# Patient Record
Sex: Female | Born: 1975 | Race: White | Hispanic: No | Smoking: Former smoker
Health system: Southern US, Community
[De-identification: ages and names within clinical notes are randomized; demographics above are authoritative.]

## PROBLEM LIST (undated history)

## (undated) DIAGNOSIS — M549 Dorsalgia, unspecified: Secondary | ICD-10-CM

## (undated) DIAGNOSIS — C801 Malignant (primary) neoplasm, unspecified: Secondary | ICD-10-CM

## (undated) DIAGNOSIS — F419 Anxiety disorder, unspecified: Secondary | ICD-10-CM

## (undated) HISTORY — PX: CHOLECYSTECTOMY: SHX55

## (undated) HISTORY — PX: ORTHOPEDIC SURGERY: SHX850

## (undated) MED FILL — Dexamethasone Sodium Phosphate Inj 100 MG/10ML: INTRAMUSCULAR | Qty: 1 | Status: AC

---

## 1998-10-20 ENCOUNTER — Emergency Department (HOSPITAL_COMMUNITY): Admission: EM | Admit: 1998-10-20 | Discharge: 1998-10-20 | Payer: Self-pay | Admitting: Emergency Medicine

## 1998-10-20 ENCOUNTER — Encounter: Payer: Self-pay | Admitting: Emergency Medicine

## 2000-11-21 ENCOUNTER — Inpatient Hospital Stay (HOSPITAL_COMMUNITY): Admission: EM | Admit: 2000-11-21 | Discharge: 2000-11-24 | Payer: Self-pay | Admitting: Family Medicine

## 2001-07-22 ENCOUNTER — Emergency Department (HOSPITAL_COMMUNITY): Admission: EM | Admit: 2001-07-22 | Discharge: 2001-07-22 | Payer: Self-pay | Admitting: Emergency Medicine

## 2001-09-01 ENCOUNTER — Encounter: Payer: Self-pay | Admitting: Internal Medicine

## 2001-09-01 ENCOUNTER — Inpatient Hospital Stay (HOSPITAL_COMMUNITY): Admission: EM | Admit: 2001-09-01 | Discharge: 2001-09-04 | Payer: Self-pay | Admitting: *Deleted

## 2001-09-02 ENCOUNTER — Encounter: Payer: Self-pay | Admitting: Family Medicine

## 2002-03-20 ENCOUNTER — Emergency Department (HOSPITAL_COMMUNITY): Admission: EM | Admit: 2002-03-20 | Discharge: 2002-03-20 | Payer: Self-pay

## 2002-07-07 ENCOUNTER — Emergency Department (HOSPITAL_COMMUNITY): Admission: EM | Admit: 2002-07-07 | Discharge: 2002-07-08 | Payer: Self-pay | Admitting: Emergency Medicine

## 2003-01-19 ENCOUNTER — Observation Stay (HOSPITAL_COMMUNITY): Admission: RE | Admit: 2003-01-19 | Discharge: 2003-01-20 | Payer: Self-pay | Admitting: Obstetrics & Gynecology

## 2003-01-20 ENCOUNTER — Encounter: Payer: Self-pay | Admitting: Obstetrics & Gynecology

## 2003-01-21 ENCOUNTER — Emergency Department (HOSPITAL_COMMUNITY): Admission: EM | Admit: 2003-01-21 | Discharge: 2003-01-21 | Payer: Self-pay | Admitting: Emergency Medicine

## 2003-01-23 ENCOUNTER — Ambulatory Visit (HOSPITAL_COMMUNITY): Admission: AD | Admit: 2003-01-23 | Discharge: 2003-01-23 | Payer: Self-pay | Admitting: Obstetrics and Gynecology

## 2003-03-01 ENCOUNTER — Ambulatory Visit (HOSPITAL_COMMUNITY): Admission: AD | Admit: 2003-03-01 | Discharge: 2003-03-01 | Payer: Self-pay | Admitting: Obstetrics and Gynecology

## 2003-03-02 ENCOUNTER — Inpatient Hospital Stay (HOSPITAL_COMMUNITY): Admission: RE | Admit: 2003-03-02 | Discharge: 2003-03-04 | Payer: Self-pay | Admitting: Obstetrics and Gynecology

## 2004-05-25 ENCOUNTER — Emergency Department (HOSPITAL_COMMUNITY): Admission: EM | Admit: 2004-05-25 | Discharge: 2004-05-25 | Payer: Self-pay | Admitting: Emergency Medicine

## 2004-05-28 ENCOUNTER — Emergency Department (HOSPITAL_COMMUNITY): Admission: EM | Admit: 2004-05-28 | Discharge: 2004-05-28 | Payer: Self-pay | Admitting: *Deleted

## 2005-01-29 ENCOUNTER — Ambulatory Visit: Payer: Self-pay | Admitting: Family Medicine

## 2005-10-19 ENCOUNTER — Emergency Department (HOSPITAL_COMMUNITY): Admission: EM | Admit: 2005-10-19 | Discharge: 2005-10-19 | Payer: Self-pay | Admitting: Emergency Medicine

## 2006-04-06 ENCOUNTER — Inpatient Hospital Stay (HOSPITAL_COMMUNITY): Admission: RE | Admit: 2006-04-06 | Discharge: 2006-04-08 | Payer: Self-pay | Admitting: Obstetrics and Gynecology

## 2006-06-02 ENCOUNTER — Emergency Department (HOSPITAL_COMMUNITY): Admission: EM | Admit: 2006-06-02 | Discharge: 2006-06-02 | Payer: Self-pay | Admitting: Emergency Medicine

## 2006-12-11 ENCOUNTER — Emergency Department (HOSPITAL_COMMUNITY): Admission: EM | Admit: 2006-12-11 | Discharge: 2006-12-11 | Payer: Self-pay | Admitting: Emergency Medicine

## 2006-12-29 ENCOUNTER — Emergency Department (HOSPITAL_COMMUNITY): Admission: EM | Admit: 2006-12-29 | Discharge: 2006-12-29 | Payer: Self-pay | Admitting: Emergency Medicine

## 2007-05-04 ENCOUNTER — Emergency Department (HOSPITAL_COMMUNITY): Admission: EM | Admit: 2007-05-04 | Discharge: 2007-05-04 | Payer: Self-pay | Admitting: Emergency Medicine

## 2007-05-05 ENCOUNTER — Emergency Department (HOSPITAL_COMMUNITY): Admission: EM | Admit: 2007-05-05 | Discharge: 2007-05-05 | Payer: Self-pay | Admitting: Emergency Medicine

## 2007-08-19 ENCOUNTER — Encounter: Payer: Self-pay | Admitting: Family Medicine

## 2008-01-16 ENCOUNTER — Emergency Department (HOSPITAL_COMMUNITY): Admission: EM | Admit: 2008-01-16 | Discharge: 2008-01-16 | Payer: Self-pay | Admitting: Emergency Medicine

## 2008-02-10 ENCOUNTER — Ambulatory Visit (HOSPITAL_COMMUNITY): Admission: RE | Admit: 2008-02-10 | Discharge: 2008-02-10 | Payer: Self-pay | Admitting: Neurology

## 2008-02-20 ENCOUNTER — Emergency Department (HOSPITAL_COMMUNITY): Admission: EM | Admit: 2008-02-20 | Discharge: 2008-02-20 | Payer: Self-pay | Admitting: Emergency Medicine

## 2008-08-13 ENCOUNTER — Emergency Department (HOSPITAL_COMMUNITY): Admission: EM | Admit: 2008-08-13 | Discharge: 2008-08-13 | Payer: Self-pay | Admitting: Emergency Medicine

## 2009-05-08 ENCOUNTER — Emergency Department (HOSPITAL_COMMUNITY): Admission: EM | Admit: 2009-05-08 | Discharge: 2009-05-08 | Payer: Self-pay | Admitting: Emergency Medicine

## 2009-07-02 ENCOUNTER — Emergency Department (HOSPITAL_COMMUNITY): Admission: EM | Admit: 2009-07-02 | Discharge: 2009-07-02 | Payer: Self-pay | Admitting: Emergency Medicine

## 2010-09-07 ENCOUNTER — Encounter: Payer: Self-pay | Admitting: Orthopedic Surgery

## 2010-09-08 ENCOUNTER — Encounter: Payer: Self-pay | Admitting: Obstetrics and Gynecology

## 2010-09-08 ENCOUNTER — Encounter: Payer: Self-pay | Admitting: Neurology

## 2010-09-17 NOTE — Letter (Signed)
Summary: rpc chart  rpc chart   Imported By: Curtis Sites 05/27/2010 11:23:27  _____________________________________________________________________  External Attachment:    Type:   Image     Comment:   External Document

## 2010-11-14 ENCOUNTER — Emergency Department (HOSPITAL_COMMUNITY)
Admission: EM | Admit: 2010-11-14 | Discharge: 2010-11-15 | Disposition: A | Discharge status: Home / Self Care | Attending: Emergency Medicine | Admitting: Emergency Medicine

## 2010-11-14 DIAGNOSIS — IMO0002 Reserved for concepts with insufficient information to code with codable children: Secondary | ICD-10-CM | POA: Insufficient documentation

## 2010-11-14 DIAGNOSIS — K08409 Partial loss of teeth, unspecified cause, unspecified class: Secondary | ICD-10-CM | POA: Insufficient documentation

## 2010-11-14 DIAGNOSIS — K08109 Complete loss of teeth, unspecified cause, unspecified class: Secondary | ICD-10-CM | POA: Insufficient documentation

## 2010-11-14 DIAGNOSIS — Y849 Medical procedure, unspecified as the cause of abnormal reaction of the patient, or of later complication, without mention of misadventure at the time of the procedure: Secondary | ICD-10-CM | POA: Insufficient documentation
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
## 2011-01-03 NOTE — Discharge Summary (Signed)
NAME:  Sara [name], Sara [name] NO.:  [id number]   MEDICAL RECORD NO.:  [id number]          PATIENT TYPE:  INP   LOCATION:  A413                          FACILITY:  [facility]   PHYSICIAN:  Tilda Burrow, M.D. DATE OF BIRTH:  1975/09/15   DATE OF ADMISSION:  04/06/2006  DATE OF DISCHARGE:  08/22/2007LH                                 DISCHARGE SUMMARY   ADMITTING DIAGNOSIS:  1. Pregnancy, term.  2. Active labor.  3. Addictive personality.  4. Positive cocaine urine drug screen.  5. Long-term use of Klonopin and Xanax (benzodiazepine).  6. Long-term use of Tylox at 90 per month.   DISCHARGE DIAGNOSES:  04/09/2006  1. Pregnancy, term, delivery.  2. Positive urine drug screen for cocaine.  3. Addictive personality.  4. Long-term use of benzodiazepines.   DISCHARGE MEDICATIONS:  1. Klonopin 1 mg twice daily times 30 days.  2. Lortab 7.5 times 30 tablets one every 6 hours p.r.n. pain.  3. Motrin 800 mg 30 tablets one every 8 hours for cramps.   FOLLOWUP:  One week Depo-Provera shot and then four weeks postpartum.  Follow up one week for tubal ligation papers.   HOSPITAL SUMMARY:  This 35 year old female, gravida 2, para 1, AB 0, is  admitted for labor management, early on August 20 at 3:49 a.m.  See Dr.  Forestine Chute notes for admitting status.   HOSPITAL COURSE:  The patient was admitted and progressed within a few hours  to delivery at 6 a.m. by Dr. Turner Daniels.   Postpartum course was notable for the fact that the urine drug screen once  again returned positive for cocaine.  She was confronted on this and social  services was involved.  Additionally, the patient has had an addictive  personality for years.  We have had multiple discussions over this during  the pregnancy.  At this point in time, we are eliminating use of oxycodone  (Tylox), which has been managed on 3 tablets 5 mg three times a day over the  past months of pregnancy.  We will prescribe NO more Tylox.  We  will manage  the discomforts of postpartum state with Motrin 800 mg three times a day 30  tablets to deal with uterine cramps and a total of 30 Lortab 7.5, which will  get her through any of the uterine cramping discomfort, postpartum.  After  that, she will only receive Motrin and no Lortab will be administered.   Additionally, we will not prescribe any more Xanax for her.  (The patient  has been on Xanax since her teens with a life-long addiction/dependency.)  We will use Klonopin 1 mg twice daily for 30 days.  Since [name] has used  Dr. Omelia Blackwater  in the past and we will send info to him and to [facility] to ahelp coordinate future medical care, with goal of reduced  drug use communicated.  Social services, of course, is involved regarding the child's care.   Additionally, the patient plans to have her tubes tied and will sign tubal  ligation papers within a week, decide  whether to have her tubes tied at one  month or six months and, if she does decide to delay, will hopefully use  Depo-Provera as interval contraception.    Addendum: Discharge delayed til 04/09/2006 due to pediatric concerns jvf      Tilda Burrow, M.D.  Electronically Signed     JVF/MEDQ  D:  04/08/2006  T:  04/08/2006  Job:  [id number]   cc:   Damita Dunnings, Dr.  Evlyn Kanner [address]  [place]

## 2011-01-03 NOTE — Op Note (Signed)
NAME:  Sara [name], Sara [name] NO.:  [id number]   MEDICAL RECORD NO.:  [id number]          PATIENT TYPE:  INP   LOCATION:  A413                          FACILITY:  [facility]   PHYSICIAN:  Lazaro Arms, M.D.   DATE OF BIRTH:  [birth year]   DATE OF PROCEDURE:  04/06/2006  DATE OF DISCHARGE:  04/08/2006                                 OPERATIVE REPORT   [name] progressed quickly to the active phase of labor.  She was positive  for cocaine.  Over an intact perineum, she delivered a viable female infant  with Apgars 9 and 9 weighing 7 pounds 2 ounces, three vessel cord, loose  nuchal cord x1.  The infant underwent routine neonatal resuscitation.  Cord  blood and cord gas sent.  Placenta was delivered normal and intact.  The  uterus was firm below the umbilicus.  There was 350 mL of bleeding with the  delivery.  The epidural catheter was removed intact without difficulty.  The  patient will undergo routine postpartum care.      Lazaro Arms, M.D.  Electronically Signed     LHE/MEDQ  D:  04/30/2006  T:  04/30/2006  Job:  [id number]

## 2011-01-03 NOTE — Op Note (Signed)
   NAME:  Sara [name], Sara [name] NO.:  [id number]   MEDICAL RECORD NO.:  [id number]                  PATIENT TYPE:   LOCATION:                                       FACILITY:   PHYSICIAN:  Jacklyn Shell, C.N.M.    DATE OF BIRTH:   DATE OF PROCEDURE:  DATE OF DISCHARGE:                                 OPERATIVE REPORT   DELIVERY NOTE:  After a small amount of Pitocin augmentation [name] was  noted to be fully dilated with an urge to push.  After an approximate hour  second stage she delivered a viable female infant at 1350.  The mouth and  noses were suctioned on the perineum with a bulb syringe and the body  delivered easily over the next push.  Weight is 6 pounds 9 ounces; Apgars  are 8 and 9.   The placenta separated spontaneously and was delivered by a controlled cord  traction at 1352.  Twenty units of Pitocin diluted in 1000 cc of lactated  Ringers was infused rapidly IV.  The fundus was immediately firm and minimal  blood loss was noted.  The vagina was then inspected and no lacerations were  found.  The epidural catheter was then removed with the blue tip being  visualized as intact. Estimated blood loss 200 cc.                                               Jacklyn Shell, C.N.M.    FC/MEDQ  D:  03/02/2003  T:  03/02/2003  Job:  [id number]   cc:   [facility]

## 2011-01-03 NOTE — Op Note (Signed)
NAME:  Sara [name], Sara [name] NO.:  [id number]   MEDICAL RECORD NO.:  [id number]          PATIENT TYPE:  INP   LOCATION:  A413                          FACILITY:  [facility]   PHYSICIAN:  Lazaro Arms, M.D.   DATE OF BIRTH:  1975-10-31   DATE OF PROCEDURE:  04/06/2006  DATE OF DISCHARGE:  04/08/2006                                 OPERATIVE REPORT   PROCEDURE:  Epidural.   [name] was admitted in the active phase of labor requesting epidural be  placed.  She was placed in the sitting position, Betadine prep was used.  1%  lidocaine was injected in the L3-L4 interspace.  The area was draped.  A 17  gauge Tuohy needle was used.  Loss of resistance technique employed.  The  epidural space was found on one pass without difficulty.  10 mL of 0.125%  Bupivacaine plain was given to the epidural space without difficulty.  The  epidural catheter was fit 5 cm in the epidural space, an additional 10 mL  was given, this was taped down and continuous infusion begun at 12 mL per  hour.  The patient tolerated the procedure well.  She is getting good pain  relief.  No adverse reaction.      Lazaro Arms, M.D.  Electronically Signed     LHE/MEDQ  D:  04/30/2006  T:  04/30/2006  Job:  [id number]
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
## 2011-01-03 NOTE — Op Note (Signed)
   NAME:  Sara [name], Sara [name]                         ACCOUNT NO.:  [id number]   MEDICAL RECORD NO.:  [id number]                   PATIENT TYPE:  INP   LOCATION:  A418                                 FACILITY:  [facility]   PHYSICIAN:  Tilda Burrow, M.D.              DATE OF BIRTH:  31-May-1976   DATE OF PROCEDURE:  03/02/2003  DATE OF DISCHARGE:                                 OPERATIVE REPORT   PROCEDURE:  Epidural catheter placement.   INDICATIONS FOR PROCEDURE:  The patient requesting epidural for labor  management.   SURGEON:  Lazaro Arms, M.D.   TIME:  03/02/2003, 7 a.m.   DESCRIPTION OF PROCEDURE:  The patient was placed in a sitting position  flexed forward and epidural catheter placed using loss of resistance  technique at the L3-4 interspace.  She received an initial 10 cc test dose  of bupivacaine 0.25% followed by subsequent dose similar.  The initial bolus  was 7 cc followed by continuous infusion at 12 cc per hour.  The patient did  well, with stable blood pressures.  The catheter was taped to her back and  was allowed to proceed without difficulty.                                                 Tilda Burrow, M.D.    JVF/MEDQ  D:  03/14/2003  T:  03/15/2003  Job:  [id number]
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
## 2011-01-10 ENCOUNTER — Emergency Department (HOSPITAL_COMMUNITY)
Admission: EM | Admit: 2011-01-10 | Discharge: 2011-01-11 | Disposition: A | Discharge status: Home / Self Care | Attending: Emergency Medicine | Admitting: Emergency Medicine

## 2011-01-10 DIAGNOSIS — F411 Generalized anxiety disorder: Secondary | ICD-10-CM | POA: Insufficient documentation

## 2011-01-10 DIAGNOSIS — F329 Major depressive disorder, single episode, unspecified: Secondary | ICD-10-CM | POA: Insufficient documentation

## 2011-01-10 DIAGNOSIS — K029 Dental caries, unspecified: Secondary | ICD-10-CM | POA: Insufficient documentation

## 2011-01-10 DIAGNOSIS — S025XXA Fracture of tooth (traumatic), initial encounter for closed fracture: Secondary | ICD-10-CM | POA: Insufficient documentation

## 2011-01-10 DIAGNOSIS — F3289 Other specified depressive episodes: Secondary | ICD-10-CM | POA: Insufficient documentation

## 2011-01-10 DIAGNOSIS — X58XXXA Exposure to other specified factors, initial encounter: Secondary | ICD-10-CM | POA: Insufficient documentation

## 2011-05-02 ENCOUNTER — Emergency Department (HOSPITAL_COMMUNITY)
Admission: EM | Admit: 2011-05-02 | Discharge: 2011-05-02 | Disposition: A | Discharge status: Home / Self Care | Attending: Emergency Medicine | Admitting: Emergency Medicine

## 2011-05-02 ENCOUNTER — Encounter: Payer: Self-pay | Admitting: *Deleted

## 2011-05-02 DIAGNOSIS — M62838 Other muscle spasm: Secondary | ICD-10-CM | POA: Insufficient documentation

## 2011-05-02 DIAGNOSIS — M549 Dorsalgia, unspecified: Secondary | ICD-10-CM | POA: Insufficient documentation

## 2011-05-02 DIAGNOSIS — IMO0002 Reserved for concepts with insufficient information to code with codable children: Secondary | ICD-10-CM | POA: Insufficient documentation

## 2011-05-02 DIAGNOSIS — Y92009 Unspecified place in unspecified non-institutional (private) residence as the place of occurrence of the external cause: Secondary | ICD-10-CM | POA: Insufficient documentation

## 2011-05-02 HISTORY — DX: Dorsalgia, unspecified: M54.9

## 2011-05-02 MED ORDER — OXYCODONE-ACETAMINOPHEN 5-325 MG PO TABS: 1.0000 | ORAL_TABLET | Freq: Once | ORAL | Status: DC

## 2011-05-02 MED ORDER — CYCLOBENZAPRINE HCL 10 MG PO TABS
10.0000 mg | ORAL_TABLET | Freq: Once | ORAL | Status: AC
  Administered 2011-05-02: 10 mg via ORAL
  Filled 2011-05-02: qty 1

## 2011-05-02 MED ORDER — OXYCODONE-ACETAMINOPHEN 5-325 MG PO TABS
1.0000 | ORAL_TABLET | Freq: Once | ORAL | Status: AC
  Administered 2011-05-02: 1 via ORAL
  Filled 2011-05-02: qty 1

## 2011-05-02 MED ORDER — OXYCODONE-ACETAMINOPHEN 7.5-325 MG PO TABS: ORAL_TABLET | ORAL | Status: DC

## 2011-05-02 MED ORDER — CYCLOBENZAPRINE HCL 10 MG PO TABS: ORAL_TABLET | ORAL | Status: DC
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
## 2011-05-02 NOTE — ED Notes (Signed)
States she fell last night in the bathtub, has a history of back pain
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
## 2011-05-03 NOTE — ED Provider Notes (Signed)
Medical screening examination/treatment/procedure(s) were performed by non-physician practitioner and as supervising physician I was immediately available for consultation/collaboration.  Chawn Spraggins, MD 05/03/11 0008 

## 2011-05-15 ENCOUNTER — Encounter (HOSPITAL_COMMUNITY): Payer: Self-pay | Admitting: *Deleted

## 2011-05-15 ENCOUNTER — Emergency Department (HOSPITAL_COMMUNITY)
Admission: EM | Admit: 2011-05-15 | Discharge: 2011-05-15 | Disposition: A | Discharge status: Home / Self Care | Attending: Emergency Medicine | Admitting: Emergency Medicine

## 2011-05-15 DIAGNOSIS — R070 Pain in throat: Secondary | ICD-10-CM | POA: Insufficient documentation

## 2011-05-15 DIAGNOSIS — J3489 Other specified disorders of nose and nasal sinuses: Secondary | ICD-10-CM | POA: Insufficient documentation

## 2011-05-15 DIAGNOSIS — M545 Low back pain: Secondary | ICD-10-CM

## 2011-05-15 DIAGNOSIS — R509 Fever, unspecified: Secondary | ICD-10-CM | POA: Insufficient documentation

## 2011-05-15 DIAGNOSIS — K029 Dental caries, unspecified: Secondary | ICD-10-CM | POA: Insufficient documentation

## 2011-05-15 DIAGNOSIS — M549 Dorsalgia, unspecified: Secondary | ICD-10-CM | POA: Insufficient documentation

## 2011-05-15 DIAGNOSIS — K0889 Other specified disorders of teeth and supporting structures: Secondary | ICD-10-CM

## 2011-05-15 DIAGNOSIS — K089 Disorder of teeth and supporting structures, unspecified: Secondary | ICD-10-CM | POA: Insufficient documentation

## 2011-05-15 DIAGNOSIS — J029 Acute pharyngitis, unspecified: Secondary | ICD-10-CM

## 2011-05-15 DIAGNOSIS — F17200 Nicotine dependence, unspecified, uncomplicated: Secondary | ICD-10-CM | POA: Insufficient documentation

## 2011-05-15 MED ORDER — PROMETHAZINE-DM 6.25-15 MG/5ML PO SYRP: 5.0000 mL | ORAL_SOLUTION | Freq: Four times a day (QID) | ORAL | Status: AC | PRN

## 2011-05-15 MED ORDER — AMOXICILLIN 500 MG PO CAPS: 500.0000 mg | ORAL_CAPSULE | Freq: Three times a day (TID) | ORAL | Status: AC

## 2011-05-15 MED ORDER — OXYCODONE-ACETAMINOPHEN 5-325 MG PO TABS
1.0000 | ORAL_TABLET | Freq: Once | ORAL | Status: AC
  Administered 2011-05-15: 1 via ORAL
  Filled 2011-05-15: qty 1

## 2011-05-15 MED ORDER — OXYCODONE-ACETAMINOPHEN 5-325 MG PO TABS: 1.0000 | ORAL_TABLET | ORAL | Status: AC | PRN

## 2011-05-15 NOTE — ED Provider Notes (Signed)
History     CSN: [id number] Arrival date & time: 05/15/2011  4:44 PM  Chief Complaint  Patient presents with  . Sore Throat  . Dental Pain    wisdom teeth bottom bilateral    (Consider location/radiation/quality/duration/timing/severity/associated sxs/prior treatment) Patient is a 35 y.o. female presenting with pharyngitis and tooth pain. The history is provided by the patient.  Sore Throat This is a new problem. The current episode started in the past 7 days. The problem occurs constantly. The problem has been unchanged. Associated symptoms include congestion, coughing, a fever and a sore throat. Pertinent negatives include no abdominal pain, arthralgias, chest pain, headaches, joint swelling, nausea, neck pain, numbness, rash or weakness. Associated symptoms comments: Nasal congestion with purulent nasal discharge.  She also reports chronic low back pain and is getting established with a pain clinic,  But will not be seen until 3 weeks from now.  Cough has been nonproductive.  She feels achy allover,  Denies sob.  No complaint of lower extremity weakness or numbness.  She also complains of dental pain,  Has decay in her bilateral lower wisdom teeth with increased pain and swelling on the right side of her mouth.. The symptoms are aggravated by nothing.  Dental PainThe primary symptoms include fever, sore throat and cough. Primary symptoms do not include headaches or shortness of breath.    Past Medical History  Diagnosis Date  . Back pain     Past Surgical History  Procedure Date  . Orthopedic surgery   . Cholecystectomy     History reviewed. No pertinent family history.  History  Substance Use Topics  . Smoking status: Current Everyday Smoker  . Smokeless tobacco: Not on file  . Alcohol Use: No    OB History    Grav Para Term Preterm Abortions TAB SAB Ect Mult Living                  Review of Systems  Constitutional: Positive for fever.  HENT: Positive for  congestion, sore throat, rhinorrhea and dental problem. Negative for neck pain and sinus pressure.   Eyes: Negative.   Respiratory: Positive for cough. Negative for chest tightness and shortness of breath.   Cardiovascular: Negative for chest pain.  Gastrointestinal: Negative for nausea and abdominal pain.  Genitourinary: Negative.   Musculoskeletal: Positive for back pain. Negative for joint swelling and arthralgias.  Skin: Negative.  Negative for rash and wound.  Neurological: Negative for dizziness, weakness, light-headedness, numbness and headaches.  Hematological: Negative.   Psychiatric/Behavioral: Negative.     Allergies  Hydrocodone; Sulfa antibiotics; and Morphine and related  Home Medications   Current Outpatient Rx  Name Route Sig Dispense Refill  . CYCLOBENZAPRINE HCL 10 MG PO TABS  1/2 to 1 tab po TID for muscle spasms. 30 tablet 0  . IBUPROFEN 800 MG PO TABS Oral Take 800 mg by mouth daily as needed. For pain     . ONE-DAILY MULTI VITAMINS PO TABS Oral Take 1 tablet by mouth daily.      . OXYCODONE-ACETAMINOPHEN 7.5-325 MG PO TABS  1 po q 6 hrs prn pain 30 tablet 0    BP 104/67  Pulse 95  Temp(Src) 98.4 F (36.9 C) (Oral)  Resp 20  Ht 5\' 4"  (1.626 m)  Wt 121 lb (54.885 kg)  BMI 20.77 kg/m2  SpO2 100%  LMP 04/27/2011  Physical Exam  Constitutional: She is oriented to person, place, and time. She appears well-developed and well-nourished. No distress.  HENT:  Head: Normocephalic and atraumatic.  Right Ear: Tympanic membrane and external ear normal.  Left Ear: Tympanic membrane and external ear normal.  Mouth/Throat: Mucous membranes are normal. No oral lesions. Dental caries present. Posterior oropharyngeal erythema present. No oropharyngeal exudate or tonsillar abscesses.         Bilateral lower 3rd molar decay with surrounding gingival erythema of the right molar.  No fluctuance.  Eyes: Conjunctivae are normal.  Neck: Normal range of motion. Neck supple.    Cardiovascular: Normal rate, regular rhythm, normal heart sounds and intact distal pulses.        Pedal pulses normal.  Pulmonary/Chest: Effort normal. She has no wheezes.  Abdominal: Soft. Bowel sounds are normal. She exhibits no distension and no mass.  Musculoskeletal: Normal range of motion. She exhibits no edema.       Lumbar back: She exhibits tenderness. She exhibits no swelling, no edema and no spasm.  Lymphadenopathy:    She has no cervical adenopathy.  Neurological: She is alert and oriented to person, place, and time. She has normal strength. She displays no atrophy and no tremor. No cranial nerve deficit or sensory deficit. Gait normal.  Reflex Scores:      Patellar reflexes are 2+ on the right side and 2+ on the left side.      Achilles reflexes are 2+ on the right side and 2+ on the left side.      No strength deficit noted in hip and knee flexor and extensor muscle groups.  Ankle flexion and extension intact.  Skin: Skin is warm and dry. No erythema.  Psychiatric: She has a normal mood and affect.    ED Course  Procedures (including critical care time)   Labs Reviewed  RAPID STREP SCREEN   No results found.   No diagnosis found.    MDM  Dental pain with decay,  probably early abscess right lower 3rd molar,  Covered with abx.          Candis Musa, PA 05/15/11 1802

## 2011-05-15 NOTE — ED Provider Notes (Signed)
Evaluation and management procedures were performed by the mid-level provider (PA/NP/CNM) under my supervision/collaboration. I was present and available during the ED course. Keyontae Huckeby Y.   Gavin Pound. Oletta Lamas, MD 05/15/11 [phone]

## 2011-05-15 NOTE — ED Notes (Signed)
Pt c/o sore throat and bilateral wisdom tooth pain

## 2011-05-15 NOTE — ED Notes (Signed)
Pt out at desk asking for percocet's, 7.5mg  and 30 of them. States does not want to withdrawal until she gets to the pain clinic.

## 2011-05-29 LAB — CULTURE, ROUTINE-ABSCESS

## 2011-06-22 ENCOUNTER — Encounter (HOSPITAL_COMMUNITY): Payer: Self-pay

## 2011-06-22 ENCOUNTER — Emergency Department (HOSPITAL_COMMUNITY)
Admission: EM | Admit: 2011-06-22 | Discharge: 2011-06-22 | Disposition: A | Discharge status: Home / Self Care | Attending: Emergency Medicine | Admitting: Emergency Medicine

## 2011-06-22 DIAGNOSIS — K0889 Other specified disorders of teeth and supporting structures: Secondary | ICD-10-CM

## 2011-06-22 DIAGNOSIS — K029 Dental caries, unspecified: Secondary | ICD-10-CM | POA: Insufficient documentation

## 2011-06-22 DIAGNOSIS — F17200 Nicotine dependence, unspecified, uncomplicated: Secondary | ICD-10-CM | POA: Insufficient documentation

## 2011-06-22 DIAGNOSIS — K089 Disorder of teeth and supporting structures, unspecified: Secondary | ICD-10-CM | POA: Insufficient documentation

## 2011-06-22 MED ORDER — OXYCODONE-ACETAMINOPHEN 5-325 MG PO TABS: 1.0000 | ORAL_TABLET | ORAL | Status: AC | PRN

## 2011-06-22 MED ORDER — PENICILLIN V POTASSIUM 250 MG PO TABS
500.0000 mg | ORAL_TABLET | Freq: Once | ORAL | Status: AC
  Administered 2011-06-22: 500 mg via ORAL
  Filled 2011-06-22: qty 2

## 2011-06-22 MED ORDER — PENICILLIN V POTASSIUM 500 MG PO TABS: 500.0000 mg | ORAL_TABLET | Freq: Four times a day (QID) | ORAL | Status: AC

## 2011-06-22 MED ORDER — OXYCODONE-ACETAMINOPHEN 5-325 MG PO TABS
1.0000 | ORAL_TABLET | Freq: Once | ORAL | Status: AC
  Administered 2011-06-22: 1 via ORAL
  Filled 2011-06-22: qty 1

## 2011-06-22 NOTE — ED Provider Notes (Signed)
Medical screening examination/treatment/procedure(s) were performed by non-physician practitioner and as supervising physician I was immediately available for consultation/collaboration.   Glynn Octave, MD 06/22/11 2329

## 2011-06-22 NOTE — ED Notes (Signed)
Pt presents with lower wisdom teeth pain. Pt states pain is intermittent for 1 month.

## 2011-06-22 NOTE — ED Notes (Signed)
Pt states was eating lunch when she "stuck a plastic fork in tooth ( has 2 lower back molar caries) to get food particle from cavity" " I dumped 2 bc powders in teeth, 1 in both sides".

## 2011-06-22 NOTE — ED Provider Notes (Signed)
History     CSN: [id number] Arrival date & time: 06/22/2011  3:40 PM   First MD Initiated Contact with Patient 06/22/11 1610      Chief Complaint  Patient presents with  . Dental Pain    (Consider location/radiation/quality/duration/timing/severity/associated sxs/prior treatment) Patient is a 34 y.o. female presenting with tooth pain. The history is provided by the patient.  Dental PainThe primary symptoms include mouth pain. Primary symptoms do not include oral bleeding, oral lesions, headaches, fever, shortness of breath, sore throat, angioedema or cough. The symptoms began more than 1 week ago. The symptoms are waxing and waning. The symptoms are chronic. The symptoms occur frequently.  Mouth pain began more than 1 week ago. Mouth pain occurs frequently. Mouth pain is worsening. Affected locations include: teeth. The mouth pain is currently at 9/10.  Additional symptoms include: dental sensitivity to temperature, jaw pain and facial swelling. Additional symptoms do not include: gum swelling, gum tenderness, purulent gums, trismus, trouble swallowing, pain with swallowing, ear pain, swollen glands and fatigue. Medical issues include: periodontal disease.    Past Medical History  Diagnosis Date  . Back pain     Past Surgical History  Procedure Date  . Orthopedic surgery   . Cholecystectomy     History reviewed. No pertinent family history.  History  Substance Use Topics  . Smoking status: Current Everyday Smoker -- 0.2 packs/day  . Smokeless tobacco: Not on file  . Alcohol Use: No    OB History    Grav Para Term Preterm Abortions TAB SAB Ect Mult Living                  Review of Systems  Constitutional: Negative for fever, chills and fatigue.  HENT: Positive for facial swelling. Negative for ear pain, sore throat, trouble swallowing, neck pain and neck stiffness.   Eyes: Negative for photophobia and pain.  Respiratory: Negative for cough, shortness of breath and  wheezing.   Cardiovascular: Negative for chest pain and palpitations.  Gastrointestinal: Negative for nausea, vomiting and abdominal pain.  Musculoskeletal: Negative for myalgias and back pain.  Skin: Negative for rash.  Neurological: Negative for dizziness, weakness, numbness and headaches.  Hematological: Does not bruise/bleed easily.  All other systems reviewed and are negative.    Allergies  Hydrocodone; Sulfa antibiotics; and Morphine and related  Home Medications   Current Outpatient Rx  Name Route Sig Dispense Refill  . CYCLOBENZAPRINE HCL 10 MG PO TABS  1/2 to 1 tab po TID for muscle spasms. 30 tablet 0  . IBUPROFEN 800 MG PO TABS Oral Take 800 mg by mouth daily as needed. For pain     . ONE-DAILY MULTI VITAMINS PO TABS Oral Take 1 tablet by mouth daily.      . OXYCODONE-ACETAMINOPHEN 7.5-325 MG PO TABS  1 po q 6 hrs prn pain 30 tablet 0    BP 137/77  Pulse 91  Temp(Src) 98.5 F (36.9 C) (Oral)  Resp 16  Ht 5\' 4"  (1.626 m)  Wt 126 lb (57.153 kg)  BMI 21.63 kg/m2  SpO2 100%  LMP 05/24/2011  Physical Exam  Nursing note and vitals reviewed. Constitutional: She is oriented to person, place, and time. She appears well-developed and well-nourished. No distress.  HENT:  Head: Normocephalic and atraumatic. No trismus in the jaw.  Mouth/Throat: Uvula is midline, oropharynx is clear and moist and mucous membranes are normal. Dental caries present. No dental abscesses or uvula swelling.    Eyes: EOM are  normal. Pupils are equal, round, and reactive to light.  Neck: Normal range of motion. Neck supple. No JVD present.  Cardiovascular: Normal rate, regular rhythm and normal heart sounds.   No murmur heard. Pulmonary/Chest: Effort normal and breath sounds normal. No respiratory distress. She exhibits no tenderness.  Abdominal: Soft. She exhibits no distension. There is no tenderness.  Musculoskeletal: Normal range of motion. She exhibits no tenderness.  Lymphadenopathy:     She has no cervical adenopathy.  Neurological: She is alert and oriented to person, place, and time. No cranial nerve deficit. She exhibits normal muscle tone. Coordination normal.  Skin: Skin is warm and dry.  Psychiatric: She has a normal mood and affect.    ED Course  Procedures (including critical care time)       MDM    4:30 PM 06/22/2011   Multiple dental caries of the bilateral second and third molars.  No erythema or swelling of the gums.  No trismus.    06/22/2011 Patient / Family / Caregiver understand and agree with initial ED impression and plan with expectations set for ED visit.   Pt feels improved after observation and/or treatment in ED.     Jiovani Mccammon L. Davonn Flanery, [place] 06/22/11 1650

## 2011-06-22 NOTE — ED Notes (Signed)
Pt asked abruptly upon discharge teaching instructions; "what did she give me? Well what is the dose? And how many?" When all the questions were answered she sharply stated " well the first 5 percocet didn't work maybe I should have chewed it or better yet snorted it" Pt became very short and then switched very quickly to "well the phone doesn't work and I need to get back to church,after all the $ I spend here you'd think the phone would work!" Rn attempted to console pt without success. Pt just grabbed papers and left after e signature was signed.

## 2011-08-25 ENCOUNTER — Emergency Department (HOSPITAL_COMMUNITY)
Admission: EM | Admit: 2011-08-25 | Discharge: 2011-08-25 | Disposition: A | Discharge status: Home / Self Care | Attending: Emergency Medicine | Admitting: Emergency Medicine

## 2011-08-25 ENCOUNTER — Encounter (HOSPITAL_COMMUNITY): Payer: Self-pay | Admitting: *Deleted

## 2011-08-25 DIAGNOSIS — K029 Dental caries, unspecified: Secondary | ICD-10-CM | POA: Insufficient documentation

## 2011-08-25 DIAGNOSIS — M549 Dorsalgia, unspecified: Secondary | ICD-10-CM

## 2011-08-25 DIAGNOSIS — K089 Disorder of teeth and supporting structures, unspecified: Secondary | ICD-10-CM | POA: Insufficient documentation

## 2011-08-25 DIAGNOSIS — K0889 Other specified disorders of teeth and supporting structures: Secondary | ICD-10-CM

## 2011-08-25 DIAGNOSIS — M545 Low back pain, unspecified: Secondary | ICD-10-CM | POA: Insufficient documentation

## 2011-08-25 DIAGNOSIS — Z79899 Other long term (current) drug therapy: Secondary | ICD-10-CM | POA: Insufficient documentation

## 2011-08-25 DIAGNOSIS — R220 Localized swelling, mass and lump, head: Secondary | ICD-10-CM | POA: Insufficient documentation

## 2011-08-25 DIAGNOSIS — F17200 Nicotine dependence, unspecified, uncomplicated: Secondary | ICD-10-CM | POA: Insufficient documentation

## 2011-08-25 MED ORDER — PENICILLIN V POTASSIUM 500 MG PO TABS: 500.0000 mg | ORAL_TABLET | Freq: Four times a day (QID) | ORAL | Status: AC

## 2011-08-25 MED ORDER — OXYCODONE-ACETAMINOPHEN 5-325 MG PO TABS: 1.0000 | ORAL_TABLET | ORAL | Status: AC | PRN

## 2011-08-25 NOTE — ED Notes (Signed)
Toothache onset last night, also has back pain

## 2011-08-25 NOTE — ED Notes (Signed)
Tammy, PA in prior to RN, see PA assessment for further 

## 2011-08-25 NOTE — ED Provider Notes (Signed)
History     CSN: [id number]  Arrival date & time 08/25/11  1534   First MD Initiated Contact with Patient 08/25/11 1600      Chief Complaint  Patient presents with  . Dental Injury    (Consider location/radiation/quality/duration/timing/severity/associated sxs/prior treatment) HPI Comments: Patient c/o bilateral toothache for approximately 24 hrs.  Has hx of frequent ED visits for dental pain.  She also c/o lower back pain which is chronic.  She denies incontinence of urine or bowel, abd pain, numbness, weakness or leg pain.   Patient is a 36 y.o. female presenting with tooth pain. The history is provided by the patient.  Dental PainThe primary symptoms include mouth pain. Primary symptoms do not include dental injury, oral bleeding, fever, shortness of breath, sore throat, angioedema or cough. The symptoms began 12 to 24 hours ago. The symptoms are unchanged. The symptoms are recurrent. The symptoms occur constantly.  Affected locations include: teeth and gum(s). The mouth pain is currently at 10/10.  Additional symptoms include: dental sensitivity to temperature, gum swelling and gum tenderness. Additional symptoms do not include: purulent gums, trismus, jaw pain, facial swelling, trouble swallowing and swollen glands. Medical issues include: smoking and periodontal disease.    Past Medical History  Diagnosis Date  . Back pain     Past Surgical History  Procedure Date  . Orthopedic surgery   . Cholecystectomy     History reviewed. No pertinent family history.  History  Substance Use Topics  . Smoking status: Current Everyday Smoker -- 0.2 packs/day  . Smokeless tobacco: Not on file  . Alcohol Use: No    OB History    Grav Para Term Preterm Abortions TAB SAB Ect Mult Living                  Review of Systems  Constitutional: Negative for fever.  HENT: Positive for dental problem. Negative for sore throat, facial swelling, trouble swallowing, neck pain and neck  stiffness.   Respiratory: Negative for cough and shortness of breath.   Genitourinary: Negative for dysuria and difficulty urinating.  Musculoskeletal: Positive for back pain. Negative for joint swelling.  Skin: Negative.   Neurological: Negative for weakness and numbness.  All other systems reviewed and are negative.    Allergies  Hydrocodone; Sulfa antibiotics; and Morphine and related  Home Medications   Current Outpatient Rx  Name Route Sig Dispense Refill  . CYCLOBENZAPRINE HCL 10 MG PO TABS  1/2 to 1 tab po TID for muscle spasms. 30 tablet 0  . IBUPROFEN 800 MG PO TABS Oral Take 800 mg by mouth daily as needed. For pain     . ONE-DAILY MULTI VITAMINS PO TABS Oral Take 1 tablet by mouth daily.      . OXYCODONE-ACETAMINOPHEN 7.5-325 MG PO TABS  1 po q 6 hrs prn pain 30 tablet 0    BP 132/91  Pulse 102  Temp(Src) 99 F (37.2 C) (Oral)  Resp 22  Ht 5\' 4"  (1.626 m)  Wt 130 lb (58.968 kg)  BMI 22.31 kg/m2  SpO2 100%  LMP 08/01/2011  Physical Exam  Nursing note and vitals reviewed. Constitutional: She is oriented to person, place, and time. She appears well-developed and well-nourished. No distress.  HENT:  Head: Normocephalic and atraumatic. No trismus in the jaw.  Mouth/Throat: Uvula is midline and oropharynx is clear and moist. Dental caries present. No uvula swelling.         Dental caries w/o abscess  Neck:  Normal range of motion.  Cardiovascular: Normal rate, regular rhythm and normal heart sounds.   Pulmonary/Chest: Effort normal and breath sounds normal.  Musculoskeletal: Normal range of motion. She exhibits tenderness. She exhibits no edema.       Lumbar back: She exhibits tenderness. She exhibits normal range of motion, no swelling, no edema, no deformity, no laceration, no spasm and normal pulse.       Back:       ttp of the lumbar paraspinal muscles.  No bony tenderness  Lymphadenopathy:    She has no cervical adenopathy.  Neurological: She is alert and  oriented to person, place, and time. No cranial nerve deficit or sensory deficit. She exhibits normal muscle tone. Coordination normal.  Reflex Scores:      Patellar reflexes are 2+ on the right side and 2+ on the left side.      Achilles reflexes are 2+ on the right side and 2+ on the left side. Skin: Skin is warm and dry.    ED Course  Procedures (including critical care time)       MDM   Bilateral dental caries of the lower second and third molars.  Mild erythema of the surrounding gums.  No obvious dental abscess        Daphine Loch L. Taeler Winning, [place] 08/27/11 1327

## 2011-08-28 NOTE — ED Provider Notes (Signed)
Medical screening examination/treatment/procedure(s) were performed by non-physician practitioner and as supervising physician I was immediately available for consultation/collaboration.  Tiah Heckel K Linker, MD 08/28/11 1316 

## 2011-10-16 ENCOUNTER — Encounter (HOSPITAL_COMMUNITY): Payer: Self-pay | Admitting: Emergency Medicine

## 2011-10-16 ENCOUNTER — Emergency Department (HOSPITAL_COMMUNITY)

## 2011-10-16 ENCOUNTER — Emergency Department (HOSPITAL_COMMUNITY)
Admission: EM | Admit: 2011-10-16 | Discharge: 2011-10-17 | Disposition: A | Discharge status: Home / Self Care | Attending: Emergency Medicine | Admitting: Emergency Medicine

## 2011-10-16 DIAGNOSIS — M545 Low back pain, unspecified: Secondary | ICD-10-CM | POA: Insufficient documentation

## 2011-10-16 DIAGNOSIS — S32009A Unspecified fracture of unspecified lumbar vertebra, initial encounter for closed fracture: Secondary | ICD-10-CM | POA: Insufficient documentation

## 2011-10-16 DIAGNOSIS — M533 Sacrococcygeal disorders, not elsewhere classified: Secondary | ICD-10-CM | POA: Insufficient documentation

## 2011-10-16 DIAGNOSIS — IMO0002 Reserved for concepts with insufficient information to code with codable children: Secondary | ICD-10-CM

## 2011-10-16 DIAGNOSIS — M79609 Pain in unspecified limb: Secondary | ICD-10-CM | POA: Insufficient documentation

## 2011-10-16 DIAGNOSIS — R209 Unspecified disturbances of skin sensation: Secondary | ICD-10-CM | POA: Insufficient documentation

## 2011-10-16 MED ORDER — ONDANSETRON HCL 4 MG PO TABS
4.0000 mg | ORAL_TABLET | Freq: Once | ORAL | Status: AC
  Administered 2011-10-16: 4 mg via ORAL
  Filled 2011-10-16: qty 1

## 2011-10-16 MED ORDER — MORPHINE SULFATE 4 MG/ML IJ SOLN
8.0000 mg | Freq: Once | INTRAMUSCULAR | Status: DC
  Filled 2011-10-16: qty 2

## 2011-10-16 MED ORDER — FENTANYL CITRATE 0.05 MG/ML IJ SOLN
50.0000 ug | Freq: Once | INTRAMUSCULAR | Status: AC
  Administered 2011-10-16: 50 ug via INTRAMUSCULAR
  Filled 2011-10-16: qty 2

## 2011-10-16 MED ORDER — DIPHENHYDRAMINE HCL 12.5 MG/5ML PO ELIX
12.5000 mg | ORAL_SOLUTION | Freq: Once | ORAL | Status: AC
  Administered 2011-10-16: 12.5 mg via ORAL
  Filled 2011-10-16: qty 5

## 2011-10-16 MED ORDER — METHOCARBAMOL 500 MG PO TABS
1000.0000 mg | ORAL_TABLET | Freq: Once | ORAL | Status: AC
  Administered 2011-10-16: 1000 mg via ORAL
  Filled 2011-10-16: qty 1
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
## 2011-10-16 NOTE — ED Notes (Signed)
Patient transported to X-ray 

## 2011-10-17 MED ORDER — OXYCODONE-ACETAMINOPHEN 5-325 MG PO TABS
2.0000 | ORAL_TABLET | Freq: Once | ORAL | Status: AC
  Administered 2011-10-17: 2 via ORAL
  Filled 2011-10-17: qty 2

## 2011-10-17 MED ORDER — MELOXICAM 7.5 MG PO TABS: ORAL_TABLET | ORAL | Status: DC

## 2011-10-17 MED ORDER — OXYCODONE HCL 15 MG PO TABS: ORAL_TABLET | ORAL | Status: DC

## 2011-10-17 NOTE — ED Notes (Signed)
Paged Neuro Surgeon through care link.

## 2011-10-17 NOTE — ED Provider Notes (Signed)
Medical screening examination/treatment/procedure(s) were conducted as a shared visit with non-physician practitioner(s) and myself.  I personally evaluated the patient during the encounter  Nicoletta Dress. Colon Branch, MD 10/17/11 4098

## 2011-10-17 NOTE — Discharge Instructions (Signed)
Your x-ray reveals the coccyx bone to be within normal limits. Your lumbar spine x-ray reveals spinous process fractures at the L2, L3, and L4 areas. The neurosurgeon listed above requested to use the lumbar corset over the next month. And if you're continuing to have problems after that time to call to make an appointment. Please see your [facility] physician for assistance with pain management if needed. The lumbar corset has been called in to [facility] here in [place]. Please go by at your earliest convenience to be fitted with this appliance.
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
## 2014-06-13 ENCOUNTER — Emergency Department (HOSPITAL_COMMUNITY)
Admission: EM | Admit: 2014-06-13 | Discharge: 2014-06-13 | Disposition: A | Discharge status: Home / Self Care | Attending: Emergency Medicine | Admitting: Emergency Medicine

## 2014-06-13 ENCOUNTER — Encounter (HOSPITAL_COMMUNITY): Payer: Self-pay | Admitting: Emergency Medicine

## 2014-06-13 ENCOUNTER — Emergency Department (HOSPITAL_COMMUNITY)

## 2014-06-13 DIAGNOSIS — Z79899 Other long term (current) drug therapy: Secondary | ICD-10-CM | POA: Insufficient documentation

## 2014-06-13 DIAGNOSIS — Y9389 Activity, other specified: Secondary | ICD-10-CM | POA: Insufficient documentation

## 2014-06-13 DIAGNOSIS — M546 Pain in thoracic spine: Secondary | ICD-10-CM

## 2014-06-13 DIAGNOSIS — S199XXA Unspecified injury of neck, initial encounter: Secondary | ICD-10-CM | POA: Insufficient documentation

## 2014-06-13 DIAGNOSIS — R52 Pain, unspecified: Secondary | ICD-10-CM

## 2014-06-13 DIAGNOSIS — Z87891 Personal history of nicotine dependence: Secondary | ICD-10-CM | POA: Insufficient documentation

## 2014-06-13 DIAGNOSIS — Y92410 Unspecified street and highway as the place of occurrence of the external cause: Secondary | ICD-10-CM | POA: Insufficient documentation

## 2014-06-13 DIAGNOSIS — S3982XA Other specified injuries of lower back, initial encounter: Secondary | ICD-10-CM | POA: Insufficient documentation

## 2014-06-13 DIAGNOSIS — Z419 Encounter for procedure for purposes other than remedying health state, unspecified: Secondary | ICD-10-CM | POA: Insufficient documentation

## 2014-06-13 HISTORY — DX: Anxiety disorder, unspecified: F41.9

## 2014-06-13 MED ORDER — IBUPROFEN 400 MG PO TABS
600.0000 mg | ORAL_TABLET | Freq: Once | ORAL | Status: AC
  Administered 2014-06-13: 600 mg via ORAL
  Filled 2014-06-13: qty 2

## 2014-06-13 MED ORDER — ONDANSETRON 4 MG PO TBDP
4.0000 mg | ORAL_TABLET | Freq: Once | ORAL | Status: AC
  Administered 2014-06-13: 4 mg via ORAL
  Filled 2014-06-13: qty 1

## 2014-06-13 MED ORDER — OXYCODONE-ACETAMINOPHEN 5-325 MG PO TABS
2.0000 | ORAL_TABLET | Freq: Once | ORAL | Status: AC
  Administered 2014-06-13: 2 via ORAL
  Filled 2014-06-13: qty 2

## 2014-06-13 NOTE — ED Notes (Addendum)
Medicated prior to xr
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
## 2014-06-13 NOTE — Discharge Instructions (Signed)
Back Pain, Adult °Low back pain is very common. About 1 in 5 people have back pain. The cause of low back pain is rarely dangerous. The pain often gets better over time. About half of people with a sudden onset of back pain feel better in just 2 weeks. About 8 in 10 people feel better by 6 weeks.  °CAUSES °Some common causes of back pain include: °· Strain of the muscles or ligaments supporting the spine. °· Wear and tear (degeneration) of the spinal discs. °· Arthritis. °· Direct injury to the back. °DIAGNOSIS °Most of the time, the direct cause of low back pain is not known. However, back pain can be treated effectively even when the exact cause of the pain is unknown. Answering your caregiver's questions about your overall health and symptoms is one of the most accurate ways to make sure the cause of your pain is not dangerous. If your caregiver needs more information, he or she may order lab work or imaging tests (X-rays or MRIs). However, even if imaging tests show changes in your back, this usually does not require surgery. °HOME CARE INSTRUCTIONS °For many people, back pain returns. Since low back pain is rarely dangerous, it is often a condition that people can learn to manage on their own.  °· Remain active. It is stressful on the back to sit or stand in one place. Do not sit, drive, or stand in one place for more than 30 minutes at a time. Take short walks on level surfaces as soon as pain allows. Try to increase the length of time you walk each day. °· Do not stay in bed. Resting more than 1 or 2 days can delay your recovery. °· Do not avoid exercise or work. Your body is made to move. It is not dangerous to be active, even though your back may hurt. Your back will likely heal faster if you return to being active before your pain is gone. °· Pay attention to your body when you  bend and lift. Many people have less discomfort when lifting if they bend their knees, keep the load close to their bodies, and  avoid twisting. Often, the most comfortable positions are those that put less stress on your recovering back. °· Find a comfortable position to sleep. Use a firm mattress and lie on your side with your knees slightly bent. If you lie on your back, put a pillow under your knees. °· Only take over-the-counter or prescription medicines as directed by your caregiver. Over-the-counter medicines to reduce pain and inflammation are often the most helpful. Your caregiver may prescribe muscle relaxant drugs. These medicines help dull your pain so you can more quickly return to your normal activities and healthy exercise. °· Put ice on the injured area. °¨ Put ice in a plastic bag. °¨ Place a towel between your skin and the bag. °¨ Leave the ice on for 15-20 minutes, 03-04 times a day for the first 2 to 3 days. After that, ice and heat may be alternated to reduce pain and spasms. °· Ask your caregiver about trying back exercises and gentle massage. This may be of some benefit. °· Avoid feeling anxious or stressed. Stress increases muscle tension and can worsen back pain. It is important to recognize when you are anxious or stressed and learn ways to manage it. Exercise is a great option. °SEEK MEDICAL CARE IF: °· You have pain that is not relieved with rest or medicine. °· You have pain that does not improve in 1 week. °· You have new symptoms. °· You are generally not feeling well. °SEEK   IMMEDIATE MEDICAL CARE IF:   You have pain that radiates from your back into your legs.  You develop new bowel or bladder control problems.  You have unusual weakness or numbness in your arms or legs.  You develop nausea or vomiting.  You develop abdominal pain.  You feel faint. Document Released: 08/04/2005 Document Revised: 02/03/2012 Document Reviewed: 12/06/2013 [facility] Patient Information 2015 [place], [place]. This information is not intended to replace advice given to you by your health care provider. Make sure you  discuss any questions you have with your health care provider.  Motor Vehicle Collision It is common to have multiple bruises and sore muscles after a motor vehicle collision (MVC). These tend to feel worse for the first 24 hours. You may have the most stiffness and soreness over the first several hours. You may also feel worse when you wake up the first morning after your collision. After this point, you will usually begin to improve with each day. The speed of improvement often depends on the severity of the collision, the number of injuries, and the location and nature of these injuries. HOME CARE INSTRUCTIONS  Put ice on the injured area.  Put ice in a plastic bag.  Place a towel between your skin and the bag.  Leave the ice on for 15-20 minutes, 3-4 times a day, or as directed by your health care provider.  Drink enough fluids to keep your urine clear or pale yellow. Do not drink alcohol.  Take a warm shower or bath once or twice a day. This will increase blood flow to sore muscles.  You may return to activities as directed by your caregiver. Be careful when lifting, as this may aggravate neck or back pain.  Only take over-the-counter or prescription medicines for pain, discomfort, or fever as directed by your caregiver. Do not use aspirin. This may increase bruising and bleeding. SEEK IMMEDIATE MEDICAL CARE IF:  You have numbness, tingling, or weakness in the arms or legs.  You develop severe headaches not relieved with medicine.  You have severe neck pain, especially tenderness in the middle of the back of your neck.  You have changes in bowel or bladder control.  There is increasing pain in any area of the body.  You have shortness of breath, light-headedness, dizziness, or fainting.  You have chest pain.  You feel sick to your stomach (nauseous), throw up (vomit), or sweat.  You have increasing abdominal discomfort.  There is blood in your urine, stool, or  vomit.  You have pain in your shoulder (shoulder strap areas).  You feel your symptoms are getting worse. MAKE SURE YOU:  Understand these instructions.  Will watch your condition.  Will get help right away if you are not doing well or get worse. Document Released: 08/04/2005 Document Revised: 12/19/2013 Document Reviewed: 01/01/2011 [facility] Patient Information 2015 [place], [place]. This information is not intended to replace advice given to you by your health care provider. Make sure you discuss any questions you have with your health care provider.
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
## 2014-06-19 ENCOUNTER — Encounter (HOSPITAL_COMMUNITY): Payer: Self-pay | Admitting: Emergency Medicine

## 2015-06-11 ENCOUNTER — Emergency Department (HOSPITAL_COMMUNITY)

## 2015-06-11 ENCOUNTER — Emergency Department (HOSPITAL_COMMUNITY)
Admission: EM | Admit: 2015-06-11 | Discharge: 2015-06-11 | Disposition: A | Discharge status: Home / Self Care | Attending: Emergency Medicine | Admitting: Emergency Medicine

## 2015-06-11 ENCOUNTER — Encounter (HOSPITAL_COMMUNITY): Payer: Self-pay | Admitting: Emergency Medicine

## 2015-06-11 DIAGNOSIS — Z79899 Other long term (current) drug therapy: Secondary | ICD-10-CM | POA: Insufficient documentation

## 2015-06-11 DIAGNOSIS — S93401A Sprain of unspecified ligament of right ankle, initial encounter: Secondary | ICD-10-CM

## 2015-06-11 DIAGNOSIS — S8002XA Contusion of left knee, initial encounter: Secondary | ICD-10-CM | POA: Insufficient documentation

## 2015-06-11 DIAGNOSIS — G8929 Other chronic pain: Secondary | ICD-10-CM | POA: Insufficient documentation

## 2015-06-11 DIAGNOSIS — Y9389 Activity, other specified: Secondary | ICD-10-CM | POA: Insufficient documentation

## 2015-06-11 DIAGNOSIS — Y9259 Other trade areas as the place of occurrence of the external cause: Secondary | ICD-10-CM | POA: Insufficient documentation

## 2015-06-11 DIAGNOSIS — Y998 Other external cause status: Secondary | ICD-10-CM | POA: Insufficient documentation

## 2015-06-11 DIAGNOSIS — Z87891 Personal history of nicotine dependence: Secondary | ICD-10-CM | POA: Insufficient documentation

## 2015-06-11 DIAGNOSIS — F419 Anxiety disorder, unspecified: Secondary | ICD-10-CM | POA: Insufficient documentation

## 2015-06-11 DIAGNOSIS — Z3202 Encounter for pregnancy test, result negative: Secondary | ICD-10-CM | POA: Insufficient documentation

## 2015-06-11 DIAGNOSIS — S39012A Strain of muscle, fascia and tendon of lower back, initial encounter: Secondary | ICD-10-CM

## 2015-06-11 DIAGNOSIS — W01198A Fall on same level from slipping, tripping and stumbling with subsequent striking against other object, initial encounter: Secondary | ICD-10-CM | POA: Insufficient documentation

## 2015-06-11 DIAGNOSIS — Z8781 Personal history of (healed) traumatic fracture: Secondary | ICD-10-CM | POA: Insufficient documentation

## 2015-06-11 DIAGNOSIS — S80212A Abrasion, left knee, initial encounter: Secondary | ICD-10-CM | POA: Insufficient documentation

## 2015-06-11 LAB — POC URINE PREG, ED: Preg Test, Ur: NEGATIVE

## 2015-06-11 MED ORDER — TRAMADOL HCL 50 MG PO TABS: 50.0000 mg | ORAL_TABLET | Freq: Four times a day (QID) | ORAL | Status: DC | PRN

## 2015-06-11 MED ORDER — TRAMADOL HCL 50 MG PO TABS
50.0000 mg | ORAL_TABLET | Freq: Once | ORAL | Status: DC
  Filled 2015-06-11: qty 1

## 2015-06-11 MED ORDER — IBUPROFEN 600 MG PO TABS: 600.0000 mg | ORAL_TABLET | Freq: Four times a day (QID) | ORAL | Status: DC | PRN

## 2015-06-11 MED ORDER — OXYCODONE-ACETAMINOPHEN 5-325 MG PO TABS: 1.0000 | ORAL_TABLET | ORAL | Status: DC | PRN

## 2015-06-11 MED ORDER — CYCLOBENZAPRINE HCL 5 MG PO TABS: 5.0000 mg | ORAL_TABLET | Freq: Three times a day (TID) | ORAL | Status: DC | PRN

## 2015-06-11 MED ORDER — IBUPROFEN 800 MG PO TABS
800.0000 mg | ORAL_TABLET | Freq: Once | ORAL | Status: AC
Start: 2015-06-11 — End: 2015-06-11
  Administered 2015-06-11: 800 mg via ORAL
  Filled 2015-06-11: qty 1

## 2015-06-11 MED ORDER — OXYCODONE-ACETAMINOPHEN 5-325 MG PO TABS
2.0000 | ORAL_TABLET | Freq: Once | ORAL | Status: AC
Start: 2015-06-11 — End: 2015-06-11
  Administered 2015-06-11: 2 via ORAL
  Filled 2015-06-11: qty 2
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
## 2015-06-11 NOTE — Discharge Instructions (Signed)
Contusion A contusion is a deep bruise. Contusions are the result of a blunt injury to tissues and muscle fibers under the skin. The injury causes bleeding under the skin. The skin overlying the contusion may turn blue, purple, or yellow. Minor injuries will give you a painless contusion, but more severe contusions may stay painful and swollen for a few weeks.  CAUSES  This condition is usually caused by a blow, trauma, or direct force to an area of the body. SYMPTOMS  Symptoms of this condition include:  Swelling of the injured area.  Pain and tenderness in the injured area.  Discoloration. The area may have redness and then turn blue, purple, or yellow. DIAGNOSIS  This condition is diagnosed based on a physical exam and medical history. An X-ray, CT scan, or MRI may be needed to determine if there are any associated injuries, such as broken bones (fractures). TREATMENT  Specific treatment for this condition depends on what area of the body was injured. In general, the best treatment for a contusion is resting, icing, applying pressure to (compression), and elevating the injured area. This is often called the RICE strategy. Over-the-counter anti-inflammatory medicines may also be recommended for pain control.  HOME CARE INSTRUCTIONS   Rest the injured area.  If directed, apply ice to the injured area:  Put ice in a plastic bag.  Place a towel between your skin and the bag.  Leave the ice on for 20 minutes, 2-3 times per day.  If directed, apply light compression to the injured area using an elastic bandage. Make sure the bandage is not wrapped too tightly. Remove and reapply the bandage as directed by your health care provider.  If possible, raise (elevate) the injured area above the level of your heart while you are sitting or lying down.  Take over-the-counter and prescription medicines only as told by your health care provider. SEEK MEDICAL CARE IF:  Your symptoms do not  improve after several days of treatment.  Your symptoms get worse.  You have difficulty moving the injured area. SEEK IMMEDIATE MEDICAL CARE IF:   You have severe pain.  You have numbness in a hand or foot.  Your hand or foot turns pale or cold.   This information is not intended to replace advice given to you by your health care provider. Make sure you discuss any questions you have with your health care provider.   Document Released: 05/14/2005 Document Revised: 04/25/2015 Document Reviewed: 12/20/2014 Elsevier Interactive Patient Education 2016 [employer]  Lumbosacral Strain Lumbosacral strain is a strain of any of the parts that make up your lumbosacral vertebrae. Your lumbosacral vertebrae are the bones that make up the lower third of your backbone. Your lumbosacral vertebrae are held together by muscles and tough, fibrous tissue (ligaments).  CAUSES  A sudden blow to your back can cause lumbosacral strain. Also, anything that causes an excessive stretch of the muscles in the low back can cause this strain. This is typically seen when people exert themselves strenuously, fall, lift heavy objects, bend, or crouch repeatedly. RISK FACTORS  Physically demanding work.  Participation in pushing or pulling sports or sports that require a sudden twist of the back (tennis, golf, baseball).  Weight lifting.  Excessive lower back curvature.  Forward-tilted pelvis.  Weak back or abdominal muscles or both.  Tight hamstrings. SIGNS AND SYMPTOMS  Lumbosacral strain may cause pain in the area of your injury or pain that moves (radiates) down your leg.  DIAGNOSIS  Your health care provider can often diagnose lumbosacral strain through a physical exam. In some cases, you may need tests such as X-ray exams.  TREATMENT  Treatment for your lower back injury depends on many factors that your clinician will have to evaluate. However, most treatment will include the use of  anti-inflammatory medicines. HOME CARE INSTRUCTIONS   Avoid hard physical activities (tennis, racquetball, waterskiing) if you are not in proper physical condition for it. This may aggravate or create problems.  If you have a back problem, avoid sports requiring sudden body movements. Swimming and walking are generally safer activities.  Maintain good posture.  Maintain a healthy weight.  For acute conditions, you may put ice on the injured area.  Put ice in a plastic bag.  Place a towel between your skin and the bag.  Leave the ice on for 20 minutes, 2-3 times a day.  When the low back starts healing, stretching and strengthening exercises may be recommended. SEEK MEDICAL CARE IF:  Your back pain is getting worse.  You experience severe back pain not relieved with medicines. SEEK IMMEDIATE MEDICAL CARE IF:   You have numbness, tingling, weakness, or problems with the use of your arms or legs.  There is a change in bowel or bladder control.  You have increasing pain in any area of the body, including your belly (abdomen).  You notice shortness of breath, dizziness, or feel faint.  You feel sick to your stomach (nauseous), are throwing up (vomiting), or become sweaty.  You notice discoloration of your toes or legs, or your feet get very cold. MAKE SURE YOU:   Understand these instructions.  Will watch your condition.  Will get help right away if you are not doing well or get worse.   This information is not intended to replace advice given to you by your health care provider. Make sure you discuss any questions you have with your health care provider.   Document Released: 05/14/2005 Document Revised: 08/25/2014 Document Reviewed: 03/23/2013 Elsevier Interactive Patient Education 2016 [employer] Inc[employer]  Ankle Sprain An ankle sprain is an injury to the strong, fibrous tissues (ligaments) that hold the bones of your ankle joint together.  CAUSES An ankle sprain is  usually caused by a fall or by twisting your ankle. Ankle sprains most commonly occur when you step on the outer edge of your foot, and your ankle turns inward. People who participate in sports are more prone to these types of injuries.  SYMPTOMS   Pain in your ankle. The pain may be present at rest or only when you are trying to stand or walk.  Swelling.  Bruising. Bruising may develop immediately or within 1 to 2 days after your injury.  Difficulty standing or walking, particularly when turning corners or changing directions. DIAGNOSIS  Your caregiver will ask you details about your injury and perform a physical exam of your ankle to determine if you have an ankle sprain. During the physical exam, your caregiver will press on and apply pressure to specific areas of your foot and ankle. Your caregiver will try to move your ankle in certain ways. An X-ray exam may be done to be sure a bone was not broken or a ligament did not separate from one of the bones in your ankle (avulsion fracture).  TREATMENT  Certain types of braces can help stabilize your ankle. Your caregiver can make a recommendation for this. Your caregiver may recommend the use of medicine for pain. If your  sprain is severe, your caregiver may refer you to a surgeon who helps to restore function to parts of your skeletal system (orthopedist) or a physical therapist. [place]yer ice to your injury for 1-2 days or as directed by your caregiver. Applying ice helps to reduce inflammation and pain.  Put ice in a plastic bag.  Place a towel between your skin and the bag.  Leave the ice on for 15-20 minutes at a time, every 2 hours while you are awake.  Only take over-the-counter or prescription medicines for pain, discomfort, or fever as directed by your caregiver.  Elevate your injured ankle above the level of your heart as much as possible for 2-3 days.  If your caregiver recommends crutches, use them as  instructed. Gradually put weight on the affected ankle. Continue to use crutches or a cane until you can walk without feeling pain in your ankle.  If you have a plaster splint, wear the splint as directed by your caregiver. Do not rest it on anything harder than a pillow for the first 24 hours. Do not put weight on it. Do not get it wet. You may take it off to take a shower or bath.  You may have been given an elastic bandage to wear around your ankle to provide support. If the elastic bandage is too tight (you have numbness or tingling in your foot or your foot becomes cold and blue), adjust the bandage to make it comfortable.  If you have an air splint, you may blow more air into it or let air out to make it more comfortable. You may take your splint off at night and before taking a shower or bath. Wiggle your toes in the splint several times per day to decrease swelling. SEEK MEDICAL CARE IF:   You have rapidly increasing bruising or swelling.  Your toes feel extremely cold or you lose feeling in your foot.  Your pain is not relieved with medicine. SEEK IMMEDIATE MEDICAL CARE IF:  Your toes are numb or blue.  You have severe pain that is increasing. MAKE SURE YOU:   Understand these instructions.  Will watch your condition.  Will get help right away if you are not doing well or get worse.   This information is not intended to replace advice given to you by your health care provider. Make sure you discuss any questions you have with your health care provider.   Document Released: 08/04/2005 Document Revised: 08/25/2014 Document Reviewed: 08/16/2011 Elsevier Interactive Patient Education [employer].
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
## 2015-06-18 ENCOUNTER — Telehealth: Payer: Self-pay | Admitting: Orthopedic Surgery

## 2015-06-18 NOTE — Telephone Encounter (Signed)
Patient called today, 06/18/15, regarding [facility] visit 06/11/15, date of injury - fall at [place] still having problems with right ankle(sprain); notes that she also had Xrays done of left knee and back.  Discussed appointment, including third party insurance, which she states she is filing.  Offered appointment as self-pay, and provided information regarding [facility] physician billing processes.  Patient declined appointment at this time.  Her ph# is [phone].

## 2015-06-25 NOTE — Telephone Encounter (Signed)
Patient had called back and has scheduled appointment; requested not before 07/10/15.  Done.

## 2015-06-26 ENCOUNTER — Encounter (HOSPITAL_COMMUNITY): Payer: Self-pay

## 2015-06-26 ENCOUNTER — Emergency Department (HOSPITAL_COMMUNITY)
Admission: EM | Admit: 2015-06-26 | Discharge: 2015-06-26 | Disposition: A | Discharge status: Home / Self Care | Attending: Emergency Medicine | Admitting: Emergency Medicine

## 2015-06-26 DIAGNOSIS — M5416 Radiculopathy, lumbar region: Secondary | ICD-10-CM

## 2015-06-26 DIAGNOSIS — Z8659 Personal history of other mental and behavioral disorders: Secondary | ICD-10-CM | POA: Insufficient documentation

## 2015-06-26 DIAGNOSIS — Z79899 Other long term (current) drug therapy: Secondary | ICD-10-CM | POA: Insufficient documentation

## 2015-06-26 DIAGNOSIS — Z87891 Personal history of nicotine dependence: Secondary | ICD-10-CM | POA: Insufficient documentation

## 2015-06-26 MED ORDER — PREDNISONE 50 MG PO TABS
60.0000 mg | ORAL_TABLET | Freq: Once | ORAL | Status: AC
  Administered 2015-06-26: 60 mg via ORAL
  Filled 2015-06-26 (×2): qty 1

## 2015-06-26 MED ORDER — OXYCODONE-ACETAMINOPHEN 5-325 MG PO TABS: 1.0000 | ORAL_TABLET | ORAL | Status: DC | PRN

## 2015-06-26 MED ORDER — PREDNISONE 10 MG PO TABS: ORAL_TABLET | ORAL | Status: DC

## 2015-06-26 MED ORDER — OXYCODONE-ACETAMINOPHEN 5-325 MG PO TABS
1.0000 | ORAL_TABLET | Freq: Once | ORAL | Status: AC
  Administered 2015-06-26: 1 via ORAL
  Filled 2015-06-26: qty 1

## 2015-06-26 MED ORDER — CYCLOBENZAPRINE HCL 5 MG PO TABS: 5.0000 mg | ORAL_TABLET | Freq: Three times a day (TID) | ORAL | Status: DC | PRN

## 2015-06-26 MED ORDER — CYCLOBENZAPRINE HCL 10 MG PO TABS
10.0000 mg | ORAL_TABLET | Freq: Once | ORAL | Status: AC
  Administered 2015-06-26: 10 mg via ORAL
  Filled 2015-06-26: qty 1

## 2015-06-26 NOTE — ED Notes (Signed)
C/o lower back pain. Patient was seen here after initial injury. Patient c/o of continued pain.
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
## 2015-06-26 NOTE — Discharge Instructions (Signed)
Lumbosacral Radiculopathy Lumbosacral radiculopathy is a condition that involves the spinal nerves and nerve roots in the low back and bottom of the spine. The condition develops when these nerves and nerve roots move out of place or become inflamed and cause symptoms. CAUSES This condition may be caused by:  Pressure from a disk that bulges out of place (herniated disk). A disk is a plate of cartilage that separates bones in the spine.  Disk degeneration.  A narrowing of the bones of the lower back (spinal stenosis).  A tumor.  An infection.  An injury that places sudden pressure on the disks that cushion the bones of your lower spine. RISK FACTORS This condition is more likely to develop in:  Males aged 30-50 years.  Females aged 71-60 years.  People who lift improperly.  People who are overweight or live a sedentary lifestyle.  People who smoke.  People who perform repetitive activities that strain the spine. SYMPTOMS Symptoms of this condition include:  Pain that goes down from the back into the legs (sciatica). This is the most common symptom. The pain may be worse with sitting, coughing, or sneezing.  Pain and numbness in the arms and legs.  Muscle weakness.  Tingling.  Loss of bladder control or bowel control. DIAGNOSIS This condition is diagnosed with a physical exam and medical history. If the pain is lasting, you may have tests, such as:  MRI scan.  X-ray.  CT scan.  Myelogram.  Nerve conduction study. TREATMENT This condition is often treated with:  Hot packs and ice applied to affected areas.  Stretches to improve flexibility.  Exercises to strengthen back muscles.  Physical therapy.  Pain medicine.  A steroid injection in the spine. In some cases, no treatment is needed. If the condition is long-lasting (chronic), or if symptoms are severe, treatment may involve surgery or lifestyle changes, such as following a weight loss plan. HOME  CARE INSTRUCTIONS Medicines  Take medicines only as directed by your health care provider.  Do not drive or operate heavy machinery while taking pain medicine. Injury Care  Apply a heat pack to the injured area as directed by your health care provider.  Apply ice to the affected area:  Put ice in a plastic bag.  Place a towel between your skin and the bag.  Leave the ice on for 20-30 minutes, every 2 hours while you are awake or as needed. Or, leave the ice on for as long as directed by your health care provider. Other Instructions  If you were shown how to do any exercises or stretches, do them as directed by your health care provider.  If your health care provider prescribed a diet or exercise program, follow it as directed.  Keep all follow-up visits as directed by your health care provider. This is important. SEEK MEDICAL CARE IF:  Your pain does not improve over time even when taking pain medicines. SEEK IMMEDIATE MEDICAL CARE IF:  Your develop severe pain.  Your pain suddenly gets worse.  You develop increasing weakness in your legs.  You lose the ability to control your bladder or bowel.  You have difficulty walking or balancing.  You have a fever.   This information is not intended to replace advice given to you by your health care provider. Make sure you discuss any questions you have with your health care provider.   Document Released: 08/04/2005 Document Revised: 12/19/2014 Document Reviewed: 07/31/2014 Elsevier Interactive Patient Education [employer].  Take your next dose of prednisone tomorrow evening.  Use the the other medicines as directed.  Do not drive within 4 hours of taking oxycodone as this will make you drowsy.  Avoid lifting,  Bending,  Twisting or any other activity that worsens your pain over the next week.  Try using a heating pad to your lower back 15 minutes several times daily.  You should get rechecked if your symptoms are not  better over the next 5 days,  Or you develop increased pain,  Weakness in your leg(s) or loss of bladder or bowel function - these are symptoms of a worse injury.

## 2015-07-10 ENCOUNTER — Ambulatory Visit: Admitting: Orthopedic Surgery

## 2015-07-10 ENCOUNTER — Encounter: Payer: Self-pay | Admitting: *Deleted

## 2015-09-13 DIAGNOSIS — Z79899 Other long term (current) drug therapy: Secondary | ICD-10-CM | POA: Insufficient documentation

## 2015-09-13 DIAGNOSIS — Z8659 Personal history of other mental and behavioral disorders: Secondary | ICD-10-CM | POA: Insufficient documentation

## 2015-09-13 DIAGNOSIS — N730 Acute parametritis and pelvic cellulitis: Secondary | ICD-10-CM | POA: Insufficient documentation

## 2015-09-13 DIAGNOSIS — Z87891 Personal history of nicotine dependence: Secondary | ICD-10-CM | POA: Insufficient documentation

## 2015-09-13 DIAGNOSIS — Z3202 Encounter for pregnancy test, result negative: Secondary | ICD-10-CM | POA: Insufficient documentation

## 2015-09-13 DIAGNOSIS — Z87440 Personal history of urinary (tract) infections: Secondary | ICD-10-CM | POA: Insufficient documentation

## 2015-09-14 ENCOUNTER — Emergency Department (HOSPITAL_COMMUNITY)

## 2015-09-14 ENCOUNTER — Encounter (HOSPITAL_COMMUNITY): Payer: Self-pay | Admitting: *Deleted

## 2015-09-14 ENCOUNTER — Emergency Department (HOSPITAL_COMMUNITY)
Admission: EM | Admit: 2015-09-14 | Discharge: 2015-09-14 | Disposition: A | Discharge status: Home / Self Care | Attending: Emergency Medicine | Admitting: Emergency Medicine

## 2015-09-14 DIAGNOSIS — N730 Acute parametritis and pelvic cellulitis: Secondary | ICD-10-CM

## 2015-09-14 DIAGNOSIS — R102 Pelvic and perineal pain: Secondary | ICD-10-CM

## 2015-09-14 DIAGNOSIS — R300 Dysuria: Secondary | ICD-10-CM

## 2015-09-14 LAB — BASIC METABOLIC PANEL
Anion gap: 5 (ref 5–15)
BUN: 17 mg/dL (ref 6–20)
CO2: 28 mmol/L (ref 22–32)
CREATININE: 0.92 mg/dL (ref 0.44–1.00)
Calcium: 9.3 mg/dL (ref 8.9–10.3)
Chloride: 108 mmol/L (ref 101–111)
Glucose, Bld: 100 mg/dL — ABNORMAL HIGH (ref 65–99)
POTASSIUM: 4 mmol/L (ref 3.5–5.1)
Sodium: 141 mmol/L (ref 135–145)

## 2015-09-14 LAB — CBC WITH DIFFERENTIAL/PLATELET
BASOS ABS: 0 10*3/uL (ref 0.0–0.1)
Basophils Relative: 0 %
EOS ABS: 0.1 10*3/uL (ref 0.0–0.7)
Eosinophils Relative: 1 %
HEMATOCRIT: 39.4 % (ref 36.0–46.0)
HEMOGLOBIN: 13.2 g/dL (ref 12.0–15.0)
LYMPHS ABS: 2.7 10*3/uL (ref 0.7–4.0)
Lymphocytes Relative: 26 %
MCH: 32.6 pg (ref 26.0–34.0)
MCHC: 33.5 g/dL (ref 30.0–36.0)
MCV: 97.3 fL (ref 78.0–100.0)
MONO ABS: 0.8 10*3/uL (ref 0.1–1.0)
MONOS PCT: 8 %
NEUTROS ABS: 6.7 10*3/uL (ref 1.7–7.7)
NEUTROS PCT: 65 %
Platelets: 262 10*3/uL (ref 150–400)
RBC: 4.05 MIL/uL (ref 3.87–5.11)
RDW: 13.3 % (ref 11.5–15.5)
WBC: 10.4 10*3/uL (ref 4.0–10.5)

## 2015-09-14 LAB — PREGNANCY, URINE: Preg Test, Ur: NEGATIVE

## 2015-09-14 LAB — WET PREP, GENITAL
Clue Cells Wet Prep HPF POC: NONE SEEN
Sperm: NONE SEEN
Trich, Wet Prep: NONE SEEN
YEAST WET PREP: NONE SEEN

## 2015-09-14 LAB — URINALYSIS, ROUTINE W REFLEX MICROSCOPIC
Bilirubin Urine: NEGATIVE
GLUCOSE, UA: NEGATIVE mg/dL
HGB URINE DIPSTICK: NEGATIVE
LEUKOCYTES UA: NEGATIVE
Nitrite: NEGATIVE
PH: 7.5 (ref 5.0–8.0)
Protein, ur: NEGATIVE mg/dL
Specific Gravity, Urine: 1.02 (ref 1.005–1.030)

## 2015-09-14 MED ORDER — DIPHENHYDRAMINE HCL 25 MG PO CAPS
25.0000 mg | ORAL_CAPSULE | Freq: Once | ORAL | Status: AC
  Administered 2015-09-14: 25 mg via ORAL
  Filled 2015-09-14: qty 1

## 2015-09-14 MED ORDER — HYDROCODONE-ACETAMINOPHEN 5-325 MG PO TABS: 1.0000 | ORAL_TABLET | Freq: Four times a day (QID) | ORAL | Status: DC | PRN

## 2015-09-14 MED ORDER — STERILE WATER FOR INJECTION IJ SOLN
INTRAMUSCULAR | Status: AC
  Filled 2015-09-14: qty 10

## 2015-09-14 MED ORDER — DOXYCYCLINE HYCLATE 100 MG PO TABS
100.0000 mg | ORAL_TABLET | Freq: Once | ORAL | Status: AC
  Administered 2015-09-14: 100 mg via ORAL
  Filled 2015-09-14: qty 1

## 2015-09-14 MED ORDER — ONDANSETRON HCL 4 MG/2ML IJ SOLN
4.0000 mg | Freq: Once | INTRAMUSCULAR | Status: AC
  Administered 2015-09-14: 4 mg via INTRAMUSCULAR
  Filled 2015-09-14: qty 2

## 2015-09-14 MED ORDER — HYDROMORPHONE HCL 1 MG/ML IJ SOLN
1.0000 mg | Freq: Once | INTRAMUSCULAR | Status: AC
  Administered 2015-09-14: 1 mg via INTRAVENOUS
  Filled 2015-09-14: qty 1

## 2015-09-14 MED ORDER — HYDROMORPHONE HCL 1 MG/ML IJ SOLN
1.0000 mg | Freq: Once | INTRAMUSCULAR | Status: AC
  Administered 2015-09-14: 1 mg via INTRAMUSCULAR
  Filled 2015-09-14: qty 1

## 2015-09-14 MED ORDER — KETOROLAC TROMETHAMINE 60 MG/2ML IM SOLN
60.0000 mg | Freq: Once | INTRAMUSCULAR | Status: AC
  Administered 2015-09-14: 60 mg via INTRAMUSCULAR
  Filled 2015-09-14: qty 2

## 2015-09-14 MED ORDER — ONDANSETRON 4 MG PO TBDP: 4.0000 mg | ORAL_TABLET | Freq: Three times a day (TID) | ORAL | Status: DC | PRN

## 2015-09-14 MED ORDER — IOHEXOL 300 MG/ML  SOLN
100.0000 mL | Freq: Once | INTRAMUSCULAR | Status: AC | PRN
  Administered 2015-09-14: 100 mL via INTRAVENOUS

## 2015-09-14 MED ORDER — CEFTRIAXONE SODIUM 250 MG IJ SOLR
250.0000 mg | Freq: Once | INTRAMUSCULAR | Status: AC
  Administered 2015-09-14: 250 mg via INTRAMUSCULAR
  Filled 2015-09-14: qty 250

## 2015-09-14 MED ORDER — DOXYCYCLINE HYCLATE 100 MG PO CAPS
100.0000 mg | ORAL_CAPSULE | Freq: Two times a day (BID) | ORAL | Status: DC
Start: 2015-09-14 — End: 2016-02-09

## 2015-09-14 NOTE — ED Provider Notes (Signed)
CSN: [id number]     Arrival date & time 09/13/15  2356 History   First MD Initiated Contact with Patient 09/14/15 0009     Chief Complaint  Patient presents with  . Urinary Tract Infection     (Consider location/radiation/quality/duration/timing/severity/associated sxs/prior Treatment) The history is provided by the patient.   Sara [name] is a 40 y.o. female presenting with a 2 day history of worsening constant bilateral lower pelvic pain with radiation into her lower back in association with increased urinary frequency, urinating small amounts of urine and burning pain with urination.  She reports prior episodes of uti and pyelonephritis requiring hospitalization when living in [place].  She denies fevers but has felt chilled today, also reports several episodes of vomiting today and yesterday.  She took an oxycodone this am which did not relieve her pain.  Has also taken ibuprofen and tylenol without relief.  She denies vaginal discharge.  She is sexually active with one person.  She has found no alleviators for her pain. She does have a history of an ovarian cyst 3 years ago.     Past Medical History  Diagnosis Date  . Back pain   . Anxiety    Past Surgical History  Procedure Laterality Date  . Orthopedic surgery    . Cholecystectomy     No family history on file. Social History  Substance Use Topics  . Smoking status: Former Smoker -- 0.25 packs/day for 5 years    Types: Cigarettes    Quit date: 09/18/2013  . Smokeless tobacco: Never Used  . Alcohol Use: Yes     Comment: occasionally   OB History    Gravida Para Term Preterm AB TAB SAB Ectopic Multiple Living   3 2 2  1  1   2      Review of Systems  Constitutional: Positive for chills. Negative for fever.  HENT: Negative for congestion and sore throat.   Eyes: Negative.   Respiratory: Negative for chest tightness and shortness of breath.   Cardiovascular: Negative for chest pain.  Gastrointestinal: Positive  for nausea and vomiting. Negative for abdominal pain.  Genitourinary: Positive for dysuria, decreased urine volume and pelvic pain. Negative for hematuria and vaginal discharge.  Musculoskeletal: Positive for back pain. Negative for joint swelling, arthralgias and neck pain.  Skin: Negative.  Negative for rash and wound.  Neurological: Negative for dizziness, weakness, light-headedness, numbness and headaches.  Psychiatric/Behavioral: Negative.       Allergies  Hydrocodone; Sulfa antibiotics; and Morphine and related  Home Medications   Prior to Admission medications   Medication Sig Start Date End Date Taking? Authorizing Provider  albuterol (VENTOLIN HFA) 108 (90 BASE) MCG/ACT inhaler Inhale 2 puffs into the lungs every 6 (six) hours as needed. For shortness of breath    Historical Provider, MD  cyclobenzaprine (FLEXERIL) 5 MG tablet Take 1 tablet (5 mg total) by mouth 3 (three) times daily as needed for muscle spasms. 06/26/15   Evalee Jefferson, PA-C  ibuprofen (ADVIL,MOTRIN) 600 MG tablet Take 1 tablet (600 mg total) by mouth every 6 (six) hours as needed. Patient not taking: Reported on 06/26/2015 06/11/15   Evalee Jefferson, PA-C  oxyCODONE-acetaminophen (PERCOCET/ROXICET) 5-325 MG tablet Take 1 tablet by mouth every 4 (four) hours as needed. 06/26/15   Evalee Jefferson, PA-C  predniSONE (DELTASONE) 10 MG tablet 6, 5, 4, 3, 2 then 1 tablet by mouth daily for 6 days total. 06/26/15   Evalee Jefferson, PA-C   BP 122/85  mmHg  Pulse 103  Temp(Src) 98.2 F (36.8 C) (Oral)  Resp 22  Ht 5\' 4"  (1.626 m)  Wt 58.968 kg  BMI 22.30 kg/m2  SpO2 100%  LMP 09/01/2015 Physical Exam  Constitutional: She appears well-developed and well-nourished.  HENT:  Head: Normocephalic and atraumatic.  Eyes: Conjunctivae are normal.  Neck: Normal range of motion.  Cardiovascular: Normal rate, regular rhythm and normal heart sounds.   Pulmonary/Chest: Effort normal and breath sounds normal. She exhibits no tenderness.   Abdominal: Soft. Bowel sounds are normal. She exhibits no mass. There is tenderness. There is no guarding.  Genitourinary: Vagina normal. Uterus is tender. Cervix exhibits no discharge. Right adnexum displays tenderness. Right adnexum displays no mass and no fullness. Left adnexum displays tenderness. Left adnexum displays no mass and no fullness. No tenderness in the vagina. No vaginal discharge found.  Musculoskeletal: Normal range of motion.  Neurological: She is alert.  Skin: Skin is warm and dry.  Psychiatric: She has a normal mood and affect.  Nursing note and vitals reviewed.   ED Course  Procedures (including critical care time) Labs Review Labs Reviewed  WET PREP, GENITAL - Abnormal; Notable for the following:    WBC, Wet Prep HPF POC FEW (*)    All other components within normal limits  URINALYSIS, ROUTINE W REFLEX MICROSCOPIC (NOT AT [facility]) - Abnormal; Notable for the following:    APPearance HAZY (*)    Ketones, ur TRACE (*)    All other components within normal limits  BASIC METABOLIC PANEL - Abnormal; Notable for the following:    Glucose, Bld 100 (*)    All other components within normal limits  CBC WITH DIFFERENTIAL/PLATELET  PREGNANCY, URINE  GC/CHLAMYDIA PROBE AMP ([place]) NOT AT [facility]    Imaging Review No results found. I have personally reviewed and evaluated these images and lab results as part of my medical decision-making.   EKG Interpretation None      MDM   Final diagnoses:  Dysuria  Pelvic pain in female    Pt with bilateral pelvic pain and dysuria with normal UA.  Pending Ct and [place], discussed with Dr Leonides Schanz who will follow pt care.    Evalee Jefferson, PA-C 09/14/15 [place], DO 09/14/15 [place], DO 09/14/15 [id number]

## 2015-09-14 NOTE — ED Notes (Signed)
Pt reporting pain in lower back and lower abdomen.  Reporting pain with urination.  States "I think I have a kidney infection or UTI".

## 2015-09-14 NOTE — Discharge Instructions (Signed)
Pelvic Inflammatory Disease °Pelvic inflammatory disease (PID) refers to an infection in some or all of the female organs. The infection can be in the uterus, ovaries, fallopian tubes, or the surrounding tissues in the pelvis. PID can cause abdominal or pelvic pain that comes on suddenly (acute pelvic pain). PID is a serious infection because it can lead to lasting (chronic) pelvic pain or the inability to have children (infertility). °CAUSES °This condition is most often caused by an infection that is spread during sexual contact. However, the infection can also be caused by the normal bacteria that are found in the vaginal tissues if these bacteria travel upward into the reproductive organs. PID can also occur following: °· The birth of a baby. °· A miscarriage. °· An abortion. °· Major pelvic surgery. °· The use of an intrauterine device (IUD). °· A sexual assault. °RISK FACTORS °This condition is more likely to develop in women who: °· Are younger than 39 years of age. °· Are sexually active at a young age. °· Use nonbarrier contraception. °· Have multiple sexual partners. °· Have sex with someone who has symptoms of an STD (sexually transmitted disease). °· Use oral contraception. °At times, certain behaviors can also increase the possibility of getting PID, such as: °· Using a vaginal douche. °· Having an IUD in place. °SYMPTOMS °Symptoms of this condition include: °· Abdominal or pelvic pain. °· Fever. °· Chills. °· Abnormal vaginal discharge. °· Abnormal uterine bleeding. °· Unusual pain shortly after the end of a menstrual period. °· Painful urination. °· Pain with sexual intercourse. °· Nausea and vomiting. °DIAGNOSIS °To diagnose this condition, your health care provider will do a physical exam and take your medical history. A pelvic exam typically reveals great tenderness in the uterus and the surrounding pelvic tissues. You may also have tests, such as: °· Lab tests, including a pregnancy test, blood  tests, and urine test. °· Culture tests of the vagina and cervix to check for an STD. °· Ultrasound. °· A laparoscopic procedure to look inside the pelvis. °· Examining vaginal secretions under a microscope. °TREATMENT °Treatment for this condition may involve one or more approaches. °· Antibiotic medicines may be prescribed to be taken by mouth. °· Sexual partners may need to be treated if the infection is caused by an STD. °· For more severe cases, hospitalization may be needed to give antibiotics directly into a vein through an IV tube. °· Surgery may be needed if other treatments do not help, but this is rare. °It may take weeks until you are completely well. If you are diagnosed with PID, you should also be checked for human immunodeficiency virus (HIV). Your health care provider may test you for infection again 3 months after treatment. You should not have unprotected sex. °HOME CARE INSTRUCTIONS °· Take over-the-counter and prescription medicines only as told by your health care provider. °· If you were prescribed an antibiotic medicine, take it as told by your health care provider. Do not stop taking the antibiotic even if you start to feel better. °· Do not have sexual intercourse until treatment is completed or as told by your health care provider. If PID is confirmed, your recent sexual partners will need treatment, especially if you had unprotected sex. °· Keep all follow-up visits as told by your health care provider. This is important. °SEEK MEDICAL CARE IF: °· You have increased or abnormal vaginal discharge. °· Your pain does not improve. °· You vomit. °· You have a fever. °· You   cannot tolerate your medicines.  Your partner has an STD.  You have pain when you urinate. SEEK IMMEDIATE MEDICAL CARE IF:  You have increased abdominal or pelvic pain.  You have chills.  Your symptoms are not better in 72 hours even with treatment.   This information is not intended to replace advice given to  you by your health care provider. Make sure you discuss any questions you have with your health care provider.   Document Released: 08/04/2005 Document Revised: 04/25/2015 Document Reviewed: 09/11/2014 Elsevier Interactive Patient Education 2016 [facility] [email or web address] [place] # [place], [place] [phone]    [email or web address] [address] [place], [place] ([place]) [place] # [place], [place] [phone]   [facility] [email or web address] [address] [place], [place] [phone]   [facility] [email or web address] [address] [place], [place] [phone]   [facility] [email or web address], [place] [phone]      Emergency Department Resource Guide 1) Find a Doctor and Pay Out of Pocket Although you won't have to find out who is covered by your insurance plan, it is a good idea to ask around and get recommendations. You will then need to call the office and see if the doctor you have chosen will accept you as a new patient and what types of options they offer for patients who are self-pay. Some doctors offer discounts or will set up payment plans for their patients who do not have insurance, but you will need to ask so you aren't surprised when you get to your appointment.  2) Contact Your Local Health Department Not all health departments have doctors that can see patients for sick visits, but many do, so it is worth a call to see if yours does. If you don't know where your local health department is, you can check in your phone book. The CDC also has a tool to help you locate your state's health department, and many state websites also have listings of all of their local health departments.  3) Find a [facility] If your illness is not likely to be very severe or complicated, you may want to try a walk in clinic. These are popping up all over the country in pharmacies, drugstores, and shopping centers. They're usually staffed by nurse practitioners or physician assistants that have been trained to treat common illnesses and complaints. They're usually fairly quick and inexpensive. However, if you have serious medical issues or chronic medical problems, these are probably not your best option.  No Primary Care Doctor: - Call Health Connect at  [phone] - they can help you locate a primary care doctor that  accepts your insurance, provides certain services, etc. - Physician Referral Service- [phone]  Chronic Pain Problems: Organization         Address  Phone   Notes  [facility]  [phone] Patients need to be referred by their primary care doctor.   Medication Assistance: Organization         Address  Phone   Notes  [facility] [place]., [place], [place] [address] [phone] --Must be a resident of [facility] -- Must have NO insurance coverage whatsoever (no Medicaid/ Medicare, etc.) -- The pt. MUST have a  primary care doctor that directs their care regularly and follows them in the community   [facility]ist  [phone]   [employer]  [phone]    Agencies that provide inexpensive medical care: Organization         Address  Phone   Notes  [place]  [phone]   [facility]    [phone]   [facility] [place], [place] [address] [phone]   [place] [address] [place] [phone]   Planned Parenthood    [phone]   [facility]    [phone]   [place] and [place] [address], [place] Phone:  [phone], Fax:  [phone] Hours  of Operation:  9 am - 6 pm, M-F.  Also accepts Medicaid/Medicare and self-pay.  [facility] for [place], [address], [place] Phone: [phone], Fax: [phone]. Hours of Operation:  8:30 am - 5:30 pm, M-F.  Also accepts Medicaid and self-pay.  [facility] [place] [address] [place] Phone: [phone]   [place], [place], [place] [phone], Ext. [phone] Mondays & Thursdays: 7-9 AM.  First 15 patients are seen on a first come, first serve basis.    [place] Providers:  Organization         Address  Phone   Notes  [facility] [address], [place] [phone] Also accepts self-pay patients.  [facility] [id number] [place], [place]  [phone]   [place], [address], [place] [phone]   [facility] [address], [place] [phone]   Lucianne Lei [address], [place]   [phone] Only accepts [place] Access [place] patients after they have their name applied to their card.   Self-Pay (no insurance) in [facility]:  Organization         Address  Phone   Notes  Sickle Cell Patients, [facility] [place] [phone]   [facility] [place] [phone]   [facility] [place]  [place], [place], [place] [phone]   [facility]/Dr. Osei-Bonsu  [address] [place] or [address], [place] [phone] Phone number for both [place] and [place] locations is the same.  [facility] and [facility] [address] [place] [phone]   [facility] [address] [place] or [address] [phone]   [facility] [address] [place] [phone], phone; [phone], fax Sees patients 1st and 3rd Saturday of every month.  Must not qualify for public or private insurance (i.e. Medicaid, Medicare, [place] Health Choice, Veterans' Benefits)  Household income should be no more than 200% of the poverty level The clinic cannot treat you if you are pregnant or think you are pregnant  Sexually transmitted diseases are not treated at the clinic.    Dental Care: Organization         Address  Phone  Notes  [facility] [address] [place], [place] [phone])  [phone] Accepts children up to age 80 who are enrolled in Medicaid or [place] Health Choice; pregnant women with a Medicaid card; and children who have applied for Medicaid or [place] Health Choice, but were declined, whose parents can pay a reduced fee at time of service.  [facility]  [address], [place] [phone] Accepts children up to age 5 who are enrolled in [place] or [place]; pregnant women with a Medicaid card; and children who have applied for Medicaid or [place] Health Choice, but were declined, whose parents can pay a reduced fee at time of service.  [place] Adult Dental Access PROGRAM  [place] [phone] Patients are seen by appointment only. Walk-ins are not accepted. [place] will see patients 2 years of age and older. Monday - Tuesday (8am-5pm) Most Wednesdays (8:30-5pm) $30 per visit, cash only  [facility] Adult Dental Access PROGRAM  [address], [facility] [phone] Patients are seen by appointment only. Walk-ins are not accepted. [place] will see patients 10 years of age and older. One Wednesday Evening (Monthly: Volunteer Based).  $30 per visit, cash only  [place]  [phone] for adults; Children under age 72, call Graduate  Pediatric Dentistry at [phone]. Children aged 60-14, please call [phone] to request a pediatric application.  Dental services are provided in all areas of dental care including fillings, crowns and bridges, complete and partial dentures, implants, gum treatment, root canals, and extractions. Preventive care is also provided. Treatment is provided to both adults and children. Patients are selected via a lottery and there is often a waiting list.   [facility] [address] [place]  [phone] [email or web address]   [facility] [address] [place], [place] [phone], Ext. [phone] Second and Fourth Thursday of each month, opens at 6:30 AM; Clinic ends at 9 AM.  Patients are seen on a first-come first-served basis, and a limited number are seen during each clinic.   [facility]  [address] [place]ard Danker [place], [place] [phone]   Eligibility Requirements You must have lived in [place], [place], or [place] counties for at least the last three months.   You cannot be eligible for state or federal sponsored [employer], including [employer], [place], or [employer].   You generally cannot be eligible for healthcare insurance through your employer.    How to apply: Eligibility screenings are held every Tuesday and Wednesday afternoon from 1:00 pm until 4:00 pm. You do not need an appointment for the interview!  [facility] [address] [place], [place]   [place]  [place] Department  [place]  [phone]    Behavioral Health Resources in the Community: Intensive Outpatient Programs Organization         Address  Phone  Notes  [place]. [address], [place], [place] [phone]   [facility] [address] [place], [place]   ADS: [facility] Drug [facility] [address], [place], [place]   [place] [address],  [place], [place] or [phone]   Substance Abuse Resources Organization         Address  Phone  Notes  Alcohol and Drug Services  [phone]  [place]  [phone]   [facility] [place]   Chinita Pester  [phone]   Residential & Outpatient Substance Abuse Program  [phone]   Psychological Services Organization         Address  Phone  Notes  [facility] [place]  [place]  [phone]   [place] [address] [place] or [phone]    Mobile Crisis Teams Organization         Address  Phone  Notes  [facility], Mobile Crisis Care Unit  [phone]   [facility]  [address] [place], [place]   [place] [address] [place] [phone]    Self-Help/Support Groups Organization         Address  Phone             Notes  [place]. of [place] - variety of support groups  [place] Call for more information  Narcotics Anonymous (NA), [facility] [address], [employer] [place]  2 meetings at this location   Special educational needs teacher         Address  Phone  Notes  [facility]idential [facility] [place],    [place]  [phone]   [facility]  [address], [place] [phone], [place], [place]   [place], [place] [phone] Admissions: 8am-3pm M-F  Incentives Substance [place] [address],    [place], [place] [phone]   [facility] [address] [place], [place], [place]   [facility] [address],  [place], [place]   [facility] - Intensive Outpatient [address] Kristeen [address], [place], [place]   [facility] ([place].,  [place], [place] [phone] or [phone]   [facility] (RTS) [address] [place], [place] Accepts Medicaid  [facility] [place] [address],  [place] [phone] Substance Abuse/Addiction Treatment   [facility] Organization         Address  Phone  Notes  [facility]  [phone]   Domenic Schwab, PhD [address] Arlis [place], [place]   [phone] or [phone]   [place], [place] [phone]   [facility] [address] [place], [place] [phone] Insurance/Medicaid/sponsorship through [facility] and Families [address], [address] [place]                                    [place], [place] [phone] [place] [address], [place] [phone]    Dr. Adele Schilder  [phone]   [facility] Dept. 1) [address], [place] 2) [place] 3)  [address], [place] [phone]  [phone]   [place] [phone] or [phone] (After Hours)

## 2015-09-14 NOTE — ED Notes (Addendum)
Pt has walked out of department and back in without notifying staff she was leaving.  No difficulty in ambulation noted.

## 2015-09-17 LAB — GC/CHLAMYDIA PROBE AMP (~~LOC~~) NOT AT ARMC
Chlamydia: NEGATIVE
NEISSERIA GONORRHEA: NEGATIVE

## 2015-09-17 MED FILL — Hydrocodone-Acetaminophen Tab 5-325 MG: ORAL | Qty: 6 | Status: AC

## 2015-09-19 LAB — CULTURE, BLOOD (ROUTINE X 2)
Culture: NO GROWTH
Culture: NO GROWTH

## 2015-12-29 ENCOUNTER — Encounter (HOSPITAL_COMMUNITY): Payer: Self-pay

## 2015-12-29 ENCOUNTER — Emergency Department (HOSPITAL_COMMUNITY)
Admission: EM | Admit: 2015-12-29 | Discharge: 2015-12-29 | Disposition: A | Discharge status: Home / Self Care | Payer: No Typology Code available for payment source | Attending: Emergency Medicine | Admitting: Emergency Medicine

## 2015-12-29 DIAGNOSIS — F17210 Nicotine dependence, cigarettes, uncomplicated: Secondary | ICD-10-CM | POA: Insufficient documentation

## 2015-12-29 DIAGNOSIS — K029 Dental caries, unspecified: Secondary | ICD-10-CM | POA: Insufficient documentation

## 2015-12-29 MED ORDER — TRAMADOL HCL 50 MG PO TABS: 100.0000 mg | ORAL_TABLET | Freq: Four times a day (QID) | ORAL | Status: DC | PRN

## 2015-12-29 MED ORDER — ACETAMINOPHEN 500 MG PO TABS
1000.0000 mg | ORAL_TABLET | Freq: Once | ORAL | Status: AC
  Administered 2015-12-29: 1000 mg via ORAL
  Filled 2015-12-29: qty 2

## 2015-12-29 MED ORDER — CLINDAMYCIN HCL 150 MG PO CAPS
300.0000 mg | ORAL_CAPSULE | Freq: Once | ORAL | Status: AC
Start: 2015-12-29 — End: 2015-12-29
  Administered 2015-12-29: 300 mg via ORAL
  Filled 2015-12-29: qty 2

## 2015-12-29 MED ORDER — TRAMADOL HCL 50 MG PO TABS
100.0000 mg | ORAL_TABLET | Freq: Once | ORAL | Status: AC
  Administered 2015-12-29: 100 mg via ORAL
  Filled 2015-12-29: qty 2

## 2015-12-29 MED ORDER — CLINDAMYCIN HCL 300 MG PO CAPS: 300.0000 mg | ORAL_CAPSULE | Freq: Four times a day (QID) | ORAL | Status: DC

## 2015-12-29 NOTE — ED Notes (Signed)
In to give medications, informed patient of the medication she was getting and their use. Patient looked at each pill for imprint/ asked numerous times what they were. I explained to patient again what they were, explained again. Patient asking to talk to Dr. Tomi Bamberger about getting her prescriptions on separate papers because she couldn't afford the antibiotic, she could only afford the pain medication. Relayed this information to Dr. Tomi Bamberger, who denied request. Patient upset at discharge that her request was denied and that she was only given Ultram prescription when she's used to taking 10 mg Oxycodone.

## 2015-12-29 NOTE — ED Notes (Signed)
Upper left dental pain x several days.

## 2015-12-29 NOTE — ED Provider Notes (Signed)
CSN: [id number]     Arrival date & time 12/29/15  0501 History   First MD Initiated Contact with Patient 12/29/15 0520  AM   Chief Complaint  Patient presents with  . Dental Pain     (Consider location/radiation/quality/duration/timing/severity/associated sxs/prior Treatment) HPI patient reports she had chipped her left upper tooth in the past and about 2 days ago it checked again. She has been having pain in that tooth since. She states tonight when she flushed she felt like she smoke pus. She feels like something is draining down her left throat. She states 2 nights ago she had a fever of 100. She's had some nausea without vomiting. She does not have any difficulty swallowing or breathing. She states her sister is a Copywriter, advertising and she can get into see her dentist sometime this coming week. She also told her to get an antibiotic and the patient states the name that is a combination of clindamycin and doxycycline.    PCP none Dentist none  Past Medical History  Diagnosis Date  . Back pain   . Anxiety    Past Surgical History  Procedure Laterality Date  . Orthopedic surgery    . Cholecystectomy     No family history on file. Social History  Substance Use Topics  . Smoking status: Current Some Day Smoker -- 0.25 packs/day for 5 years    Types: Cigarettes    Last Attempt to Quit: 09/18/2013  . Smokeless tobacco: Never Used  . 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     Allergies  Hydrocodone; Sulfa antibiotics; and Morphine and related  Home Medications   Prior to Admission medications   Medication Sig Start Date End Date Taking? Authorizing Provider  albuterol (VENTOLIN HFA) 108 (90 BASE) MCG/ACT inhaler Inhale 2 puffs into the lungs every  6 (six) hours as needed. For shortness of breath    Historical Provider, MD  clindamycin (CLEOCIN) 300 MG capsule Take 1 capsule (300 mg total) by mouth 4 (four) times daily. 12/29/15   Rolland Porter, MD  doxycycline (VIBRAMYCIN) 100 MG capsule Take 1 capsule (100 mg total) by mouth 2 (two) times daily. 09/14/15   Kristen N Ward, DO  HYDROcodone-acetaminophen (NORCO/VICODIN) 5-325 MG tablet Take 1-2 tablets by mouth every 6 (six) hours as needed. 09/14/15   Kristen N Ward, DO  HYDROcodone-acetaminophen (NORCO/VICODIN) 5-325 MG tablet Take 1-2 tablets by mouth every 6 (six) hours as needed. 09/14/15   Kristen N Ward, DO  ondansetron (ZOFRAN ODT) 4 MG disintegrating tablet Take 1 tablet (4 mg total) by mouth every 8 (eight) hours as needed for nausea or vomiting. 09/14/15   Kristen N Ward, DO  traMADol (ULTRAM) 50 MG tablet Take 2 tablets (100 mg total) by mouth every 6 (six) hours as needed. 12/29/15   Rolland Porter, MD   BP 126/89 mmHg  Pulse 89  Temp(Src) 98.9 F (37.2 C) (Oral)  Resp 16  Ht 5\' 4"  (1.626 m)  Wt 125 lb (56.7 kg)  BMI 21.45 kg/m2  SpO2 98%  LMP 12/21/2015  Vital signs normal   Physical Exam  Constitutional: She is oriented to person, place, and time.  She appears well-developed and well-nourished.  Non-toxic appearance. She does not appear ill. No distress.  HENT:  Head: Normocephalic and atraumatic.  Right Ear: External ear normal.  Left Ear: External ear normal.  Nose: Nose normal. No mucosal edema or rhinorrhea.  Mouth/Throat: Oropharynx is clear and moist and mucous membranes are normal. No dental abscesses or uvula swelling.    Eyes: Conjunctivae and EOM are normal. Pupils are equal, round, and reactive to light.  Neck: Normal range of motion and full passive range of motion without pain. Neck supple.  Pulmonary/Chest: Effort normal. No respiratory distress. She has no rhonchi. She exhibits no crepitus.  Abdominal: Normal appearance.  Musculoskeletal: Normal range of motion.  She exhibits no edema or tenderness.  Moves all extremities well.   Neurological: She is alert and oriented to person, place, and time. She has normal strength. No cranial nerve deficit.  Skin: Skin is warm, dry and intact. No rash noted. No erythema. No pallor.  Psychiatric: She has a normal mood and affect. Her speech is normal and behavior is normal. Her mood appears not anxious.  Nursing note and vitals reviewed.   ED Course  Procedures (including critical care time)  Medications  clindamycin (CLEOCIN) capsule 300 mg (not administered)  traMADol (ULTRAM) tablet 100 mg (not administered)  acetaminophen (TYLENOL) tablet 1,000 mg (not administered)   Patient states her sister told her to get on antibiotic and gives the name that seems to be a combination of clindamycin and doxycycline. She has no insurance and she was advised to be very expensive. She states she had gotten doxycycline recently, looking at her chart it was in January, and it costs $60 which she states "was worth it".   MDM   Final diagnoses:  Dental cavity   New Prescriptions   CLINDAMYCIN (CLEOCIN) 300 MG CAPSULE    Take 1 capsule (300 mg total) by mouth 4 (four) times daily.   TRAMADOL (ULTRAM) 50 MG TABLET    Take 2 tablets (100 mg total) by mouth every 6 (six) hours as needed.    Plan discharge  Rolland Porter, MD, Barbette Or, MD 12/29/15 [phone]

## 2015-12-29 NOTE — Discharge Instructions (Signed)
Take the antibiotic until gone. Take the tramadol with acetaminophen 650 mg 4 times a day for pain. You can also take ibuprofen 600 mg 4 times a day OR Aleve 2 tabs twice a day for pain. Follow up with Dr Andree Elk this week. Recheck if you get a fever, have difficulty breathing or swallowing.    Dental Caries Dental caries (also called tooth decay) is the most common oral disease. It can occur at any age but is more common in children and young adults.  HOW DENTAL CARIES DEVELOPS  The process of decay begins when bacteria and foods (particularly sugars and starches) combine in your mouth to produce plaque. Plaque is a substance that sticks to the hard, outer surface of a tooth (enamel). The bacteria in plaque produce acids that attack enamel. These acids may also attack the root surface of a tooth (cementum) if it is exposed. Repeated attacks dissolve these surfaces and create holes in the tooth (cavities). If left untreated, the acids destroy the other layers of the tooth.  RISK FACTORS  Frequent sipping of sugary beverages.   Frequent snacking on sugary and starchy foods, especially those that easily get stuck in the teeth.   Poor oral hygiene.   Dry mouth.   Substance abuse such as methamphetamine abuse.   Broken or poor-fitting dental restorations.   Eating disorders.   Gastroesophageal reflux disease (GERD).   Certain radiation treatments to the head and neck. SYMPTOMS In the early stages of dental caries, symptoms are seldom present. Sometimes white, chalky areas may be seen on the enamel or other tooth layers. In later stages, symptoms may include:  Pits and holes on the enamel.  Toothache after sweet, hot, or cold foods or drinks are consumed.  Pain around the tooth.  Swelling around the tooth. DIAGNOSIS  Most of the time, dental caries is detected during a regular dental checkup. A diagnosis is made after a thorough medical and dental history is taken and the  surfaces of your teeth are checked for signs of dental caries. Sometimes special instruments, such as lasers, are used to check for dental caries. Dental X-ray exams may be taken so that areas not visible to the eye (such as between the contact areas of the teeth) can be checked for cavities.  TREATMENT  If dental caries is in its early stages, it may be reversed with a fluoride treatment or an application of a remineralizing agent at the dental office. Thorough brushing and flossing at home is needed to aid these treatments. If it is in its later stages, treatment depends on the location and extent of tooth destruction:   If a small area of the tooth has been destroyed, the destroyed area will be removed and cavities will be filled with a material such as gold, silver amalgam, or composite resin.   If a large area of the tooth has been destroyed, the destroyed area will be removed and a cap (crown) will be fitted over the remaining tooth structure.   If the center part of the tooth (pulp) is affected, a procedure called a root canal will be needed before a filling or crown can be placed.   If most of the tooth has been destroyed, the tooth may need to be pulled (extracted). HOME CARE INSTRUCTIONS You can prevent, stop, or reverse dental caries at home by practicing good oral hygiene. Good oral hygiene includes:  Thoroughly cleaning your teeth at least twice a day with a toothbrush and dental  floss.   Using a fluoride toothpaste. A fluoride mouth rinse may also be used if recommended by your dentist or health care provider.   Restricting the amount of sugary and starchy foods and sugary liquids you consume.   Avoiding frequent snacking on these foods and sipping of these liquids.   Keeping regular visits with a dentist for checkups and cleanings. PREVENTION   Practice good oral hygiene.  Consider a dental sealant. A dental sealant is a coating material that is applied by your  dentist to the pits and grooves of teeth. The sealant prevents food from being trapped in them. It may protect the teeth for several years.  Ask about fluoride supplements if you live in a community without fluorinated water or with water that has a low fluoride content. Use fluoride supplements as directed by your dentist or health care provider.  Allow fluoride varnish applications to teeth if directed by your dentist or health care provider.   This information is not intended to replace advice given to you by your health care provider. Make sure you discuss any questions you have with your health care provider.   Document Released: 04/26/2002 Document Revised: 08/25/2014 Document Reviewed: 08/06/2012 Elsevier Interactive Patient Education [employer].

## 2016-02-09 ENCOUNTER — Encounter (HOSPITAL_COMMUNITY): Payer: Self-pay | Admitting: *Deleted

## 2016-02-09 ENCOUNTER — Emergency Department (HOSPITAL_COMMUNITY)
Admission: EM | Admit: 2016-02-09 | Discharge: 2016-02-10 | Disposition: A | Discharge status: Home / Self Care | Payer: No Typology Code available for payment source | Attending: Emergency Medicine | Admitting: Emergency Medicine

## 2016-02-09 DIAGNOSIS — Y999 Unspecified external cause status: Secondary | ICD-10-CM | POA: Insufficient documentation

## 2016-02-09 DIAGNOSIS — L089 Local infection of the skin and subcutaneous tissue, unspecified: Secondary | ICD-10-CM

## 2016-02-09 DIAGNOSIS — Y929 Unspecified place or not applicable: Secondary | ICD-10-CM | POA: Insufficient documentation

## 2016-02-09 DIAGNOSIS — Y939 Activity, unspecified: Secondary | ICD-10-CM | POA: Insufficient documentation

## 2016-02-09 DIAGNOSIS — F17210 Nicotine dependence, cigarettes, uncomplicated: Secondary | ICD-10-CM | POA: Insufficient documentation

## 2016-02-09 DIAGNOSIS — X58XXXA Exposure to other specified factors, initial encounter: Secondary | ICD-10-CM | POA: Insufficient documentation

## 2016-02-09 DIAGNOSIS — W57XXXA Bitten or stung by nonvenomous insect and other nonvenomous arthropods, initial encounter: Secondary | ICD-10-CM

## 2016-02-09 DIAGNOSIS — L989 Disorder of the skin and subcutaneous tissue, unspecified: Secondary | ICD-10-CM | POA: Insufficient documentation

## 2016-02-09 MED ORDER — DOXYCYCLINE HYCLATE 100 MG PO TABS
100.0000 mg | ORAL_TABLET | Freq: Once | ORAL | Status: AC
  Administered 2016-02-09: 100 mg via ORAL
  Filled 2016-02-09: qty 1

## 2016-02-09 MED ORDER — PROCHLORPERAZINE MALEATE 5 MG PO TABS
10.0000 mg | ORAL_TABLET | Freq: Once | ORAL | Status: AC
  Administered 2016-02-09: 10 mg via ORAL
  Filled 2016-02-09: qty 2

## 2016-02-09 MED ORDER — KETOROLAC TROMETHAMINE 60 MG/2ML IM SOLN
60.0000 mg | Freq: Once | INTRAMUSCULAR | Status: AC
  Administered 2016-02-09: 60 mg via INTRAMUSCULAR
  Filled 2016-02-09: qty 2

## 2016-02-09 MED ORDER — TRAMADOL HCL 50 MG PO TABS: ORAL_TABLET | ORAL | Status: DC

## 2016-02-09 MED ORDER — LIDOCAINE HCL (PF) 1 % IJ SOLN
INTRAMUSCULAR | Status: AC
  Filled 2016-02-09: qty 5

## 2016-02-09 MED ORDER — DEXAMETHASONE 4 MG PO TABS: 4.0000 mg | ORAL_TABLET | Freq: Two times a day (BID) | ORAL | Status: DC

## 2016-02-09 MED ORDER — CEFTRIAXONE SODIUM 1 G IJ SOLR
1.0000 g | Freq: Once | INTRAMUSCULAR | Status: AC
  Administered 2016-02-09: 1 g via INTRAMUSCULAR
  Filled 2016-02-09: qty 10

## 2016-02-09 MED ORDER — DICLOFENAC SODIUM 75 MG PO TBEC: 75.0000 mg | DELAYED_RELEASE_TABLET | Freq: Two times a day (BID) | ORAL | Status: DC

## 2016-02-09 MED ORDER — DOXYCYCLINE HYCLATE 100 MG PO CAPS: 100.0000 mg | ORAL_CAPSULE | Freq: Two times a day (BID) | ORAL | Status: DC

## 2016-02-09 NOTE — ED Provider Notes (Signed)
CSN: [id number]     Arrival date & time 02/09/16  2205 History   First MD Initiated Contact with Patient 02/09/16 2224     Chief Complaint  Patient presents with  . Knee Injury     (Consider location/radiation/quality/duration/timing/severity/associated sxs/prior Treatment) HPI Comments: Patient is a 40 year old female who presents to the emergency department with a complaint of pain and redness of the left knee.  The patient states that January 21 she pulled a tick off of her left knee. She states that she noted some increased redness and some swelling, she lit the end of the needle and stuck it in the area to try to help it drain. Following this she noticed increased redness of the side of her knee. She states that she has pain when she walks. She's not had any fever or chills related to this. She's not had any nausea or vomiting. There's been no previous operations or procedures involving the left knee.  The history is provided by the patient.    Past Medical History  Diagnosis Date  . Back pain   . Anxiety    Past Surgical History  Procedure Laterality Date  . Orthopedic surgery    . Cholecystectomy     History reviewed. No pertinent family history. Social History  Substance Use Topics  . Smoking status: Current Some Day Smoker -- 0.25 packs/day for 5 years    Types: Cigarettes    Last Attempt to Quit: 09/18/2013  . Smokeless tobacco: Never Used  . Alcohol Use: No     Comment: occasionally   OB History    Gravida Para Term Preterm AB TAB SAB Ectopic Multiple Living   3 2 2  1  1   2      Review of Systems  Musculoskeletal: Positive for back pain and arthralgias.  Psychiatric/Behavioral: The patient is nervous/anxious.   All other systems reviewed and are negative.     Allergies  Hydrocodone; Sulfa antibiotics; and Morphine and related  Home Medications   Prior to Admission medications   Medication Sig Start Date End Date Taking? Authorizing Provider   albuterol (VENTOLIN HFA) 108 (90 BASE) MCG/ACT inhaler Inhale 2 puffs into the lungs every 6 (six) hours as needed. For shortness of breath    Historical Provider, MD  clindamycin (CLEOCIN) 300 MG capsule Take 1 capsule (300 mg total) by mouth 4 (four) times daily. 12/29/15   Rolland Porter, MD  doxycycline (VIBRAMYCIN) 100 MG capsule Take 1 capsule (100 mg total) by mouth 2 (two) times daily. 09/14/15   Kristen N Ward, DO  HYDROcodone-acetaminophen (NORCO/VICODIN) 5-325 MG tablet Take 1-2 tablets by mouth every 6 (six) hours as needed. 09/14/15   Kristen N Ward, DO  HYDROcodone-acetaminophen (NORCO/VICODIN) 5-325 MG tablet Take 1-2 tablets by mouth every 6 (six) hours as needed. 09/14/15   Kristen N Ward, DO  ondansetron (ZOFRAN ODT) 4 MG disintegrating tablet Take 1 tablet (4 mg total) by mouth every 8 (eight) hours as needed for nausea or vomiting. 09/14/15   Kristen N Ward, DO  traMADol (ULTRAM) 50 MG tablet Take 2 tablets (100 mg total) by mouth every 6 (six) hours as needed. 12/29/15   Rolland Porter, MD   BP 134/86 mmHg  Pulse 95  Temp(Src) 98.6 F (37 C) (Oral)  Resp 18  Ht 5\' 4"  (1.626 m)  Wt 56.7 kg  BMI 21.45 kg/m2  SpO2 100%  LMP 02/08/2016 Physical Exam  Constitutional: She is oriented to person, place, and time. She  appears well-developed and well-nourished.  Non-toxic appearance.  HENT:  Head: Normocephalic.  Right Ear: Tympanic membrane and external ear normal.  Left Ear: Tympanic membrane and external ear normal.  Eyes: EOM and lids are normal. Pupils are equal, round, and reactive to light.  Neck: Normal range of motion. Neck supple. Carotid bruit is not present.  Cardiovascular: Normal rate, regular rhythm, normal heart sounds, intact distal pulses and normal pulses.   Pulmonary/Chest: Breath sounds normal. No respiratory distress.  Abdominal: Soft. Bowel sounds are normal. There is no tenderness. There is no guarding.  Musculoskeletal: Normal range of motion.  There are no  palpable nodes in the left inguinal area. There is an area of increased redness at the lateral surface of the left knee. There is a scabbed area present within the middle of the reddened area. There is no effusion appreciated of the joint. There is no posterior mass, or swelling appreciated. The patellas in the midline, and there is no increased warmth or redness involving the patella tendon. There is no red streaking appreciated related to the reddened area on the lateral surface. The dorsalis pedis is 2+. The capillary refill is less than 2 seconds on the left. There is full range of motion of the right lower extremity.  Lymphadenopathy:       Head (right side): No submandibular adenopathy present.       Head (left side): No submandibular adenopathy present.    She has no cervical adenopathy.  Neurological: She is alert and oriented to person, place, and time. She has normal strength. No cranial nerve deficit or sensory deficit.  Skin: Skin is warm and dry.  Psychiatric: She has a normal mood and affect. Her speech is normal.  Nursing note and vitals reviewed.   ED Course  Procedures (including critical care time) Labs Review Labs Reviewed - No data to display  Imaging Review No results found. I have personally reviewed and evaluated these images and lab results as part of my medical decision-making.   EKG Interpretation None      MDM  Vital signs are well within normal limits. The patient sustained a tick bite, as well as inserted a needle in the bite area. She has some increased redness present. There is no fluctuance appreciated of the reddened area on. There is no red streaking appreciated, and there is no effusion of the joint on. The patient will be treated with doxycycline, diclofenac, and Ultram.    Final diagnoses:  None    *I have reviewed nursing notes, vital signs, and all appropriate lab and imaging results for this patient.[address] PA-C 02/09/16  2346  Noemi Chapel, MD 02/10/16 1539

## 2016-02-09 NOTE — Discharge Instructions (Signed)
Please use warm Epsom salt tub soaks daily until the wound heals. Please apply a Band-Aid to prevent clothing from irritating the area. Please use the Ace bandage for additional support. Use doxycycline and diclofenac daily with food until all taken. May use Ultram for pain not improved by Tylenol or ibuprofen. Please see the physicians at the [facility] free clinic for additional follow-up and management. Return to the emergency department if any high fever, red streaking going up the leg, or signs of advancing infection. Tick Bite Information Ticks are insects that attach themselves to the skin. There are many types of ticks. Common types include wood ticks and deer ticks. Sometimes, ticks carry diseases that can make a person very ill. The most common places for ticks to attach themselves are the scalp, neck, armpits, waist, and groin.  HOW CAN YOU PREVENT TICK BITES? Take these steps to help prevent tick bites when you are outdoors:  Wear long sleeves and long pants.  Wear white clothes so you can see ticks more easily.  Tuck your pant legs into your socks.  If walking on a trail, stay in the middle of the trail to avoid brushing against bushes.  Avoid walking through areas with long grass.  Put bug spray on all skin that is showing and along boot tops, pant legs, and sleeve cuffs.  Check clothes, hair, and skin often and before going inside.  Brush off any ticks that are not attached.  Take a shower or bath as soon as possible after being outdoors. HOW SHOULD YOU REMOVE A TICK? Ticks should be removed as soon as possible to help prevent diseases. 1. If latex gloves are available, put them on before trying to remove a tick. 2. Use tweezers to grasp the tick as close to the skin as possible. You may also use curved forceps or a tick removal tool. Grasp the tick as close to its head as possible. Avoid grasping the tick on its body. 3. Pull gently upward until the tick lets go. Do not  twist the tick or jerk it suddenly. This may break off the tick's head or mouth parts. 4. Do not squeeze or crush the tick's body. This could force disease-carrying fluids from the tick into your body. 5. After the tick is removed, wash the bite area and your hands with soap and water or alcohol. 6. Apply a small amount of antiseptic cream or ointment to the bite site. 7. Wash any tools that were used. Do not try to remove a tick by applying a hot match, petroleum jelly, or fingernail polish to the tick. These methods do not work. They may also increase the chances of disease being spread from the tick bite. WHEN SHOULD YOU SEEK HELP? Contact your health care provider if you are unable to remove a tick or if a part of the tick breaks off in the skin. After a tick bite, you need to watch for signs and symptoms of diseases that can be spread by ticks. Contact your health care provider if you develop any of the following:  Fever.  Rash.  Redness and puffiness (swelling) in the area of the tick bite.  Tender, puffy lymph glands.  Watery poop (diarrhea).  Weight loss.  Cough.  Feeling more tired than normal (fatigue).  Muscle, joint, or bone pain.  Belly (abdominal) pain.  Headache.  Change in your level of consciousness.  Trouble walking or moving your legs.  Loss of feeling (numbness) in the legs.  Loss of movement (paralysis).  Shortness of breath.  Confusion.  Throwing up (vomiting) many times.   This information is not intended to replace advice given to you by your health care provider. Make sure you discuss any questions you have with your health care provider.   Document Released: 10/29/2009 Document Revised: 04/06/2013 Document Reviewed: 01/12/2013 Elsevier Interactive Patient Education [employer].

## 2016-02-09 NOTE — ED Notes (Signed)
Pt states she pulled a tick of of her left knee on Wednesday. Pt has a small amount of redness to left knee. Pt states it hurts to walk.

## 2016-02-10 ENCOUNTER — Encounter (HOSPITAL_COMMUNITY): Payer: Self-pay | Admitting: *Deleted

## 2016-02-10 ENCOUNTER — Emergency Department (HOSPITAL_COMMUNITY)
Admission: EM | Admit: 2016-02-10 | Discharge: 2016-02-11 | Disposition: A | Discharge status: Home / Self Care | Payer: No Typology Code available for payment source | Attending: Emergency Medicine | Admitting: Emergency Medicine

## 2016-02-10 DIAGNOSIS — Z7951 Long term (current) use of inhaled steroids: Secondary | ICD-10-CM | POA: Insufficient documentation

## 2016-02-10 DIAGNOSIS — L0889 Other specified local infections of the skin and subcutaneous tissue: Secondary | ICD-10-CM | POA: Insufficient documentation

## 2016-02-10 DIAGNOSIS — R51 Headache: Secondary | ICD-10-CM | POA: Insufficient documentation

## 2016-02-10 DIAGNOSIS — R112 Nausea with vomiting, unspecified: Secondary | ICD-10-CM | POA: Insufficient documentation

## 2016-02-10 DIAGNOSIS — L089 Local infection of the skin and subcutaneous tissue, unspecified: Secondary | ICD-10-CM

## 2016-02-10 DIAGNOSIS — H539 Unspecified visual disturbance: Secondary | ICD-10-CM | POA: Insufficient documentation

## 2016-02-10 DIAGNOSIS — F17210 Nicotine dependence, cigarettes, uncomplicated: Secondary | ICD-10-CM | POA: Insufficient documentation

## 2016-02-10 DIAGNOSIS — R197 Diarrhea, unspecified: Secondary | ICD-10-CM | POA: Insufficient documentation

## 2016-02-10 DIAGNOSIS — Z792 Long term (current) use of antibiotics: Secondary | ICD-10-CM | POA: Insufficient documentation

## 2016-02-10 MED ORDER — VANCOMYCIN HCL IN DEXTROSE 1-5 GM/200ML-% IV SOLN
1000.0000 mg | Freq: Once | INTRAVENOUS | Status: AC
  Administered 2016-02-10: 1000 mg via INTRAVENOUS
  Filled 2016-02-10: qty 200

## 2016-02-10 MED ORDER — ONDANSETRON HCL 4 MG/2ML IJ SOLN
4.0000 mg | Freq: Once | INTRAMUSCULAR | Status: AC
  Administered 2016-02-10: 4 mg via INTRAVENOUS
  Filled 2016-02-10: qty 2

## 2016-02-10 MED ORDER — SODIUM CHLORIDE 0.9 % IV SOLN: INTRAVENOUS | Status: DC

## 2016-02-10 MED ORDER — HYDROMORPHONE HCL 1 MG/ML IJ SOLN
1.0000 mg | Freq: Once | INTRAMUSCULAR | Status: AC
  Administered 2016-02-10: 1 mg via INTRAVENOUS
  Filled 2016-02-10: qty 1

## 2016-02-10 MED ORDER — SODIUM CHLORIDE 0.9 % IV BOLUS (SEPSIS)
250.0000 mL | Freq: Once | INTRAVENOUS | Status: AC
  Administered 2016-02-10: 250 mL via INTRAVENOUS

## 2016-02-10 NOTE — ED Provider Notes (Signed)
CSN: [id number]     Arrival date & time 02/10/16  1946 History  By signing my name below, I, Jasmyn B. Alexander, attest that this documentation has been prepared under the direction and in the presence of Fredia Sorrow, MD.  Electronically Signed: Tedra Coupe. Sheppard Coil, ED Scribe. 02/10/2016. 11:23 PM.   Chief Complaint  Patient presents with  . Knee Pain   Patient is a 40 y.o. female presenting with knee pain. The history is provided by the patient. No language interpreter was used.  Knee Pain Location:  Knee Time since incident:  4 days Pain details:    Quality:  Sharp   Severity:  Severe   Onset quality:  Gradual   Timing:  Constant   Progression:  Worsening Chronicity:  New Dislocation: no   Worsened by:  Bearing weight and flexion Associated symptoms: back pain and fever    HPI Comments: Sara [name] is a 40 y.o. female who presents to the Emergency Department complaining of gradual onset, constant, worsening, severe left knee pain s/p tick bite x 4 days. Pt states that she was bit by a tick on 02/06/16 and it was not attached for longer than 36 hours. She states when she removed tick from knee, she drained purulent drainage from the knee with a burned needle, and applied both A&D ointment and a hot compress with mild relief. Pt was seen in [facility] on 02/09/16 for same chief complaint. She states she was unable to get Doxycycline prescription filled but she was able to take 4 Doxycycline tablets PTA that she had from previous prescription. She has associated redness, swelling, and increased warmth of the left knee surrounding bite area. She has taken Ultram with no relief. She states pain is exacerbated while walking and flexion of the knee.  Pt states she had a fever PTA and took Ibuprofen, Aleve, and Tylenol with relief.   Past Medical History  Diagnosis Date  . Back pain   . Anxiety    Past Surgical History  Procedure Laterality Date  . Orthopedic surgery    . Cholecystectomy      No family history on file. Social History  Substance Use Topics  . Smoking status: Current Some Day Smoker -- 0.25 packs/day for 5 years    Types: Cigarettes    Last Attempt to Quit: 09/18/2013  . Smokeless tobacco: Never Used  . Alcohol Use: No     Comment: occasionally   OB History    Gravida Para Term Preterm AB TAB SAB Ectopic Multiple Living   3 2 2  1  1   2      Review of Systems  Constitutional: Positive for fever and chills.  HENT: Negative for congestion, rhinorrhea and sore throat.   Eyes: Positive for visual disturbance.  Respiratory: Negative for cough and shortness of breath.   Cardiovascular: Positive for leg swelling. Negative for chest pain.  Gastrointestinal: Positive for nausea, vomiting and diarrhea. Negative for abdominal pain.  Genitourinary: Negative for dysuria and hematuria.  Musculoskeletal: Positive for back pain.  Skin: Positive for rash.  Neurological: Positive for headaches.  Hematological: Does not bruise/bleed easily.  Psychiatric/Behavioral: Negative for decreased concentration.    Allergies  Hydrocodone; Sulfa antibiotics; and Morphine and related  Home Medications   Prior to Admission medications   Medication Sig Start Date End Date Taking? Authorizing Provider  albuterol (VENTOLIN HFA) 108 (90 BASE) MCG/ACT inhaler Inhale 2 puffs into the lungs every 6 (six) hours as needed. For shortness  of breath   Yes Historical Provider, MD  doxycycline (VIBRAMYCIN) 100 MG capsule Take 1 capsule (100 mg total) by mouth 2 (two) times daily. 02/09/16  Yes Lily Kocher, PA-C  traMADol (ULTRAM) 50 MG tablet 1 or 2 po q6h prn pain, take with food Patient taking differently: Take 50-100 mg by mouth every 6 (six) hours as needed for moderate pain or severe pain.  02/09/16  Yes Lily Kocher, PA-C  clindamycin (CLEOCIN) 300 MG capsule Take 1 capsule (300 mg total) by mouth 4 (four) times daily. Patient not taking: Reported on 02/10/2016 12/29/15   Rolland Porter,  MD  diclofenac (VOLTAREN) 75 MG EC tablet Take 1 tablet (75 mg total) by mouth 2 (two) times daily. 02/09/16   Lily Kocher, PA-C   BP 124/89 mmHg  Pulse 77  Temp(Src) 98.5 F (36.9 C) (Oral)  Resp 16  Ht 5\' 4"  (1.626 m)  Wt 56.7 kg  BMI 21.45 kg/m2  SpO2 100%  LMP 02/08/2016 Physical Exam  Constitutional: She is oriented to person, place, and time. She appears well-developed and well-nourished. No distress.  HENT:  Head: Normocephalic and atraumatic.  Mouth/Throat: Oropharynx is clear and moist.  Eyes: Conjunctivae and EOM are normal. Pupils are equal, round, and reactive to light. No scleral icterus.  Cardiovascular: Normal rate, normal heart sounds and intact distal pulses.   Tachycardic. Capillary refill to both big toes is 1 second. Posterior tibialis 2+. Dorsalis pedis pulse 2+.   Pulmonary/Chest: Effort normal and breath sounds normal.  Abdominal: Bowel sounds are normal. She exhibits no distension. There is no tenderness.  Musculoskeletal: She exhibits no edema.  No pitting edema. No joint effusion of left leg.   Neurological: She is alert and oriented to person, place, and time. No cranial nerve deficit. She exhibits normal muscle tone. Coordination normal.  Skin: Skin is warm and dry. There is erythema.  Increased warmth on left leg. Area of redness measuring about 5cm. Black area of 20mm. Skin healing area about 47mm. Area of fluctuance about 61mm. Induration about 2cm.   Psychiatric: She has a normal mood and affect.  Nursing note and vitals reviewed.   ED Course  Procedures (including critical care time) DIAGNOSTIC STUDIES: Oxygen Saturation is 100% on RA, normal by my interpretation.    COORDINATION OF CARE: 11:09 PM-Discussed treatment plan which includes BMP and CBC with pt at bedside and pt agreed to plan. Will order Zofran, Dilaudid, and Vancomycin.  Labs Review Labs Reviewed  BASIC METABOLIC PANEL - Abnormal; Notable for the following:    BUN 22 (*)     Calcium 8.5 (*)    Anion gap 4 (*)    All other components within normal limits  CBC - Abnormal; Notable for the following:    WBC 12.6 (*)    All other components within normal limits   Results for orders placed or performed during the hospital encounter of [id number]  Basic metabolic panel  Result Value Ref Range   Sodium 137 135 - 145 mmol/L   Potassium 3.6 3.5 - 5.1 mmol/L   Chloride 107 101 - 111 mmol/L   CO2 26 22 - 32 mmol/L   Glucose, Bld 85 65 - 99 mg/dL   BUN 22 (H) 6 - 20 mg/dL   Creatinine, Ser 0.77 0.44 - 1.00 mg/dL   Calcium 8.5 (L) 8.9 - 10.3 mg/dL   GFR calc non Af Amer >60 >60 mL/min   GFR calc Af Amer >60 >60 mL/min   Anion  gap 4 (L) 5 - 15  CBC  Result Value Ref Range   WBC 12.6 (H) 4.0 - 10.5 K/uL   RBC 3.97 3.87 - 5.11 MIL/uL   Hemoglobin 12.5 12.0 - 15.0 g/dL   HCT 38.2 36.0 - 46.0 %   MCV 96.2 78.0 - 100.0 fL   MCH 31.5 26.0 - 34.0 pg   MCHC 32.7 30.0 - 36.0 g/dL   RDW 13.0 11.5 - 15.5 %   Platelets 289 150 - 400 K/uL      MDM   Final diagnoses:  Infection of skin of knee    Patient status post tick bite to left knee with secondary infection. Tick was not engorged was not on the knee for more than 24 hours. Therefore unlikely to have any transmission of a tickborne illness. But does appear to be secondarily infected. No distinct deep abscess. No evidence of any joint infection or joint effusion. Seems to be localized to the skin on the lateral aspect of the left knee. Patient took some old doxycycline that she had at home she did not get her prescription filled of which was given to her when she was seen yesterday in the emergency department. Patient states that tramadol is not helping the pain.  Here today she arrived tachycardic but thinks this was due to anxiety. Heart rate is now down to 77. There is no fevers. No systemic symptoms. Patient's pain and some improvement. Patient receiving 1 dose of IV vancomycin.  Patient will continue her  doxycycline at home. Will continue warm compresses to the knee. Will return for follow-up in 24 hours.  I personally performed the services described in this documentation, which was scribed in my presence. The recorded information has been reviewed and is accurate.      Fredia Sorrow, MD 02/11/16 [phone]

## 2016-02-10 NOTE — ED Notes (Signed)
Pt states that she was seen in er last night, was not happy with the care that she received, reports that she feels that the area on her knee should have been lanced and a culture sent off. Pt anxious in triage,

## 2016-02-11 LAB — CBC
HEMATOCRIT: 38.2 % (ref 36.0–46.0)
HEMOGLOBIN: 12.5 g/dL (ref 12.0–15.0)
MCH: 31.5 pg (ref 26.0–34.0)
MCHC: 32.7 g/dL (ref 30.0–36.0)
MCV: 96.2 fL (ref 78.0–100.0)
Platelets: 289 10*3/uL (ref 150–400)
RBC: 3.97 MIL/uL (ref 3.87–5.11)
RDW: 13 % (ref 11.5–15.5)
WBC: 12.6 10*3/uL — AB (ref 4.0–10.5)

## 2016-02-11 LAB — BASIC METABOLIC PANEL
ANION GAP: 4 — AB (ref 5–15)
BUN: 22 mg/dL — ABNORMAL HIGH (ref 6–20)
CHLORIDE: 107 mmol/L (ref 101–111)
CO2: 26 mmol/L (ref 22–32)
Calcium: 8.5 mg/dL — ABNORMAL LOW (ref 8.9–10.3)
Creatinine, Ser: 0.77 mg/dL (ref 0.44–1.00)
GFR calc Af Amer: >60 mL/min (ref >60)
GLUCOSE: 85 mg/dL (ref 65–99)
POTASSIUM: 3.6 mmol/L (ref 3.5–5.1)
Sodium: 137 mmol/L (ref 135–145)

## 2016-02-11 MED ORDER — OXYCODONE-ACETAMINOPHEN 5-325 MG PO TABS: 1.0000 | ORAL_TABLET | Freq: Four times a day (QID) | ORAL | Status: DC | PRN

## 2016-02-11 MED ORDER — HYDROMORPHONE HCL 1 MG/ML IJ SOLN
INTRAMUSCULAR | Status: AC
  Filled 2016-02-11: qty 1

## 2016-02-11 MED ORDER — HYDROMORPHONE HCL 1 MG/ML IJ SOLN
1.0000 mg | Freq: Once | INTRAMUSCULAR | Status: AC
  Administered 2016-02-11: 1 mg via INTRAVENOUS

## 2016-02-11 NOTE — ED Notes (Signed)
Patient inquiring about more Dilaudid, Zackowski informed.

## 2016-02-11 NOTE — Discharge Instructions (Signed)
Continue warm compresses to the left knee. Start taking the doxycycline in the morning twice a day for 7 days. Take the Percocet as needed for pain. Recommend you return for recheck of the left knee infection on Tuesday.

## 2016-02-13 ENCOUNTER — Encounter (HOSPITAL_COMMUNITY): Payer: Self-pay | Admitting: Emergency Medicine

## 2016-02-13 ENCOUNTER — Emergency Department (HOSPITAL_COMMUNITY)
Admission: EM | Admit: 2016-02-13 | Discharge: 2016-02-14 | Disposition: A | Discharge status: Home / Self Care | Payer: No Typology Code available for payment source | Attending: Emergency Medicine | Admitting: Emergency Medicine

## 2016-02-13 DIAGNOSIS — L089 Local infection of the skin and subcutaneous tissue, unspecified: Secondary | ICD-10-CM

## 2016-02-13 DIAGNOSIS — Z79899 Other long term (current) drug therapy: Secondary | ICD-10-CM | POA: Insufficient documentation

## 2016-02-13 DIAGNOSIS — L02416 Cutaneous abscess of left lower limb: Secondary | ICD-10-CM | POA: Insufficient documentation

## 2016-02-13 DIAGNOSIS — Z79891 Long term (current) use of opiate analgesic: Secondary | ICD-10-CM | POA: Insufficient documentation

## 2016-02-13 DIAGNOSIS — F17210 Nicotine dependence, cigarettes, uncomplicated: Secondary | ICD-10-CM | POA: Insufficient documentation

## 2016-02-13 LAB — CBC WITH DIFFERENTIAL/PLATELET
Basophils Absolute: 0.1 10*3/uL (ref 0.0–0.1)
Basophils Relative: 1 %
EOS PCT: 7 %
Eosinophils Absolute: 0.7 10*3/uL (ref 0.0–0.7)
HEMATOCRIT: 38.5 % (ref 36.0–46.0)
Hemoglobin: 12.5 g/dL (ref 12.0–15.0)
LYMPHS ABS: 3 10*3/uL (ref 0.7–4.0)
LYMPHS PCT: 28 %
MCH: 31.5 pg (ref 26.0–34.0)
MCHC: 32.5 g/dL (ref 30.0–36.0)
MCV: 97 fL (ref 78.0–100.0)
MONO ABS: 0.9 10*3/uL (ref 0.1–1.0)
MONOS PCT: 9 %
NEUTROS ABS: 6 10*3/uL (ref 1.7–7.7)
Neutrophils Relative %: 55 %
PLATELETS: 337 10*3/uL (ref 150–400)
RBC: 3.97 MIL/uL (ref 3.87–5.11)
RDW: 13.1 % (ref 11.5–15.5)
WBC: 10.7 10*3/uL — ABNORMAL HIGH (ref 4.0–10.5)

## 2016-02-13 LAB — LACTIC ACID, PLASMA: Lactic Acid, Venous: 1 mmol/L (ref 0.5–1.9)

## 2016-02-13 LAB — BASIC METABOLIC PANEL
ANION GAP: 6 (ref 5–15)
BUN: 22 mg/dL — ABNORMAL HIGH (ref 6–20)
CALCIUM: 9.1 mg/dL (ref 8.9–10.3)
CO2: 30 mmol/L (ref 22–32)
CREATININE: 0.85 mg/dL (ref 0.44–1.00)
Chloride: 102 mmol/L (ref 101–111)
GFR calc Af Amer: >60 mL/min (ref >60)
GLUCOSE: 104 mg/dL — AB (ref 65–99)
Potassium: 3.9 mmol/L (ref 3.5–5.1)
Sodium: 138 mmol/L (ref 135–145)

## 2016-02-13 MED ORDER — OXYCODONE-ACETAMINOPHEN 5-325 MG PO TABS: 1.0000 | ORAL_TABLET | Freq: Four times a day (QID) | ORAL | Status: DC | PRN

## 2016-02-13 MED ORDER — LIDOCAINE HCL (PF) 2 % IJ SOLN
10.0000 mL | Freq: Once | INTRAMUSCULAR | Status: AC
  Administered 2016-02-13: 10 mL via INTRADERMAL
  Filled 2016-02-13: qty 10

## 2016-02-13 MED ORDER — OXYCODONE-ACETAMINOPHEN 5-325 MG PO TABS
2.0000 | ORAL_TABLET | Freq: Once | ORAL | Status: AC
  Administered 2016-02-13: 2 via ORAL
  Filled 2016-02-13: qty 2

## 2016-02-13 NOTE — ED Notes (Signed)
Pt c/o abscess to LT knee. States it began as a tick bite. Pt noted to have redness around wound with green drainage. Pt also reports fevers, diarrhea, and vomiting.

## 2016-02-13 NOTE — ED Provider Notes (Signed)
CSN: [id number]     Arrival date & time 02/13/16  1641 History   First MD Initiated Contact with Patient 02/13/16 2238     Chief Complaint  Patient presents with  . Wound Infection     (Consider location/radiation/quality/duration/timing/severity/associated sxs/prior Treatment) HPI   Sara [name] is a 40 y.o. female who presents to the Emergency Department complaining of continued pain and redness to her left knee.  She has been seen here on two other occasions for same. Symptoms began after a tick bite to same area several days ago.   She has been taking doxycycline and reports somewhat improvement of the redness, but has continued pain with walking.  She states the area began draining several hours before arrival.  She denies fever, chills and weakness of the extremity.  Past Medical History  Diagnosis Date  . Back pain   . Anxiety    Past Surgical History  Procedure Laterality Date  . Orthopedic surgery    . Cholecystectomy     No family history on file. Social History  Substance Use Topics  . Smoking status: Current Some Day Smoker -- 0.25 packs/day for 5 years    Types: Cigarettes    Last Attempt to Quit: 09/18/2013  . Smokeless tobacco: Never Used  . Alcohol Use: No     Comment: occasionally   OB History    Gravida Para Term Preterm AB TAB SAB Ectopic Multiple Living   3 2 2  1  1   2      Review of Systems  Constitutional: Negative for fever and chills.  Gastrointestinal: Negative for nausea and vomiting.  Musculoskeletal: Negative for joint swelling and arthralgias.  Skin: Positive for color change.       Redness, drainage skin of left knee  Hematological: Negative for adenopathy.  All other systems reviewed and are negative.     Allergies  Hydrocodone; Sulfa antibiotics; and Morphine and related  Home Medications   Prior to Admission medications   Medication Sig Start Date End Date Taking? Authorizing Provider  albuterol (VENTOLIN HFA) 108 (90  BASE) MCG/ACT inhaler Inhale 2 puffs into the lungs every 6 (six) hours as needed. For shortness of breath   Yes Historical Provider, MD  diclofenac (VOLTAREN) 75 MG EC tablet Take 1 tablet (75 mg total) by mouth 2 (two) times daily. 02/09/16  Yes Lily Kocher, PA-C  doxycycline (VIBRAMYCIN) 100 MG capsule Take 1 capsule (100 mg total) by mouth 2 (two) times daily. 02/09/16  Yes Lily Kocher, PA-C  oxyCODONE-acetaminophen (PERCOCET/ROXICET) 5-325 MG tablet Take 1-2 tablets by mouth every 6 (six) hours as needed for severe pain. 02/11/16  Yes Fredia Sorrow, MD  clindamycin (CLEOCIN) 300 MG capsule Take 1 capsule (300 mg total) by mouth 4 (four) times daily. Patient not taking: Reported on 02/10/2016 12/29/15   Rolland Porter, MD  traMADol (ULTRAM) 50 MG tablet 1 or 2 po q6h prn pain, take with food Patient not taking: Reported on 02/13/2016 02/09/16   Lily Kocher, PA-C   BP 117/80 mmHg  Pulse 83  Temp(Src) 99 F (37.2 C) (Oral)  Resp 16  Ht 5\' 4"  (1.626 m)  Wt 56.7 kg  BMI 21.45 kg/m2  SpO2 100%  LMP 02/08/2016 Physical Exam  Constitutional: She is oriented to person, place, and time. She appears well-developed and well-nourished. No distress.  HENT:  Head: Normocephalic and atraumatic.  Cardiovascular: Normal rate and regular rhythm.   No murmur heard. Pulmonary/Chest: Effort normal and breath sounds  normal. No respiratory distress.  Musculoskeletal: She exhibits no edema.  Neurological: She is alert and oriented to person, place, and time. She exhibits normal muscle tone. Coordination normal.  Skin: Skin is warm and dry. There is erythema.  Localized 4 cm area of erythema of the skin overlying left knee.  Appears superficial, small amt of thick, purulent drainage present.  No fluctuance.    Nursing note and vitals reviewed.   ED Course  Procedures (including critical care time) Labs Review Labs Reviewed  CBC WITH DIFFERENTIAL/PLATELET - Abnormal; Notable for the following:    WBC  10.7 (*)    All other components within normal limits  BASIC METABOLIC PANEL - Abnormal; Notable for the following:    Glucose, Bld 104 (*)    BUN 22 (*)    All other components within normal limits  LACTIC ACID, PLASMA    Imaging Review No results found. I have personally reviewed and evaluated these images and lab results as part of my medical decision-making.   EKG Interpretation None     INCISION AND DRAINAGE Performed by: Hale Bogus. Consent: Verbal consent obtained. Risks and benefits: risks, benefits and alternatives were discussed Type: abscess  Body area: skin of left knee  Anesthesia: local infiltration   Local anesthetic: lidocaine 2 % w/o epinephrine  Anesthetic total: 3 ml  Complexity: complex Blunt dissection to break up loculations  Drainage: thick, purulent  Area was irrigated with saline and purulent material was debrided.  Drainage amount: small  Packing material: none  Patient tolerance: Patient tolerated the procedure well with no immediate complications. '''''''''''''''''''''    MDM   Final diagnoses:  Infection of skin of knee    Small abscess to left knee, appears superficial.  Doubt joint involvement.  No lymphangitis.  Currently taking doxy, I will have her continue.  Leading edge of erythema marked and pt advised to return for any worsening sx's, warm soaks    Kem Parkinson, PA-C 02/16/16 1452  Fredia Sorrow, MD 02/18/16 2219

## 2017-02-23 ENCOUNTER — Encounter: Payer: Self-pay | Admitting: Adult Health

## 2017-02-23 ENCOUNTER — Other Ambulatory Visit (INDEPENDENT_AMBULATORY_CARE_PROVIDER_SITE_OTHER): Payer: Self-pay

## 2017-02-23 ENCOUNTER — Ambulatory Visit (INDEPENDENT_AMBULATORY_CARE_PROVIDER_SITE_OTHER): Payer: Self-pay | Admitting: Adult Health

## 2017-02-23 ENCOUNTER — Other Ambulatory Visit: Payer: Self-pay | Admitting: Adult Health

## 2017-02-23 VITALS — BP 104/60 | HR 84 | Ht 64.0 in | Wt 116.0 lb

## 2017-02-23 DIAGNOSIS — O200 Threatened abortion: Secondary | ICD-10-CM

## 2017-02-23 DIAGNOSIS — N83201 Unspecified ovarian cyst, right side: Secondary | ICD-10-CM | POA: Insufficient documentation

## 2017-02-23 DIAGNOSIS — O3680X0 Pregnancy with inconclusive fetal viability, not applicable or unspecified: Secondary | ICD-10-CM

## 2017-02-23 DIAGNOSIS — N83202 Unspecified ovarian cyst, left side: Secondary | ICD-10-CM

## 2017-02-23 DIAGNOSIS — R58 Hemorrhage, not elsewhere classified: Secondary | ICD-10-CM | POA: Insufficient documentation

## 2017-02-23 DIAGNOSIS — Z3201 Encounter for pregnancy test, result positive: Secondary | ICD-10-CM

## 2017-02-23 DIAGNOSIS — Z3490 Encounter for supervision of normal pregnancy, unspecified, unspecified trimester: Secondary | ICD-10-CM

## 2017-02-23 LAB — POCT URINE PREGNANCY: Preg Test, Ur: POSITIVE — AB

## 2017-02-23 NOTE — Progress Notes (Addendum)
K[place] 9+2 wks irregular GS 39.4 mm,NO fetal pole or YS visualized,bilat hemorrhagic ovarian cysts,left 2.7 x 2.4 x 2.2 cm,right 2.9 x 2.6 x 2.9 cm,small amount of simple bilat adnexal fluid,left adnexal pain during ultrasound.

## 2017-02-23 NOTE — Progress Notes (Signed)
Subjective:     Patient ID: Sara [name], female   DOB: 19-May-1976, 41 y.o.   MRN: [id number]  HPI Sara [name] is a 41 year old white female in for UPT, has missed several periods and had +HPT.Has nausea and vomiting and bleeding for 2 weeks, dark now.  Review of Systems Missed period, +HPT Nausea and vomiting  Bleeding x 2 weeks, dark now Reviewed past medical,surgical, social and family history. Reviewed medications and allergies.     Objective:   Physical Exam BP 104/60 (BP Location: Right Arm, Patient Position: Sitting, Cuff Size: Normal)   Pulse 84   Ht 5\' 4"  (1.626 m)   Wt 116 lb (52.6 kg)   LMP 11/07/2016 (Approximate)   BMI 19.91 kg/m UPT +,should be about 15+3 by LMP with EDD 08/14/17, but on POC Korea no IUP seen and sac irregular, will get Sara [name] to do [place] as add on at end of day.Skin warm and dry. Neck: mid line trachea, normal thyroid, good ROM, no lymphadenopathy noted. Lungs: clear to ausculation bilaterally. Cardiovascular: regular rate and rhythm.Abdomen is soft and mildly tender.  US showed: 3.9 cm GS, no fetal pole or YS has hemorrhagic cyst both ovaries and some right adnexal fluid. Left 2.7 x 2.4 x 2.2 cm and right is 2.9 x 2.6 x 2.9 cm.Will get F/U US in 1 week and will talk in am when labs back, and can't be reassuring at this time.    Face time 20 minutes with 50 % counseling and coordinating care.   Assessment:     1. Positive pregnancy test   2. Pregnancy, unspecified gestational age   40. Encounter to determine fetal viability of pregnancy, single or unspecified fetus   4. Bleeding   5. Threatened abortion   6. Bilateral ovarian cysts       Plan:     Check QHCG, progesterone and ABORH now Will get Korea today as add on  F/U US in 1 week

## 2017-02-24 ENCOUNTER — Telehealth: Payer: Self-pay | Admitting: Adult Health

## 2017-02-24 DIAGNOSIS — O200 Threatened abortion: Secondary | ICD-10-CM

## 2017-02-24 LAB — ABO/RH: Rh Factor: POSITIVE

## 2017-02-24 LAB — PROGESTERONE: PROGESTERONE: 4.4 ng/mL

## 2017-02-24 LAB — BETA HCG QUANT (REF LAB): hCG Quant: 1062 m[IU]/mL

## 2017-02-24 NOTE — Telephone Encounter (Signed)
Left message to call me about labs  

## 2017-02-24 NOTE — Telephone Encounter (Signed)
Pt aware of labs, will recheck [facility] tomorrow, and keep [place] appt next week

## 2017-02-25 ENCOUNTER — Other Ambulatory Visit: Payer: Medicaid Other

## 2017-03-02 ENCOUNTER — Other Ambulatory Visit: Payer: Self-pay | Admitting: Obstetrics & Gynecology

## 2017-03-02 DIAGNOSIS — O3680X0 Pregnancy with inconclusive fetal viability, not applicable or unspecified: Secondary | ICD-10-CM

## 2017-03-03 ENCOUNTER — Other Ambulatory Visit: Payer: Medicaid Other

## 2017-05-15 ENCOUNTER — Telehealth: Payer: Self-pay | Admitting: Adult Health

## 2017-05-15 NOTE — Telephone Encounter (Signed)
Pt complains of cramping and bleeding, has not follow up when had +QHCG in July and US showed no IUP, is in [place], told to go to ER, she declines, try 4 advil, can't call in pain meds like hydrocodone.

## 2017-05-25 ENCOUNTER — Ambulatory Visit: Payer: Self-pay | Admitting: Adult Health

## 2017-07-13 ENCOUNTER — Ambulatory Visit: Payer: Medicaid Other | Admitting: Adult Health

## 2017-08-20 ENCOUNTER — Ambulatory Visit: Payer: Medicaid Other | Admitting: Adult Health

## 2017-08-21 ENCOUNTER — Telehealth: Payer: Self-pay | Admitting: Adult Health

## 2017-08-21 ENCOUNTER — Ambulatory Visit: Payer: Medicaid Other | Admitting: Adult Health

## 2017-08-21 NOTE — Telephone Encounter (Signed)
Informed patient Sara [name] will see patient on 1/11 at 1pm as work in.

## 2017-09-01 ENCOUNTER — Ambulatory Visit: Payer: Medicaid Other | Admitting: Adult Health

## 2017-09-03 ENCOUNTER — Ambulatory Visit: Payer: Medicaid Other | Admitting: Adult Health

## 2017-09-09 ENCOUNTER — Ambulatory Visit: Payer: Medicaid Other | Admitting: Adult Health

## 2017-09-16 ENCOUNTER — Ambulatory Visit: Payer: Medicaid Other | Admitting: Advanced Practice Midwife

## 2017-09-16 ENCOUNTER — Ambulatory Visit: Payer: Medicaid Other | Admitting: Adult Health

## 2017-09-16 ENCOUNTER — Encounter (INDEPENDENT_AMBULATORY_CARE_PROVIDER_SITE_OTHER): Payer: Self-pay

## 2017-09-16 ENCOUNTER — Encounter: Payer: Self-pay | Admitting: Adult Health

## 2017-09-16 VITALS — BP 102/60 | HR 91 | Ht 64.0 in | Wt 128.0 lb

## 2017-09-16 DIAGNOSIS — Z8759 Personal history of other complications of pregnancy, childbirth and the puerperium: Secondary | ICD-10-CM

## 2017-09-16 NOTE — Progress Notes (Signed)
Subjective:     Patient ID: Sara [name], female   DOB: [birth year], 42 y.o.   MRN: [id number]  HPI Sara [name] is a 42 year old white female in to discuss miscarriage.She was seen 02/23/17 and had QHCG 1,062 and Korea that showed no IUP but 9+2 week GS, and progesterone 4.4 and she had no follow up.She said she passed tissue in September, and has had regular periods.She needs to get medicaid fot that time and needs EDD.  Review of Systems  Having periods  Reviewed past medical,surgical, social and family history. Reviewed medications and allergies.     Objective:   Physical Exam BP 102/60 (BP Location: Right Arm, Patient Position: Sitting, Cuff Size: Normal)   Pulse 91   Ht 5\' 4"  (1.626 m)   Wt 128 lb (58.1 kg)   LMP 09/12/2017   Breastfeeding? No   BMI 21.97 kg/m  PHQ 2 score 0. Talk only, estimated EDD to be 09/27/17 and note given for DSS. She  Wants one more child.     Assessment:     1. History of miscarriage       Plan:     Return in 1 week for pap and physical and discuss getting pregnant.

## 2017-09-23 ENCOUNTER — Other Ambulatory Visit: Payer: Medicaid Other | Admitting: Adult Health

## 2017-09-24 ENCOUNTER — Telehealth: Payer: Self-pay | Admitting: Adult Health

## 2017-09-25 NOTE — Telephone Encounter (Signed)
left message that first appt 2/21 at 1:30, to call if wants that before it is filled

## 2017-10-08 ENCOUNTER — Other Ambulatory Visit: Payer: Medicaid Other | Admitting: Adult Health

## 2017-10-08 ENCOUNTER — Encounter: Payer: Self-pay | Admitting: Adult Health

## 2019-06-21 ENCOUNTER — Emergency Department (HOSPITAL_COMMUNITY)
Admission: EM | Admit: 2019-06-21 | Discharge: 2019-06-21 | Disposition: A | Discharge status: Home / Self Care | Attending: Emergency Medicine | Admitting: Emergency Medicine

## 2019-06-21 ENCOUNTER — Encounter (HOSPITAL_COMMUNITY): Payer: Self-pay | Admitting: Emergency Medicine

## 2019-06-21 ENCOUNTER — Other Ambulatory Visit: Payer: Self-pay

## 2019-06-21 DIAGNOSIS — B349 Viral infection, unspecified: Secondary | ICD-10-CM | POA: Insufficient documentation

## 2019-06-21 DIAGNOSIS — Z20828 Contact with and (suspected) exposure to other viral communicable diseases: Secondary | ICD-10-CM | POA: Insufficient documentation

## 2019-06-21 DIAGNOSIS — F17210 Nicotine dependence, cigarettes, uncomplicated: Secondary | ICD-10-CM | POA: Insufficient documentation

## 2019-06-21 MED ORDER — IBUPROFEN 800 MG PO TABS: 800.0000 mg | ORAL_TABLET | Freq: Three times a day (TID) | ORAL | 0 refills | Status: DC

## 2019-06-21 NOTE — ED Triage Notes (Addendum)
Patient complaining of fever, body aches, fatigue, and chills since Thursday and needs work note. States she had vomiting Thursday and Saturday but has resolved.

## 2019-06-21 NOTE — Discharge Instructions (Signed)
You will need to self isolate at home at least until your test results are back if negative.  If your results are positive you will need to self isolate for 14 days or until you are at least 7 days without symptoms including fever.  You may review your results on MyChart.  Return to the ER for any worsening symptoms such as increasing shortness of breath

## 2019-06-22 LAB — NOVEL CORONAVIRUS, NAA (HOSP ORDER, SEND-OUT TO REF LAB; TAT 18-24 HRS): SARS-CoV-2, NAA: NOT DETECTED

## 2019-06-22 NOTE — ED Provider Notes (Signed)
[facility] EMERGENCY DEPARTMENT Provider Note   CSN: [id number] Arrival date & time: 06/21/19  1629     History   Chief Complaint Chief Complaint  Patient presents with  . Fever    HPI Sara [name] is a 43 y.o. female.     HPI   Sara [name] is a 43 y.o. female who presents to the Emergency Department requesting a work note.  She reports having fever, chills, generalized body aches and fatigue.  Symptoms have been present for 5 days.  She also reports intermittent vomiting at onset of her symptoms, but has not had continued vomiting for 3 days.  She is tolerating liquids and solid foods.  Denies known COVID exposures, shortness of breath or significant cough.  Fever has been low grade.  She denies abdominal pain, dysuria, chest pain, diarrhea and sore throat.    Past Medical History:  Diagnosis Date  . Anxiety   . Back pain     Patient Active Problem List   Diagnosis Date Noted  . Bilateral ovarian cysts 02/23/2017  . Threatened abortion 02/23/2017  . Bleeding 02/23/2017  . Pregnancy 02/23/2017  . Positive pregnancy test 02/23/2017    Past Surgical History:  Procedure Laterality Date  . CHOLECYSTECTOMY    . ORTHOPEDIC SURGERY       OB History    Gravida  4   Para  2   Term  2   Preterm      AB  2   Living  2     SAB  1   TAB      Ectopic      Multiple      Live Births               Home Medications    Prior to Admission medications   Medication Sig Start Date End Date Taking? Authorizing Provider  albuterol (VENTOLIN HFA) 108 (90 BASE) MCG/ACT inhaler Inhale 2 puffs into the lungs every 6 (six) hours as needed. For shortness of breath    [provider]  cloNIDine (CATAPRES) 0.1 MG tablet Take 0.1 mg by mouth 3 (three) times daily.    [provider]  ibuprofen (ADVIL) 800 MG tablet Take 1 tablet (800 mg total) by mouth 3 (three) times daily. 06/21/19   Kem Parkinson, PA-C    Family History Family History   Problem Relation Age of Onset  . Cancer Paternal Grandfather   . Cancer Paternal Grandmother        lung  . Heart disease Brother   . Asthma Daughter   . Asthma Daughter     Social History Social History   Tobacco Use  . Smoking status: Current Some Day Smoker    Packs/day: 1.00    Years: 5.00    Pack years: 5.00    Types: Cigarettes    Last attempt to quit: 09/18/2013    Years since quitting: 5.7  . Smokeless tobacco: Never Used  Substance Use Topics  . Alcohol use: No    Comment: occasionally  . Drug use: No     Allergies   Hydrocodone, Sulfa antibiotics, and Morphine and related   Review of Systems Review of Systems  Constitutional: Positive for appetite change, chills, fatigue and fever. Negative for activity change.  HENT: Positive for congestion and rhinorrhea. Negative for facial swelling, sore throat, tinnitus and trouble swallowing.   Eyes: Negative for visual disturbance.  Respiratory: Negative for cough, shortness of breath, wheezing and  stridor.   Cardiovascular: Negative for chest pain.  Gastrointestinal: Positive for nausea and vomiting. Negative for abdominal pain.  Genitourinary: Negative for decreased urine volume, dysuria and flank pain.  Musculoskeletal: Positive for myalgias. Negative for neck pain and neck stiffness.  Skin: Negative for rash.  Neurological: Negative for dizziness, weakness, numbness and headaches.  Hematological: Negative for adenopathy.  Psychiatric/Behavioral: Negative for confusion.     Physical Exam Updated Vital Signs BP 139/89 (BP Location: Right Arm)   Pulse 74   Temp 98.1 F (36.7 C) (Oral)   Resp 20   Ht 5\' 4"  (1.626 m)   Wt 63.5 kg   LMP 06/11/2019   SpO2 96%   BMI 24.03 kg/m   Physical Exam Vitals signs and nursing note reviewed.  Constitutional:      General: She is not in acute distress.    Appearance: Normal appearance. She is well-developed. She is not toxic-appearing.  HENT:     Head:  Normocephalic.     Jaw: No trismus.     Right Ear: Tympanic membrane and ear canal normal.     Left Ear: Tympanic membrane and ear canal normal.     Nose: Mucosal edema present. No rhinorrhea.     Mouth/Throat:     Mouth: Mucous membranes are moist.     Pharynx: Uvula midline. No oropharyngeal exudate, posterior oropharyngeal erythema or uvula swelling.     Tonsils: No tonsillar abscesses.  Neck:     Musculoskeletal: Normal range of motion and neck supple. No muscular tenderness.     Trachea: Phonation normal.     Meningeal: Brudzinski's sign and Kernig's sign absent.  Cardiovascular:     Rate and Rhythm: Normal rate and regular rhythm.  Pulmonary:     Effort: Pulmonary effort is normal. No respiratory distress.     Breath sounds: Normal breath sounds. No wheezing or rales.  Abdominal:     General: There is no distension.     Palpations: Abdomen is soft.     Tenderness: There is no abdominal tenderness. There is no right CVA tenderness, left CVA tenderness, guarding or rebound.  Musculoskeletal: Normal range of motion.  Lymphadenopathy:     Cervical: No cervical adenopathy.  Skin:    General: Skin is warm.     Findings: No rash.  Neurological:     General: No focal deficit present.     Mental Status: She is alert.     Motor: No weakness or abnormal muscle tone.      ED Treatments / Results  Labs (all labs ordered are listed, but only abnormal results are displayed) Labs Reviewed  NOVEL CORONAVIRUS, NAA (HOSP ORDER, SEND-OUT TO REF LAB; TAT 18-24 HRS)    EKG None  Radiology No results found.  Procedures Procedures (including critical care time)  Medications Ordered in ED Medications - No data to display   Initial Impression / Assessment and Plan / ED Course  I have reviewed the triage vital signs and the nursing notes.  Pertinent labs & imaging results that were available during my care of the patient were reviewed by me and considered in my medical decision  making (see chart for details).        Pt with improving sx's for 3 days. She is here requesting a work excuse.  No concerning sx's for acute abdominal process.  No respiratory distress.  Pt agreed to COVID testing, results pending.  She agrees to isolation at home at least until results are back.  Return precautions discussed  Final Clinical Impressions(s) / ED Diagnoses   Final diagnoses:  Viral illness    ED Discharge Orders         Ordered    ibuprofen (ADVIL) 800 MG tablet  3 times daily     06/21/19 1732           Kem Parkinson, PA-C 06/22/19 1316    Noemi Chapel, MD 06/22/19 1458

## 2019-10-25 ENCOUNTER — Emergency Department (HOSPITAL_COMMUNITY)
Admission: EM | Admit: 2019-10-25 | Discharge: 2019-10-25 | Disposition: A | Discharge status: Home / Self Care | Attending: Emergency Medicine | Admitting: Emergency Medicine

## 2019-10-25 ENCOUNTER — Other Ambulatory Visit: Payer: Self-pay

## 2019-10-25 ENCOUNTER — Encounter (HOSPITAL_COMMUNITY): Payer: Self-pay | Admitting: Emergency Medicine

## 2019-10-25 DIAGNOSIS — R112 Nausea with vomiting, unspecified: Secondary | ICD-10-CM | POA: Insufficient documentation

## 2019-10-25 DIAGNOSIS — Z20822 Contact with and (suspected) exposure to covid-19: Secondary | ICD-10-CM | POA: Insufficient documentation

## 2019-10-25 DIAGNOSIS — F17210 Nicotine dependence, cigarettes, uncomplicated: Secondary | ICD-10-CM | POA: Insufficient documentation

## 2019-10-25 LAB — CBC WITH DIFFERENTIAL/PLATELET
Abs Immature Granulocytes: 0.04 10*3/uL (ref 0.00–0.07)
Basophils Absolute: 0.1 10*3/uL (ref 0.0–0.1)
Basophils Relative: 1 %
Eosinophils Absolute: 0.1 10*3/uL (ref 0.0–0.5)
Eosinophils Relative: 1 %
HCT: 43.5 % (ref 36.0–46.0)
Hemoglobin: 14.5 g/dL (ref 12.0–15.0)
Immature Granulocytes: 0 %
Lymphocytes Relative: 18 %
Lymphs Abs: 1.8 10*3/uL (ref 0.7–4.0)
MCH: 32.8 pg (ref 26.0–34.0)
MCHC: 33.3 g/dL (ref 30.0–36.0)
MCV: 98.4 fL (ref 80.0–100.0)
Monocytes Absolute: 0.8 10*3/uL (ref 0.1–1.0)
Monocytes Relative: 8 %
Neutro Abs: 7.4 10*3/uL (ref 1.7–7.7)
Neutrophils Relative %: 72 %
Platelets: 278 10*3/uL (ref 150–400)
RBC: 4.42 MIL/uL (ref 3.87–5.11)
RDW: 12.9 % (ref 11.5–15.5)
WBC: 10.2 10*3/uL (ref 4.0–10.5)
nRBC: 0 % (ref 0.0–0.2)

## 2019-10-25 LAB — RESPIRATORY PANEL BY RT PCR (FLU A&B, COVID)
Influenza A by PCR: NEGATIVE
Influenza B by PCR: NEGATIVE
SARS Coronavirus 2 by RT PCR: NEGATIVE

## 2019-10-25 LAB — COMPREHENSIVE METABOLIC PANEL
ALT: 47 U/L — ABNORMAL HIGH (ref 0–44)
AST: 20 U/L (ref 15–41)
Albumin: 4 g/dL (ref 3.5–5.0)
Alkaline Phosphatase: 73 U/L (ref 38–126)
Anion gap: 8 (ref 5–15)
BUN: 15 mg/dL (ref 6–20)
CO2: 25 mmol/L (ref 22–32)
Calcium: 9.3 mg/dL (ref 8.9–10.3)
Chloride: 103 mmol/L (ref 98–111)
Creatinine, Ser: 0.84 mg/dL (ref 0.44–1.00)
GFR calc Af Amer: >60 mL/min (ref >60)
GFR calc non Af Amer: >60 mL/min (ref >60)
Glucose, Bld: 94 mg/dL (ref 70–99)
Potassium: 3.9 mmol/L (ref 3.5–5.1)
Sodium: 136 mmol/L (ref 135–145)
Total Bilirubin: 0.6 mg/dL (ref 0.3–1.2)
Total Protein: 7.5 g/dL (ref 6.5–8.1)

## 2019-10-25 LAB — URINALYSIS, ROUTINE W REFLEX MICROSCOPIC
Bilirubin Urine: NEGATIVE
Glucose, UA: NEGATIVE mg/dL
Hgb urine dipstick: NEGATIVE
Ketones, ur: 5 mg/dL — AB
Leukocytes,Ua: NEGATIVE
Nitrite: NEGATIVE
Protein, ur: NEGATIVE mg/dL
Specific Gravity, Urine: 1.023 (ref 1.005–1.030)
pH: 5 (ref 5.0–8.0)

## 2019-10-25 LAB — PREGNANCY, URINE: Preg Test, Ur: NEGATIVE

## 2019-10-25 LAB — LIPASE, BLOOD: Lipase: 20 U/L (ref 11–51)

## 2019-10-25 MED ORDER — KETOROLAC TROMETHAMINE 30 MG/ML IJ SOLN
30.0000 mg | Freq: Once | INTRAMUSCULAR | Status: AC
  Administered 2019-10-25: 12:00:00 30 mg via INTRAVENOUS
  Filled 2019-10-25: qty 1

## 2019-10-25 MED ORDER — ONDANSETRON HCL 4 MG PO TABS: 4.0000 mg | ORAL_TABLET | Freq: Four times a day (QID) | ORAL | 0 refills | Status: DC

## 2019-10-25 MED ORDER — NAPROXEN 500 MG PO TABS: 500.0000 mg | ORAL_TABLET | Freq: Two times a day (BID) | ORAL | 0 refills | Status: DC

## 2019-10-25 MED ORDER — ONDANSETRON HCL 4 MG/2ML IJ SOLN
4.0000 mg | Freq: Once | INTRAMUSCULAR | Status: AC
  Administered 2019-10-25: 12:00:00 4 mg via INTRAVENOUS
  Filled 2019-10-25: qty 2

## 2019-10-25 NOTE — ED Provider Notes (Signed)
[facility] EMERGENCY DEPARTMENT Provider Note   CSN: [id number] Arrival date & time: 10/25/19  1106     History Chief Complaint  Patient presents with  . Emesis    Sara [name] is a 44 y.o. female.  Patient is a 44 year old female past medical history of anxiety and chronic back pain from previous injury presenting to the emergency department for back pain, nausea, vomiting and generally feeling unwell.  Reports symptoms started on Saturday.  Reports that this started after being around someone else who had a viral type illness.  She reports vomiting and decreased appetite.  Reports occasional diarrhea.  Reports feeling feverish but no recorded temperature.  Reports severe lower back pain and dark-colored urine.  Has not tried anything for relief.        Past Medical History:  Diagnosis Date  . Anxiety   . Back pain     Patient Active Problem List   Diagnosis Date Noted  . Bilateral ovarian cysts 02/23/2017  . Threatened abortion 02/23/2017  . Bleeding 02/23/2017  . Pregnancy 02/23/2017  . Positive pregnancy test 02/23/2017    Past Surgical History:  Procedure Laterality Date  . CHOLECYSTECTOMY    . ORTHOPEDIC SURGERY       OB History    Gravida  4   Para  2   Term  2   Preterm      AB  2   Living  2     SAB  1   TAB      Ectopic      Multiple      Live Births              Family History  Problem Relation Age of Onset  . Cancer Paternal Grandfather   . Cancer Paternal Grandmother        lung  . Heart disease Brother   . Asthma Daughter   . Asthma Daughter     Social History   Tobacco Use  . Smoking status: Current Some Day Smoker    Packs/day: 1.00    Years: 5.00    Pack years: 5.00    Types: Cigarettes    Last attempt to quit: 09/18/2013    Years since quitting: 6.1  . Smokeless tobacco: Never Used  Substance Use Topics  . Alcohol use: No    Comment: occasionally  . Drug use: No    Home Medications Prior to  Admission medications   Medication Sig Start Date End Date Taking? Authorizing Provider  albuterol (VENTOLIN HFA) 108 (90 BASE) MCG/ACT inhaler Inhale 2 puffs into the lungs every 6 (six) hours as needed. For shortness of breath    [provider]  cloNIDine (CATAPRES) 0.1 MG tablet Take 0.1 mg by mouth 3 (three) times daily.    [provider]  ibuprofen (ADVIL) 800 MG tablet Take 1 tablet (800 mg total) by mouth 3 (three) times daily. 06/21/19   Triplett, Tammy, PA-C  naproxen (NAPROSYN) 500 MG tablet Take 1 tablet (500 mg total) by mouth 2 (two) times daily. 10/25/19   Madilyn Hook A, PA-C  ondansetron (ZOFRAN) 4 MG tablet Take 1 tablet (4 mg total) by mouth every 6 (six) hours. 10/25/19   Alveria Apley, PA-C  traZODone (DESYREL) 100 MG tablet Take 100 mg by mouth daily. 09/27/19   [provider]    Allergies    Hydrocodone, Sulfa antibiotics, and Morphine and related  Review of Systems   Review of Systems  Constitutional: Positive for appetite change, fatigue and fever. Negative for chills.  HENT: Negative for congestion and sore throat.   Eyes: Negative for visual disturbance.  Respiratory: Negative for cough and shortness of breath.   Cardiovascular: Negative for chest pain.  Gastrointestinal: Positive for abdominal pain, diarrhea, nausea and vomiting. Negative for constipation.  Genitourinary: Positive for decreased urine volume. Negative for difficulty urinating, dysuria, hematuria, pelvic pain, urgency, vaginal bleeding and vaginal discharge.  Musculoskeletal: Positive for back pain.  Neurological: Negative for dizziness and light-headedness.  All other systems reviewed and are negative.   Physical Exam Updated Vital Signs BP (!) 137/99   Pulse 86   Temp 98.2 F (36.8 C) (Oral)   Resp 17   Ht 5\' 4"  (1.626 m)   Wt 59.9 kg   LMP 09/21/2019   SpO2 100%   BMI 22.66 kg/m   Physical Exam Vitals and nursing note reviewed.  Constitutional:       General: She is not in acute distress.    Appearance: Normal appearance. She is not ill-appearing, toxic-appearing or diaphoretic.  HENT:     Head: Normocephalic.     Mouth/Throat:     Mouth: Mucous membranes are moist.     Pharynx: Oropharynx is clear.  Eyes:     Conjunctiva/sclera: Conjunctivae normal.  Cardiovascular:     Rate and Rhythm: Normal rate and regular rhythm.     Pulses: Normal pulses.     Heart sounds: Normal heart sounds.  Pulmonary:     Effort: Pulmonary effort is normal.     Breath sounds: Normal breath sounds.  Abdominal:     Tenderness: There is no abdominal tenderness. There is no right CVA tenderness, left CVA tenderness or guarding.  Skin:    General: Skin is warm and dry.  Neurological:     Mental Status: She is alert.  Psychiatric:        Mood and Affect: Mood normal.     ED Results / Procedures / Treatments   Labs (all labs ordered are listed, but only abnormal results are displayed) Labs Reviewed  URINALYSIS, ROUTINE W REFLEX MICROSCOPIC - Abnormal; Notable for the following components:      Result Value   APPearance HAZY (*)    Ketones, ur 5 (*)    All other components within normal limits  COMPREHENSIVE METABOLIC PANEL - Abnormal; Notable for the following components:   ALT 47 (*)    All other components within normal limits  RESPIRATORY PANEL BY RT PCR (FLU A&B, COVID)  PREGNANCY, URINE  CBC WITH DIFFERENTIAL/PLATELET  LIPASE, BLOOD    EKG None  Radiology No results found.  Procedures Procedures (including critical care time)  Medications Ordered in ED Medications  ketorolac (TORADOL) 30 MG/ML injection 30 mg (30 mg Intravenous Given 10/25/19 1227)  ondansetron (ZOFRAN) injection 4 mg (4 mg Intravenous Given 10/25/19 1227)    ED Course  I have reviewed the triage vital signs and the nursing notes.  Pertinent labs & imaging results that were available during my care of the patient were reviewed by me and considered in my medical  decision making (see chart for details).  Clinical Course as of Oct 24 1398  Tue Oct 25, 2019  1355 Patient with nausea, body aches, back pain and subjective fevers for 3 to 4 days.  Appears well on exam, afebrile.  Tolerating p.o. and lab work is reassuring.  Suspect viral etiology.  Patient was swabbed for flu, Covid and is pending.  Patient okay with discharge home and follow-up with primary care doctor.  Advised on return precautions.   [KM]    Clinical Course User Index [KM] Kristine Royal   MDM Rules/Calculators/A&P                      Based on review of vitals, medical screening exam, lab work and/or imaging, there does not appear to be an acute, emergent etiology for the patient's symptoms. Counseled pt on good return precautions and encouraged both PCP and ED follow-up as needed.  Prior to discharge, I also discussed incidental imaging findings with patient in detail and advised appropriate, recommended follow-up in detail.  Clinical Impression: 1. Non-intractable vomiting with nausea, unspecified vomiting type     Disposition: Discharge  Prior to providing a prescription for a controlled substance, I independently reviewed the patient's recent prescription history on the [facility]. The patient had no recent or regular prescriptions and was deemed appropriate for a brief, less than 3 day prescription of narcotic for acute analgesia.  This note was prepared with assistance of Systems analyst. Occasional wrong-word or sound-a-like substitutions may have occurred due to the inherent limitations of voice recognition software.  Final Clinical Impression(s) / ED Diagnoses Final diagnoses:  Non-intractable vomiting with nausea, unspecified vomiting type    Rx / DC Orders ED Discharge Orders         Ordered    naproxen (NAPROSYN) 500 MG tablet  2 times daily     10/25/19 1359    ondansetron (ZOFRAN) 4 MG tablet   Every 6 hours     10/25/19 1359           Kristine Royal 10/25/19 1400    Milton Ferguson, MD 10/28/19 1512

## 2019-10-25 NOTE — ED Triage Notes (Signed)
Pt c/o of lower back pain, fever, nausea, dark urine x 4 days.

## 2019-10-27 ENCOUNTER — Telehealth (HOSPITAL_COMMUNITY): Payer: Self-pay | Admitting: Emergency Medicine

## 2019-10-27 NOTE — Telephone Encounter (Deleted)
Patient called stating her Rx did not get transmitted from recent ED visit. Will print them for pickup

## 2019-10-27 NOTE — Telephone Encounter (Signed)
Patient called because Rx from recent visit not available at pharmacy, but while I was printing them the pharmacy was able to find them. No additional needs.

## 2019-12-14 ENCOUNTER — Other Ambulatory Visit: Payer: Self-pay

## 2019-12-14 ENCOUNTER — Encounter (HOSPITAL_COMMUNITY): Payer: Self-pay | Admitting: Emergency Medicine

## 2019-12-14 ENCOUNTER — Emergency Department (HOSPITAL_COMMUNITY): Payer: Self-pay

## 2019-12-14 ENCOUNTER — Emergency Department (HOSPITAL_COMMUNITY)
Admission: EM | Admit: 2019-12-14 | Discharge: 2019-12-14 | Disposition: A | Discharge status: Home / Self Care | Payer: Self-pay | Attending: Emergency Medicine | Admitting: Emergency Medicine

## 2019-12-14 DIAGNOSIS — S39012A Strain of muscle, fascia and tendon of lower back, initial encounter: Secondary | ICD-10-CM | POA: Insufficient documentation

## 2019-12-14 DIAGNOSIS — W1839XA Other fall on same level, initial encounter: Secondary | ICD-10-CM | POA: Insufficient documentation

## 2019-12-14 DIAGNOSIS — Y999 Unspecified external cause status: Secondary | ICD-10-CM | POA: Insufficient documentation

## 2019-12-14 DIAGNOSIS — S63501A Unspecified sprain of right wrist, initial encounter: Secondary | ICD-10-CM | POA: Insufficient documentation

## 2019-12-14 DIAGNOSIS — Z79899 Other long term (current) drug therapy: Secondary | ICD-10-CM | POA: Insufficient documentation

## 2019-12-14 DIAGNOSIS — Y939 Activity, unspecified: Secondary | ICD-10-CM | POA: Insufficient documentation

## 2019-12-14 DIAGNOSIS — F17210 Nicotine dependence, cigarettes, uncomplicated: Secondary | ICD-10-CM | POA: Insufficient documentation

## 2019-12-14 DIAGNOSIS — X501XXA Overexertion from prolonged static or awkward postures, initial encounter: Secondary | ICD-10-CM | POA: Insufficient documentation

## 2019-12-14 DIAGNOSIS — Y929 Unspecified place or not applicable: Secondary | ICD-10-CM | POA: Insufficient documentation

## 2019-12-14 DIAGNOSIS — S93401A Sprain of unspecified ligament of right ankle, initial encounter: Secondary | ICD-10-CM | POA: Insufficient documentation

## 2019-12-14 LAB — POC URINE PREG, ED: Preg Test, Ur: NEGATIVE

## 2019-12-14 MED ORDER — IBUPROFEN 400 MG PO TABS
600.0000 mg | ORAL_TABLET | Freq: Once | ORAL | Status: AC
  Administered 2019-12-14: 600 mg via ORAL
  Filled 2019-12-14: qty 2

## 2019-12-14 MED ORDER — OXYCODONE-ACETAMINOPHEN 5-325 MG PO TABS
1.0000 | ORAL_TABLET | Freq: Once | ORAL | Status: AC
  Administered 2019-12-14: 18:00:00 1 via ORAL
  Filled 2019-12-14: qty 1

## 2019-12-14 MED ORDER — MELOXICAM 7.5 MG PO TABS: 7.5000 mg | ORAL_TABLET | Freq: Every day | ORAL | 0 refills | Status: DC

## 2019-12-14 NOTE — ED Notes (Signed)
Pt ambulated to desk with normal gait, asking to go outside and smoke. Pt informed that if she did that she would have to sign out AMA. Pt went back to room at this time.

## 2019-12-14 NOTE — ED Triage Notes (Addendum)
Pain after twisting her RT ankle earlier, heard something pop.  Also reports lower back pain after fall.  Pt did have a fever but denies any s/s, states some people in her household have had a stomach virus recently,

## 2019-12-14 NOTE — Discharge Instructions (Signed)
Your x-rays this evening are negative for fracture or dislocation.  You likely have sprains.  You may apply ice packs on and off and elevate your ankle when possible.  Wear the brace as needed for weightbearing.  You may contact Dr. Ruthe Mannan office to arrange follow-up appointment next week if not improving.

## 2019-12-15 ENCOUNTER — Emergency Department (HOSPITAL_COMMUNITY)
Admission: EM | Admit: 2019-12-15 | Discharge: 2019-12-15 | Disposition: A | Discharge status: Home / Self Care | Payer: Medicaid Other

## 2019-12-16 NOTE — ED Provider Notes (Signed)
[facility] Provider Note   CSN: [id number] Arrival date & time: 12/14/19  1505     History Chief Complaint  Patient presents with  . Ankle Pain    Sara [name] is a 44 y.o. female.  HPI      Sara [name] is a 44 y.o. female who presents to the Emergency Department complaining of right ankle pain, right wrist pain and low back pain.  She describes a mechanical fall that occurred shortly before ER arrival.  She states that she slipped on a wet surface and fell twisting her ankle.  She describes hearing a "pop" in her ankle and a sharp stabbing sensation across her lower back.  She states pain is worse with movement.  She has been unable to bear weight to left ankle since the fall.  She denies abdominal pain, nausea vomiting, numbness or weakness of the lower extremities, urine or bowel changes, head injury or LOC.  She states that she has had a low-grade fever but denies symptoms.  Other family members have been sick recently with nausea and vomiting.  She denies any Covid exposures.   Past Medical History:  Diagnosis Date  . Anxiety   . Back pain     Patient Active Problem List   Diagnosis Date Noted  . Bilateral ovarian cysts 02/23/2017  . Threatened abortion 02/23/2017  . Bleeding 02/23/2017  . Pregnancy 02/23/2017  . Positive pregnancy test 02/23/2017    Past Surgical History:  Procedure Laterality Date  . CHOLECYSTECTOMY    . ORTHOPEDIC SURGERY       OB History    Gravida  4   Para  2   Term  2   Preterm      AB  2   Living  2     SAB  1   TAB      Ectopic      Multiple      Live Births              Family History  Problem Relation Age of Onset  . Cancer Paternal Grandfather   . Cancer Paternal Grandmother        lung  . Heart disease Brother   . Asthma Daughter   . Asthma Daughter     Social History   Tobacco Use  . Smoking status: Current Some Day Smoker    Packs/day: 1.00    Years: 5.00    Pack  years: 5.00    Types: Cigarettes    Last attempt to quit: 09/18/2013    Years since quitting: 6.2  . Smokeless tobacco: Never Used  Substance Use Topics  . Alcohol use: No    Comment: occasionally  . Drug use: No    Home Medications Prior to Admission medications   Medication Sig Start Date End Date Taking? Authorizing Provider  cloNIDine (CATAPRES) 0.1 MG tablet Take 0.1 mg by mouth daily as needed (for high blood pressure).    Yes [provider]  hydrOXYzine (ATARAX/VISTARIL) 25 MG tablet Take 25 mg by mouth 3 (three) times daily as needed for anxiety.  12/09/19  Yes [provider]  mirtazapine (REMERON) 15 MG tablet Take 15 mg by mouth at bedtime. 12/09/19  Yes [provider]  traZODone (DESYREL) 100 MG tablet Take 100 mg by mouth at bedtime as needed for sleep.  09/27/19  Yes [provider]  albuterol (VENTOLIN HFA) 108 (90 BASE) MCG/ACT inhaler Inhale 2 puffs into  the lungs every 6 (six) hours as needed. For shortness of breath    [provider]  meloxicam (MOBIC) 7.5 MG tablet Take 1 tablet (7.5 mg total) by mouth daily. 12/14/19   Romir Klimowicz, PA-C  naproxen (NAPROSYN) 500 MG tablet Take 1 tablet (500 mg total) by mouth 2 (two) times daily. Patient not taking: Reported on 12/14/2019 10/25/19   Madilyn Hook A, PA-C  ondansetron (ZOFRAN) 4 MG tablet Take 1 tablet (4 mg total) by mouth every 6 (six) hours. Patient not taking: Reported on 12/14/2019 10/25/19   Madilyn Hook A, PA-C    Allergies    Hydrocodone, Sulfa antibiotics, and Morphine and related  Review of Systems   Review of Systems  Constitutional: Negative for chills and fever.  Respiratory: Negative for shortness of breath.   Cardiovascular: Negative for chest pain.  Gastrointestinal: Negative for abdominal pain, diarrhea, nausea and vomiting.  Genitourinary: Negative for difficulty urinating and dysuria.  Musculoskeletal: Positive for arthralgias (right ankle, wrist pain)  and back pain. Negative for joint swelling.  Skin: Negative for color change and wound.  Neurological: Negative for dizziness, weakness, numbness and headaches.    Physical Exam Updated Vital Signs BP 119/73   Pulse 96   Temp (!) 100.7 F (38.2 C) (Oral)   Resp 16   Ht 5\' 4"  (1.626 m)   Wt 59 kg   LMP 11/18/2019   SpO2 97%   BMI 22.31 kg/m   Physical Exam Vitals and nursing note reviewed.  Constitutional:      General: She is not in acute distress.    Appearance: Normal appearance. She is well-developed.  HENT:     Head: Atraumatic.  Neck:     Comments: ttp of the right cervical paraspinal muscles.  No bony tenderness.  Cardiovascular:     Rate and Rhythm: Normal rate and regular rhythm.     Pulses: Normal pulses.  Pulmonary:     Effort: Pulmonary effort is normal.     Breath sounds: Normal breath sounds.  Chest:     Chest wall: No tenderness.  Abdominal:     Palpations: Abdomen is soft.     Tenderness: There is no abdominal tenderness.  Musculoskeletal:        General: Tenderness and signs of injury present. No swelling.     Cervical back: Tenderness present.     Comments: Diffuse ttp of the right ankle, no edema or bony deformity.  Mild tenderness of the distal right wrist.  No edema.  Has full ROM of the fingers of the right hand.  ttp of the lower lumbar spine and right lumbar paraspinal muscles.    Skin:    General: Skin is warm.     Capillary Refill: Capillary refill takes less than 2 seconds.     Findings: No erythema.  Neurological:     General: No focal deficit present.     Mental Status: She is alert.     Sensory: No sensory deficit.     Motor: No weakness or abnormal muscle tone.     Coordination: Coordination normal.     ED Results / Procedures / Treatments   Labs (all labs ordered are listed, but only abnormal results are displayed) Labs Reviewed  POC URINE PREG, ED    EKG None  Radiology DG Cervical Spine Complete  Result Date:  12/14/2019 CLINICAL DATA:  Fall EXAM: CERVICAL SPINE - COMPLETE 4+ VIEW COMPARISON:  01/17/2008 FINDINGS: Alignment is within normal limits. Mild degenerative  changes C5-C6. Normal prevertebral soft tissue thickness. Dens and lateral masses are within normal limits IMPRESSION: No acute osseous abnormality.  Mild degenerative change at C5-C6 Electronically Signed   By: Donavan Foil M.D.   On: 12/14/2019 19:56   DG Lumbar Spine Complete  Result Date: 12/14/2019 CLINICAL DATA:  Fall EXAM: LUMBAR SPINE - COMPLETE 4+ VIEW COMPARISON:  06/11/2015 FINDINGS: Lumbar alignment within normal limits. Vertebral body heights are maintained. Minimal degenerative change L4-L5. IMPRESSION: No acute osseous abnormality Electronically Signed   By: Donavan Foil M.D.   On: 12/14/2019 19:57   DG Wrist Complete Right  Result Date: 12/14/2019 CLINICAL DATA:  Fall EXAM: RIGHT WRIST - COMPLETE 3+ VIEW COMPARISON:  None. FINDINGS: There is no evidence of fracture or dislocation. There is no evidence of arthropathy or other focal bone abnormality. Soft tissues are unremarkable. IMPRESSION: Negative. Electronically Signed   By: Donavan Foil M.D.   On: 12/14/2019 19:58   DG Ankle Complete Right  Result Date: 12/14/2019 CLINICAL DATA:  Ankle pain fall EXAM: RIGHT ANKLE - COMPLETE 3+ VIEW COMPARISON:  06/11/2015 FINDINGS: No fracture or malalignment. Mild degenerative changes medially. Ankle mortise is symmetric. IMPRESSION: No acute osseous abnormality. Electronically Signed   By: Donavan Foil M.D.   On: 12/14/2019 16:48   DG Hand Complete Right  Result Date: 12/14/2019 CLINICAL DATA:  Fall EXAM: RIGHT HAND - COMPLETE 3+ VIEW COMPARISON:  None. FINDINGS: There is no evidence of fracture or dislocation. There is no evidence of arthropathy or other focal bone abnormality. Soft tissues are unremarkable. IMPRESSION: Negative. Electronically Signed   By: Donavan Foil M.D.   On: 12/14/2019 19:57    Procedures Procedures  (including critical care time)  Medications Ordered in ED Medications  oxyCODONE-acetaminophen (PERCOCET/ROXICET) 5-325 MG per tablet 1 tablet (1 tablet Oral Given 12/14/19 1736)  ibuprofen (ADVIL) tablet 600 mg (600 mg Oral Given 12/14/19 1736)    ED Course  I have reviewed the triage vital signs and the nursing notes.  Pertinent labs & imaging results that were available during my care of the patient were reviewed by me and considered in my medical decision making (see chart for details).    MDM Rules/Calculators/A&P                      Pt with mechanical fall that resulted in multiple minor injuries.  XR's are negative.  NV intact.  Pt is ambulatory.  injuries are likely musculoskeletal.  Pt agrees to symptomatic treatment.    ASO brace applied to right ankle.  F/u info given for orthopedics.     Final Clinical Impression(s) / ED Diagnoses Final diagnoses:  Sprain of right ankle, unspecified ligament, initial encounter  Strain of lumbar region, initial encounter  Sprain of right wrist, initial encounter    Rx / DC Orders ED Discharge Orders         Ordered    meloxicam (MOBIC) 7.5 MG tablet  Daily     12/14/19 2009           Kem Parkinson, [place] 12/16/19 1124    Carmin Muskrat, MD 12/19/19 1433

## 2021-04-01 ENCOUNTER — Encounter: Payer: Medicaid Other | Admitting: Adult Health

## 2021-04-26 ENCOUNTER — Encounter: Payer: Medicaid Other | Admitting: Adult Health

## 2021-05-03 ENCOUNTER — Telehealth: Payer: Self-pay

## 2021-06-17 ENCOUNTER — Other Ambulatory Visit: Payer: Self-pay

## 2021-06-17 ENCOUNTER — Ambulatory Visit (INDEPENDENT_AMBULATORY_CARE_PROVIDER_SITE_OTHER): Payer: Medicaid Other | Admitting: Obstetrics & Gynecology

## 2021-06-17 ENCOUNTER — Encounter: Payer: Self-pay | Admitting: Obstetrics & Gynecology

## 2021-06-17 VITALS — BP 126/82 | HR 67 | Ht 64.0 in | Wt 136.0 lb

## 2021-06-17 DIAGNOSIS — N6311 Unspecified lump in the right breast, upper outer quadrant: Secondary | ICD-10-CM | POA: Diagnosis not present

## 2021-06-17 NOTE — Progress Notes (Signed)
Chief Complaint  Patient presents with   Lump in right breast      45 y.o. [id number] Patient's last menstrual period was 06/07/2021. The current method of family planning is .  Outpatient Encounter Medications as of 06/17/2021  Medication Sig Note   cloNIDine (CATAPRES) 0.1 MG tablet Take 0.1 mg by mouth daily as needed (for high blood pressure).     gabapentin (NEURONTIN) 100 MG capsule Take 100 mg by mouth 3 (three) times daily.    meloxicam (MOBIC) 7.5 MG tablet Take 1 tablet (7.5 mg total) by mouth daily.    mirtazapine (REMERON) 15 MG tablet Take 15 mg by mouth at bedtime.    naproxen (NAPROSYN) 500 MG tablet Take 1 tablet (500 mg total) by mouth 2 (two) times daily.    SUBOXONE 8-2 MG FILM Place under the tongue 4 (four) times daily.    albuterol (VENTOLIN HFA) 108 (90 Base) MCG/ACT inhaler Inhale 2 puffs into the lungs every 6 (six) hours as needed. For shortness of breath (Patient not taking: Reported on 06/17/2021) 12/14/2019: Patient has been without for 2 months and states that she needs a prescription for this   hydrOXYzine (ATARAX/VISTARIL) 25 MG tablet Take 25 mg by mouth 3 (three) times daily as needed for anxiety.  (Patient not taking: Reported on 06/17/2021) 12/14/2019: Patient states ineffective   mirtazapine (REMERON) 15 MG tablet Take by mouth. (Patient not taking: Reported on 06/17/2021) 06/17/2021: patient will pick up   ondansetron (ZOFRAN) 4 MG tablet Take 1 tablet (4 mg total) by mouth every 6 (six) hours. (Patient not taking: No sig reported)    [DISCONTINUED] baclofen (LIORESAL) 10 MG tablet Take 10 mg by mouth 3 (three) times daily as needed. (Patient not taking: Reported on 06/17/2021)    [DISCONTINUED] celecoxib (CELEBREX) 100 MG capsule Take 100 mg by mouth 2 (two) times daily as needed. (Patient not taking: Reported on 06/17/2021)    [DISCONTINUED] traZODone (DESYREL) 100 MG tablet Take 100 mg by mouth at bedtime as needed for sleep.  (Patient not taking:  Reported on 06/17/2021)    No facility-administered encounter medications on file as of 06/17/2021.    Subjective Pt with 3 months of tender breast mass of the right breast Not really red No symptoms on the left  Past Medical History:  Diagnosis Date   Anxiety    Back pain     Past Surgical History:  Procedure Laterality Date   CHOLECYSTECTOMY     ORTHOPEDIC SURGERY      OB History     Gravida  4   Para  2   Term  2   Preterm      AB  2   Living  2      SAB  1   IAB      Ectopic      Multiple      Live Births              Allergies  Allergen Reactions   Hydrocodone Nausea And Vomiting   Sulfa Antibiotics Nausea And Vomiting   Morphine And Related Rash    Social History   Socioeconomic History   Marital status: Legally Separated    Spouse name: Not on file   Number of children: Not on file   Years of education: Not on file   Highest education level: Not on file  Occupational History   Not on file  Tobacco Use   Smoking status: Some  Days    Packs/day: 1.00    Years: 5.00    Pack years: 5.00    Types: Cigarettes    Last attempt to quit: 09/18/2013    Years since quitting: 7.7   Smokeless tobacco: Never  Vaping Use   Vaping Use: Never used  Substance and Sexual Activity   Alcohol use: No    Comment: occasionally   Drug use: No   Sexual activity: Yes    Birth control/protection: None  Other Topics Concern   Not on file  Social History Narrative   Not on file   Social Determinants of Health   Financial Resource Strain: Low Risk    Difficulty of Paying Living Expenses: Not very hard  Food Insecurity: No Food Insecurity   Worried About Charity fundraiser in the Last Year: Never true   Ran Out of Food in the Last Year: Never true  Transportation Needs: No Transportation Needs   Lack of Transportation (Medical): No   Lack of Transportation (Non-Medical): No  Physical Activity: Sufficiently Active   Days of Exercise per Week:  5 days   Minutes of Exercise per Session: 30 min  Stress: Stress Concern Present   Feeling of Stress : Rather much  Social Connections: Socially Isolated   Frequency of Communication with Friends and Family: Twice a week   Frequency of Social Gatherings with Friends and Family: Never   Attends Religious Services: 1 to 4 times per year   Active Member of [employer] or Organizations: No   Attends Music therapist: Never   Marital Status: Divorced    Family History  Problem Relation Age of Onset   Cancer Paternal Grandfather    Cancer Paternal Grandmother        lung   Heart disease Brother    Asthma Daughter    Asthma Daughter     Medications:       Current Outpatient Medications:    cloNIDine (CATAPRES) 0.1 MG tablet, Take 0.1 mg by mouth daily as needed (for high blood pressure). , Disp: , Rfl:    gabapentin (NEURONTIN) 100 MG capsule, Take 100 mg by mouth 3 (three) times daily., Disp: , Rfl:    meloxicam (MOBIC) 7.5 MG tablet, Take 1 tablet (7.5 mg total) by mouth daily., Disp: 14 tablet, Rfl: 0   mirtazapine (REMERON) 15 MG tablet, Take 15 mg by mouth at bedtime., Disp: , Rfl:    naproxen (NAPROSYN) 500 MG tablet, Take 1 tablet (500 mg total) by mouth 2 (two) times daily., Disp: 30 tablet, Rfl: 0   SUBOXONE 8-2 MG FILM, Place under the tongue 4 (four) times daily., Disp: , Rfl:    albuterol (VENTOLIN HFA) 108 (90 Base) MCG/ACT inhaler, Inhale 2 puffs into the lungs every 6 (six) hours as needed. For shortness of breath (Patient not taking: Reported on 06/17/2021), Disp: , Rfl:    hydrOXYzine (ATARAX/VISTARIL) 25 MG tablet, Take 25 mg by mouth 3 (three) times daily as needed for anxiety.  (Patient not taking: Reported on 06/17/2021), Disp: , Rfl:    mirtazapine (REMERON) 15 MG tablet, Take by mouth. (Patient not taking: Reported on 06/17/2021), Disp: , Rfl:    ondansetron (ZOFRAN) 4 MG tablet, Take 1 tablet (4 mg total) by mouth every 6 (six) hours. (Patient not taking: No  sig reported), Disp: 12 tablet, Rfl: 0  Objective Blood pressure 126/82, pulse 67, height 5\' 4"  (1.626 m), weight 136 lb (61.7 kg), last menstrual period 06/07/2021.  3  cm tender mass pn the right no axillary nodes palpable Left breast is negative, no masses non tender axialla normal  Pertinent ROS No burning with urination, frequency or urgency No nausea, vomiting or diarrhea Nor fever chills or other constitutional symptoms   Labs or studies     Impression Diagnoses this Encounter::   ICD-10-CM   1. Mass of upper outer quadrant of right breast, tender, present for 3 months  N63.11 US BREAST LTD UNI LEFT INC AXILLA    MM DIAG BREAST TOMO BILATERAL    US BREAST LTD UNI RIGHT INC AXILLA    US BREAST LTD UNI RIGHT INC AXILLA    MM DIAG BREAST TOMO BILATERAL    US BREAST LTD UNI LEFT INC AXILLA    CANCELED: MM Digital Diagnostic Bilat    CANCELED: US BREAST COMPLETE UNI RIGHT INC AXILLA      Established relevant diagnosis(es):   Plan/Recommendations: No orders of the defined types were placed in this encounter.   Labs or Scans Ordered: Orders Placed This Encounter  Procedures   US BREAST LTD UNI LEFT INC AXILLA   MM DIAG BREAST TOMO BILATERAL   US BREAST LTD UNI RIGHT INC AXILLA    Management:: Dx mammogram + sonogram is ordered, 07/23/21 scheduled  Follow up Return if symptoms worsen or fail to improve.       All questions were answered.

## 2021-07-23 ENCOUNTER — Ambulatory Visit (HOSPITAL_COMMUNITY): Payer: Medicaid Other

## 2021-07-23 ENCOUNTER — Inpatient Hospital Stay (HOSPITAL_COMMUNITY): Admission: RE | Admit: 2021-07-23 | Payer: Medicaid Other | Source: Ambulatory Visit

## 2021-07-31 ENCOUNTER — Other Ambulatory Visit (HOSPITAL_COMMUNITY): Payer: Self-pay | Admitting: Obstetrics & Gynecology

## 2021-07-31 ENCOUNTER — Encounter (HOSPITAL_COMMUNITY): Payer: Medicaid Other

## 2021-07-31 ENCOUNTER — Ambulatory Visit (HOSPITAL_COMMUNITY): Payer: Medicaid Other

## 2021-07-31 DIAGNOSIS — N6311 Unspecified lump in the right breast, upper outer quadrant: Secondary | ICD-10-CM

## 2021-09-03 ENCOUNTER — Ambulatory Visit (HOSPITAL_COMMUNITY): Payer: Medicaid Other

## 2021-09-03 ENCOUNTER — Inpatient Hospital Stay (HOSPITAL_COMMUNITY): Admission: RE | Admit: 2021-09-03 | Payer: Medicaid Other | Source: Ambulatory Visit

## 2021-10-01 ENCOUNTER — Ambulatory Visit (HOSPITAL_COMMUNITY)
Admission: RE | Admit: 2021-10-01 | Discharge: 2021-10-01 | Disposition: A | Discharge status: Home / Self Care | Payer: Medicaid Other | Source: Ambulatory Visit | Attending: Obstetrics & Gynecology | Admitting: Obstetrics & Gynecology

## 2021-10-01 ENCOUNTER — Other Ambulatory Visit: Payer: Self-pay

## 2021-10-01 ENCOUNTER — Other Ambulatory Visit (HOSPITAL_COMMUNITY): Payer: Self-pay | Admitting: Obstetrics & Gynecology

## 2021-10-01 DIAGNOSIS — N6311 Unspecified lump in the right breast, upper outer quadrant: Secondary | ICD-10-CM | POA: Diagnosis present

## 2021-10-01 DIAGNOSIS — R928 Other abnormal and inconclusive findings on diagnostic imaging of breast: Secondary | ICD-10-CM

## 2021-10-08 ENCOUNTER — Other Ambulatory Visit (HOSPITAL_COMMUNITY): Payer: Self-pay | Admitting: Obstetrics & Gynecology

## 2021-10-08 ENCOUNTER — Ambulatory Visit (HOSPITAL_COMMUNITY)
Admission: RE | Admit: 2021-10-08 | Discharge: 2021-10-08 | Disposition: A | Discharge status: Home / Self Care | Payer: Medicaid Other | Source: Ambulatory Visit | Attending: Obstetrics & Gynecology | Admitting: Obstetrics & Gynecology

## 2021-10-08 ENCOUNTER — Other Ambulatory Visit: Payer: Self-pay

## 2021-10-08 ENCOUNTER — Encounter (HOSPITAL_COMMUNITY): Payer: Self-pay

## 2021-10-08 DIAGNOSIS — R928 Other abnormal and inconclusive findings on diagnostic imaging of breast: Secondary | ICD-10-CM

## 2021-10-08 MED ORDER — LIDOCAINE HCL (PF) 2 % IJ SOLN
INTRAMUSCULAR | Status: AC
  Filled 2021-10-08: qty 20

## 2021-10-08 MED ORDER — LIDOCAINE-EPINEPHRINE (PF) 1 %-1:200000 IJ SOLN
INTRAMUSCULAR | Status: AC
  Filled 2021-10-08: qty 30

## 2021-10-14 LAB — SURGICAL PATHOLOGY

## 2021-10-28 DIAGNOSIS — C50911 Malignant neoplasm of unspecified site of right female breast: Secondary | ICD-10-CM | POA: Insufficient documentation

## 2021-10-29 ENCOUNTER — Encounter (HOSPITAL_COMMUNITY): Payer: Self-pay | Admitting: Hematology

## 2021-10-29 ENCOUNTER — Other Ambulatory Visit: Payer: Self-pay

## 2021-10-29 ENCOUNTER — Encounter (HOSPITAL_COMMUNITY): Payer: Self-pay

## 2021-10-29 ENCOUNTER — Ambulatory Visit (HOSPITAL_BASED_OUTPATIENT_CLINIC_OR_DEPARTMENT_OTHER)
Admission: RE | Admit: 2021-10-29 | Discharge: 2021-10-29 | Disposition: A | Discharge status: Home / Self Care | Payer: Medicaid Other | Source: Ambulatory Visit | Attending: Hematology | Admitting: Hematology

## 2021-10-29 ENCOUNTER — Other Ambulatory Visit (HOSPITAL_COMMUNITY): Payer: Medicaid Other

## 2021-10-29 ENCOUNTER — Inpatient Hospital Stay (HOSPITAL_COMMUNITY): Payer: Medicaid Other | Attending: Hematology | Admitting: Hematology

## 2021-10-29 ENCOUNTER — Other Ambulatory Visit (HOSPITAL_COMMUNITY): Payer: Self-pay

## 2021-10-29 DIAGNOSIS — Z801 Family history of malignant neoplasm of trachea, bronchus and lung: Secondary | ICD-10-CM | POA: Diagnosis not present

## 2021-10-29 DIAGNOSIS — Z79899 Other long term (current) drug therapy: Secondary | ICD-10-CM | POA: Insufficient documentation

## 2021-10-29 DIAGNOSIS — N644 Mastodynia: Secondary | ICD-10-CM | POA: Insufficient documentation

## 2021-10-29 DIAGNOSIS — C50111 Malignant neoplasm of central portion of right female breast: Secondary | ICD-10-CM | POA: Insufficient documentation

## 2021-10-29 DIAGNOSIS — C773 Secondary and unspecified malignant neoplasm of axilla and upper limb lymph nodes: Secondary | ICD-10-CM | POA: Diagnosis not present

## 2021-10-29 DIAGNOSIS — Z87891 Personal history of nicotine dependence: Secondary | ICD-10-CM | POA: Diagnosis not present

## 2021-10-29 DIAGNOSIS — F419 Anxiety disorder, unspecified: Secondary | ICD-10-CM | POA: Diagnosis not present

## 2021-10-29 DIAGNOSIS — M899 Disorder of bone, unspecified: Secondary | ICD-10-CM | POA: Insufficient documentation

## 2021-10-29 DIAGNOSIS — Z170 Estrogen receptor positive status [ER+]: Secondary | ICD-10-CM | POA: Insufficient documentation

## 2021-10-29 DIAGNOSIS — M549 Dorsalgia, unspecified: Secondary | ICD-10-CM | POA: Diagnosis not present

## 2021-10-29 DIAGNOSIS — Z0189 Encounter for other specified special examinations: Secondary | ICD-10-CM

## 2021-10-29 LAB — ECHOCARDIOGRAM COMPLETE
Area-P 1/2: 3.03 cm2
Calc EF: 63 %
Height: 64 in
S' Lateral: 2.4 cm
Single Plane A2C EF: 60.1 %
Single Plane A4C EF: 64.3 %
Weight: 2123.2 oz

## 2021-10-29 MED ORDER — ALPRAZOLAM 0.5 MG PO TABS: 0.5000 mg | ORAL_TABLET | Freq: Two times a day (BID) | ORAL | 0 refills | Status: DC | PRN

## 2021-10-29 MED ORDER — OXYCODONE-ACETAMINOPHEN 5-325 MG PO TABS: 1.0000 | ORAL_TABLET | Freq: Three times a day (TID) | ORAL | 0 refills | Status: DC | PRN

## 2021-10-29 NOTE — Progress Notes (Signed)
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 

## 2021-10-29 NOTE — Progress Notes (Signed)
I met with the patient and her significant other, Josh during and following initial visit with Dr. Delton Coombes. I introduced myself and explained my role in the patient's care. I provided my contact information and encouraged her to call with questions or concerns.  ?

## 2021-10-29 NOTE — Progress Notes (Signed)
*  PRELIMINARY RESULTS* ?Echocardiogram ?2D Echocardiogram has been performed. ? ?Sara [name] ?10/29/2021, 1:03 PM ?

## 2021-10-29 NOTE — Progress Notes (Signed)
? ?[facility] ?[address] ?[place], [place] [address] ? ? ?Patient Care Team: ?Neale Burly, MD as PCP - General (Internal Medicine) ?Derek Jack, MD as Medical Oncologist (Medical Oncology) ?Brien Mates, RN as Oncology Nurse Navigator (Oncology) ? ?CHIEF COMPLAINTS/PURPOSE OF CONSULTATION:  ?Newly diagnosed Her2+ locally advanced right breast cancer ? ?HISTORY OF PRESENTING ILLNESS:  ?Sara [name] 46 y.o. female is here because of recent diagnosis of Her2+ locally advanced right breast cancer. ? ?Today she reports feeling good, and she is accompanied by her boyfriend. She reports that she is nervous. She has chronic back pain due to a fracture in her back in 2013. She was previously taking Suboxone, but she was switched to Subutex following her breast cancer diagnosis. She noticed a mass in her right breast starting in July 2022 which she reports began to grow starting in January 2023 "from the size of a quarter to the size of an orange". She reports redness at the mass site starting in January and nipple inversion prior to her biopsy. She reports a current cellulitis for which she is taking clindamycin 3 times day.  ? ?She is not currently working, but she previously worked at a country club. She quit smoking 3 months ago, and she has smoked 1/2-1 ppd since 2011. She still currently vapes. Her paternal aunt passed from lung cancer at 86, her paternal grandmother had breast cancer in her 50s, her maternal grandfather had colon cancer, and her paternal grandfather had colon cancer. She has 2 daughters.  ? ?In terms of breast cancer risk profile:  ?She menarched at early age of 22 and she continues to menstruate. Her last menses was 03/05. ?She had 2 pregnancies, her first child was born at age 50  ?She has received birth control pills for approximately 5 years.  ?She has family history of Breast/GYN/GI cancer ? ?I reviewed her records extensively and collaborated the history with  the patient. ? ?SUMMARY OF ONCOLOGIC HISTORY: ?Oncology History  ? No history exists.  ? ?MEDICAL HISTORY:  ?Past Medical History:  ?Diagnosis Date  ? Anxiety   ? Back pain   ? ? ?SURGICAL HISTORY: ?Past Surgical History:  ?Procedure Laterality Date  ? CHOLECYSTECTOMY    ? ORTHOPEDIC SURGERY    ? ? ?SOCIAL HISTORY: ?Social History  ? ?Socioeconomic History  ? Marital status: Legally Separated  ?  Spouse name: Not on file  ? Number of children: Not on file  ? Years of education: Not on file  ? Highest education level: Not on file  ?Occupational History  ? Not on file  ?Tobacco Use  ? Smoking status: Some Days  ?  Packs/day: 1.00  ?  Years: 5.00  ?  Pack years: 5.00  ?  Types: Cigarettes  ?  Last attempt to quit: 09/18/2013  ?  Years since quitting: 8.1  ? Smokeless tobacco: Never  ?Vaping Use  ? Vaping Use: Never used  ?Substance and Sexual Activity  ? Alcohol use: No  ?  Comment: occasionally  ? Drug use: No  ? Sexual activity: Yes  ?  Birth control/protection: None  ?Other Topics Concern  ? Not on file  ?Social History Narrative  ? Not on file  ? ?Social Determinants of Health  ? ?Financial Resource Strain: Low Risk   ? Difficulty of Paying Living Expenses: Not very hard  ?Food Insecurity: No Food Insecurity  ? Worried About Charity fundraiser in the Last Year: Never  true  ? Ran Out of Food in the Last Year: Never true  ?Transportation Needs: No Transportation Needs  ? Lack of Transportation (Medical): No  ? Lack of Transportation (Non-Medical): No  ?Physical Activity: Sufficiently Active  ? Days of Exercise per Week: 5 days  ? Minutes of Exercise per Session: 30 min  ?Stress: Stress Concern Present  ? Feeling of Stress : Rather much  ?Social Connections: Socially Isolated  ? Frequency of Communication with Friends and Family: Twice a week  ? Frequency of Social Gatherings with Friends and Family: Never  ? Attends Religious Services: 1 to 4 times per year  ? Active Member of Clubs or Organizations: No  ? Attends  Archivist Meetings: Never  ? Marital Status: Divorced  ?Intimate Partner Violence: Not At Risk  ? Fear of Current or Ex-Partner: No  ? Emotionally Abused: No  ? Physically Abused: No  ? Sexually Abused: No  ? ? ?FAMILY HISTORY: ?Family History  ?Problem Relation Age of Onset  ? Cancer Paternal Grandfather   ? Cancer Paternal Grandmother   ?     lung  ? Heart disease Brother   ? Asthma Daughter   ? Asthma Daughter   ? ? ?ALLERGIES:  is allergic to hydrocodone, sulfa antibiotics, and morphine and related. ? ?MEDICATIONS:  ?Current Outpatient Medications  ?Medication Sig Dispense Refill  ? acetaminophen (TYLENOL) 325 MG tablet Take 650 mg by mouth every 6 (six) hours as needed.    ? buprenorphine (SUBUTEX) 8 MG SUBL SL tablet Place 8 mg under the tongue daily.    ? celecoxib (CELEBREX) 100 MG capsule Take 100 mg by mouth 2 (two) times daily as needed.    ? clindamycin (CLEOCIN) 300 MG capsule Take 300 mg by mouth every 6 (six) hours.    ? cloNIDine (CATAPRES) 0.1 MG tablet Take 0.1 mg by mouth daily as needed (for high blood pressure).     ? gabapentin (NEURONTIN) 100 MG capsule Take 100 mg by mouth 3 (three) times daily.    ? ibuprofen (ADVIL) 200 MG tablet Take 200 mg by mouth every 6 (six) hours as needed.    ? mirtazapine (REMERON) 15 MG tablet Take 15 mg by mouth at bedtime.    ? naproxen sodium (ALEVE) 220 MG tablet Take 220 mg by mouth daily as needed.    ? hydrOXYzine (ATARAX/VISTARIL) 25 MG tablet Take 25 mg by mouth 3 (three) times daily as needed for anxiety.  (Patient not taking: Reported on 06/17/2021)    ? traZODone (DESYREL) 100 MG tablet Take 100 mg by mouth at bedtime as needed. (Patient not taking: Reported on 10/29/2021)    ? ?No current facility-administered medications for this visit.  ? ? ?REVIEW OF SYSTEMS:   ?Review of Systems  ?Constitutional:  Negative for appetite change and fatigue.  ?Gastrointestinal:  Positive for constipation and nausea.  ?All other systems reviewed and are  negative. ? ?PHYSICAL EXAMINATION: ?ECOG PERFORMANCE STATUS: 0 - Asymptomatic ? ?Vitals:  ? 10/29/21 0831  ?BP: (!) 109/54  ?Pulse: 82  ?Resp: 18  ?Temp: (!) 97.3 ?F (36.3 ?C)  ?SpO2: 99%  ? ?Filed Weights  ? 10/29/21 0831  ?Weight: 132 lb 11.2 oz (60.2 kg)  ? ?Physical Exam ?Vitals reviewed.  ?Constitutional:   ?   Appearance: Normal appearance.  ?Cardiovascular:  ?   Rate and Rhythm: Normal rate and regular rhythm.  ?   Pulses: Normal pulses.  ?   Heart sounds: Normal heart  sounds.  ?Pulmonary:  ?   Effort: Pulmonary effort is normal.  ?   Breath sounds: Normal breath sounds.  ?Chest:  ?Breasts: ?   Right: Swelling, mass (whole of right breast) and skin change (with erythema, warmth, and Peau d'orange appearance) present. No bleeding, inverted nipple, nipple discharge or tenderness.  ?Lymphadenopathy:  ?   Upper Body:  ?   Right upper body: Axillary adenopathy (2 cm) present. No supraclavicular adenopathy.  ?   Left upper body: No supraclavicular or axillary adenopathy.  ?Skin: ?   General: Skin is warm.  ?   Findings: Erythema (R breast) present.  ?Neurological:  ?   General: No focal deficit present.  ?   Mental Status: She is alert and oriented to person, place, and time.  ?Psychiatric:     ?   Mood and Affect: Mood normal.     ?   Behavior: Behavior normal.  ? ? ?Breast Exam Chaperone: Thana Ates   ? ?LABORATORY DATA:  ?I have reviewed the data as listed ?Recent Results (from the past 2160 hour(s))  ?Surgical pathology     Status: None  ? Collection Time: 10/08/21  9:17 AM  ?Result Value Ref Range  ? SURGICAL PATHOLOGY    ?  SURGICAL PATHOLOGY ?** THIS IS AN ADDENDUM REPORT ** ?CASE: [id number] ?PATIENT: Sara [name] ?Surgical Pathology Report ?**Addendum ** ? ?Reason for Addendum #1:  Additional special stains ?Reason for Addendum #2:  Breast Biomarker Results ? ?Clinical History: right breast mass and adenopathy ? ? ? ? ?FINAL MICROSCOPIC DIAGNOSIS: ? ?A. LYMPH NODE AXILLARY, RIGHT, BIOPSY: ?    -  Mammary carcinoma, poorly differentiated (see comment). ? ?B. BREAST, MASS, RIGHT, BIOPSY: ?    - Mammary carcinoma, poorly differentiated (see comment). ? ?Comment: There is invasive mammary carcinoma. ?Tubular

## 2021-10-29 NOTE — Patient Instructions (Addendum)
[place] at [facility] ?Discharge Instructions ? ?You were seen and examined today by Dr. Delton Coombes. Dr. Delton Coombes is a medical oncologist, meaning that he specializes in the treatment of cancer diagnoses. Dr. Delton Coombes discussed your past medical history, family history of cancers, and the events that led to you being here today. ? ?You were referred to Dr. Delton Coombes due to your recent diagnosis of Invasive Mammary Carcinoma. This is a common type of breast cancer. It is Estrogen Receptor and HER2 Receptor positive. This means that it is being fed by estrogen. HER2 receptor positivity is associated with a more aggressive nature of the cancer. There was also lymph node involvement noted on your biopsy. ? ?Dr. Delton Coombes has recommended a PET scan. A PET scan is a specialized CT scan that will illuminate where there is cancer present in your body. This will accurately identify the stage of the cancer. Dr. Delton Coombes also recommends genetic testing. This will tell us if you have a genetic factor contributing to your cancer. ? ?At the very least, you will need to start chemotherapy as soon as possible. Prior to starting this, you will need a port-a-cath and an echocardiogram to establish baseline cardiac function. ? ?Follow-up with Dr. Delton Coombes following the PET scan. ? ? ?Thank you for choosing [place] at [facility] to provide your oncology and hematology care.  To afford each patient quality time with our provider, please arrive at least 15 minutes before your scheduled appointment time.  ? ?If you have a lab appointment with the [facility] please come in thru the Main Entrance and check in at the main information desk. ? ?You need to re-schedule your appointment should you arrive 10 or more minutes late.  We strive to give you quality time with our providers, and arriving late affects you and other patients whose appointments are after yours.  Also, if  you no show three or more times for appointments you may be dismissed from the clinic at the providers discretion.     ?Again, thank you for choosing [facility].  Our hope is that these requests will decrease the amount of time that you wait before being seen by our physicians.       ?_____________________________________________________________ ? ?Should you have questions after your visit to [facility], please contact our office at [phone] and follow the prompts.  Our office hours are 8:00 a.m. and 4:30 p.m. Monday - Friday.  Please note that voicemails left after 4:00 p.m. may not be returned until the following business day.  We are closed weekends and major holidays.  You do have access to a nurse 24-7, just call the main number to the clinic [phone] and do not press any options, hold on the line and a nurse will answer the phone.   ? ?For prescription refill requests, have your pharmacy contact our office and allow 72 hours.   ? ?Due to Covid, you will need to wear a mask upon entering the hospital. If you do not have a mask, a mask will be given to you at the Main Entrance upon arrival. For doctor visits, patients may have 1 support person age 76 or older with them. For treatment visits, patients can not have anyone with them due to social distancing guidelines and our immunocompromised population.  ? ? ? ?

## 2021-10-31 ENCOUNTER — Other Ambulatory Visit (HOSPITAL_COMMUNITY): Payer: Medicaid Other

## 2021-10-31 ENCOUNTER — Ambulatory Visit: Payer: Medicaid Other | Admitting: General Surgery

## 2021-11-01 ENCOUNTER — Ambulatory Visit: Payer: Medicaid Other | Admitting: General Surgery

## 2021-11-04 ENCOUNTER — Telehealth: Payer: Self-pay | Admitting: Genetic Counselor

## 2021-11-04 NOTE — Progress Notes (Deleted)
REFERRING PROVIDER: ?Neale Burly, MD ?[place] ?[place],  [place] [address] ? ?PRIMARY PROVIDER:  ?Neale Burly, MD ? ?PRIMARY REASON FOR VISIT:  ?No diagnosis found. ? ? ?HISTORY OF PRESENT ILLNESS:   ?Ms. Sara [name], a 46 y.o. female, was seen for a [facility] cancer genetics consultation at the request of Dr. Sherrie Sport due to a {Personal/family:20331} history of {cancer/polyps}.  Ms. Runkles presents to clinic today to discuss the possibility of a hereditary predisposition to cancer, to discuss genetic testing, and to further clarify her future cancer risks, as well as potential cancer risks for family members.  ? ?In ***, at the age of ***, Ms. Sara [name] was diagnosed with {CA PATHOLOGY:63853} of the {right left (wildcard):15202} {CA UEAVW:09811}. The treatment plan ***.  ? ? *** Ms. Sara [name] is a 46 y.o. female with no personal history of cancer.   ? ?CANCER HISTORY:  ?Oncology History  ?Breast cancer, right breast ([facility])  ?10/28/2021 Initial Diagnosis  ? Breast cancer, right breast [facility]) ?  ?11/12/2021 -  Chemotherapy  ? Patient is on Treatment Plan : BREAST DOCEtaxel + Trastuzumab + Pertuzumab (THP) q21d x 8 cycles / Trastuzumab + Pertuzumab q21d x 4 cycles  ?   ? ? ? ?RISK FACTORS:  ?Menarche was at age ***.  ?First live birth at age ***.  ?OCP use for approximately {Numbers 1-12 multi-select:20307} years.  ?Ovaries intact: {Yes/No-Ex:120004}.  ?Uterus intact: {Yes/No-Ex:120004}.  ?Menopausal status: {Menopause:31378}.  ?HRT use: {Numbers 1-12 multi-select:20307} years. ?Colonoscopy: {Yes/No-Ex:120004}; {normal/abnormal/not examined:14677}. ?Mammogram within the last year: {Yes/No-Ex:120004}. ?Number of breast biopsies: {Numbers 1-12 multi-select:20307}. ?Up to date with pelvic exams: {Yes/No-Ex:120004}. ?Any excessive radiation exposure in the past: {Yes/No-Ex:120004} ? ?Past Medical History:  ?Diagnosis Date  ? Anxiety   ? Back pain   ? ? ?Past Surgical History:  ?Procedure Laterality Date  ? CHOLECYSTECTOMY    ?  ORTHOPEDIC SURGERY    ? ? ?Social History  ? ?Socioeconomic History  ? Marital status: Legally Separated  ?  Spouse name: Not on file  ? Number of children: Not on file  ? Years of education: Not on file  ? Highest education level: Not on file  ?Occupational History  ? Not on file  ?Tobacco Use  ? Smoking status: Some Days  ?  Packs/day: 1.00  ?  Years: 5.00  ?  Pack years: 5.00  ?  Types: Cigarettes  ?  Last attempt to quit: 09/18/2013  ?  Years since quitting: 8.1  ? Smokeless tobacco: Never  ?Vaping Use  ? Vaping Use: Never used  ?Substance and Sexual Activity  ? Alcohol use: No  ?  Comment: occasionally  ? Drug use: No  ? Sexual activity: Yes  ?  Birth control/protection: None  ?Other Topics Concern  ? Not on file  ?Social History Narrative  ? Not on file  ? ?Social Determinants of Health  ? ?Financial Resource Strain: Low Risk   ? Difficulty of Paying Living Expenses: Not very hard  ?Food Insecurity: No Food Insecurity  ? Worried About Charity fundraiser in the Last Year: Never true  ? Ran Out of Food in the Last Year: Never true  ?Transportation Needs: No Transportation Needs  ? Lack of Transportation (Medical): No  ? Lack of Transportation (Non-Medical): No  ?Physical Activity: Sufficiently Active  ? Days of Exercise per Week: 5 days  ? Minutes of Exercise per Session: 30 min  ?Stress: Stress Concern Present  ? Feeling of Stress :  Rather much  ?Social Connections: Socially Isolated  ? Frequency of Communication with Friends and Family: Twice a week  ? Frequency of Social Gatherings with Friends and Family: Never  ? Attends Religious Services: 1 to 4 times per year  ? Active Member of Clubs or Organizations: No  ? Attends Archivist Meetings: Never  ? Marital Status: Divorced  ?  ? ?FAMILY HISTORY:  ?We obtained a detailed, 4-generation family history.  Significant diagnoses are listed below: ?Family History  ?Problem Relation Age of Onset  ? Cancer Paternal Grandfather   ? Cancer Paternal Grandmother    ?     lung  ? Heart disease Brother   ? Asthma Daughter   ? Asthma Daughter   ? ? ?Ms. Sara [name] is {aware/unaware} of previous family history of genetic testing for hereditary cancer risks. Patient's maternal ancestors are of *** descent, and paternal ancestors are of *** descent. There {IS NO:12509} reported Ashkenazi Jewish ancestry. There {IS NO:12509} known consanguinity. ? ?GENETIC COUNSELING ASSESSMENT: Ms. Sara [name] is a 46 y.o. female with a {Personal/family:20331} history of {cancer/polyps} which is somewhat suggestive of a {DISEASE} and predisposition to cancer given ***. We, therefore, discussed and recommended the following at today's visit.  ? ?DISCUSSION: We discussed that 5 - 10% of cancer is hereditary, with most cases of *** associated with ***.  There are other genes that can be associated with hereditary *** cancer syndromes.  We discussed that testing is beneficial for several reasons including knowing how to follow individuals after completing their treatment, identifying whether potential treatment options *** would be beneficial, and understanding if other family members could be at risk for cancer and allowing them to undergo genetic testing.  ? ?We reviewed the characteristics, features and inheritance patterns of hereditary cancer syndromes. We also discussed genetic testing, including the appropriate family members to test, the process of testing, insurance coverage and turn-around-time for results. We discussed the implications of a negative, positive, carrier and/or variant of uncertain significant result. We recommended Ms. Sara [name] pursue genetic testing for a panel that includes genes associated with *** cancer.  ? ?Ms. Sara [name]  was offered a common hereditary cancer panel (47 genes) and an expanded pan-cancer panel (84 genes). Ms. Sara [name] was informed of the benefits and limitations of each panel, including that expanded pan-cancer panels contain genes that do not have clear management  guidelines at this point in time.  We also discussed that as the number of genes included on a panel increases, the chances of variants of uncertain significance increases.  After considering the benefits and limitations of each gene panel, Ms. Manville  elected to have *** through ***. ? ?Based on Ms. Veiga's {Personal/family:20331} history of cancer, she meets medical criteria for genetic testing. Despite that she meets criteria, she may still have an out of pocket cost. We discussed that if her out of pocket cost for testing is over $100, the laboratory will call and confirm whether she wants to proceed with testing.  If the out of pocket cost of testing is less than $100 she will be billed by the genetic testing laboratory.  ? ?***We reviewed the characteristics, features and inheritance patterns of hereditary cancer syndromes. We also discussed genetic testing, including the appropriate family members to test, the process of testing, insurance coverage and turn-around-time for results. We discussed the implications of a negative, positive and/or variant of uncertain significant result. In order to get genetic test results in a timely manner so that  Ms. Bumpus can use these genetic test results for surgical decisions, we recommended Ms. Fazzini pursue genetic testing for the ***. Once complete, we recommend Ms. Falzon pursue reflex genetic testing to the *** gene panel.  ? ?Based on Ms. Lenz's {Personal/family:20331} history of cancer, she meets medical criteria for genetic testing. Despite that she meets criteria, she may still have an out of pocket cost.  ? ?***We discussed with Ms. Wease that the {Personal/family:20331} history does not meet insurance or NCCN criteria for genetic testing and, therefore, is not highly consistent with a familial hereditary cancer syndrome.  We feel she is at low risk to harbor a gene mutation associated with such a condition. Thus, we did not recommend any genetic testing, at this  time, and recommended Ms. Bonet continue to follow the cancer screening guidelines given by her primary healthcare provider. ? ?PLAN: After considering the risks, benefits, and limitations, Ms. Talbert Nan provided i

## 2021-11-05 ENCOUNTER — Inpatient Hospital Stay: Payer: Medicaid Other | Admitting: Genetic Counselor

## 2021-11-07 ENCOUNTER — Ambulatory Visit (HOSPITAL_COMMUNITY): Admission: RE | Admit: 2021-11-07 | Payer: Medicaid Other | Source: Ambulatory Visit

## 2021-11-07 ENCOUNTER — Encounter (HOSPITAL_COMMUNITY): Payer: Self-pay

## 2021-11-07 ENCOUNTER — Other Ambulatory Visit (HOSPITAL_COMMUNITY): Payer: Self-pay | Admitting: Physician Assistant

## 2021-11-07 DIAGNOSIS — F419 Anxiety disorder, unspecified: Secondary | ICD-10-CM

## 2021-11-07 DIAGNOSIS — G893 Neoplasm related pain (acute) (chronic): Secondary | ICD-10-CM

## 2021-11-07 MED ORDER — OXYCODONE-ACETAMINOPHEN 7.5-325 MG PO TABS: 1.0000 | ORAL_TABLET | Freq: Three times a day (TID) | ORAL | 0 refills | Status: DC | PRN

## 2021-11-07 MED ORDER — ALPRAZOLAM 0.5 MG PO TABS: 0.5000 mg | ORAL_TABLET | Freq: Three times a day (TID) | ORAL | 0 refills | Status: DC | PRN

## 2021-11-07 NOTE — Progress Notes (Signed)
Patient called reporting increased pain medication and anxiety medication need. Patient reports low grade fever, never getting over 100.4. Patient reports taking two tabs of oxycodone-acetaminophen 5-'325mg'$  every 6hrs and xanax '1mg'$  every 8hrs. Patient's PET scan delayed due to low grade fever. Tarri Abernethy, PA and Dr. Delton Coombes both made aware. Orders for oxycodone-acetaminophen 7.5-'325mg'$  every 8hrs PRN and xanax 0.'5mg'$  every 8hrs PRN to be sent by Tarri Abernethy, PA. Patient made aware of instructions. Patient made aware that low-grade fever could be cancer related, to please keep appts as scheduled. Patient verbalized understanding.  ?

## 2021-11-08 ENCOUNTER — Other Ambulatory Visit (HOSPITAL_COMMUNITY): Payer: Self-pay | Admitting: Physician Assistant

## 2021-11-08 DIAGNOSIS — F419 Anxiety disorder, unspecified: Secondary | ICD-10-CM

## 2021-11-08 DIAGNOSIS — G893 Neoplasm related pain (acute) (chronic): Secondary | ICD-10-CM

## 2021-11-08 MED ORDER — ALPRAZOLAM 0.5 MG PO TABS: 0.5000 mg | ORAL_TABLET | Freq: Three times a day (TID) | ORAL | 0 refills | Status: DC | PRN

## 2021-11-08 MED ORDER — OXYCODONE-ACETAMINOPHEN 7.5-325 MG PO TABS: 1.0000 | ORAL_TABLET | Freq: Three times a day (TID) | ORAL | 0 refills | Status: DC | PRN

## 2021-11-11 ENCOUNTER — Ambulatory Visit (HOSPITAL_COMMUNITY): Payer: Medicaid Other | Admitting: Hematology

## 2021-11-12 ENCOUNTER — Encounter: Payer: Self-pay | Admitting: General Surgery

## 2021-11-12 ENCOUNTER — Ambulatory Visit (INDEPENDENT_AMBULATORY_CARE_PROVIDER_SITE_OTHER): Payer: Medicaid Other | Admitting: General Surgery

## 2021-11-12 ENCOUNTER — Other Ambulatory Visit: Payer: Self-pay

## 2021-11-12 ENCOUNTER — Encounter: Payer: Self-pay | Admitting: *Deleted

## 2021-11-12 VITALS — BP 125/79 | HR 77 | Temp 98.2°F | Resp 16 | Ht 64.0 in | Wt 129.0 lb

## 2021-11-12 DIAGNOSIS — C50911 Malignant neoplasm of unspecified site of right female breast: Secondary | ICD-10-CM

## 2021-11-12 NOTE — Progress Notes (Signed)
[facility] History and Physical ? ?Reason for Referral: Port placement, right breast cancer  ?Referring Physician: Dr. Delton Coombes  ? ?Chief Complaint   ?New Patient (Initial Visit) ?  ? ? ?Sara [name] is a 46 y.o. female.  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 Elonda Husky attention in October.  She noticed a mass in July 2022 and had this grew from the size of a quarter to an orange. She developed redness and inverted nipple prior to her biopsy in January. She is getting her neoadjuvant therapy and radiation and needs her port placed ASAP.  She says that her right breast is very painful. She has a history of Chronic Pain managed by Dr. Valetta Close in [place]. ? ?She had menarche at age 81, and her first pregnancy at age 89. She is G2P2.  She has a history of any family breast cancer in her paternal grandmother.  She has not had any chest radiation. ? ? ?Past Medical History:  ?Diagnosis Date  ? Anxiety   ? Back pain   ? ? ?Past Surgical History:  ?Procedure Laterality Date  ? CHOLECYSTECTOMY    ? ORTHOPEDIC SURGERY    ? ? ?Family History  ?Problem Relation Age of Onset  ? Cancer Paternal Grandfather   ? Cancer Paternal Grandmother   ?     lung  ? Heart disease Brother   ? Asthma Daughter   ? Asthma Daughter   ? ? ?Social History  ? ?Tobacco Use  ? Smoking status: Former  ?  Packs/day: 1.00  ?  Years: 5.00  ?  Pack years: 5.00  ?  Types: Cigarettes  ?  Quit date: 11/08/2021  ?  Years since quitting: 0.0  ? Smokeless tobacco: Never  ?Vaping Use  ? Vaping Use: Never used  ?Substance Use Topics  ? Alcohol use: No  ?  Comment: occasionally  ? Drug use: No  ? ? ?Medications: I have reviewed the patient's current medications. ?Allergies as of 11/12/2021   ? ?   Reactions  ? Hydrocodone Nausea And Vomiting  ? Sulfa Antibiotics Nausea And Vomiting  ? Morphine And Related Rash  ? ?  ? ?  ?Medication List  ?  ? ?  ? Accurate as of November 12, 2021 11:56  AM. If you have any questions, ask your nurse or doctor.  ?  ?  ? ?  ? ?STOP taking these medications   ? ?hydrOXYzine 25 MG tablet ?Commonly known as: ATARAX ?Stopped by: Virl Cagey, MD ?  ?traZODone 100 MG tablet ?Commonly known as: DESYREL ?Stopped by: Virl Cagey, MD ?  ? ?  ? ?TAKE these medications   ? ?acetaminophen 325 MG tablet ?Commonly known as: TYLENOL ?Take 650 mg by mouth every 6 (six) hours as needed. ?  ?ALPRAZolam 0.5 MG tablet ?Commonly known as: Duanne Moron ?Take 1 tablet (0.5 mg total) by mouth every 8 (eight) hours as needed for anxiety. Do NOT take more than prescribed amount. ?  ?celecoxib 100 MG capsule ?Commonly known as: CELEBREX ?Take 100 mg by mouth 2 (two) times daily as needed. ?  ?clindamycin 300 MG capsule ?Commonly known as: CLEOCIN ?Take 300 mg by mouth every 6 (six) hours. ?  ?cloNIDine 0.1 MG tablet ?Commonly known as: CATAPRES ?Take 0.1 mg by mouth daily as needed (for high blood pressure). ?  ?gabapentin 100 MG capsule ?Commonly known as: NEURONTIN ?Take  100 mg by mouth 3 (three) times daily. ?  ?ibuprofen 200 MG tablet ?Commonly known as: ADVIL ?Take 200 mg by mouth every 6 (six) hours as needed. ?  ?mirtazapine 15 MG tablet ?Commonly known as: REMERON ?Take 15 mg by mouth at bedtime. ?  ?naproxen sodium 220 MG tablet ?Commonly known as: ALEVE ?Take 220 mg by mouth daily as needed. ?  ?oxyCODONE-acetaminophen 7.5-325 MG tablet ?Commonly known as: Percocet ?Take 1 tablet by mouth every 8 (eight) hours as needed for severe pain. Do NOT take more than prescribed. ?  ?Suboxone 12-3 MG Film ?Generic drug: Buprenorphine HCl-Naloxone HCl ?Place 0.5 strips under the tongue 4 (four) times daily. ?  ? ?  ? ? ? ?ROS:  ?A comprehensive review of systems was negative except for: Respiratory: positive for SOB ?Integument/breast: positive for breast tenderness and inflammatory breast cancer ?Musculoskeletal: positive for back pain ? ?Blood pressure 125/79, pulse 77, temperature 98.2 ?F  (36.8 ?C), temperature source Other (Comment), resp. rate 16, height '5\' 4"'$  (1.626 m), weight 129 lb (58.5 kg), SpO2 98 %. ?Physical Exam ? ?Results: ?CLINICAL DATA:  46 year old female with a palpable right breast mass ?which she has felt for at least the past 6 months. She states it is ?growing in size with recent right nipple flattening/inversion. ?Recent chest CT dated 09/11/2021 demonstrated a large mass involving ?the upper central right breast with associated nipple ?flattening/inversion and asymmetric skin thickening. ?  ?EXAM: ?DIGITAL DIAGNOSTIC BILATERAL MAMMOGRAM WITH TOMOSYNTHESIS AND CAD; ?ULTRASOUND RIGHT BREAST LIMITED ?  ?TECHNIQUE: ?Bilateral digital diagnostic mammography and breast tomosynthesis ?was performed. The images were evaluated with computer-aided ?detection.; Targeted ultrasound examination of the right breast was ?performed ?  ?COMPARISON:  Chest CT dated 09/11/2021. ?  ?ACR Breast Density Category c: The breast tissue is heterogeneously ?dense, which may obscure small masses. ?  ?FINDINGS: ?There is a large ill-defined mass measuring at least 5 cm ?predominantly involving the central upper right breast with ?associated nipple flattening/inversion and asymmetric skin ?thickening. No suspicious masses calcifications seen in the breast. ?  ?Physical examination reveals asymmetric masslike enlargement and ?erythema involving the right breast. The entire right breast is hard ?and extremely tender to palpation. ?  ?Targeted ultrasound of the right breast was performed demonstrating ?a large irregular mass involving the entire central right breast. ?This measures 4.9 x 2.4 x 4 cm at 12 o'clock 3 cm from nipple, ?however due to the large size the mass cannot be included in the ?field of view entirely and is likely much larger than what is ?measured. There is overlying skin thickening and subcutaneous edema. ?  ?At least 3 lymph nodes with borderline cortical thickening are ?present in the  right axilla/axillary tail. ?  ?IMPRESSION: ?1. Large palpable right breast mass involving the entire central ?right breast measuring at least 5 cm with associated nipple ?flattening and overlying skin thickening and generalized erythema ?and marked tenderness to palpation. Findings are concerning for ?inflammatory breast cancer. ?  ?2. At least 3 lymph nodes with borderline cortical thickening in the ?right axilla. ?  ?RECOMMENDATION: ?1. Recommend ultrasound-guided biopsy of the palpable right breast ?mass. ?  ?2. Recommend ultrasound-guided biopsy of 1 of the lymph nodes with ?borderline cortical thickening in the right axilla. ?  ?I have discussed the findings and recommendations with the patient. ?If applicable, a reminder letter will be sent to the patient ?regarding the next appointment. ?  ?BI-RADS CATEGORY  5: Highly suggestive of malignancy. ?  ?  ?Electronically  Signed ?  By: Everlean Alstrom M.D. ?  On: 10/01/2021 16:27 ?  ?ADDENDUM REPORT: 10/11/2021 12:38 ?  ?ADDENDUM: ?PATHOLOGY revealed: Site A. LYMPH NODE AXILLARY, RIGHT, BIOPSY: - ?Mammary carcinoma, poorly differentiated (see comment). ?  ?Pathology results are CONCORDANT with imaging findings, per Dr. ?Kristopher Oppenheim. ?  ?PATHOLOGY revealed: Site B. BREAST, MASS, RIGHT, BIOPSY: - Mammary ?carcinoma, poorly differentiated. ?  ?Comment: There is invasive mammary carcinoma. Tubular formation: 3, ?Nuclear pleomorphism: 2, Mitotic figure: 3. Overall grade: 8/9. ?Regarding part A, there is predominance of invasive carcinoma with ?scant lymphocytes. Regarding part B, there is a small fragment of ?invasive carcinoma. ?  ?Pathology results are CONCORDANT with imaging findings, per Dr. ?Kristopher Oppenheim. ?  ?Pathology results and recommendations below were discussed with ?patient by telephone on 10/10/2021. Patient reported biopsy site ?within normal limits with slight tenderness at the site. Post biopsy ?care instructions were reviewed, questions were answered  and my ?direct phone number was provided to patient. Patient was instructed ?to call [facility] Mammography Department if ?any concerns or questions arise related to the biopsy. ?  ?

## 2021-11-12 NOTE — Patient Instructions (Signed)
Implanted Port Insertion ?Implanted port insertion is a procedure to put in a port and catheter. The port is a device with an injectable disc that can be accessed by your health care provider. The port is connected to a vein in the chest or neck by a small, thin tube (catheter). There are different types of ports. The implanted port may be used as a long-term IV access for: ?Medicines, such as chemotherapy. ?Fluids. ?Liquid nutrition, such as total parenteral nutrition (TPN). ?When you have a port, your health care provider can choose to use the port instead of veins in your arms for these procedures. ?Tell a health care provider about: ?Any allergies you have. ?All medicines you are taking, especially blood thinners, as well as any vitamins, herbs, eye drops, creams, over-the-counter medicines, and steroids. ?Any problems you or family members have had with anesthetic medicines. ?Any bleeding problems you have. ?Any surgeries you have had. ?Any medical conditions you have or have had, including diabetes or kidney problems. ?Whether you are pregnant or may be pregnant. ?What are the risks? ?Generally, this is a safe procedure. However, problems may occur, including: ?Allergic reactions to medicines or dyes. ?Damage to other structures or organs. ?Infection. ?Damage to the blood vessel, bruising, or bleeding at the puncture site. ?Blood clot. ?Breakdown of the skin over the port. ?A collection of air in the chest that can cause one of the lungs to collapse (pneumothorax). This is rare. ?What happens before the procedure? ?Medicines ?Ask your health care provider about: ?Changing or stopping your regular medicines. This is especially important if you are taking diabetes medicines or blood thinners. ?Taking medicines such as aspirin and ibuprofen. These medicines can thin your blood. Do not take these medicines unless your health care provider tells you to take them. ?Taking over-the-counter medicines, vitamins, herbs,  and supplements. ?General instructions ?If you will be going home right after the procedure, plan to have a responsible adult: ?Take you home from the hospital or clinic. You will not be allowed to drive. ?Care for you for the time you are told. ?You may have blood tests. ?Do not use any products that contain nicotine or tobacco for at least 4 weeks before the procedure. These products include cigarettes, chewing tobacco, and vaping devices, such as e-cigarettes. If you need help quitting, ask your health care provider. ?Ask your health care provider what steps will be taken to help prevent infection. These may include: ?Removing hair at the surgery site. ?Washing skin with a germ-killing soap. ?Taking antibiotic medicine. ?What happens during the procedure? ? ?An IV will be inserted into one of your veins. ?You will be given one or more of the following: ?A medicine to help you relax (sedative). ?A medicine to numb the area (local anesthetic). ?Two small incisions will be made to insert the port. ?One smaller incision will be made in your neck to get access to the vein where the catheter will lie. ?The other incision will be made in the upper chest. This is where the port will lie. ?The procedure may be done using continuous X-ray (fluoroscopy) or other imaging tools for guidance. ?The port and catheter will be placed. There may be a small, raised area where the port is placed. ?The port will be flushed with a saline solution, which is made of salt and water, and blood will be drawn to make sure that the port is working correctly. ?The incisions will be closed. ?Bandages (dressings) may be placed over the incisions. ?  The procedure may vary among health care providers and hospitals. ?What happens after the procedure? ?Your blood pressure, heart rate, breathing rate, and blood oxygen level will be monitored until you leave the hospital or clinic. ?If you were given a sedative during the procedure, it can affect you  for several hours. Do not drive or operate machinery until your health care provider says that it is safe. ?You will be given a manufacturer's information card for the type of port that you have. Keep this with you. ?Your port will need to be flushed and checked as told by your health care provider, usually every few weeks. ?A chest X-ray will be done to: ?Check the placement of the port. ?Make sure there is no injury to your lung. ?Summary ?Implanted port insertion is a procedure to put in a port and catheter. ?The implanted port is used as a long-term IV access. ?The port will need to be flushed and checked as told by your health care provider, usually every few weeks. ?Keep your manufacturer's information card with you at all times. ?This information is not intended to replace advice given to you by your health care provider. Make sure you discuss any questions you have with your health care provider. ?Document Revised: 02/05/2021 Document Reviewed: 02/05/2021 ?Elsevier Patient Education ? 2022 [employer] ? ?

## 2021-11-14 ENCOUNTER — Ambulatory Visit (HOSPITAL_COMMUNITY): Admission: RE | Admit: 2021-11-14 | Payer: Medicaid Other | Source: Ambulatory Visit

## 2021-11-14 NOTE — H&P (Signed)
[facility] History and Physical ? ?Reason for Referral: Port placement, right breast cancer  ?Referring Physician: Dr. Delton Coombes  ? ?Chief Complaint   ?New Patient (Initial Visit) ?  ? ? ?Sara [name] is a 46 y.o. female.  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 Elonda Husky attention in October.  She noticed a mass in July 2022 and had this grew from the size of a quarter to an orange. She developed redness and inverted nipple prior to her biopsy in January. She is getting her neoadjuvant therapy and radiation and needs her port placed ASAP.  She says that her right breast is very painful. She has a history of Chronic Pain managed by Dr. Valetta Close in [place]. ? ?She had menarche at age 28, and her first pregnancy at age 50. She is G2P2.  She has a history of any family breast cancer in her paternal grandmother.  She has not had any chest radiation. ? ? ?Past Medical History:  ?Diagnosis Date  ? Anxiety   ? Back pain   ? ? ?Past Surgical History:  ?Procedure Laterality Date  ? CHOLECYSTECTOMY    ? ORTHOPEDIC SURGERY    ? ? ?Family History  ?Problem Relation Age of Onset  ? Cancer Paternal Grandfather   ? Cancer Paternal Grandmother   ?     lung  ? Heart disease Brother   ? Asthma Daughter   ? Asthma Daughter   ? ? ?Social History  ? ?Tobacco Use  ? Smoking status: Former  ?  Packs/day: 1.00  ?  Years: 5.00  ?  Pack years: 5.00  ?  Types: Cigarettes  ?  Quit date: 11/08/2021  ?  Years since quitting: 0.0  ? Smokeless tobacco: Never  ?Vaping Use  ? Vaping Use: Never used  ?Substance Use Topics  ? Alcohol use: No  ?  Comment: occasionally  ? Drug use: No  ? ? ?Medications: I have reviewed the patient's current medications. ?Allergies as of 11/12/2021   ? ?   Reactions  ? Hydrocodone Nausea And Vomiting  ? Sulfa Antibiotics Nausea And Vomiting  ? Morphine And Related Rash  ? ?  ? ?  ?Medication List  ?  ? ?  ? Accurate as of November 12, 2021 11:56  AM. If you have any questions, ask your nurse or doctor.  ?  ?  ? ?  ? ?STOP taking these medications   ? ?hydrOXYzine 25 MG tablet ?Commonly known as: ATARAX ?Stopped by: Virl Cagey, MD ?  ?traZODone 100 MG tablet ?Commonly known as: DESYREL ?Stopped by: Virl Cagey, MD ?  ? ?  ? ?TAKE these medications   ? ?acetaminophen 325 MG tablet ?Commonly known as: TYLENOL ?Take 650 mg by mouth every 6 (six) hours as needed. ?  ?ALPRAZolam 0.5 MG tablet ?Commonly known as: Duanne Moron ?Take 1 tablet (0.5 mg total) by mouth every 8 (eight) hours as needed for anxiety. Do NOT take more than prescribed amount. ?  ?celecoxib 100 MG capsule ?Commonly known as: CELEBREX ?Take 100 mg by mouth 2 (two) times daily as needed. ?  ?clindamycin 300 MG capsule ?Commonly known as: CLEOCIN ?Take 300 mg by mouth every 6 (six) hours. ?  ?cloNIDine 0.1 MG tablet ?Commonly known as: CATAPRES ?Take 0.1 mg by mouth daily as needed (for high blood pressure). ?  ?gabapentin 100 MG capsule ?Commonly known as: NEURONTIN ?Take  100 mg by mouth 3 (three) times daily. ?  ?ibuprofen 200 MG tablet ?Commonly known as: ADVIL ?Take 200 mg by mouth every 6 (six) hours as needed. ?  ?mirtazapine 15 MG tablet ?Commonly known as: REMERON ?Take 15 mg by mouth at bedtime. ?  ?naproxen sodium 220 MG tablet ?Commonly known as: ALEVE ?Take 220 mg by mouth daily as needed. ?  ?oxyCODONE-acetaminophen 7.5-325 MG tablet ?Commonly known as: Percocet ?Take 1 tablet by mouth every 8 (eight) hours as needed for severe pain. Do NOT take more than prescribed. ?  ?Suboxone 12-3 MG Film ?Generic drug: Buprenorphine HCl-Naloxone HCl ?Place 0.5 strips under the tongue 4 (four) times daily. ?  ? ?  ? ? ? ?ROS:  ?A comprehensive review of systems was negative except for: Respiratory: positive for SOB ?Integument/breast: positive for breast tenderness and inflammatory breast cancer ?Musculoskeletal: positive for back pain ? ?Blood pressure 125/79, pulse 77, temperature 98.2 ?F  (36.8 ?C), temperature source Other (Comment), resp. rate 16, height '5\' 4"'$  (1.626 m), weight 129 lb (58.5 kg), SpO2 98 %. ?Physical Exam ? ?Results: ?CLINICAL DATA:  46 year old female with a palpable right breast mass ?which she has felt for at least the past 6 months. She states it is ?growing in size with recent right nipple flattening/inversion. ?Recent chest CT dated 09/11/2021 demonstrated a large mass involving ?the upper central right breast with associated nipple ?flattening/inversion and asymmetric skin thickening. ?  ?EXAM: ?DIGITAL DIAGNOSTIC BILATERAL MAMMOGRAM WITH TOMOSYNTHESIS AND CAD; ?ULTRASOUND RIGHT BREAST LIMITED ?  ?TECHNIQUE: ?Bilateral digital diagnostic mammography and breast tomosynthesis ?was performed. The images were evaluated with computer-aided ?detection.; Targeted ultrasound examination of the right breast was ?performed ?  ?COMPARISON:  Chest CT dated 09/11/2021. ?  ?ACR Breast Density Category c: The breast tissue is heterogeneously ?dense, which may obscure small masses. ?  ?FINDINGS: ?There is a large ill-defined mass measuring at least 5 cm ?predominantly involving the central upper right breast with ?associated nipple flattening/inversion and asymmetric skin ?thickening. No suspicious masses calcifications seen in the breast. ?  ?Physical examination reveals asymmetric masslike enlargement and ?erythema involving the right breast. The entire right breast is hard ?and extremely tender to palpation. ?  ?Targeted ultrasound of the right breast was performed demonstrating ?a large irregular mass involving the entire central right breast. ?This measures 4.9 x 2.4 x 4 cm at 12 o'clock 3 cm from nipple, ?however due to the large size the mass cannot be included in the ?field of view entirely and is likely much larger than what is ?measured. There is overlying skin thickening and subcutaneous edema. ?  ?At least 3 lymph nodes with borderline cortical thickening are ?present in the  right axilla/axillary tail. ?  ?IMPRESSION: ?1. Large palpable right breast mass involving the entire central ?right breast measuring at least 5 cm with associated nipple ?flattening and overlying skin thickening and generalized erythema ?and marked tenderness to palpation. Findings are concerning for ?inflammatory breast cancer. ?  ?2. At least 3 lymph nodes with borderline cortical thickening in the ?right axilla. ?  ?RECOMMENDATION: ?1. Recommend ultrasound-guided biopsy of the palpable right breast ?mass. ?  ?2. Recommend ultrasound-guided biopsy of 1 of the lymph nodes with ?borderline cortical thickening in the right axilla. ?  ?I have discussed the findings and recommendations with the patient. ?If applicable, a reminder letter will be sent to the patient ?regarding the next appointment. ?  ?BI-RADS CATEGORY  5: Highly suggestive of malignancy. ?  ?  ?Electronically  Signed ?  By: Everlean Alstrom M.D. ?  On: 10/01/2021 16:27 ?  ?ADDENDUM REPORT: 10/11/2021 12:38 ?  ?ADDENDUM: ?PATHOLOGY revealed: Site A. LYMPH NODE AXILLARY, RIGHT, BIOPSY: - ?Mammary carcinoma, poorly differentiated (see comment). ?  ?Pathology results are CONCORDANT with imaging findings, per Dr. ?Kristopher Oppenheim. ?  ?PATHOLOGY revealed: Site B. BREAST, MASS, RIGHT, BIOPSY: - Mammary ?carcinoma, poorly differentiated. ?  ?Comment: There is invasive mammary carcinoma. Tubular formation: 3, ?Nuclear pleomorphism: 2, Mitotic figure: 3. Overall grade: 8/9. ?Regarding part A, there is predominance of invasive carcinoma with ?scant lymphocytes. Regarding part B, there is a small fragment of ?invasive carcinoma. ?  ?Pathology results are CONCORDANT with imaging findings, per Dr. ?Kristopher Oppenheim. ?  ?Pathology results and recommendations below were discussed with ?patient by telephone on 10/10/2021. Patient reported biopsy site ?within normal limits with slight tenderness at the site. Post biopsy ?care instructions were reviewed, questions were answered  and my ?direct phone number was provided to patient. Patient was instructed ?to call [facility] Mammography Department if ?any concerns or questions arise related to the biopsy. ?  ?

## 2021-11-15 ENCOUNTER — Other Ambulatory Visit: Payer: Self-pay

## 2021-11-15 ENCOUNTER — Encounter (HOSPITAL_COMMUNITY)
Admission: RE | Admit: 2021-11-15 | Discharge: 2021-11-15 | Disposition: A | Discharge status: Home / Self Care | Payer: Medicaid Other | Source: Ambulatory Visit | Attending: General Surgery | Admitting: General Surgery

## 2021-11-15 ENCOUNTER — Encounter (HOSPITAL_COMMUNITY): Payer: Self-pay

## 2021-11-15 VITALS — Ht 64.0 in | Wt 129.0 lb

## 2021-11-15 DIAGNOSIS — Z01818 Encounter for other preprocedural examination: Secondary | ICD-10-CM

## 2021-11-18 ENCOUNTER — Ambulatory Visit (HOSPITAL_BASED_OUTPATIENT_CLINIC_OR_DEPARTMENT_OTHER): Payer: Medicaid Other | Admitting: Anesthesiology

## 2021-11-18 ENCOUNTER — Ambulatory Visit (HOSPITAL_COMMUNITY)
Admission: RE | Admit: 2021-11-18 | Discharge: 2021-11-18 | Disposition: A | Discharge status: Home / Self Care | Payer: Medicaid Other | Source: Ambulatory Visit | Attending: General Surgery | Admitting: General Surgery

## 2021-11-18 ENCOUNTER — Encounter (HOSPITAL_COMMUNITY)
Admission: RE | Disposition: A | Discharge status: Home / Self Care | Payer: Self-pay | Source: Ambulatory Visit | Attending: General Surgery

## 2021-11-18 ENCOUNTER — Ambulatory Visit (HOSPITAL_COMMUNITY): Payer: Medicaid Other

## 2021-11-18 ENCOUNTER — Ambulatory Visit (HOSPITAL_COMMUNITY): Payer: Medicaid Other | Admitting: Anesthesiology

## 2021-11-18 ENCOUNTER — Other Ambulatory Visit: Payer: Self-pay

## 2021-11-18 DIAGNOSIS — F419 Anxiety disorder, unspecified: Secondary | ICD-10-CM | POA: Insufficient documentation

## 2021-11-18 DIAGNOSIS — C50911 Malignant neoplasm of unspecified site of right female breast: Secondary | ICD-10-CM | POA: Insufficient documentation

## 2021-11-18 DIAGNOSIS — Z87891 Personal history of nicotine dependence: Secondary | ICD-10-CM | POA: Diagnosis not present

## 2021-11-18 DIAGNOSIS — Z803 Family history of malignant neoplasm of breast: Secondary | ICD-10-CM | POA: Insufficient documentation

## 2021-11-18 DIAGNOSIS — Z01818 Encounter for other preprocedural examination: Secondary | ICD-10-CM

## 2021-11-18 HISTORY — PX: PORTACATH PLACEMENT: SHX2246

## 2021-11-18 LAB — PREGNANCY, URINE: Preg Test, Ur: NEGATIVE

## 2021-11-18 SURGERY — INSERTION, TUNNELED CENTRAL VENOUS DEVICE, WITH PORT
Anesthesia: General | Site: Chest | Laterality: Left

## 2021-11-18 MED ORDER — MIDAZOLAM HCL 2 MG/2ML IJ SOLN
INTRAMUSCULAR | Status: AC
  Filled 2021-11-18: qty 2

## 2021-11-18 MED ORDER — ONDANSETRON HCL 4 MG/2ML IJ SOLN: 4.0000 mg | Freq: Once | INTRAMUSCULAR | Status: DC | PRN

## 2021-11-18 MED ORDER — HEPARIN SOD (PORK) LOCK FLUSH 100 UNIT/ML IV SOLN
INTRAVENOUS | Status: DC | PRN
  Administered 2021-11-18: 500 [IU] via INTRAVENOUS

## 2021-11-18 MED ORDER — FENTANYL CITRATE (PF) 100 MCG/2ML IJ SOLN
INTRAMUSCULAR | Status: AC
  Filled 2021-11-18: qty 2

## 2021-11-18 MED ORDER — PROPOFOL 10 MG/ML IV BOLUS
INTRAVENOUS | Status: AC
  Filled 2021-11-18: qty 20

## 2021-11-18 MED ORDER — CHLORHEXIDINE GLUCONATE 0.12 % MT SOLN
15.0000 mL | Freq: Once | OROMUCOSAL | Status: AC
  Administered 2021-11-18: 15 mL via OROMUCOSAL
  Filled 2021-11-18: qty 15

## 2021-11-18 MED ORDER — ONDANSETRON HCL 4 MG/2ML IJ SOLN
INTRAMUSCULAR | Status: AC
  Filled 2021-11-18: qty 2

## 2021-11-18 MED ORDER — CHLORHEXIDINE GLUCONATE CLOTH 2 % EX PADS: 6.0000 | MEDICATED_PAD | Freq: Once | CUTANEOUS | Status: DC

## 2021-11-18 MED ORDER — PROPOFOL 500 MG/50ML IV EMUL
INTRAVENOUS | Status: DC | PRN
  Administered 2021-11-18: 150 ug/kg/min via INTRAVENOUS

## 2021-11-18 MED ORDER — DEXAMETHASONE SODIUM PHOSPHATE 10 MG/ML IJ SOLN
INTRAMUSCULAR | Status: DC | PRN
  Administered 2021-11-18: 5 mg via INTRAVENOUS

## 2021-11-18 MED ORDER — LIDOCAINE HCL (PF) 1 % IJ SOLN
INTRAMUSCULAR | Status: DC | PRN
  Administered 2021-11-18: 10 mL

## 2021-11-18 MED ORDER — HEPARIN SOD (PORK) LOCK FLUSH 100 UNIT/ML IV SOLN
INTRAVENOUS | Status: AC
  Filled 2021-11-18: qty 5

## 2021-11-18 MED ORDER — CEFAZOLIN SODIUM-DEXTROSE 2-4 GM/100ML-% IV SOLN
2.0000 g | INTRAVENOUS | Status: AC
  Administered 2021-11-18: 2 g via INTRAVENOUS
  Filled 2021-11-18: qty 100

## 2021-11-18 MED ORDER — FENTANYL CITRATE (PF) 100 MCG/2ML IJ SOLN
INTRAMUSCULAR | Status: DC | PRN
  Administered 2021-11-18 (×2): 50 ug via INTRAVENOUS

## 2021-11-18 MED ORDER — ONDANSETRON HCL 4 MG/2ML IJ SOLN
INTRAMUSCULAR | Status: DC | PRN
  Administered 2021-11-18: 4 mg via INTRAVENOUS

## 2021-11-18 MED ORDER — MEPERIDINE HCL 50 MG/ML IJ SOLN: 6.2500 mg | INTRAMUSCULAR | Status: DC | PRN

## 2021-11-18 MED ORDER — SODIUM CHLORIDE (PF) 0.9 % IJ SOLN
INTRAMUSCULAR | Status: DC | PRN
  Administered 2021-11-18: 5 mL via INTRAVENOUS

## 2021-11-18 MED ORDER — MIDAZOLAM HCL 5 MG/5ML IJ SOLN
INTRAMUSCULAR | Status: DC | PRN
Start: 2021-11-18 — End: 2021-11-18
  Administered 2021-11-18: 2 mg via INTRAVENOUS

## 2021-11-18 MED ORDER — LIDOCAINE HCL (PF) 2 % IJ SOLN
INTRAMUSCULAR | Status: AC
  Filled 2021-11-18: qty 5

## 2021-11-18 MED ORDER — FENTANYL CITRATE PF 50 MCG/ML IJ SOSY: 25.0000 ug | PREFILLED_SYRINGE | INTRAMUSCULAR | Status: DC | PRN

## 2021-11-18 MED ORDER — ORAL CARE MOUTH RINSE: 15.0000 mL | Freq: Once | OROMUCOSAL | Status: AC

## 2021-11-18 MED ORDER — LIDOCAINE HCL (CARDIAC) PF 50 MG/5ML IV SOSY
PREFILLED_SYRINGE | INTRAVENOUS | Status: DC | PRN
Start: 2021-11-18 — End: 2021-11-18
  Administered 2021-11-18: 100 mg via INTRAVENOUS

## 2021-11-18 MED ORDER — MIDAZOLAM HCL 2 MG/2ML IJ SOLN
2.0000 mg | Freq: Once | INTRAMUSCULAR | Status: AC
  Administered 2021-11-18: 2 mg via INTRAVENOUS

## 2021-11-18 MED ORDER — LIDOCAINE HCL (PF) 1 % IJ SOLN
INTRAMUSCULAR | Status: AC
  Filled 2021-11-18: qty 30

## 2021-11-18 MED ORDER — PROPOFOL 10 MG/ML IV BOLUS
INTRAVENOUS | Status: DC | PRN
  Administered 2021-11-18: 20 mg via INTRAVENOUS
  Administered 2021-11-18: 30 mg via INTRAVENOUS
  Administered 2021-11-18: 50 mg via INTRAVENOUS
  Administered 2021-11-18: 100 mg via INTRAVENOUS

## 2021-11-18 MED ORDER — LACTATED RINGERS IV SOLN: INTRAVENOUS | Status: DC

## 2021-11-18 SURGICAL SUPPLY — 37 items
ADH SKN CLS APL DERMABOND .7 (GAUZE/BANDAGES/DRESSINGS) ×1
APL PRP STRL LF ISPRP CHG 10.5 (MISCELLANEOUS) ×1
APPLICATOR CHLORAPREP 10.5 ORG (MISCELLANEOUS) ×2 IMPLANT
APPLIER CLIP 9.375 SM OPEN (CLIP)
APR CLP SM 9.3 20 MLT OPN (CLIP)
BAG DECANTER FOR FLEXI CONT (MISCELLANEOUS) ×2 IMPLANT
CLIP APPLIE 9.375 SM OPEN (CLIP) IMPLANT
CLOTH BEACON ORANGE TIMEOUT ST (SAFETY) ×2 IMPLANT
COVER LIGHT HANDLE STERIS (MISCELLANEOUS) ×4 IMPLANT
COVER PROBE U/S 5X48 (MISCELLANEOUS) ×1 IMPLANT
DECANTER SPIKE VIAL GLASS SM (MISCELLANEOUS) ×2 IMPLANT
DERMABOND ADVANCED (GAUZE/BANDAGES/DRESSINGS) ×1
DERMABOND ADVANCED .7 DNX12 (GAUZE/BANDAGES/DRESSINGS) ×1 IMPLANT
DRAPE C-ARM FOLDED MOBILE STRL (DRAPES) ×2 IMPLANT
ELECT REM PT RETURN 9FT ADLT (ELECTROSURGICAL) ×2
ELECTRODE REM PT RTRN 9FT ADLT (ELECTROSURGICAL) ×1 IMPLANT
GLOVE SURG ENC MOIS LTX SZ6.5 (GLOVE) ×2 IMPLANT
GLOVE SURG UNDER POLY LF SZ7 (GLOVE) ×4 IMPLANT
GOWN STRL REUS W/TWL LRG LVL3 (GOWN DISPOSABLE) ×4 IMPLANT
IV NS 500ML (IV SOLUTION) ×2
IV NS 500ML BAXH (IV SOLUTION) ×1 IMPLANT
KIT PORT POWER 8FR ISP MRI (Port) ×2 IMPLANT
KIT TURNOVER KIT A (KITS) ×2 IMPLANT
MANIFOLD NEPTUNE II (INSTRUMENTS) ×2 IMPLANT
NDL HYPO 25X1 1.5 SAFETY (NEEDLE) ×1 IMPLANT
NEEDLE HYPO 25X1 1.5 SAFETY (NEEDLE) ×2 IMPLANT
PACK MINOR (CUSTOM PROCEDURE TRAY) ×2 IMPLANT
PAD ABD 5X9 TENDERSORB (GAUZE/BANDAGES/DRESSINGS) ×3 IMPLANT
PAD ABD 8X10 STRL (GAUZE/BANDAGES/DRESSINGS) ×2 IMPLANT
PAD ARMBOARD 7.5X6 YLW CONV (MISCELLANEOUS) ×2 IMPLANT
SET BASIN LINEN APH (SET/KITS/TRAYS/PACK) ×2 IMPLANT
SUT MNCRL AB 4-0 PS2 18 (SUTURE) ×2 IMPLANT
SUT PROLENE 2 0 SH 30 (SUTURE) ×2 IMPLANT
SUT VIC AB 3-0 SH 27 (SUTURE) ×2
SUT VIC AB 3-0 SH 27X BRD (SUTURE) ×1 IMPLANT
SYR 10ML LL (SYRINGE) ×4 IMPLANT
SYR CONTROL 10ML LL (SYRINGE) ×2 IMPLANT

## 2021-11-18 NOTE — Discharge Instructions (Signed)
Keep area clean and dry. You can take a shower in 24 hours. Do not submerge in water until healed, 4 weeks.   ?Take tylenol and ibuprofen for pain control and take your home narcotics for severe pain. ? ?

## 2021-11-18 NOTE — Progress Notes (Signed)
[facility] ? ?CXR with port in good position and no PTX. ? ?Curlene Labrum, MD ?[facility] ?[place], [place] [address] ?[phone] (office) ? ?

## 2021-11-18 NOTE — Anesthesia Preprocedure Evaluation (Signed)
Anesthesia Evaluation  ?Patient identified by MRN, date of birth, ID band ?Patient awake ? ? ? ?Reviewed: ?Allergy & Precautions, NPO status , Patient's Chart, lab work & pertinent test results ? ?Airway ?Mallampati: II ? ?TM Distance: >3 FB ?Neck ROM: Full ? ? ? Dental ? ?(+) Dental Advisory Given, Chipped, Missing ?  ?Pulmonary ?neg pulmonary ROS, former smoker,  ?  ?Pulmonary exam normal ?breath sounds clear to auscultation ? ? ? ? ? ? Cardiovascular ?negative cardio ROS ?Normal cardiovascular exam ?Rhythm:Regular Rate:Normal ? ? ?  ?Neuro/Psych ?PSYCHIATRIC DISORDERS Anxiety negative neurological ROS ?   ? GI/Hepatic ?negative GI ROS, Neg liver ROS,   ?Endo/Other  ?negative endocrine ROS ? Renal/GU ?negative Renal ROS  ?negative genitourinary ?  ?Musculoskeletal ?negative musculoskeletal ROS ?(+)  ? Abdominal ?  ?Peds ?negative pediatric ROS ?(+)  Hematology ?negative hematology ROS ?(+)   ?Anesthesia Other Findings ?Right breast cancer ?Back pain ? Reproductive/Obstetrics ?negative OB ROS ? ?  ? ? ? ? ? ? ? ? ? ? ? ? ? ?  ?  ? ? ? ? ? ? ? ? ?Anesthesia Physical ?Anesthesia Plan ? ?ASA: 2 ? ?Anesthesia Plan: General  ? ?Post-op Pain Management:   ? ?Induction: Intravenous ? ?PONV Risk Score and Plan: 4 or greater and Ondansetron and Dexamethasone ? ?Airway Management Planned: Nasal Cannula and Natural Airway ? ?Additional Equipment:  ? ?Intra-op Plan:  ? ?Post-operative Plan:  ? ?Informed Consent: I have reviewed the patients History and Physical, chart, labs and discussed the procedure including the risks, benefits and alternatives for the proposed anesthesia with the patient or authorized representative who has indicated his/her understanding and acceptance.  ? ? ? ?Dental advisory given ? ?Plan Discussed with: CRNA and Surgeon ? ?Anesthesia Plan Comments:   ? ? ? ? ? ? ?Anesthesia Quick Evaluation ? ?

## 2021-11-18 NOTE — Op Note (Signed)
Operative Note ?11/18/21 ?  ?Preoperative Diagnosis: Right breast cancer  ?  ?Postoperative Diagnosis: Same ?  ?Procedure(s) Performed: Port-A-Cath placement, left subclavian vein ?  ?Surgeon: Lanell Matar. Constance Haw, MD ?  ?Assistants: No qualified resident was available ?  ?Anesthesia: Monitored anesthesia care ?  ?Anesthesiologist: Dr. Charna Elizabeth ?  ?Specimens: None ?  ?Estimated Blood Loss: Minimal  ? ?Fluoroscopy time: 7 seconds ?  ?Blood Replacement: None  ?  ?Complications: None  ?  ?Operative Findings: Normal anatomy  ? ?Indications: Sara [name] is a 46 yo with a Her2+ metastatic breast cancer that needs a port placed. We discussed risk of bleeding, infection, pneumothorax, injury to vessels or malfunction.  ? ?Procedure: The patient was brought into the operating room and monitored anesthesia care was induced.  One percent lidocaine was used for local anesthesia.   The left chest and neck was prepped and draped in the usual sterile fashion.  Preoperative antibiotics were given.  An incision was made below the left clavicle. A subcutaneous pocket was formed. The needles advanced into the subclavian vein using the Seldinger technique without difficulty. A guidewire was then advanced into the right atrium under fluoroscopic guidance.  Ectopia was not noted. An introducer and peel-away sheath were placed over the guidewire. The catheter was then inserted through the peel-away sheath and the peel-away sheath was removed.  A spot film was performed to confirm the position. The catheter was then attached to the port and the port placed in subcutaneous pocket. Adequate positioning was confirmed by fluoroscopy. Hemostasis was confirmed, and the port was secured with 2-0 prolene sutures.  Good backflow of blood was noted on aspiration of the port. The port was flushed with heparin flush. Subcutaneous layer was reapproximated using a 3-0 Vicryl interrupted suture. The skin was closed using a 4-0 Vicryl subcuticular suture.  Dermabond was applied. ?   ?All tape and needle counts were correct at the end of the procedure. The patient was transferred to PACU in stable condition. A chest x-ray will be performed at that time. ? ?Curlene Labrum, MD ?[facility] ?[place], [place] [address] ?[phone] (office) ? ? ? ? ?

## 2021-11-18 NOTE — Anesthesia Procedure Notes (Signed)
Date/Time: 11/18/2021 10:22 AM ?Performed by: Vista Deck, CRNA ?Pre-anesthesia Checklist: Patient identified, Emergency Drugs available, Suction available, Timeout performed and Patient being monitored ?Patient Re-evaluated:Patient Re-evaluated prior to induction ?Oxygen Delivery Method: Nasal Cannula ? ? ? ? ?

## 2021-11-18 NOTE — Transfer of Care (Addendum)
Immediate Anesthesia Transfer of Care Note ? ?Patient: Sara [name] ? ?Procedure(s) Performed: INSERTION PORT-A-CATH (Left: Chest) ? ?Patient Location: PACU ? ?Anesthesia Type:General ? ?Level of Consciousness: awake and patient cooperative ? ?Airway & Oxygen Therapy: Patient Spontanous Breathing and Patient connected to nasal cannula oxygen ? ?Post-op Assessment: Report given to RN and Post -op Vital signs reviewed and stable ? ?Post vital signs: Reviewed and stable ? ?Last Vitals:  ?Vitals Value Taken Time  ?BP 100/68 11/18/21 1105  ?Temp 98.3 1111  ?Pulse 85 11/18/21 1109  ?Resp 26 11/18/21 1109  ?SpO2 100 % 11/18/21 1109  ?Vitals shown include unvalidated device data. ? ?Last Pain:  ?Vitals:  ? 11/18/21 0938  ?PainSc: 0-No pain  ?   ? ?  ? ?Complications: No notable events documented. ?

## 2021-11-18 NOTE — Anesthesia Postprocedure Evaluation (Signed)
Anesthesia Post Note ? ?Patient: Sara [name] ? ?Procedure(s) Performed: INSERTION PORT-A-CATH (Left: Chest) ? ?Patient location during evaluation: Phase II ?Anesthesia Type: General ?Level of consciousness: awake and alert and oriented ?Pain management: pain level controlled ?Vital Signs Assessment: post-procedure vital signs reviewed and stable ?Respiratory status: spontaneous breathing, nonlabored ventilation and respiratory function stable ?Cardiovascular status: blood pressure returned to baseline and stable ?Postop Assessment: no apparent nausea or vomiting ?Anesthetic complications: no ? ? ?No notable events documented. ? ? ?Last Vitals:  ?Vitals:  ? 11/18/21 1130 11/18/21 1145  ?BP: 123/81 118/81  ?Pulse: 69 66  ?Resp: 14 (!) 21  ?Temp:    ?SpO2: 100% 99%  ?  ?Last Pain:  ?Vitals:  ? 11/18/21 1203  ?PainSc: 3   ? ? ?  ?  ?  ?  ?  ?  ? ?Asriel Westrup C Sequoyah Counterman ? ? ? ? ?

## 2021-11-18 NOTE — Progress Notes (Addendum)
[facility] ? ?Updated partner. CXR ordered. Will see back in a few months after her neoadjuvant therapy.  ? ?Chronic pain on suboxone and percocet, with Dr. Valetta Close and Dr. Delton Coombes. Encouraged her to continue to discuss plans for pain medication with these providers.  ? ?Curlene Labrum, MD ?[facility] ?[place], [place] [address] ?[phone] (office) ? ?

## 2021-11-18 NOTE — Interval H&P Note (Signed)
History and Physical Interval Note: ? ?11/18/2021 ?10:11 AM ? ?Sara [name]  has presented today for surgery, with the diagnosis of Right breast cancer.  The various methods of treatment have been discussed with the patient and family. After consideration of risks, benefits and other options for treatment, the patient has consented to  Procedure(s): ?INSERTION PORT-A-CATH (Left) as a surgical intervention.  The patient's history has been reviewed, patient examined, no change in status, stable for surgery.  I have reviewed the patient's chart and labs.  Questions were answered to the patient's satisfaction.   ? ? ?Virl Cagey ? ? ?

## 2021-11-20 ENCOUNTER — Inpatient Hospital Stay (HOSPITAL_COMMUNITY): Payer: Medicaid Other | Attending: Hematology | Admitting: Hematology

## 2021-11-20 ENCOUNTER — Encounter (HOSPITAL_COMMUNITY): Payer: Self-pay | Admitting: General Surgery

## 2021-11-20 ENCOUNTER — Inpatient Hospital Stay (HOSPITAL_COMMUNITY): Payer: Medicaid Other | Admitting: Hematology

## 2021-11-20 VITALS — BP 166/88 | HR 99 | Temp 97.9°F | Resp 18 | Ht 64.96 in | Wt 129.8 lb

## 2021-11-20 DIAGNOSIS — Z5189 Encounter for other specified aftercare: Secondary | ICD-10-CM | POA: Diagnosis not present

## 2021-11-20 DIAGNOSIS — C787 Secondary malignant neoplasm of liver and intrahepatic bile duct: Secondary | ICD-10-CM | POA: Insufficient documentation

## 2021-11-20 DIAGNOSIS — Z79899 Other long term (current) drug therapy: Secondary | ICD-10-CM | POA: Diagnosis not present

## 2021-11-20 DIAGNOSIS — C50111 Malignant neoplasm of central portion of right female breast: Secondary | ICD-10-CM | POA: Insufficient documentation

## 2021-11-20 DIAGNOSIS — Z170 Estrogen receptor positive status [ER+]: Secondary | ICD-10-CM | POA: Insufficient documentation

## 2021-11-20 DIAGNOSIS — Z5112 Encounter for antineoplastic immunotherapy: Secondary | ICD-10-CM | POA: Insufficient documentation

## 2021-11-20 DIAGNOSIS — C7951 Secondary malignant neoplasm of bone: Secondary | ICD-10-CM | POA: Insufficient documentation

## 2021-11-20 DIAGNOSIS — C773 Secondary and unspecified malignant neoplasm of axilla and upper limb lymph nodes: Secondary | ICD-10-CM | POA: Insufficient documentation

## 2021-11-20 LAB — RAPID URINE DRUG SCREEN, HOSP PERFORMED
Amphetamines: NOT DETECTED
Barbiturates: NOT DETECTED
Benzodiazepines: NOT DETECTED
Cocaine: POSITIVE — AB
Opiates: NOT DETECTED
Tetrahydrocannabinol: NOT DETECTED

## 2021-11-20 NOTE — Progress Notes (Signed)
? ?Sara [name] ?[address] ?[place],  [address] ? ? ?CLINIC:  ?Medical Oncology/Hematology ? ?PCP:  ?Neale Burly, MD ?[place] / [place] [address]  ?[phone] ? 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 ?PRIOR THERAPY: none ? ?CURRENT THERAPY: under work-up ? ?INTERVAL HISTORY:  ?Sara [name], a 46 y.o. female, returns for routine follow-up for her Her2+ locally advanced right breast cancer. Sara [name] was last seen on 10/29/2021. ? ?Today she reports feeling well, and she is accompanied by her boyfriend.  ? ?REVIEW OF SYSTEMS:  ?Review of Systems  ?Constitutional:  Negative for appetite change and fatigue.  ?Cardiovascular:  Positive for chest pain (6/10).  ?Psychiatric/Behavioral:  Positive for sleep disturbance.   ?All other systems reviewed and are negative. ? ?PAST MEDICAL/SURGICAL HISTORY:  ?Past Medical History:  ?Diagnosis Date  ? Anxiety   ? Back pain   ? ?Past Surgical History:  ?Procedure Laterality Date  ? CHOLECYSTECTOMY    ? ORTHOPEDIC SURGERY    ? ? ?SOCIAL HISTORY:  ?Social History  ? ?Socioeconomic History  ? Marital status: Legally Separated  ?  Spouse name: Not on file  ? Number of children: Not on file  ? Years of education: Not on file  ? Highest education level: Not on file  ?Occupational History  ? Not on file  ?Tobacco Use  ? Smoking status: Former  ?  Packs/day: 1.00  ?  Years: 5.00  ?  Pack years: 5.00  ?  Types: Cigarettes  ?  Quit date: 11/08/2021  ?  Years since quitting: 0.0  ? Smokeless tobacco: Never  ?Vaping Use  ? Vaping Use: Never used  ?Substance and Sexual Activity  ? Alcohol use: No  ?  Comment: occasionally  ? Drug use: No  ? Sexual activity: Yes  ?  Birth control/protection: None  ?Other Topics Concern  ? Not on file  ?Social History Narrative  ? Not on file  ? ?Social Determinants of Health  ? ?Financial Resource Strain: Low Risk   ? Difficulty of Paying Living Expenses: Not very hard  ?Food Insecurity:  No Food Insecurity  ? Worried About Charity fundraiser in the Last Year: Never true  ? Ran Out of Food in the Last Year: Never true  ?Transportation Needs: No Transportation Needs  ? Lack of Transportation (Medical): No  ? Lack of Transportation (Non-Medical): No  ?Physical Activity: Sufficiently Active  ? Days of Exercise per Week: 5 days  ? Minutes of Exercise per Session: 30 min  ?Stress: Stress Concern Present  ? Feeling of Stress : Rather much  ?Social Connections: Socially Isolated  ? Frequency of Communication with Friends and Family: Twice a week  ? Frequency of Social Gatherings with Friends and Family: Never  ? Attends Religious Services: 1 to 4 times per year  ? Active Member of Clubs or Organizations: No  ? Attends Archivist Meetings: Never  ? Marital Status: Divorced  ?Intimate Partner Violence: Not At Risk  ? Fear of Current or Ex-Partner: No  ? Emotionally Abused: No  ? Physically Abused: No  ? Sexually Abused: No  ? ? ?FAMILY HISTORY:  ?Family History  ?Problem Relation Age of Onset  ? Cancer Paternal Grandfather   ? Cancer Paternal Grandmother   ?     lung  ? Heart disease Brother   ? Asthma Daughter   ? Asthma Daughter   ? ? ?  CURRENT MEDICATIONS:  ?Current Outpatient Medications  ?Medication Sig Dispense Refill  ? ALPRAZolam (XANAX) 0.5 MG tablet Take 1 tablet (0.5 mg total) by mouth every 8 (eight) hours as needed for anxiety. Do NOT take more than prescribed amount. (Patient taking differently: Take 0.5 mg by mouth 3 (three) times daily. Do NOT take more than prescribed amount.) 42 tablet 0  ? CALCIUM PO Take 1 tablet by mouth 4 (four) times a week.    ? cholecalciferol (VITAMIN D3) 25 MCG (1000 UNIT) tablet Take 1,000 Units by mouth daily.    ? clindamycin (CLEOCIN) 300 MG capsule Take 300 mg by mouth every 6 (six) hours.    ? cloNIDine (CATAPRES) 0.1 MG tablet Take 0.1 mg by mouth at bedtime.    ? gabapentin (NEURONTIN) 100 MG capsule Take 100 mg by mouth 3 (three) times daily.    ?  hydrOXYzine (VISTARIL) 25 MG capsule Take 25 mg by mouth at bedtime.    ? mirtazapine (REMERON) 15 MG tablet Take 7.5 mg by mouth at bedtime.    ? Multiple Vitamin (MULTIVITAMIN WITH MINERALS) TABS tablet Take 1 tablet by mouth in the morning and at bedtime.    ? Multiple Vitamins-Minerals (HAIR/SKIN/NAILS/BIOTIN PO) Take 1 tablet by mouth in the morning and at bedtime.    ? oxyCODONE-acetaminophen (PERCOCET) 7.5-325 MG tablet Take 1 tablet by mouth every 8 (eight) hours as needed for severe pain. Do NOT take more than prescribed. 42 tablet 0  ? Turmeric 500 MG CAPS Take 1,000 mg by mouth daily.    ? Turmeric, Curcuma Longa, (CURCUMIN) POWD Take 1 Scoop by mouth at bedtime.    ? ?No current facility-administered medications for this visit.  ? ? ?ALLERGIES:  ?Allergies  ?Allergen Reactions  ? Hydrocodone Nausea And Vomiting  ? Sulfa Antibiotics Nausea And Vomiting  ? Morphine And Related Rash  ? ? ?PHYSICAL EXAM:  ?Performance status (ECOG): 0 - Asymptomatic ? ?There were no vitals filed for this visit. ?Wt Readings from Last 3 Encounters:  ?11/15/21 129 lb (58.5 kg)  ?11/12/21 129 lb (58.5 kg)  ?10/29/21 132 lb 11.2 oz (60.2 kg)  ? ?Physical Exam ?Vitals reviewed.  ?Constitutional:   ?   Appearance: Normal appearance.  ?Cardiovascular:  ?   Rate and Rhythm: Normal rate and regular rhythm.  ?   Pulses: Normal pulses.  ?   Heart sounds: Normal heart sounds.  ?Pulmonary:  ?   Effort: Pulmonary effort is normal.  ?   Breath sounds: Normal breath sounds.  ?Neurological:  ?   General: No focal deficit present.  ?   Mental Status: She is alert and oriented to person, place, and time.  ?Psychiatric:     ?   Mood and Affect: Mood normal.     ?   Behavior: Behavior normal.  ? ? ?LABORATORY DATA:  ?I have reviewed the labs as listed.  ? ?  Latest Ref Rng & Units 10/25/2019  ? 12:24 PM 02/13/2016  ?  5:39 PM 02/11/2016  ? 12:08 AM  ?CBC  ?WBC 4.0 - 10.5 K/uL 10.2   10.7   12.6    ?Hemoglobin 12.0 - 15.0 g/dL 14.5   12.5   12.5     ?Hematocrit 36.0 - 46.0 % 43.5   38.5   38.2    ?Platelets 150 - 400 K/uL 278   337   289    ? ? ?  Latest Ref Rng & Units 10/25/2019  ? 12:24 PM  02/13/2016  ?  5:39 PM 02/11/2016  ? 12:08 AM  ?CMP  ?Glucose 70 - 99 mg/dL 94   104   85    ?BUN 6 - 20 mg/dL _0 ?Creatinine 0.44 - 1.00 mg/dL 0.84   0.85   0.77    ?Sodium 135 - 145 mmol/L 136   138   137    ?Potassium 3.5 - 5.1 mmol/L 3.9   3.9   3.6    ?Chloride 98 - 111 mmol/L 103   102   107    ?CO2 22 - 32 mmol/L _1 ?Calcium 8.9 - 10.3 mg/dL 9.3   9.1   8.5    ?Total Protein 6.5 - 8.1 g/dL 7.5      ?Total Bilirubin 0.3 - 1.2 mg/dL 0.6      ?Alkaline Phos 38 - 126 U/L 73      ?AST 15 - 41 U/L 20      ?ALT 0 - 44 U/L 47      ? ?   ?Component Value Date/Time  ? RBC 4.42 10/25/2019 1224  ? MCV 98.4 10/25/2019 1224  ? MCH 32.8 10/25/2019 1224  ? MCHC 33.3 10/25/2019 1224  ? RDW 12.9 10/25/2019 1224  ? LYMPHSABS 1.8 10/25/2019 1224  ? MONOABS 0.8 10/25/2019 1224  ? EOSABS 0.1 10/25/2019 1224  ? BASOSABS 0.1 10/25/2019 1224  ? ? ?DIAGNOSTIC IMAGING:  ?I have independently reviewed the scans and discussed with the patient. ?DG Chest Port 1 View ? ?Result Date: 11/18/2021 ?CLINICAL DATA:  Port-A-Cath placement. EXAM: PORTABLE CHEST 1 VIEW COMPARISON:  September 11, 2021. FINDINGS: The heart size and mediastinal contours are within normal limits. Both lungs are clear. Interval placement of left subclavian Port-A-Cath with distal tip in expected position of SVC. No pneumothorax is noted. The visualized skeletal structures are unremarkable. IMPRESSION: Interval placement of left subclavian Port-A-Cath with distal tip in expected position of the SVC. Electronically Signed   By: Marijo Conception M.D.   On: 11/18/2021 11:21  ? ?DG C-Arm 1-60 Min-No Report ? ?Result Date: 11/18/2021 ?Fluoroscopy was utilized by the requesting physician.  No radiographic interpretation.  ? ?ECHOCARDIOGRAM COMPLETE ? ?Result Date: 10/29/2021 ?   ECHOCARDIOGRAM REPORT   Patient Name:    Sara [name] Date of Exam: 10/29/2021 Medical Rec #:  [id number]       Height:       64.0 in Accession #:    [id number]      Weight:       132.7 lb Date of Birth:  [birth year]       BSA:          1.643

## 2021-11-20 NOTE — Patient Instructions (Addendum)
[place] at [facility] ?Discharge Instructions ? ?You were seen and examined today by Dr. Delton Coombes. ? ?Proceed with your PET scan as scheduled. You will follow-up with Dr. Delton Coombes after the PET scan to confirm stage of the cancer and treatment plan. ? ?Dr. Delton Coombes has recommended a combination of chemotherapy and monoclonal antibodies to control the cancer. This will be given here in the clinic through the Port-A-Cath once every 6 weeks. You will receive 6 cycles of chemotherapy and you will receive the monoclonal antibodies as long as the cancer is responding. Once the treatment has started, you should see a decrease in your cancer-related pain. ? ?Common side effects of chemotherapy include fatigue, hair loss and diarrhea. Prior to chemotherapy, you will meet with our chemotherapy educator nurse. ? ?We will plan to start your chemotherapy the week of April 17th. ? ? ?Thank you for choosing [place] at [facility] to provide your oncology and hematology care.  To afford each patient quality time with our provider, please arrive at least 15 minutes before your scheduled appointment time.  ? ?If you have a lab appointment with the [facility] please come in thru the Main Entrance and check in at the main information desk. ? ?You need to re-schedule your appointment should you arrive 10 or more minutes late.  We strive to give you quality time with our providers, and arriving late affects you and other patients whose appointments are after yours.  Also, if you no show three or more times for appointments you may be dismissed from the clinic at the providers discretion.     ?Again, thank you for choosing [facility].  Our hope is that these requests will decrease the amount of time that you wait before being seen by our physicians.       ?_____________________________________________________________ ? ?Should you have questions after your visit  to [facility], please contact our office at [phone] and follow the prompts.  Our office hours are 8:00 a.m. and 4:30 p.m. Monday - Friday.  Please note that voicemails left after 4:00 p.m. may not be returned until the following business day.  We are closed weekends and major holidays.  You do have access to a nurse 24-7, just call the main number to the clinic [phone] and do not press any options, hold on the line and a nurse will answer the phone.   ? ?For prescription refill requests, have your pharmacy contact our office and allow 72 hours.   ? ?Due to Covid, you will need to wear a mask upon entering the hospital. If you do not have a mask, a mask will be given to you at the Main Entrance upon arrival. For doctor visits, patients may have 1 support person age 32 or older with them. For treatment visits, patients can not have anyone with them due to social distancing guidelines and our immunocompromised population.  ? ? ? ?

## 2021-11-21 ENCOUNTER — Encounter (HOSPITAL_COMMUNITY): Payer: Self-pay

## 2021-11-21 NOTE — Progress Notes (Signed)
I met with the patient and her significant other during and following visit with Dr. Delton Coombes. I provided written information on the medications as discussed with Dr. Delton Coombes for proposed chemotherapy regimen. All questions addressed and answered.  ?

## 2021-11-26 ENCOUNTER — Other Ambulatory Visit (HOSPITAL_COMMUNITY): Payer: Self-pay

## 2021-11-26 ENCOUNTER — Other Ambulatory Visit (HOSPITAL_COMMUNITY): Payer: Self-pay | Admitting: *Deleted

## 2021-11-26 ENCOUNTER — Inpatient Hospital Stay (HOSPITAL_COMMUNITY): Payer: Medicaid Other

## 2021-11-26 ENCOUNTER — Encounter (HOSPITAL_COMMUNITY): Payer: Self-pay

## 2021-11-26 DIAGNOSIS — F419 Anxiety disorder, unspecified: Secondary | ICD-10-CM

## 2021-11-26 DIAGNOSIS — G893 Neoplasm related pain (acute) (chronic): Secondary | ICD-10-CM

## 2021-11-26 MED ORDER — FLUCONAZOLE 100 MG PO TABS: 100.0000 mg | ORAL_TABLET | Freq: Every day | ORAL | 0 refills | Status: DC

## 2021-11-26 NOTE — Patient Instructions (Addendum)
[facility] ?Chemotherapy Teaching ?  ?  ?You are diagnosed with Stage IV breast cancer. You will be treated in the clinic every 3 weeks with a combination of chemotherapy drugs and monoclonal antibodies.  Those drugs are Taxotere, Herceptin, and Perjeta. You will receive all three drugs for 6 cycles. After 6 cycles you will receive only Herceptin and Perjeta as maintenance therapy.  The intent of treatment is to shrink and control your cancer, prevent it from spreading further, and to alleviate any symptoms you may have related to this disease. We will continue treatment as long as it is helping to control this cancer. You will see the doctor regularly throughout treatment.  We will obtain blood work from you prior to every treatment and monitor your results to make sure it is safe to give your treatment. The doctor monitors your response to treatment by the way you are feeling, your blood work, and by obtaining scans periodically.  There will be wait times while you are here for treatment.  It will take about 30 minutes to 1 hour for your lab work to result.  Then there will be wait times while pharmacy mixes your medications.   ?  ?  ?Medications you will receive in the clinic prior to your chemotherapy medications: ?  ?Aloxi:  ALOXI is used in adults to help prevent nausea and vomiting that happens with certain chemotherapy drugs.  Aloxi is a long acting medication, and will remain in your system for about two days.  ?  ?Dexamethasone:  This is a steroid given prior to chemotherapy to help prevent allergic reactions; it may also help prevent and control nausea and diarrhea.   ?  ?  ?  ?Docetaxel (Taxotere)  ?  ?About This Drug  ?  ?Docetaxel is used to treat cancer. It is given in the vein (IV). It will take 1 hour to infuse.  The first infusion will take longer (about 1.5 hours) due to the fact that we start this drug at a very slow rate and gradually increase the rate of infusion until the maximum  rate is reached.  This is done to monitor for infusion reactions, and to make sure you will tolerated this drug without adverse effects.  If you tolerate the first infusion, all subsequent infusions will be started at the normal rate and given over 1 hour. ?  ?Possible Side Effects  ?  ? Bone marrow suppression. This is a decrease in the number of white blood cells, red blood cells, and platelets. This may raise your risk of infection, make you tired and weak, and raise your risk of bleeding.  ?  ? Neutropenic fever. A type of fever that can develop when you have a very low number of white blood cells which can be life-threatening.  ?  ? Soreness of the mouth and throat. You may have red areas, white patches, or sores that hurt.  ?  ? Nausea and vomiting (throwing up)  ?  ? Constipation (not able to move bowels)  ?  ? Diarrhea (loose bowel movements)  ?  ? Infections  ?  ? Fluid retention - swelling of the hands, feet, or any other part of the body. Fluid may build-up around your lungs and/or heart.  ?  ? Changes in the way food and drinks taste  ?  ? Effects on the nerves are called peripheral neuropathy. You may feel numbness, tingling, or pain in your hands and feet. It  may be hard for you to button your clothes, open jars, or walk as usual. The effect on the nerves may get worse with more doses of the drug. These effects get better in some people after the drug is stopped but it does not get better in all people.  ?  ? Decreased appetite (decreased hunger)  ?  ? Weakness  ?  ? Pain  ?  ? Muscle pain/aching  ?  ? Trouble breathing  ?  ? Changes in your nail color, you may have nail loss and/or brittle nail  ?  ? Hair loss. Hair loss is often temporary, although there have been cases of permanent hair loss reported. Hair loss may happen suddenly or gradually. If you lose hair, you may lose it from your head, face, armpits, pubic area, chest, and/or legs. You may also notice your hair getting thin.  ?  ? Allergic  skin reaction. You may develop blisters on your skin that are filled with fluid or a severe red rash all over your body that may be painful.  ?  ? Allergic reactions, including anaphylaxis are rare but may happen in some patients. Signs of allergic reaction to this drug may be swelling of the face, feeling like your tongue or throat are swelling, trouble breathing, rash, itching, fever, chills, feeling dizzy, and/or feeling that your heart is beating in a fast or not normal way. If this happens, do not take another dose of this drug. You should get urgent medical treatment.  ?  ?Note: Not all possible side effects are included above.  ?  ?Warnings and Precautions  ?  ? Severe bone marrow suppression, including febrile neutropenia which can be life-threatening  ?  ? Severe allergic reactions, including anaphylaxis which can be life-threatening  Colitis, which is swelling (inflammation) in the colon - symptoms are diarrhea (loose bowel movements), stomach cramping, and sometimes blood in the bowel movements  Severe skin reactions, including redness, swelling, or peeling of skin  Severe swelling in the eye or other changes in eyesight  ?  ? Severe fluid retention  ?  ? If you have a history of abnormal liver function, receive high doses of docetaxel, or have a history of lung cancer and have received treatment with a platinum (type of chemotherapy medication), you have an increased risk of death.  ?  ? Severe weakness  ?  ? This drug may raise your risk of getting a second cancer such as leukemia, lymphoma, myelodysplastic syndrome, and kidney cancer.  ?  ? Severe peripheral neuropathy  ?  ? This drug contains alcohol and may affect your central nervous system. The central nervous system is made up of your brain and spinal cord. You may feel dizzy and very sleepy.  ?  ? Tumor lysis syndrome: This drug may act on the cancer cells very quickly. This may affect how your kidneys work. Note: Some of the side effects above  are very rare. If you have concerns and/or questions, please discuss them with your medical team. Important Information  ?  ? This drug may be present in the saliva, tears, sweat, urine, stool, vomit, semen, and vaginal secretions. Talk to your doctor and/or your nurse about the necessary precautions to take during this time.  ?  ? This drug may impair your ability to drive or use machinery. Use caution and tell your nurse or doctor if you feel dizzy, very sleepy, and/or experience low blood pressure. Treating Side Effects  ?  ?  Manage tiredness by pacing your activities for the day.  ?  ? Be sure to include periods of rest between energy-draining activities.  ?  ? Get regular exercise. If you feel too tired to exercise vigorously, try taking a short walk.   ?  ? To decrease the risk of infection, wash your hands regularly.  ?  ? Avoid close contact with people who have a cold, the flu, or other infections.  ?  ? Take your temperature as your doctor or nurse tells you, and whenever you feel like you may have a fever.  ?  ? To help decrease the risk of bleeding, use a soft toothbrush. Check with your nurse before using dental floss.  ?  ? Be very careful when using knives or tools.  ?  ? Use an electric shaver instead of a razor.  ?  ? Mouth care is very important and will help food taste better and improve your appetite. Your mouth care should consist of routine, gentle cleaning of your teeth or dentures and rinsing your mouth with a mixture of 1/2 teaspoon of salt in 8 ounces of water or 1/2 teaspoon of baking soda in 8 ounces of water. This should be done at least after each meal and at bedtime.  ?  ? If you have mouth sores, avoid mouthwash that has alcohol. Also avoid alcohol and smoking because they can bother your mouth and throat.  ?  ? Taking good care of your mouth may help food taste better and improve your appetite  ?  ? Drink plenty of fluids (a minimum of eight glasses per day is recommended).  ?  ? If  you throw up or have diarrhea, you should drink more fluids so that you do not become dehydrated (lack of water in the body from losing too much fluid).  ?  ? If you have diarrhea, eat low-fiber foods

## 2021-11-26 NOTE — Progress Notes (Signed)
Two messages received from patient today. The first message was requesting a call back. I attempted to reach patient - she was unavailable and VM box is full. The second message was asking for something to treat a yeast infection. Diflucan sent in to [facility] per standing order from Dr. Delton Coombes. Unable to reach patient again, VM box is full. Patient scheduled to see LCSW and chemotherapy education tomorrow - I will be available to speak with the patient at request. ?

## 2021-11-27 ENCOUNTER — Other Ambulatory Visit (HOSPITAL_COMMUNITY): Payer: Medicaid Other | Admitting: Licensed Clinical Social Worker

## 2021-11-27 ENCOUNTER — Inpatient Hospital Stay (HOSPITAL_COMMUNITY): Payer: Medicaid Other

## 2021-11-27 MED ORDER — LIDOCAINE-PRILOCAINE 2.5-2.5 % EX CREA: TOPICAL_CREAM | CUTANEOUS | 3 refills | Status: DC

## 2021-11-27 MED ORDER — PROCHLORPERAZINE MALEATE 10 MG PO TABS: 10.0000 mg | ORAL_TABLET | Freq: Four times a day (QID) | ORAL | 1 refills | Status: DC | PRN

## 2021-11-28 ENCOUNTER — Encounter (HOSPITAL_COMMUNITY)
Admission: RE | Admit: 2021-11-28 | Discharge: 2021-11-28 | Disposition: A | Discharge status: Home / Self Care | Payer: Medicaid Other | Source: Ambulatory Visit | Attending: Hematology | Admitting: Hematology

## 2021-11-28 DIAGNOSIS — Z170 Estrogen receptor positive status [ER+]: Secondary | ICD-10-CM | POA: Diagnosis present

## 2021-11-28 DIAGNOSIS — C50111 Malignant neoplasm of central portion of right female breast: Secondary | ICD-10-CM | POA: Insufficient documentation

## 2021-11-28 MED ORDER — FLUDEOXYGLUCOSE F - 18 (FDG) INJECTION
6.9500 | Freq: Once | INTRAVENOUS | Status: AC | PRN
  Administered 2021-11-28: 6.95 via INTRAVENOUS

## 2021-12-02 ENCOUNTER — Inpatient Hospital Stay (HOSPITAL_COMMUNITY): Payer: Medicaid Other

## 2021-12-03 ENCOUNTER — Inpatient Hospital Stay (HOSPITAL_COMMUNITY): Payer: Medicaid Other

## 2021-12-03 ENCOUNTER — Encounter (HOSPITAL_COMMUNITY): Payer: Self-pay | Admitting: Licensed Clinical Social Worker

## 2021-12-03 ENCOUNTER — Other Ambulatory Visit (HOSPITAL_COMMUNITY): Payer: Self-pay

## 2021-12-03 ENCOUNTER — Inpatient Hospital Stay (HOSPITAL_BASED_OUTPATIENT_CLINIC_OR_DEPARTMENT_OTHER): Payer: Medicaid Other | Admitting: Hematology

## 2021-12-03 VITALS — BP 119/79 | HR 66 | Temp 97.5°F | Resp 16

## 2021-12-03 DIAGNOSIS — Z5112 Encounter for antineoplastic immunotherapy: Secondary | ICD-10-CM | POA: Diagnosis not present

## 2021-12-03 DIAGNOSIS — C50911 Malignant neoplasm of unspecified site of right female breast: Secondary | ICD-10-CM | POA: Diagnosis not present

## 2021-12-03 DIAGNOSIS — E876 Hypokalemia: Secondary | ICD-10-CM

## 2021-12-03 DIAGNOSIS — F419 Anxiety disorder, unspecified: Secondary | ICD-10-CM

## 2021-12-03 DIAGNOSIS — Z170 Estrogen receptor positive status [ER+]: Secondary | ICD-10-CM

## 2021-12-03 LAB — COMPREHENSIVE METABOLIC PANEL
ALT: 22 U/L (ref 0–44)
AST: 29 U/L (ref 15–41)
Albumin: 3.6 g/dL (ref 3.5–5.0)
Alkaline Phosphatase: 71 U/L (ref 38–126)
Anion gap: 8 (ref 5–15)
BUN: 17 mg/dL (ref 6–20)
CO2: 25 mmol/L (ref 22–32)
Calcium: 8.9 mg/dL (ref 8.9–10.3)
Chloride: 104 mmol/L (ref 98–111)
Creatinine, Ser: 0.83 mg/dL (ref 0.44–1.00)
GFR, Estimated: >60 mL/min (ref >60)
Glucose, Bld: 127 mg/dL — ABNORMAL HIGH (ref 70–99)
Potassium: 3.2 mmol/L — ABNORMAL LOW (ref 3.5–5.1)
Sodium: 137 mmol/L (ref 135–145)
Total Bilirubin: 0.5 mg/dL (ref 0.3–1.2)
Total Protein: 7.1 g/dL (ref 6.5–8.1)

## 2021-12-03 LAB — CBC WITH DIFFERENTIAL/PLATELET
Abs Immature Granulocytes: 0.03 10*3/uL (ref 0.00–0.07)
Basophils Absolute: 0.1 10*3/uL (ref 0.0–0.1)
Basophils Relative: 1 %
Eosinophils Absolute: 0.3 10*3/uL (ref 0.0–0.5)
Eosinophils Relative: 4 %
HCT: 36.9 % (ref 36.0–46.0)
Hemoglobin: 12.4 g/dL (ref 12.0–15.0)
Immature Granulocytes: 0 %
Lymphocytes Relative: 24 %
Lymphs Abs: 2 10*3/uL (ref 0.7–4.0)
MCH: 31.6 pg (ref 26.0–34.0)
MCHC: 33.6 g/dL (ref 30.0–36.0)
MCV: 93.9 fL (ref 80.0–100.0)
Monocytes Absolute: 0.8 10*3/uL (ref 0.1–1.0)
Monocytes Relative: 9 %
Neutro Abs: 4.9 10*3/uL (ref 1.7–7.7)
Neutrophils Relative %: 62 %
Platelets: 259 10*3/uL (ref 150–400)
RBC: 3.93 MIL/uL (ref 3.87–5.11)
RDW: 12.2 % (ref 11.5–15.5)
WBC: 8 10*3/uL (ref 4.0–10.5)
nRBC: 0 % (ref 0.0–0.2)

## 2021-12-03 LAB — RAPID URINE DRUG SCREEN, HOSP PERFORMED
Amphetamines: NOT DETECTED
Barbiturates: NOT DETECTED
Benzodiazepines: POSITIVE — AB
Cocaine: POSITIVE — AB
Opiates: NOT DETECTED
Tetrahydrocannabinol: POSITIVE — AB

## 2021-12-03 LAB — MAGNESIUM: Magnesium: 1.9 mg/dL (ref 1.7–2.4)

## 2021-12-03 MED ORDER — SODIUM CHLORIDE 0.9 % IV SOLN
840.0000 mg | Freq: Once | INTRAVENOUS | Status: AC
  Administered 2021-12-03: 840 mg via INTRAVENOUS
  Filled 2021-12-03: qty 28

## 2021-12-03 MED ORDER — HEPARIN SOD (PORK) LOCK FLUSH 100 UNIT/ML IV SOLN
500.0000 [IU] | Freq: Once | INTRAVENOUS | Status: AC | PRN
  Administered 2021-12-03: 500 [IU]

## 2021-12-03 MED ORDER — SODIUM CHLORIDE 0.9% FLUSH
10.0000 mL | INTRAVENOUS | Status: DC | PRN
  Administered 2021-12-03: 10 mL

## 2021-12-03 MED ORDER — LORAZEPAM 2 MG/ML IJ SOLN
1.0000 mg | Freq: Once | INTRAMUSCULAR | Status: AC
  Administered 2021-12-03: 1 mg via INTRAVENOUS
  Filled 2021-12-03: qty 1

## 2021-12-03 MED ORDER — SODIUM CHLORIDE 0.9 % IV SOLN
10.0000 mg | Freq: Once | INTRAVENOUS | Status: AC
  Administered 2021-12-03: 10 mg via INTRAVENOUS
  Filled 2021-12-03: qty 10

## 2021-12-03 MED ORDER — DIPHENHYDRAMINE HCL 25 MG PO CAPS
50.0000 mg | ORAL_CAPSULE | Freq: Once | ORAL | Status: AC
  Administered 2021-12-03: 50 mg via ORAL
  Filled 2021-12-03: qty 2

## 2021-12-03 MED ORDER — ACETAMINOPHEN 325 MG PO TABS
650.0000 mg | ORAL_TABLET | Freq: Once | ORAL | Status: AC
  Administered 2021-12-03: 650 mg via ORAL
  Filled 2021-12-03: qty 2

## 2021-12-03 MED ORDER — SODIUM CHLORIDE 0.9 % IV SOLN
75.0000 mg/m2 | Freq: Once | INTRAVENOUS | Status: AC
  Administered 2021-12-03: 120 mg via INTRAVENOUS
  Filled 2021-12-03: qty 12

## 2021-12-03 MED ORDER — PALONOSETRON HCL INJECTION 0.25 MG/5ML
0.2500 mg | Freq: Once | INTRAVENOUS | Status: AC
  Administered 2021-12-03: 0.25 mg via INTRAVENOUS
  Filled 2021-12-03: qty 5

## 2021-12-03 MED ORDER — DIPHENOXYLATE-ATROPINE 2.5-0.025 MG PO TABS: 1.0000 | ORAL_TABLET | Freq: Four times a day (QID) | ORAL | 0 refills | Status: DC | PRN

## 2021-12-03 MED ORDER — TRASTUZUMAB-DKST CHEMO 150 MG IV SOLR
8.0000 mg/kg | Freq: Once | INTRAVENOUS | Status: AC
  Administered 2021-12-03: 483 mg via INTRAVENOUS
  Filled 2021-12-03: qty 20

## 2021-12-03 MED ORDER — SODIUM CHLORIDE 0.9 % IV SOLN: Freq: Once | INTRAVENOUS | Status: AC

## 2021-12-03 MED ORDER — POTASSIUM CHLORIDE CRYS ER 20 MEQ PO TBCR
40.0000 meq | EXTENDED_RELEASE_TABLET | Freq: Once | ORAL | Status: AC
  Administered 2021-12-03: 40 meq via ORAL
  Filled 2021-12-03: qty 2

## 2021-12-03 NOTE — Patient Instructions (Addendum)
[place] at [facility] ?Discharge Instructions ? ?You were seen and examined today by Dr. Delton Coombes. ? ?Dr. Delton Coombes reviewed your recent PET scan which confirmed Stage IV breast cancer with lymph node, bone and liver involvement. This means that the cancer cannot be cured but it can be controlled with treatment. ? ?The most common side effect of treatment is diarrhea. Dr. Delton Coombes has recommended taking Imodium (see handout). If Imodium is not controlling the diarrhea, please use Lomotil as prescribed. ? ?Proceed with your first treatment today. Follow-up as scheduled. ? ? ?Thank you for choosing [place] at [facility] to provide your oncology and hematology care.  To afford each patient quality time with our provider, please arrive at least 15 minutes before your scheduled appointment time.  ? ?If you have a lab appointment with the [facility] please come in thru the Main Entrance and check in at the main information desk. ? ?You need to re-schedule your appointment should you arrive 10 or more minutes late.  We strive to give you quality time with our providers, and arriving late affects you and other patients whose appointments are after yours.  Also, if you no show three or more times for appointments you may be dismissed from the clinic at the providers discretion.     ?Again, thank you for choosing [facility].  Our hope is that these requests will decrease the amount of time that you wait before being seen by our physicians.       ?_____________________________________________________________ ? ?Should you have questions after your visit to [facility], please contact our office at [phone] and follow the prompts.  Our office hours are 8:00 a.m. and 4:30 p.m. Monday - Friday.  Please note that voicemails left after 4:00 p.m. may not be returned until the following business day.  We are closed weekends and major  holidays.  You do have access to a nurse 24-7, just call the main number to the clinic [phone] and do not press any options, hold on the line and a nurse will answer the phone.   ? ?For prescription refill requests, have your pharmacy contact our office and allow 72 hours.   ? ?Due to Covid, you will need to wear a mask upon entering the hospital. If you do not have a mask, a mask will be given to you at the Main Entrance upon arrival. For doctor visits, patients may have 1 support person age 30 or older with them. For treatment visits, patients can not have anyone with them due to social distancing guidelines and our immunocompromised population.  ? ? ? ?

## 2021-12-03 NOTE — Patient Instructions (Signed)
[facility]  Discharge Instructions: ?Thank you for choosing [facility] to provide your oncology and hematology care.  ?If you have a lab appointment with the [facility], please come in thru the Main Entrance and check in at the main information desk. ? ?Wear comfortable clothing and clothing appropriate for easy access to any Portacath or PICC line.  ? ?We strive to give you quality time with your provider. You may need to reschedule your appointment if you arrive late (15 or more minutes).  Arriving late affects you and other patients whose appointments are after yours.  Also, if you miss three or more appointments without notifying the office, you may be dismissed from the clinic at the provider?s discretion.    ?  ?For prescription refill requests, have your pharmacy contact our office and allow 72 hours for refills to be completed.   ? ?Today you received the following chemotherapy and/or immunotherapy agents Ogivri, Perjeta, and Taxotere.  ?  ?To help prevent nausea and vomiting after your treatment, we encourage you to take your nausea medication as directed. ? ?BELOW ARE SYMPTOMS THAT SHOULD BE REPORTED IMMEDIATELY: ?*FEVER GREATER THAN 100.4 F (38 ?C) OR HIGHER ?*CHILLS OR SWEATING ?*NAUSEA AND VOMITING THAT IS NOT CONTROLLED WITH YOUR NAUSEA MEDICATION ?*UNUSUAL SHORTNESS OF BREATH ?*UNUSUAL BRUISING OR BLEEDING ?*URINARY PROBLEMS (pain or burning when urinating, or frequent urination) ?*BOWEL PROBLEMS (unusual diarrhea, constipation, pain near the anus) ?TENDERNESS IN MOUTH AND THROAT WITH OR WITHOUT PRESENCE OF ULCERS (sore throat, sores in mouth, or a toothache) ?UNUSUAL RASH, SWELLING OR PAIN  ?UNUSUAL VAGINAL DISCHARGE OR ITCHING  ? ?Items with * indicate a potential emergency and should be followed up as soon as possible or go to the Emergency Department if any problems should occur. ? ?Please show the CHEMOTHERAPY ALERT CARD or IMMUNOTHERAPY ALERT CARD at check-in to  the Emergency Department and triage nurse. ? ?Should you have questions after your visit or need to cancel or reschedule your appointment, please contact [facility] [phone]  and follow the prompts.  Office hours are 8:00 a.m. to 4:30 p.m. Monday - Friday. Please note that voicemails left after 4:00 p.m. may not be returned until the following business day.  We are closed weekends and major holidays. You have access to a nurse at all times for urgent questions. Please call the main number to the clinic [phone] and follow the prompts. ? ?For any non-urgent questions, you may also contact your provider using MyChart. We now offer e-Visits for anyone 49 and older to request care online for non-urgent symptoms. For details visit [email or web address]. ?  ?Also download the MyChart app! Go to the app store, search "MyChart", open the app, select [facility], and log in with your MyChart username and password. ? ?Due to Covid, a mask is required upon entering the hospital/clinic. If you do not have a mask, one will be given to you upon arrival. For doctor visits, patients may have 1 support person aged 83 or older with them. For treatment visits, patients cannot have anyone with them due to current Covid guidelines and our immunocompromised population.  ?

## 2021-12-03 NOTE — Progress Notes (Signed)
Referred to Duanne Limerick to assist with transport.  Pt also encouraged to utilize Medicaid transport. ?

## 2021-12-03 NOTE — Progress Notes (Signed)
Pharmacist Chemotherapy Monitoring - Initial Assessment   ? ?Anticipated start date: 12/03/21  ? ?The following has been reviewed per standard work regarding the patient's treatment regimen: ?The patient's diagnosis, treatment plan and drug doses, and organ/hematologic function ?Lab orders and baseline tests specific to treatment regimen  ?The treatment plan start date, drug sequencing, and pre-medications ?Prior authorization status  ?Patient's documented medication list, including drug-drug interaction screen and prescriptions for anti-emetics and supportive care specific to the treatment regimen ?The drug concentrations, fluid compatibility, administration routes, and timing of the medications to be used ?The patient's access for treatment and lifetime cumulative dose history, if applicable  ?The patient's medication allergies and previous infusion related reactions, if applicable  ? ?Changes made to treatment plan:  ?N/A ? ?Follow up needed:  ?N/A ? ? ?Sara [name], [facility], ?12/03/2021  8:41 AM ? ?

## 2021-12-03 NOTE — Progress Notes (Signed)
? ?[name] ?[address] ?[place],  [address] ? ? ?CLINIC:  ?Medical Oncology/Hematology ? ?PCP:  ?Neale Burly, MD ?[place] / [place] [address] ?[phone] ? ? 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 ?PRIOR THERAPY: none ? ?NGS Results: not done ? ?CURRENT THERAPY: DOCEtaxel + Trastuzumab + Pertuzumab (THP) q21d x 8 cycles / Trastuzumab + Pertuzumab q21d x 4 cycles ? ?BRIEF ONCOLOGIC HISTORY:  ?Oncology History  ?HER2-positive carcinoma of right breast ([facility])  ?10/28/2021 Initial Diagnosis  ? Breast cancer, right breast ([facility]) ? ?  ?12/03/2021 -  Chemotherapy  ? Patient is on Treatment Plan : BREAST DOCEtaxel + Trastuzumab + Pertuzumab (THP) q21d x 8 cycles / Trastuzumab + Pertuzumab q21d x 4 cycles  ? ?  ?  ? ? ?CANCER STAGING: ? Cancer Staging  ?HER2-positive carcinoma of right breast ([facility]) ?Staging form: Breast, AJCC 8th Edition ?- Clinical stage from 10/28/2021: Stage IV (cT4d, cN1, cM1, G3, ER+, PR-, HER2+) - Unsigned ? ? ?INTERVAL HISTORY:  ?Ms. Sara [name], a 45 y.o. female, returns for routine follow-up and consideration for next cycle of chemotherapy. Sara [name] was last seen on 11/20/2021. ? ?Due for cycle #1 of THP today.  ? ?Overall, she tells me she has been feeling pretty well. She is anxious about starting treatment. She reports chronic back pain which she reports has worsened. She denies dental issues, and she regularly sees a dentist. She has lost 7 lbs since her last visit as she reports her appetite is poor due to stress. ? ?Overall, she feels ready for next cycle of chemo today.  ? ?REVIEW OF SYSTEMS:  ?Review of Systems  ?Constitutional:  Positive for appetite change and unexpected weight change (-7 lbs). Negative for fatigue.  ?Cardiovascular:  Positive for chest pain (8/10 R breast).  ?Musculoskeletal:  Positive for back pain.  ?Neurological:  Positive for headaches.  ?Psychiatric/Behavioral:  Positive for depression and  sleep disturbance. The patient is nervous/anxious.   ?All other systems reviewed and are negative. ? ?PAST MEDICAL/SURGICAL HISTORY:  ?Past Medical History:  ?Diagnosis Date  ? Anxiety   ? Back pain   ? ?Past Surgical History:  ?Procedure Laterality Date  ? CHOLECYSTECTOMY    ? ORTHOPEDIC SURGERY    ? PORTACATH PLACEMENT Left 11/18/2021  ? Procedure: INSERTION PORT-A-CATH;  Surgeon: Virl Cagey, MD;  Location: [facility];  Service: General;  Laterality: Left;  ? ? ?SOCIAL HISTORY:  ?Social History  ? ?Socioeconomic History  ? Marital status: Legally Separated  ?  Spouse name: Not on file  ? Number of children: Not on file  ? Years of education: Not on file  ? Highest education level: Not on file  ?Occupational History  ? Not on file  ?Tobacco Use  ? Smoking status: Former  ?  Packs/day: 1.00  ?  Years: 5.00  ?  Pack years: 5.00  ?  Types: Cigarettes  ?  Quit date: 11/08/2021  ?  Years since quitting: 0.0  ? Smokeless tobacco: Never  ?Vaping Use  ? Vaping Use: Never used  ?Substance and Sexual Activity  ? Alcohol use: No  ?  Comment: occasionally  ? Drug use: No  ? Sexual activity: Yes  ?  Birth control/protection: None  ?Other Topics Concern  ? Not on file  ?Social History Narrative  ? Not on file  ? ?Social Determinants of Health  ? ?Financial Resource Strain: Low Risk   ?  Difficulty of Paying Living Expenses: Not very hard  ?Food Insecurity: No Food Insecurity  ? Worried About Charity fundraiser in the Last Year: Never true  ? Ran Out of Food in the Last Year: Never true  ?Transportation Needs: No Transportation Needs  ? Lack of Transportation (Medical): No  ? Lack of Transportation (Non-Medical): No  ?Physical Activity: Sufficiently Active  ? Days of Exercise per Week: 5 days  ? Minutes of Exercise per Session: 30 min  ?Stress: Stress Concern Present  ? Feeling of Stress : Rather much  ?Social Connections: Socially Isolated  ? Frequency of Communication with Friends and Family: Twice a week  ? Frequency of  Social Gatherings with Friends and Family: Never  ? Attends Religious Services: 1 to 4 times per year  ? Active Member of Clubs or Organizations: No  ? Attends Archivist Meetings: Never  ? Marital Status: Divorced  ?Intimate Partner Violence: Not At Risk  ? Fear of Current or Ex-Partner: No  ? Emotionally Abused: No  ? Physically Abused: No  ? Sexually Abused: No  ? ? ?FAMILY HISTORY:  ?Family History  ?Problem Relation Age of Onset  ? Cancer Paternal Grandfather   ? Cancer Paternal Grandmother   ?     lung  ? Heart disease Brother   ? Asthma Daughter   ? Asthma Daughter   ? ? ?CURRENT MEDICATIONS:  ?Current Outpatient Medications  ?Medication Sig Dispense Refill  ? ALPRAZolam (XANAX) 0.5 MG tablet Take 1 tablet (0.5 mg total) by mouth every 8 (eight) hours as needed for anxiety. Do NOT take more than prescribed amount. (Patient taking differently: Take 0.5 mg by mouth 3 (three) times daily. Do NOT take more than prescribed amount.) 42 tablet 0  ? CALCIUM PO Take 1 tablet by mouth 4 (four) times a week.    ? cholecalciferol (VITAMIN D3) 25 MCG (1000 UNIT) tablet Take 1,000 Units by mouth daily.    ? clindamycin (CLEOCIN) 300 MG capsule Take 300 mg by mouth every 6 (six) hours.    ? cloNIDine (CATAPRES) 0.1 MG tablet Take 0.1 mg by mouth at bedtime.    ? DOCEtaxel (TAXOTERE IV) Inject into the vein every 21 ( twenty-one) days.    ? fluconazole (DIFLUCAN) 100 MG tablet Take 1 tablet (100 mg total) by mouth daily. Take 2 tablets first day, followed by one tablet daily. 5 tablet 0  ? gabapentin (NEURONTIN) 100 MG capsule Take 100 mg by mouth 3 (three) times daily.    ? hydrOXYzine (VISTARIL) 25 MG capsule Take 25 mg by mouth at bedtime.    ? mirtazapine (REMERON) 15 MG tablet Take 7.5 mg by mouth at bedtime.    ? Multiple Vitamin (MULTIVITAMIN WITH MINERALS) TABS tablet Take 1 tablet by mouth in the morning and at bedtime.    ? Multiple Vitamins-Minerals (HAIR/SKIN/NAILS/BIOTIN PO) Take 1 tablet by mouth in  the morning and at bedtime.    ? oxyCODONE-acetaminophen (PERCOCET) 7.5-325 MG tablet Take 1 tablet by mouth every 8 (eight) hours as needed for severe pain. Do NOT take more than prescribed. 42 tablet 0  ? Pertuzumab (PERJETA IV) Inject into the vein every 21 ( twenty-one) days.    ? Trastuzumab (HERCEPTIN IV) Inject into the vein every 21 ( twenty-one) days.    ? Turmeric 500 MG CAPS Take 1,000 mg by mouth daily.    ? Turmeric, Curcuma Longa, (CURCUMIN) POWD Take 1 Scoop by mouth at bedtime.    ?  lidocaine-prilocaine (EMLA) cream Apply a small amount to port a cath site and cover with plastic wrap 1 hour prior to infusion appointments (Patient not taking: Reported on 12/03/2021) 30 g 3  ? prochlorperazine (COMPAZINE) 10 MG tablet Take 1 tablet (10 mg total) by mouth every 6 (six) hours as needed (Nausea or vomiting). (Patient not taking: Reported on 12/03/2021) 30 tablet 1  ? ?No current facility-administered medications for this visit.  ? ?Facility-Administered Medications Ordered in Other Visits  ?Medication Dose Route Frequency Provider Last Rate Last Admin  ? 0.9 %  sodium chloride infusion   Intravenous Once Derek Jack, MD      ? acetaminophen (TYLENOL) tablet 650 mg  650 mg Oral Once Derek Jack, MD      ? diphenhydrAMINE (BENADRYL) capsule 50 mg  50 mg Oral Once Derek Jack, MD      ? heparin lock flush 100 unit/mL  500 Units Intracatheter Once PRN Derek Jack, MD      ? LORazepam (ATIVAN) injection 1 mg  1 mg Intravenous Once Derek Jack, MD      ? pertuzumab (PERJETA) 840 mg in sodium chloride 0.9 % 250 mL chemo infusion  840 mg Intravenous Once Derek Jack, MD      ? sodium chloride flush (NS) 0.9 % injection 10 mL  10 mL Intracatheter PRN Derek Jack, MD      ? trastuzumab-dkst (OGIVRI) 483 mg in sodium chloride 0.9 % 250 mL chemo infusion  8 mg/kg (Treatment Plan Recorded) Intravenous Once Derek Jack, MD      ? ? ?ALLERGIES:   ?Allergies  ?Allergen Reactions  ? Hydrocodone Nausea And Vomiting  ? Sulfa Antibiotics Nausea And Vomiting  ? Morphine And Related Rash  ? ? ?PHYSICAL EXAM:  ?Performance status (ECOG): 0 - Asymptomatic ? ?There w

## 2021-12-03 NOTE — Progress Notes (Signed)
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
## 2021-12-03 NOTE — Progress Notes (Signed)
Patients port flushed without difficulty.  Good blood return noted with no bruising or swelling noted at site.  Stable during access and blood draw.  Patient to remain accessed for treatment. 

## 2021-12-03 NOTE — Progress Notes (Signed)
Give the patient Ativan 1 mg before treatment verbal order Dr. Delton Coombes.   ? ?Potassium 40 meq by mouth x 1 verbal order Dr. Delton Coombes.  ? ?Patient tolerated chemotherapy with no complaints voiced.  Side effects with management reviewed with understanding verbalized.  Port site clean and dry with no bruising or swelling noted at site.  Good blood return noted before and after administration of chemotherapy.  Band aid applied.  Patient left in satisfactory condition with VSS and no s/s of distress noted.   ?

## 2021-12-04 ENCOUNTER — Other Ambulatory Visit (HOSPITAL_COMMUNITY): Payer: Medicaid Other | Admitting: Licensed Clinical Social Worker

## 2021-12-04 ENCOUNTER — Encounter (HOSPITAL_COMMUNITY): Payer: Self-pay | Admitting: Hematology

## 2021-12-04 NOTE — Progress Notes (Signed)
Called and spoke with patient for her 24 hour call back. Patient denies any side effects from her treatment other than slight nausea. Patient instructed to use her compazine at home. Patient's phone kept being muted during call per patient's words. Patient states she left a message with Lilia Pro pertaining to pain medication and Subutex administration. Patient states that Dr. Delton Coombes is supposed to collaborate with her doctor that gives her pain medication. Phone went dead and unable to reach patient after that.  ?

## 2021-12-05 ENCOUNTER — Ambulatory Visit (HOSPITAL_COMMUNITY)
Admission: RE | Admit: 2021-12-05 | Discharge: 2021-12-05 | Disposition: A | Discharge status: Home / Self Care | Payer: Medicaid Other | Source: Ambulatory Visit | Attending: Hematology | Admitting: Hematology

## 2021-12-05 ENCOUNTER — Encounter (HOSPITAL_COMMUNITY): Payer: Self-pay

## 2021-12-05 ENCOUNTER — Inpatient Hospital Stay (HOSPITAL_COMMUNITY): Payer: Medicaid Other

## 2021-12-05 VITALS — BP 117/71 | HR 84 | Temp 97.5°F | Resp 18

## 2021-12-05 DIAGNOSIS — Z5112 Encounter for antineoplastic immunotherapy: Secondary | ICD-10-CM | POA: Diagnosis not present

## 2021-12-05 DIAGNOSIS — C50911 Malignant neoplasm of unspecified site of right female breast: Secondary | ICD-10-CM

## 2021-12-05 DIAGNOSIS — Z170 Estrogen receptor positive status [ER+]: Secondary | ICD-10-CM

## 2021-12-05 MED ORDER — PEGFILGRASTIM-CBQV 6 MG/0.6ML ~~LOC~~ SOSY
6.0000 mg | PREFILLED_SYRINGE | Freq: Once | SUBCUTANEOUS | Status: AC
  Administered 2021-12-05: 6 mg via SUBCUTANEOUS
  Filled 2021-12-05: qty 0.6

## 2021-12-05 NOTE — Progress Notes (Signed)
I met with the patient today during her injection appointment. Patient tells me that she felt well yesterday following first treatment but reports nausea today, has taken prescription for compazine. We discuss her family and how her daughters are coping with the patient's diagnosis. Patient is tearful when discussing children, but we discuss that coping will likely be an ongoing process. Patient states that she knows this is what she "has to do." ?Patient asks about pain medication prescription, explained the results of the UDS and inability to prescribe pain medication per Dr. Delton Coombes. I spoke with Murray Hodgkins at [facility]. [facility] pain management clinic who states that they are willing to follow-up with the patient to manage pain and illicit drug cravings. Discussed this with patient who verbalized understanding. Encouraged the patient to call with continued questions or concerns. ?

## 2021-12-05 NOTE — Patient Instructions (Signed)
[facility]  Discharge Instructions: Thank you for choosing [facility] to provide your oncology and hematology care.  If you have a lab appointment with the Cancer Center, please come in thru the Main Entrance and check in at the main information desk.  Wear comfortable clothing and clothing appropriate for easy access to any Portacath or PICC line.   We strive to give you quality time with your provider. You may need to reschedule your appointment if you arrive late (15 or more minutes).  Arriving late affects you and other patients whose appointments are after yours.  Also, if you miss three or more appointments without notifying the office, you may be dismissed from the clinic at the provider's discretion.      For prescription refill requests, have your pharmacy contact our office and allow 72 hours for refills to be completed.    Today you received the following [place], return as scheduled.  To help prevent nausea and vomiting after your treatment, we encourage you to take your nausea medication as directed.  BELOW ARE SYMPTOMS THAT SHOULD BE REPORTED IMMEDIATELY: *FEVER GREATER THAN 100.4 F (38 C) OR HIGHER *CHILLS OR SWEATING *NAUSEA AND VOMITING THAT IS NOT CONTROLLED WITH YOUR NAUSEA MEDICATION *UNUSUAL SHORTNESS OF BREATH *UNUSUAL BRUISING OR BLEEDING *URINARY PROBLEMS (pain or burning when urinating, or frequent urination) *BOWEL PROBLEMS (unusual diarrhea, constipation, pain near the anus) TENDERNESS IN MOUTH AND THROAT WITH OR WITHOUT PRESENCE OF ULCERS (sore throat, sores in mouth, or a toothache) UNUSUAL RASH, SWELLING OR PAIN  UNUSUAL VAGINAL DISCHARGE OR ITCHING   Items with * indicate a potential emergency and should be followed up as soon as possible or go to the Emergency Department if any problems should occur.  Please show the CHEMOTHERAPY ALERT CARD or IMMUNOTHERAPY ALERT CARD at check-in to the Emergency Department and triage  nurse.  Should you have questions after your visit or need to cancel or reschedule your appointment, please contact [facility] [phone]  and follow the prompts.  Office hours are 8:00 a.m. to 4:30 p.m. Monday - Friday. Please note that voicemails left after 4:00 p.m. may not be returned until the following business day.  We are closed weekends and major holidays. You have access to a nurse at all times for urgent questions. Please call the main number to the clinic [phone] and follow the prompts.  For any non-urgent questions, you may also contact your provider using MyChart. We now offer e-Visits for anyone 18 and older to request care online for non-urgent symptoms. For details visit [email or web address].   Also download the MyChart app! Go to the app store, search "MyChart", open the app, select [facility], and log in with your MyChart username and password.  Due to Covid, a mask is required upon entering the hospital/clinic. If you do not have a mask, one will be given to you upon arrival. For doctor visits, patients may have 1 support person aged 18 or older with them. For treatment visits, patients cannot have anyone with them due to current Covid guidelines and our immunocompromised population.  

## 2021-12-05 NOTE — Progress Notes (Signed)
Patient tolerated injection with no complaints voiced. Site clean and dry with no bruising or swelling noted at site. See MAR for details. Band aid applied.  Patient stable during and after injection. VSS with discharge and left in satisfactory condition with no s/s of distress noted.  

## 2021-12-06 ENCOUNTER — Encounter: Payer: Self-pay | Admitting: Licensed Clinical Social Worker

## 2021-12-06 DIAGNOSIS — C50911 Malignant neoplasm of unspecified site of right female breast: Secondary | ICD-10-CM

## 2021-12-06 NOTE — Progress Notes (Signed)
[facility] CSW Progress Note ? ?Clinical Social Worker completed and submitted Sara [name] on [employer].  CSW to remain available to assist pt with needs as identified throughout duration of treatment. ? ? ? ?Henriette Combs , LCSW ?

## 2021-12-09 ENCOUNTER — Telehealth (HOSPITAL_COMMUNITY): Payer: Self-pay | Admitting: *Deleted

## 2021-12-09 NOTE — Telephone Encounter (Signed)
Patient called stating a severe itchy rash that came up over the weekend that is located on trunk and arms.  Describes as blistered and miserable. Advised to go to the ER for assessment.  Verbalized understanding. ?

## 2021-12-10 ENCOUNTER — Other Ambulatory Visit (HOSPITAL_COMMUNITY): Payer: Medicaid Other

## 2021-12-10 ENCOUNTER — Ambulatory Visit (HOSPITAL_COMMUNITY): Payer: Medicaid Other | Admitting: Physician Assistant

## 2021-12-18 ENCOUNTER — Encounter (HOSPITAL_COMMUNITY): Payer: Self-pay | Admitting: Hematology

## 2021-12-18 NOTE — Telephone Encounter (Signed)
Error

## 2021-12-19 ENCOUNTER — Inpatient Hospital Stay (HOSPITAL_COMMUNITY): Payer: Medicaid Other | Attending: Hematology | Admitting: Hematology

## 2021-12-19 ENCOUNTER — Inpatient Hospital Stay (HOSPITAL_COMMUNITY): Payer: Medicaid Other

## 2021-12-19 VITALS — Ht 64.02 in | Wt 121.2 lb

## 2021-12-19 DIAGNOSIS — C50111 Malignant neoplasm of central portion of right female breast: Secondary | ICD-10-CM | POA: Diagnosis present

## 2021-12-19 DIAGNOSIS — Z170 Estrogen receptor positive status [ER+]: Secondary | ICD-10-CM | POA: Diagnosis not present

## 2021-12-19 DIAGNOSIS — C787 Secondary malignant neoplasm of liver and intrahepatic bile duct: Secondary | ICD-10-CM | POA: Diagnosis not present

## 2021-12-19 DIAGNOSIS — C50911 Malignant neoplasm of unspecified site of right female breast: Secondary | ICD-10-CM

## 2021-12-19 DIAGNOSIS — C773 Secondary and unspecified malignant neoplasm of axilla and upper limb lymph nodes: Secondary | ICD-10-CM | POA: Insufficient documentation

## 2021-12-19 DIAGNOSIS — C7951 Secondary malignant neoplasm of bone: Secondary | ICD-10-CM | POA: Diagnosis not present

## 2021-12-19 DIAGNOSIS — Z5189 Encounter for other specified aftercare: Secondary | ICD-10-CM | POA: Diagnosis not present

## 2021-12-19 DIAGNOSIS — Z5111 Encounter for antineoplastic chemotherapy: Secondary | ICD-10-CM | POA: Insufficient documentation

## 2021-12-19 DIAGNOSIS — Z5112 Encounter for antineoplastic immunotherapy: Secondary | ICD-10-CM | POA: Diagnosis present

## 2021-12-19 DIAGNOSIS — Z79899 Other long term (current) drug therapy: Secondary | ICD-10-CM | POA: Diagnosis not present

## 2021-12-19 LAB — COMPREHENSIVE METABOLIC PANEL
ALT: 18 U/L (ref 0–44)
AST: 22 U/L (ref 15–41)
Albumin: 4.1 g/dL (ref 3.5–5.0)
Alkaline Phosphatase: 92 U/L (ref 38–126)
Anion gap: 9 (ref 5–15)
BUN: 13 mg/dL (ref 6–20)
CO2: 25 mmol/L (ref 22–32)
Calcium: 9.1 mg/dL (ref 8.9–10.3)
Chloride: 103 mmol/L (ref 98–111)
Creatinine, Ser: 0.67 mg/dL (ref 0.44–1.00)
GFR, Estimated: >60 mL/min (ref >60)
Glucose, Bld: 108 mg/dL — ABNORMAL HIGH (ref 70–99)
Potassium: 3.6 mmol/L (ref 3.5–5.1)
Sodium: 137 mmol/L (ref 135–145)
Total Bilirubin: 0.6 mg/dL (ref 0.3–1.2)
Total Protein: 7.8 g/dL (ref 6.5–8.1)

## 2021-12-19 LAB — CBC WITH DIFFERENTIAL/PLATELET
Abs Immature Granulocytes: 0.13 10*3/uL — ABNORMAL HIGH (ref 0.00–0.07)
Basophils Absolute: 0.1 10*3/uL (ref 0.0–0.1)
Basophils Relative: 1 %
Eosinophils Absolute: 0 10*3/uL (ref 0.0–0.5)
Eosinophils Relative: 0 %
HCT: 37.3 % (ref 36.0–46.0)
Hemoglobin: 12.1 g/dL (ref 12.0–15.0)
Immature Granulocytes: 1 %
Lymphocytes Relative: 15 %
Lymphs Abs: 1.7 10*3/uL (ref 0.7–4.0)
MCH: 30.9 pg (ref 26.0–34.0)
MCHC: 32.4 g/dL (ref 30.0–36.0)
MCV: 95.4 fL (ref 80.0–100.0)
Monocytes Absolute: 0.9 10*3/uL (ref 0.1–1.0)
Monocytes Relative: 8 %
Neutro Abs: 8.5 10*3/uL — ABNORMAL HIGH (ref 1.7–7.7)
Neutrophils Relative %: 75 %
Platelets: 194 10*3/uL (ref 150–400)
RBC: 3.91 MIL/uL (ref 3.87–5.11)
RDW: 13.3 % (ref 11.5–15.5)
WBC: 11.3 10*3/uL — ABNORMAL HIGH (ref 4.0–10.5)
nRBC: 0.6 % — ABNORMAL HIGH (ref 0.0–0.2)

## 2021-12-19 LAB — MAGNESIUM: Magnesium: 2.1 mg/dL (ref 1.7–2.4)

## 2021-12-19 MED ORDER — HEPARIN SOD (PORK) LOCK FLUSH 100 UNIT/ML IV SOLN
500.0000 [IU] | Freq: Once | INTRAVENOUS | Status: AC
  Administered 2021-12-19: 500 [IU] via INTRAVENOUS

## 2021-12-19 MED ORDER — SODIUM CHLORIDE 0.9% FLUSH
10.0000 mL | Freq: Once | INTRAVENOUS | Status: AC
  Administered 2021-12-19: 10 mL via INTRAVENOUS

## 2021-12-19 NOTE — Patient Instructions (Addendum)
[facility] at [facility] ?Discharge Instructions ? ? ?You were seen and examined today by Dr. Delton Coombes. ? ?He reviewed your lab work which is normal/stable.  ? ?Return as scheduled next week for cycle 2 of treatment.  ? ? ?Thank you for choosing [facility] at [facility] to provide your oncology and hematology care.  To afford each patient quality time with our provider, please arrive at least 15 minutes before your scheduled appointment time.  ? ?If you have a lab appointment with the [facility] please come in thru the Main Entrance and check in at the main information desk. ? ?You need to re-schedule your appointment should you arrive 10 or more minutes late.  We strive to give you quality time with our providers, and arriving late affects you and other patients whose appointments are after yours.  Also, if you no show three or more times for appointments you may be dismissed from the clinic at the providers discretion.     ?Again, thank you for choosing [facility].  Our hope is that these requests will decrease the amount of time that you wait before being seen by our physicians.       ?_____________________________________________________________ ? ?Should you have questions after your visit to [facility], please contact our office at [phone] and follow the prompts.  Our office hours are 8:00 a.m. and 4:30 p.m. Monday - Friday.  Please note that voicemails left after 4:00 p.m. may not be returned until the following business day.  We are closed weekends and major holidays.  You do have access to a nurse 24-7, just call the main number to the clinic [phone] and do not press any options, hold on the line and a nurse will answer the phone.   ? ?For prescription refill requests, have your pharmacy contact our office and allow 72 hours.   ? ?Due to Covid, you will need to wear a mask upon entering the hospital. If you do not  have a mask, a mask will be given to you at the Main Entrance upon arrival. For doctor visits, patients may have 1 support person age 72 or older with them. For treatment visits, patients can not have anyone with them due to social distancing guidelines and our immunocompromised population.  ? ?   ?

## 2021-12-19 NOTE — Progress Notes (Signed)
? ?[facility] ?[address] ?[place], [place] [address] ? ? ?Patient Care Team: ?Neale Burly, MD as PCP - General (Internal Medicine) ?Derek Jack, MD as Medical Oncologist (Medical Oncology) ?Brien Mates, RN as Oncology Nurse Navigator (Oncology) ? ?SUMMARY OF ONCOLOGIC HISTORY: ?Oncology History  ?HER2-positive carcinoma of right breast ([facility])  ?10/28/2021 Initial Diagnosis  ? Breast cancer, right breast ([facility]) ? ?  ?12/03/2021 -  Chemotherapy  ? Patient is on Treatment Plan : BREAST DOCEtaxel + Trastuzumab + Pertuzumab (THP) q21d x 8 cycles / Trastuzumab + Pertuzumab q21d x 4 cycles  ? ?  ?  ? ? ?CHIEF COMPLIANT: Follow-up of Her2+ metastatic right breast cancer ? ? ?INTERVAL HISTORY: Sara [name] is a 46 y.o. female here today for follow up of her Her2+ metastatic right breast cancer. Her last visit was on 12/03/2021.  ? ?Today she reports feeling well. She reports a rash on her feet, legs, abdomen, and hands appeared following the injection which has since resolved. She reports diarrhea, nausea, and 2 episodes of vomiting. She reports the swelling and hardness in her right breast has improved. She reports chronic tingling in her right foot which she had prior to treatment and is stable.  ? ?REVIEW OF SYSTEMS:   ?Review of Systems  ?Constitutional:  Negative for appetite change and fatigue.  ?Cardiovascular:  Positive for chest pain (breast).  ?Gastrointestinal:  Positive for diarrhea, nausea and vomiting (x2).  ?Musculoskeletal:  Positive for arthralgias (7/10 L hip and R shoulder) and back pain (7/10).  ?Skin:  Positive for rash (after injection on wrist, hands, face, legs, feet, abdomen).  ?Neurological:  Positive for headaches and numbness (feet).  ?Psychiatric/Behavioral:  Positive for depression and sleep disturbance. The patient is nervous/anxious.   ?All other systems reviewed and are negative. ? ?I have reviewed the past medical history, past surgical history, social  history and family history with the patient and they are unchanged from previous note. ? ? ?ALLERGIES:   ?is allergic to hydrocodone, sulfa antibiotics, and morphine and related. ? ? ?MEDICATIONS:  ?Current Outpatient Medications  ?Medication Sig Dispense Refill  ? ALPRAZolam (XANAX) 0.5 MG tablet Take 1 tablet (0.5 mg total) by mouth every 8 (eight) hours as needed for anxiety. Do NOT take more than prescribed amount. (Patient taking differently: Take 0.5 mg by mouth 3 (three) times daily. Do NOT take more than prescribed amount.) 42 tablet 0  ? CALCIUM PO Take 1 tablet by mouth 4 (four) times a week.    ? cholecalciferol (VITAMIN D3) 25 MCG (1000 UNIT) tablet Take 1,000 Units by mouth daily.    ? clindamycin (CLEOCIN) 300 MG capsule Take 300 mg by mouth every 6 (six) hours.    ? cloNIDine (CATAPRES) 0.1 MG tablet Take 0.1 mg by mouth at bedtime.    ? diphenoxylate-atropine (LOMOTIL) 2.5-0.025 MG tablet Take 1 tablet by mouth 4 (four) times daily as needed for diarrhea or loose stools. 60 tablet 0  ? DOCEtaxel (TAXOTERE IV) Inject into the vein every 21 ( twenty-one) days.    ? fluconazole (DIFLUCAN) 100 MG tablet Take 1 tablet (100 mg total) by mouth daily. Take 2 tablets first day, followed by one tablet daily. 5 tablet 0  ? gabapentin (NEURONTIN) 100 MG capsule Take 100 mg by mouth 3 (three) times daily.    ? hydrOXYzine (VISTARIL) 25 MG capsule Take 25 mg by mouth at bedtime.    ? mirtazapine (REMERON) 15 MG tablet  Take 7.5 mg by mouth at bedtime.    ? Multiple Vitamin (MULTIVITAMIN WITH MINERALS) TABS tablet Take 1 tablet by mouth in the morning and at bedtime.    ? Multiple Vitamins-Minerals (HAIR/SKIN/NAILS/BIOTIN PO) Take 1 tablet by mouth in the morning and at bedtime.    ? oxyCODONE-acetaminophen (PERCOCET) 7.5-325 MG tablet Take 1 tablet by mouth every 8 (eight) hours as needed for severe pain. Do NOT take more than prescribed. 42 tablet 0  ? Pertuzumab (PERJETA IV) Inject into the vein every 21 (  twenty-one) days.    ? Trastuzumab (HERCEPTIN IV) Inject into the vein every 21 ( twenty-one) days.    ? Turmeric 500 MG CAPS Take 1,000 mg by mouth daily.    ? Turmeric, Curcuma Longa, (CURCUMIN) POWD Take 1 Scoop by mouth at bedtime.    ? lidocaine-prilocaine (EMLA) cream Apply a small amount to port a cath site and cover with plastic wrap 1 hour prior to infusion appointments (Patient not taking: Reported on 12/19/2021) 30 g 3  ? prochlorperazine (COMPAZINE) 10 MG tablet Take 1 tablet (10 mg total) by mouth every 6 (six) hours as needed (Nausea or vomiting). (Patient not taking: Reported on 12/19/2021) 30 tablet 1  ? ?No current facility-administered medications for this visit.  ? ? ? ?PHYSICAL EXAMINATION: ?Performance status (ECOG): 0 - Asymptomatic ? ?There were no vitals filed for this visit. ?Wt Readings from Last 3 Encounters:  ?12/19/21 121 lb 3.2 oz (55 kg)  ?12/03/21 122 lb 6.4 oz (55.5 kg)  ?11/20/21 129 lb 12.8 oz (58.9 kg)  ? ?Physical Exam ?Vitals reviewed.  ?Constitutional:   ?   Appearance: Normal appearance.  ?Cardiovascular:  ?   Rate and Rhythm: Normal rate and regular rhythm.  ?   Pulses: Normal pulses.  ?   Heart sounds: Normal heart sounds.  ?Pulmonary:  ?   Effort: Pulmonary effort is normal.  ?   Breath sounds: Normal breath sounds.  ?Chest:  ?Breasts: ?   Right: Mass (significant improvement in size of breast mass involving all quadrants) present.  ?Neurological:  ?   General: No focal deficit present.  ?   Mental Status: She is alert and oriented to person, place, and time.  ?Psychiatric:     ?   Mood and Affect: Mood normal.     ?   Behavior: Behavior normal.  ? ? ?Breast Exam Chaperone: Thana Ates   ? ? ?LABORATORY DATA:  ?I have reviewed the data as listed ? ?  Latest Ref Rng & Units 12/19/2021  ?  8:30 AM 12/03/2021  ?  7:56 AM 10/25/2019  ? 12:24 PM  ?CMP  ?Glucose 70 - 99 mg/dL 108   127   94    ?BUN 6 - 20 mg/dL '13   17   15    ' ?Creatinine 0.44 - 1.00 mg/dL 0.67   0.83   0.84    ?Sodium  135 - 145 mmol/L 137   137   136    ?Potassium 3.5 - 5.1 mmol/L 3.6   3.2   3.9    ?Chloride 98 - 111 mmol/L 103   104   103    ?CO2 22 - 32 mmol/L '25   25   25    ' ?Calcium 8.9 - 10.3 mg/dL 9.1   8.9   9.3    ?Total Protein 6.5 - 8.1 g/dL 7.8   7.1   7.5    ?Total Bilirubin 0.3 - 1.2 mg/dL  0.6   0.5   0.6    ?Alkaline Phos 38 - 126 U/L 92   71   73    ?AST 15 - 41 U/L '22   29   20    ' ?ALT 0 - 44 U/L 18   22   47    ? ?No results found for: OVA919 ?Lab Results  ?Component Value Date  ? WBC 11.3 (H) 12/19/2021  ? HGB 12.1 12/19/2021  ? HCT 37.3 12/19/2021  ? MCV 95.4 12/19/2021  ? PLT 194 12/19/2021  ? NEUTROABS 8.5 (H) 12/19/2021  ? ? ?ASSESSMENT:  ?Her2+ metastatic breast cancer: ?- She reports feeling a knot in the right breast in July 2022.  She felt the same lump in September which has increased slightly in November.  In the last 3 months, it has increased in size from a quarter to size of an orange. ?- Mammogram/ultrasound on 10/01/2021: Large irregular mass involving the entire central right breast measuring 4.9 x 2.4 x 4 cm at 12:00.  At least 3 lymph nodes in the right axilla.  Overlying skin thickening and generalized erythema and marked tenderness. ?- Right breast mass and axillary lymph node biopsy on 10/08/2021: ?- Pathology: Poorly differentiated ductal carcinoma, grade 3, HER2 3+ positive, ER 5% strong staining intensity, PR negative, Ki-67 25%. ?- CT chest with contrast on 10/24/20[facility] at [facility]: Possible osseous metastatic disease involving left third and 12th ribs, manubrium, L1 vertebral body and left pedicle.  Enlarging 2.4 cm lesion in the central liver. ?- Physical examination shows mass and erythema involving most of the right breast, with warmth and orange peel appearance highly suggestive of inflammatory breast cancer. ?- PET scan (11/30/2021): Hypermetabolic tumor involving right breast, metastatic mediastinal and hilar lymphadenopathy, hepatic metastatic disease, diffuse lytic bone lesions. ?- Cycle 1  THP started on 12/03/2021. ? ?  ?Social/family history: ?- She lives with her boyfriend.  She has 2 daughters ages 51 and 23.  She has worked in [employer] previously.  She quit smoking 3 month

## 2021-12-24 ENCOUNTER — Ambulatory Visit (HOSPITAL_COMMUNITY): Payer: Medicaid Other | Admitting: Physician Assistant

## 2021-12-24 ENCOUNTER — Inpatient Hospital Stay (HOSPITAL_COMMUNITY): Payer: Medicaid Other

## 2021-12-24 ENCOUNTER — Encounter: Payer: Self-pay | Admitting: Licensed Clinical Social Worker

## 2021-12-24 VITALS — BP 112/71 | HR 88 | Temp 98.2°F | Resp 18

## 2021-12-24 DIAGNOSIS — Z5111 Encounter for antineoplastic chemotherapy: Secondary | ICD-10-CM | POA: Diagnosis not present

## 2021-12-24 DIAGNOSIS — C50911 Malignant neoplasm of unspecified site of right female breast: Secondary | ICD-10-CM

## 2021-12-24 LAB — CBC WITH DIFFERENTIAL/PLATELET
Abs Immature Granulocytes: 0.04 10*3/uL (ref 0.00–0.07)
Basophils Absolute: 0.1 10*3/uL (ref 0.0–0.1)
Basophils Relative: 1 %
Eosinophils Absolute: 0 10*3/uL (ref 0.0–0.5)
Eosinophils Relative: 0 %
HCT: 33.1 % — ABNORMAL LOW (ref 36.0–46.0)
Hemoglobin: 10.7 g/dL — ABNORMAL LOW (ref 12.0–15.0)
Immature Granulocytes: 1 %
Lymphocytes Relative: 34 %
Lymphs Abs: 2.1 10*3/uL (ref 0.7–4.0)
MCH: 31.2 pg (ref 26.0–34.0)
MCHC: 32.3 g/dL (ref 30.0–36.0)
MCV: 96.5 fL (ref 80.0–100.0)
Monocytes Absolute: 1.1 10*3/uL — ABNORMAL HIGH (ref 0.1–1.0)
Monocytes Relative: 17 %
Neutro Abs: 2.9 10*3/uL (ref 1.7–7.7)
Neutrophils Relative %: 47 %
Platelets: 299 10*3/uL (ref 150–400)
RBC: 3.43 MIL/uL — ABNORMAL LOW (ref 3.87–5.11)
RDW: 13.8 % (ref 11.5–15.5)
WBC: 6.2 10*3/uL (ref 4.0–10.5)
nRBC: 0 % (ref 0.0–0.2)

## 2021-12-24 LAB — COMPREHENSIVE METABOLIC PANEL
ALT: 14 U/L (ref 0–44)
AST: 22 U/L (ref 15–41)
Albumin: 3.5 g/dL (ref 3.5–5.0)
Alkaline Phosphatase: 67 U/L (ref 38–126)
Anion gap: 6 (ref 5–15)
BUN: 14 mg/dL (ref 6–20)
CO2: 26 mmol/L (ref 22–32)
Calcium: 8.6 mg/dL — ABNORMAL LOW (ref 8.9–10.3)
Chloride: 107 mmol/L (ref 98–111)
Creatinine, Ser: 0.66 mg/dL (ref 0.44–1.00)
GFR, Estimated: >60 mL/min (ref >60)
Glucose, Bld: 112 mg/dL — ABNORMAL HIGH (ref 70–99)
Potassium: 3.5 mmol/L (ref 3.5–5.1)
Sodium: 139 mmol/L (ref 135–145)
Total Bilirubin: 0.5 mg/dL (ref 0.3–1.2)
Total Protein: 6.6 g/dL (ref 6.5–8.1)

## 2021-12-24 LAB — MAGNESIUM: Magnesium: 2 mg/dL (ref 1.7–2.4)

## 2021-12-24 MED ORDER — SODIUM CHLORIDE 0.9 % IV SOLN
10.0000 mg | Freq: Once | INTRAVENOUS | Status: AC
  Administered 2021-12-24: 10 mg via INTRAVENOUS
  Filled 2021-12-24: qty 10

## 2021-12-24 MED ORDER — SODIUM CHLORIDE 0.9 % IV SOLN
420.0000 mg | Freq: Once | INTRAVENOUS | Status: AC
  Administered 2021-12-24: 420 mg via INTRAVENOUS
  Filled 2021-12-24: qty 14

## 2021-12-24 MED ORDER — ACETAMINOPHEN 325 MG PO TABS
650.0000 mg | ORAL_TABLET | Freq: Once | ORAL | Status: AC
  Administered 2021-12-24: 650 mg via ORAL
  Filled 2021-12-24: qty 2

## 2021-12-24 MED ORDER — TRASTUZUMAB-DKST CHEMO 150 MG IV SOLR
6.0000 mg/kg | Freq: Once | INTRAVENOUS | Status: AC
  Administered 2021-12-24: 357 mg via INTRAVENOUS
  Filled 2021-12-24: qty 17

## 2021-12-24 MED ORDER — PALONOSETRON HCL INJECTION 0.25 MG/5ML
0.2500 mg | Freq: Once | INTRAVENOUS | Status: AC
  Administered 2021-12-24: 0.25 mg via INTRAVENOUS
  Filled 2021-12-24: qty 5

## 2021-12-24 MED ORDER — LORAZEPAM 2 MG/ML IJ SOLN
1.0000 mg | Freq: Once | INTRAMUSCULAR | Status: AC
  Administered 2021-12-24: 1 mg via INTRAVENOUS
  Filled 2021-12-24: qty 1

## 2021-12-24 MED ORDER — SODIUM CHLORIDE 0.9 % IV SOLN
75.0000 mg/m2 | Freq: Once | INTRAVENOUS | Status: AC
  Administered 2021-12-24: 120 mg via INTRAVENOUS
  Filled 2021-12-24: qty 12

## 2021-12-24 MED ORDER — SODIUM CHLORIDE 0.9 % IV SOLN: Freq: Once | INTRAVENOUS | Status: AC

## 2021-12-24 MED ORDER — HEPARIN SOD (PORK) LOCK FLUSH 100 UNIT/ML IV SOLN
500.0000 [IU] | Freq: Once | INTRAVENOUS | Status: AC | PRN
  Administered 2021-12-24: 500 [IU]

## 2021-12-24 MED ORDER — SODIUM CHLORIDE 0.9% FLUSH
10.0000 mL | INTRAVENOUS | Status: DC | PRN
  Administered 2021-12-24: 10 mL

## 2021-12-24 MED ORDER — DIPHENHYDRAMINE HCL 25 MG PO CAPS
50.0000 mg | ORAL_CAPSULE | Freq: Once | ORAL | Status: AC
  Administered 2021-12-24: 50 mg via ORAL
  Filled 2021-12-24: qty 2

## 2021-12-24 NOTE — Progress Notes (Signed)
Patients port flushed without difficulty.  Good blood return noted with no bruising or swelling noted at site.  Stable during access and blood draw.  Patient to remain accessed for treatment. 

## 2021-12-24 NOTE — Progress Notes (Signed)
Chaplain engaged in a follow-up visit with Sara [name] from a previous visit about three weeks ago.  Sara [name] voiced that she is feeling much calmer about her diagnosis and the process she has to undergo though she still has her days of feeling very anxious.  Chaplain could assess that Sara [name] does look and feel more at peace with her current healthcare visit from previous visit.   ? ?Sara [name] spent some time talking about her family and the history of cancer in her family.  She also continues to think deeply about her healthcare journey and the many visits she had to the doctor before being diagnosed with cancer.  She also thought deeply about her relationship with her youngest daughter and the ways she sees her responding to [place] having cancer.  Sara [name] declared how committed she is to being well and getting better because there is so much for her to do and see.  ? ?Chaplain offered prayer over Sara [name] at her request.  Chaplain continues to offer support, a compassionate presence, and community.  ? ? ? 12/24/21 1000  ?Clinical Encounter Type  ?Visited With Patient  ?Visit Type Follow-up;Spiritual support  ?Spiritual Encounters  ?Spiritual Needs Prayer  ? ? ?

## 2021-12-24 NOTE — Progress Notes (Signed)
[facility] CSW Progress Note ? ?Clinical Social Worker  received a voicemail from patient requesting CSW reach out to [employer] regarding application submitted April 21st as she has not received a deposit in her account from their organization.    Email sent on behalf of patient to check status of application. ? ? ? ?Henriette Combs , LCSW ?

## 2021-12-24 NOTE — Progress Notes (Signed)
Pt presents today for Transtuzumab, Perjeta, and Docetaxel per proivider's order. Labs and vital signs WNL for treatment today. Okay to proceed with treatment today. ? ?Trastuzumab, Perjeta, and Docetaxel given today per MD orders. Tolerated infusion without adverse affects. Vital signs stable. No complaints at this time. Discharged from clinic ambulatory in stable condition. Alert and oriented x 3. F/U with [facility] as scheduled.   ?

## 2021-12-24 NOTE — Patient Instructions (Signed)
[facility]  Discharge Instructions: ?Thank you for choosing [facility] to provide your oncology and hematology care.  ?If you have a lab appointment with the [facility], please come in thru the Main Entrance and check in at the main information desk. ? ?Wear comfortable clothing and clothing appropriate for easy access to any Portacath or PICC line.  ? ?We strive to give you quality time with your provider. You may need to reschedule your appointment if you arrive late (15 or more minutes).  Arriving late affects you and other patients whose appointments are after yours.  Also, if you miss three or more appointments without notifying the office, you may be dismissed from the clinic at the provider?s discretion.    ?  ?For prescription refill requests, have your pharmacy contact our office and allow 72 hours for refills to be completed.   ? ?Today you received the following chemotherapy and/or immunotherapy agents Trastuzumab, Perjeta, and Docetaxel. ?  ?To help prevent nausea and vomiting after your treatment, we encourage you to take your nausea medication as directed. ? ?BELOW ARE SYMPTOMS THAT SHOULD BE REPORTED IMMEDIATELY: ?*FEVER GREATER THAN 100.4 F (38 ?C) OR HIGHER ?*CHILLS OR SWEATING ?*NAUSEA AND VOMITING THAT IS NOT CONTROLLED WITH YOUR NAUSEA MEDICATION ?*UNUSUAL SHORTNESS OF BREATH ?*UNUSUAL BRUISING OR BLEEDING ?*URINARY PROBLEMS (pain or burning when urinating, or frequent urination) ?*BOWEL PROBLEMS (unusual diarrhea, constipation, pain near the anus) ?TENDERNESS IN MOUTH AND THROAT WITH OR WITHOUT PRESENCE OF ULCERS (sore throat, sores in mouth, or a toothache) ?UNUSUAL RASH, SWELLING OR PAIN  ?UNUSUAL VAGINAL DISCHARGE OR ITCHING  ? ?Items with * indicate a potential emergency and should be followed up as soon as possible or go to the Emergency Department if any problems should occur. ? ?Please show the CHEMOTHERAPY ALERT CARD or IMMUNOTHERAPY ALERT CARD at check-in  to the Emergency Department and triage nurse. ? ?Should you have questions after your visit or need to cancel or reschedule your appointment, please contact [facility] [phone]  and follow the prompts.  Office hours are 8:00 a.m. to 4:30 p.m. Monday - Friday. Please note that voicemails left after 4:00 p.m. may not be returned until the following business day.  We are closed weekends and major holidays. You have access to a nurse at all times for urgent questions. Please call the main number to the clinic [phone] and follow the prompts. ? ?For any non-urgent questions, you may also contact your provider using MyChart. We now offer e-Visits for anyone 67 and older to request care online for non-urgent symptoms. For details visit [email or web address]. ?  ?Also download the MyChart app! Go to the app store, search "MyChart", open the app, select [facility], and log in with your MyChart username and password. ? ?Due to Covid, a mask is required upon entering the hospital/clinic. If you do not have a mask, one will be given to you upon arrival. For doctor visits, patients may have 1 support person aged 42 or older with them. For treatment visits, patients cannot have anyone with them due to current Covid guidelines and our immunocompromised population.  ?

## 2021-12-25 ENCOUNTER — Telehealth (HOSPITAL_COMMUNITY): Payer: Self-pay | Admitting: Hematology

## 2021-12-25 LAB — CANCER ANTIGEN 15-3: CA 15-3: 24.4 U/mL (ref 0.0–25.0)

## 2021-12-25 LAB — CANCER ANTIGEN 27.29: CA 27.29: 24.5 U/mL (ref 0.0–38.6)

## 2021-12-25 NOTE — Progress Notes (Deleted)
REFERRING PROVIDER: Derek Jack, MD [address] [place],  [place] [address]  PRIMARY PROVIDER:  Neale Burly, MD  PRIMARY REASON FOR VISIT:  1. HER2-positive carcinoma of right breast ([place])   2. Family history of breast cancer   3. Family history of colon cancer   4. Family history of lung cancer      HISTORY OF PRESENT ILLNESS:   Sara [name], a 46 y.o. female, was seen for a [facility] cancer genetics consultation at the request of Dr. Delton Coombes due to a personal and family history of cancer.  [name] presents to clinic today to discuss the possibility of a hereditary predisposition to cancer, genetic testing, and to further clarify her future cancer risks, as well as potential cancer risks for family members.   In 2023, at the age of 27, [name] was diagnosed with HER2+ right breast cancer, stage IV. The treatment plan includes chemotherapy.   CANCER HISTORY:  Oncology History  HER2-positive carcinoma of right breast ([facility])  10/28/2021 Initial Diagnosis   Breast cancer, right breast ([facility])    12/03/2021 -  Chemotherapy   Patient is on Treatment Plan : BREAST DOCEtaxel + Trastuzumab + Pertuzumab (THP) q21d x 8 cycles / Trastuzumab + Pertuzumab q21d x 4 cycles          RISK FACTORS:  Menarche was at age 72.  First live birth at age 5.  OCP use for approximately 5 years.  Ovaries intact: yes.  Hysterectomy: no.  Menopausal status: premenopausal.  HRT use: 0 years. Colonoscopy: no; not examined.   Past Medical History:  Diagnosis Date   Anxiety    Back pain     Past Surgical History:  Procedure Laterality Date   CHOLECYSTECTOMY     ORTHOPEDIC SURGERY     PORTACATH PLACEMENT Left 11/18/2021   Procedure: INSERTION PORT-A-CATH;  Surgeon: Virl Cagey, MD;  Location: [facility];  Service: General;  Laterality: Left;    FAMILY HISTORY:  We obtained a detailed, 4-generation family history.  Significant diagnoses are listed below: Family History   Problem Relation Age of Onset   Cancer Paternal Grandfather    Cancer Paternal Grandmother        lung   Heart disease Brother    Asthma Daughter    Asthma Daughter     [name] is unaware of previous family history of genetic testing for hereditary cancer risks. Patient's maternal ancestors are of *** descent, and paternal ancestors are of *** descent. There is no reported Ashkenazi Jewish ancestry. There is no known consanguinity.  GENETIC COUNSELING ASSESSMENT: [name] is a 46 y.o. female with a personal and family history of breast cancer which is somewhat suggestive of a hereditary cancer syndrome and predisposition to cancer. We, therefore, discussed and recommended the following at today's visit.   DISCUSSION: We discussed that approximately 10% of breast cancer is hereditary. Most cases of hereditary breast cancer are associated with BRCA1/BRCA2 genes, although there are other genes associated with hereditary cancer as well. Cancers and risks are gene specific. We discussed that testing is beneficial for several reasons including knowing about cancer risks, identifying potential screening and risk-reduction options that may be appropriate, and to understand if other family members could be at risk for cancer and allow them to undergo genetic testing.   We reviewed the characteristics, features and inheritance patterns of hereditary cancer syndromes. We also discussed genetic testing, including the appropriate family members to test, the process of testing,  insurance coverage and turn-around-time for results. We discussed the implications of a negative, positive and/or variant of uncertain significant result. We recommended [name] pursue genetic testing for the [employer] *** gene panel.   Based on [name]'s personal history of breast cancer at 42, she meets medical criteria for genetic testing. Despite that she meets criteria, she may still have an out of pocket cost. We discussed  that if her out of pocket cost for testing is over $100, the laboratory will call and confirm whether she wants to proceed with testing.  If the out of pocket cost of testing is less than $100 she will be billed by the genetic testing laboratory.   PLAN: After considering the risks, benefits, and limitations, [name] provided informed consent to pursue genetic testing and the blood sample was sent to [facility] for analysis of the {test}. Results should be available within approximately 2-3 weeks' time, at which point they will be disclosed by telephone to [name], as will any additional recommendations warranted by these results. [name] will receive a summary of her genetic counseling visit and a copy of her results once available. This information will also be available in Epic.   *** Despite our recommendation, [name] did not wish to pursue genetic testing at today's visit. We understand this decision and remain available to coordinate genetic testing at any time in the future. We, therefore, recommend [name] continue to follow the cancer screening guidelines given by her primary healthcare provider.  [name] questions were answered to her satisfaction today. Our contact information was provided should additional questions or concerns arise. Thank you for the referral and allowing [place] to share in the care of your patient.   Faith Rogue, MS, [facility] Genetic Counselor [email or web address] Phone: [phone]  The patient was seen for a total of *** minutes in face-to-face genetic counseling.  Dr. Grayland Ormond was available for discussion regarding this case.   _______________________________________________________________________ For Office Staff:  Number of people involved in session: *** Was an Intern/ student involved with case: no

## 2021-12-26 ENCOUNTER — Inpatient Hospital Stay (HOSPITAL_COMMUNITY): Payer: Medicaid Other | Admitting: Licensed Clinical Social Worker

## 2021-12-26 ENCOUNTER — Inpatient Hospital Stay (HOSPITAL_COMMUNITY): Payer: Medicaid Other

## 2021-12-26 VITALS — BP 116/84 | HR 78 | Temp 97.6°F | Resp 18

## 2021-12-26 DIAGNOSIS — Z803 Family history of malignant neoplasm of breast: Secondary | ICD-10-CM

## 2021-12-26 DIAGNOSIS — Z5111 Encounter for antineoplastic chemotherapy: Secondary | ICD-10-CM | POA: Diagnosis not present

## 2021-12-26 DIAGNOSIS — Z801 Family history of malignant neoplasm of trachea, bronchus and lung: Secondary | ICD-10-CM

## 2021-12-26 DIAGNOSIS — C50911 Malignant neoplasm of unspecified site of right female breast: Secondary | ICD-10-CM

## 2021-12-26 DIAGNOSIS — Z800 Family history of malignant neoplasm of digestive organs: Secondary | ICD-10-CM

## 2021-12-26 MED ORDER — PEGFILGRASTIM-CBQV 6 MG/0.6ML ~~LOC~~ SOSY
6.0000 mg | PREFILLED_SYRINGE | Freq: Once | SUBCUTANEOUS | Status: AC
  Administered 2021-12-26: 6 mg via SUBCUTANEOUS
  Filled 2021-12-26: qty 0.6

## 2021-12-26 MED ORDER — MAGIC MOUTHWASH W/LIDOCAINE: ORAL | 2 refills | Status: DC

## 2021-12-26 NOTE — Patient Instructions (Signed)
[facility]  Discharge Instructions: Thank you for choosing [facility] to provide your oncology and hematology care.  If you have a lab appointment with the Cancer Center, please come in thru the Main Entrance and check in at the main information desk.  Wear comfortable clothing and clothing appropriate for easy access to any Portacath or PICC line.   We strive to give you quality time with your provider. You may need to reschedule your appointment if you arrive late (15 or more minutes).  Arriving late affects you and other patients whose appointments are after yours.  Also, if you miss three or more appointments without notifying the office, you may be dismissed from the clinic at the provider's discretion.      For prescription refill requests, have your pharmacy contact our office and allow 72 hours for refills to be completed.    Today you received Udenyca injection.     BELOW ARE SYMPTOMS THAT SHOULD BE REPORTED IMMEDIATELY: *FEVER GREATER THAN 100.4 F (38 C) OR HIGHER *CHILLS OR SWEATING *NAUSEA AND VOMITING THAT IS NOT CONTROLLED WITH YOUR NAUSEA MEDICATION *UNUSUAL SHORTNESS OF BREATH *UNUSUAL BRUISING OR BLEEDING *URINARY PROBLEMS (pain or burning when urinating, or frequent urination) *BOWEL PROBLEMS (unusual diarrhea, constipation, pain near the anus) TENDERNESS IN MOUTH AND THROAT WITH OR WITHOUT PRESENCE OF ULCERS (sore throat, sores in mouth, or a toothache) UNUSUAL RASH, SWELLING OR PAIN  UNUSUAL VAGINAL DISCHARGE OR ITCHING   Items with * indicate a potential emergency and should be followed up as soon as possible or go to the Emergency Department if any problems should occur.  Please show the CHEMOTHERAPY ALERT CARD or IMMUNOTHERAPY ALERT CARD at check-in to the Emergency Department and triage nurse.  Should you have questions after your visit or need to cancel or reschedule your appointment, please contact [facility]  [phone]  and follow the prompts.  Office hours are 8:00 a.m. to 4:30 p.m. Monday - Friday. Please note that voicemails left after 4:00 p.m. may not be returned until the following business day.  We are closed weekends and major holidays. You have access to a nurse at all times for urgent questions. Please call the main number to the clinic [phone] and follow the prompts.  For any non-urgent questions, you may also contact your provider using MyChart. We now offer e-Visits for anyone 18 and older to request care online for non-urgent symptoms. For details visit [email or web address].   Also download the MyChart app! Go to the app store, search "MyChart", open the app, select [facility], and log in with your MyChart username and password.  Due to Covid, a mask is required upon entering the hospital/clinic. If you do not have a mask, one will be given to you upon arrival. For doctor visits, patients may have 1 support person aged 18 or older with them. For treatment visits, patients cannot have anyone with them due to current Covid guidelines and our immunocompromised population.  

## 2021-12-26 NOTE — Progress Notes (Signed)
Sara [name] presents today for injection per the provider's orders.  Udeynca  administration without incident; injection site WNL; see MAR for injection details.  Patient tolerated procedure well and without incident.  No questions or complaints noted at this time.  ? ?Pt c/o a sore mouth, magic mouthwash send into [facility].  ? ?Discharged from clinic ambulatory in stable condition. Alert and oriented x 3. F/U with [facility] as scheduled.   ?

## 2022-01-07 ENCOUNTER — Telehealth (HOSPITAL_COMMUNITY): Payer: Self-pay | Admitting: Hematology

## 2022-01-07 NOTE — Telephone Encounter (Signed)
Spoke to pt in regards to [place]. She is eligible. Advised her to bring in a letter of support from her [employer]. Pt will bring in the letter asap.

## 2022-01-14 ENCOUNTER — Inpatient Hospital Stay (HOSPITAL_COMMUNITY): Payer: Medicaid Other | Admitting: Hematology

## 2022-01-14 ENCOUNTER — Inpatient Hospital Stay (HOSPITAL_COMMUNITY): Payer: Medicaid Other

## 2022-01-16 ENCOUNTER — Encounter (HOSPITAL_COMMUNITY): Payer: Self-pay | Admitting: Hematology

## 2022-01-16 ENCOUNTER — Inpatient Hospital Stay (HOSPITAL_COMMUNITY): Payer: Medicaid Other | Attending: Hematology | Admitting: Dietician

## 2022-01-16 ENCOUNTER — Inpatient Hospital Stay (HOSPITAL_COMMUNITY): Payer: Medicaid Other

## 2022-01-16 DIAGNOSIS — Z170 Estrogen receptor positive status [ER+]: Secondary | ICD-10-CM | POA: Insufficient documentation

## 2022-01-16 DIAGNOSIS — C50911 Malignant neoplasm of unspecified site of right female breast: Secondary | ICD-10-CM | POA: Insufficient documentation

## 2022-01-16 DIAGNOSIS — Z5111 Encounter for antineoplastic chemotherapy: Secondary | ICD-10-CM | POA: Insufficient documentation

## 2022-01-16 DIAGNOSIS — Z79899 Other long term (current) drug therapy: Secondary | ICD-10-CM | POA: Insufficient documentation

## 2022-01-16 DIAGNOSIS — Z5112 Encounter for antineoplastic immunotherapy: Secondary | ICD-10-CM | POA: Insufficient documentation

## 2022-01-16 DIAGNOSIS — C787 Secondary malignant neoplasm of liver and intrahepatic bile duct: Secondary | ICD-10-CM | POA: Insufficient documentation

## 2022-01-16 DIAGNOSIS — C7951 Secondary malignant neoplasm of bone: Secondary | ICD-10-CM | POA: Insufficient documentation

## 2022-01-23 ENCOUNTER — Inpatient Hospital Stay (HOSPITAL_COMMUNITY): Payer: Medicaid Other | Admitting: Licensed Clinical Social Worker

## 2022-02-04 ENCOUNTER — Inpatient Hospital Stay (HOSPITAL_COMMUNITY): Payer: Medicaid Other

## 2022-02-04 ENCOUNTER — Inpatient Hospital Stay (HOSPITAL_BASED_OUTPATIENT_CLINIC_OR_DEPARTMENT_OTHER): Payer: Medicaid Other | Admitting: Hematology

## 2022-02-04 ENCOUNTER — Other Ambulatory Visit (HOSPITAL_COMMUNITY): Payer: Self-pay | Admitting: *Deleted

## 2022-02-04 ENCOUNTER — Ambulatory Visit (HOSPITAL_COMMUNITY)
Admission: RE | Admit: 2022-02-04 | Discharge: 2022-02-04 | Disposition: A | Discharge status: Home / Self Care | Payer: Medicaid Other | Source: Ambulatory Visit | Attending: Hematology | Admitting: Hematology

## 2022-02-04 ENCOUNTER — Other Ambulatory Visit (HOSPITAL_COMMUNITY): Payer: Self-pay | Admitting: Hematology

## 2022-02-04 VITALS — BP 135/85 | HR 85 | Temp 98.0°F | Resp 19 | Wt 112.2 lb

## 2022-02-04 VITALS — BP 139/88 | HR 70 | Temp 97.8°F | Resp 18

## 2022-02-04 DIAGNOSIS — C50911 Malignant neoplasm of unspecified site of right female breast: Secondary | ICD-10-CM | POA: Diagnosis not present

## 2022-02-04 DIAGNOSIS — C50111 Malignant neoplasm of central portion of right female breast: Secondary | ICD-10-CM | POA: Insufficient documentation

## 2022-02-04 DIAGNOSIS — Z170 Estrogen receptor positive status [ER+]: Secondary | ICD-10-CM | POA: Insufficient documentation

## 2022-02-04 DIAGNOSIS — Z79899 Other long term (current) drug therapy: Secondary | ICD-10-CM | POA: Diagnosis not present

## 2022-02-04 DIAGNOSIS — C7951 Secondary malignant neoplasm of bone: Secondary | ICD-10-CM | POA: Diagnosis not present

## 2022-02-04 DIAGNOSIS — Z5111 Encounter for antineoplastic chemotherapy: Secondary | ICD-10-CM | POA: Diagnosis present

## 2022-02-04 DIAGNOSIS — Z5112 Encounter for antineoplastic immunotherapy: Secondary | ICD-10-CM | POA: Diagnosis present

## 2022-02-04 DIAGNOSIS — C787 Secondary malignant neoplasm of liver and intrahepatic bile duct: Secondary | ICD-10-CM | POA: Diagnosis not present

## 2022-02-04 LAB — CBC WITH DIFFERENTIAL/PLATELET
Abs Immature Granulocytes: 0.02 10*3/uL (ref 0.00–0.07)
Basophils Absolute: 0 10*3/uL (ref 0.0–0.1)
Basophils Relative: 0 %
Eosinophils Absolute: 0.1 10*3/uL (ref 0.0–0.5)
Eosinophils Relative: 2 %
HCT: 35.9 % — ABNORMAL LOW (ref 36.0–46.0)
Hemoglobin: 11.8 g/dL — ABNORMAL LOW (ref 12.0–15.0)
Immature Granulocytes: 0 %
Lymphocytes Relative: 22 %
Lymphs Abs: 1.8 10*3/uL (ref 0.7–4.0)
MCH: 31.9 pg (ref 26.0–34.0)
MCHC: 32.9 g/dL (ref 30.0–36.0)
MCV: 97 fL (ref 80.0–100.0)
Monocytes Absolute: 0.6 10*3/uL (ref 0.1–1.0)
Monocytes Relative: 7 %
Neutro Abs: 5.6 10*3/uL (ref 1.7–7.7)
Neutrophils Relative %: 69 %
Platelets: 186 10*3/uL (ref 150–400)
RBC: 3.7 MIL/uL — ABNORMAL LOW (ref 3.87–5.11)
RDW: 14.3 % (ref 11.5–15.5)
WBC: 8.1 10*3/uL (ref 4.0–10.5)
nRBC: 0 % (ref 0.0–0.2)

## 2022-02-04 LAB — COMPREHENSIVE METABOLIC PANEL
ALT: 13 U/L (ref 0–44)
AST: 20 U/L (ref 15–41)
Albumin: 3.9 g/dL (ref 3.5–5.0)
Alkaline Phosphatase: 44 U/L (ref 38–126)
Anion gap: 6 (ref 5–15)
BUN: 19 mg/dL (ref 6–20)
CO2: 27 mmol/L (ref 22–32)
Calcium: 9.2 mg/dL (ref 8.9–10.3)
Chloride: 106 mmol/L (ref 98–111)
Creatinine, Ser: 0.75 mg/dL (ref 0.44–1.00)
GFR, Estimated: >60 mL/min (ref >60)
Glucose, Bld: 91 mg/dL (ref 70–99)
Potassium: 3.8 mmol/L (ref 3.5–5.1)
Sodium: 139 mmol/L (ref 135–145)
Total Bilirubin: 0.5 mg/dL (ref 0.3–1.2)
Total Protein: 7.1 g/dL (ref 6.5–8.1)

## 2022-02-04 LAB — MAGNESIUM: Magnesium: 2.1 mg/dL (ref 1.7–2.4)

## 2022-02-04 MED ORDER — HEPARIN SOD (PORK) LOCK FLUSH 100 UNIT/ML IV SOLN
500.0000 [IU] | Freq: Once | INTRAVENOUS | Status: AC | PRN
  Administered 2022-02-04: 500 [IU]

## 2022-02-04 MED ORDER — SODIUM CHLORIDE 0.9% FLUSH
10.0000 mL | INTRAVENOUS | Status: DC | PRN
  Administered 2022-02-04: 10 mL

## 2022-02-04 MED ORDER — SODIUM CHLORIDE 0.9 % IV SOLN: Freq: Once | INTRAVENOUS | Status: AC

## 2022-02-04 MED ORDER — TRASTUZUMAB-DKST CHEMO 150 MG IV SOLR
315.0000 mg | Freq: Once | INTRAVENOUS | Status: AC
  Administered 2022-02-04: 315 mg via INTRAVENOUS
  Filled 2022-02-04: qty 15

## 2022-02-04 MED ORDER — SODIUM CHLORIDE 0.9 % IV SOLN
75.0000 mg/m2 | Freq: Once | INTRAVENOUS | Status: AC
  Administered 2022-02-04: 120 mg via INTRAVENOUS
  Filled 2022-02-04: qty 12

## 2022-02-04 MED ORDER — DIPHENHYDRAMINE HCL 25 MG PO CAPS
50.0000 mg | ORAL_CAPSULE | Freq: Once | ORAL | Status: AC
  Administered 2022-02-04: 50 mg via ORAL
  Filled 2022-02-04: qty 2

## 2022-02-04 MED ORDER — SODIUM CHLORIDE 0.9 % IV SOLN
10.0000 mg | Freq: Once | INTRAVENOUS | Status: AC
  Administered 2022-02-04: 10 mg via INTRAVENOUS
  Filled 2022-02-04: qty 10

## 2022-02-04 MED ORDER — SODIUM CHLORIDE 0.9 % IV SOLN
420.0000 mg | Freq: Once | INTRAVENOUS | Status: AC
  Administered 2022-02-04: 420 mg via INTRAVENOUS
  Filled 2022-02-04: qty 14

## 2022-02-04 MED ORDER — MAGIC MOUTHWASH W/LIDOCAINE: ORAL | 2 refills | Status: DC

## 2022-02-04 MED ORDER — LORAZEPAM 2 MG/ML IJ SOLN
1.0000 mg | Freq: Once | INTRAMUSCULAR | Status: AC
  Administered 2022-02-04: 1 mg via INTRAVENOUS
  Filled 2022-02-04: qty 1

## 2022-02-04 MED ORDER — ACETAMINOPHEN 325 MG PO TABS
650.0000 mg | ORAL_TABLET | Freq: Once | ORAL | Status: AC
  Administered 2022-02-04: 650 mg via ORAL
  Filled 2022-02-04: qty 2

## 2022-02-04 MED ORDER — PALONOSETRON HCL INJECTION 0.25 MG/5ML
0.2500 mg | Freq: Once | INTRAVENOUS | Status: AC
  Administered 2022-02-04: 0.25 mg via INTRAVENOUS
  Filled 2022-02-04: qty 5

## 2022-02-04 NOTE — Telephone Encounter (Signed)
Refill called in for magic mouthwash to [facility] in [place] with 3 refills.

## 2022-02-04 NOTE — Patient Instructions (Addendum)
[facility] at [facility] Discharge Instructions   You were seen and examined today by Dr. Delton Coombes.  He reviewed the results of your lab work which is normal/stable.   We will proceed with your treatment today.   We will discontinue the white blood cell booster shot.   Return as scheduled in 3 weeks.    Thank you for choosing [facility] at [facility] to provide your oncology and hematology care.  To afford each patient quality time with our provider, please arrive at least 15 minutes before your scheduled appointment time.   If you have a lab appointment with the [facility] please come in thru the Main Entrance and check in at the main information desk.  You need to re-schedule your appointment should you arrive 10 or more minutes late.  We strive to give you quality time with our providers, and arriving late affects you and other patients whose appointments are after yours.  Also, if you no show three or more times for appointments you may be dismissed from the clinic at the providers discretion.     Again, thank you for choosing [facility].  Our hope is that these requests will decrease the amount of time that you wait before being seen by our physicians.       _____________________________________________________________  Should you have questions after your visit to [facility], please contact our office at [phone] and follow the prompts.  Our office hours are 8:00 a.m. and 4:30 p.m. Monday - Friday.  Please note that voicemails left after 4:00 p.m. may not be returned until the following business day.  We are closed weekends and major holidays.  You do have access to a nurse 24-7, just call the main number to the clinic [phone] and do not press any options, hold on the line and a nurse will answer the phone.    For prescription refill requests, have your pharmacy contact our office and allow 72  hours.    Due to Covid, you will need to wear a mask upon entering the hospital. If you do not have a mask, a mask will be given to you at the Main Entrance upon arrival. For doctor visits, patients may have 1 support person age 75 or older with them. For treatment visits, patients can not have anyone with them due to social distancing guidelines and our immunocompromised population.

## 2022-02-04 NOTE — Progress Notes (Signed)
Sara [name], [place] [address]   CLINIC:  Medical Oncology/Hematology  PCP:  Neale Burly, MD [place] / [place] [id number] [phone]   REASON FOR VISIT:  Follow-up for Her2+ metastatic right breast cancer  PRIOR THERAPY: none  NGS Results: not done  CURRENT THERAPY: DOCEtaxel + Trastuzumab + Pertuzumab (THP) q21d x 8 cycles / Trastuzumab + Pertuzumab q21d x 4 cycles  BRIEF ONCOLOGIC HISTORY:  Oncology History  HER2-positive carcinoma of right breast ([facility])  10/28/2021 Initial Diagnosis   Breast cancer, right breast ([facility])   12/03/2021 -  Chemotherapy   Patient is on Treatment Plan : BREAST DOCEtaxel + Trastuzumab + Pertuzumab (THP) q21d x 8 cycles / Trastuzumab + Pertuzumab q21d x 4 cycles       CANCER STAGING:  Cancer Staging  HER2-positive carcinoma of right breast ([facility]) Staging form: Breast, AJCC 8th Edition - Clinical stage from 10/28/2021: Stage IV (cT4d, cN1, cM1, G3, ER+, PR-, HER2+) - Unsigned   INTERVAL HISTORY:  Ms. Sara [name], a 46 y.o. female, returns for routine follow-up and consideration for next cycle of chemotherapy. Deshonna was last seen on 12/03/2021.  Due for cycle #3 of THP today.   Overall, she tells me she has been feeling pretty well. She reports fatigue, leg pains, rash, itching, mouth sores, and 2-4 days of diarrhea after treatment. She reports 2 episodes of tingling in her right foot, and she reports she had had similar episodes of tingling prior to start of treatment. She reports she is active, and she denies increased fatigue. She denies nausea and vomiting.   Overall, she feels ready for next cycle of chemo today.    REVIEW OF SYSTEMS:  Review of Systems  Constitutional:  Negative for appetite change and fatigue.  Gastrointestinal:  Negative for nausea and vomiting.  Neurological:  Positive for numbness (x2).  Psychiatric/Behavioral:  Positive for depression and sleep disturbance.  The patient is nervous/anxious.   All other systems reviewed and are negative.   PAST MEDICAL/SURGICAL HISTORY:  Past Medical History:  Diagnosis Date   Anxiety    Back pain    Past Surgical History:  Procedure Laterality Date   CHOLECYSTECTOMY     ORTHOPEDIC SURGERY     PORTACATH PLACEMENT Left 11/18/2021   Procedure: INSERTION PORT-A-CATH;  Surgeon: Sara Cagey, MD;  Location: [facility];  Service: General;  Laterality: Left;    SOCIAL HISTORY:  Social History   Socioeconomic History   Marital status: Legally Separated    Spouse name: Not on file   Number of children: Not on file   Years of education: Not on file   Highest education level: Not on file  Occupational History   Not on file  Tobacco Use   Smoking status: Former    Packs/day: 1.00    Years: 5.00    Total pack years: 5.00    Types: Cigarettes    Quit date: 11/08/2021    Years since quitting: 0.2   Smokeless tobacco: Never  Vaping Use   Vaping Use: Never used  Substance and Sexual Activity   Alcohol use: No    Comment: occasionally   Drug use: No   Sexual activity: Yes    Birth control/protection: None  Other Topics Concern   Not on file  Social History Narrative   Not on file   Social Determinants of Health   Financial Resource Strain: High Risk (12/03/2021)   Overall  Financial Resource Strain (CARDIA)    Difficulty of Paying Living Expenses: Very hard  Food Insecurity: No Food Insecurity (06/17/2021)   Hunger Vital Sign    Worried About Running Out of Food in the Last Year: Never true    [place] in the Last Year: Never true  Transportation Needs: Unmet Transportation Needs (12/03/2021)   PRAPARE - Transportation    Lack of Transportation (Medical): Yes    Lack of Transportation (Non-Medical): Yes  Physical Activity: Sufficiently Active (06/17/2021)   Exercise Vital Sign    Days of Exercise per Week: 5 days    Minutes of Exercise per Session: 30 min  Stress: Stress Concern Present  (06/17/2021)   [place]    Feeling of Stress : Rather much  Social Connections: Socially Isolated (06/17/2021)   Social Connection and Isolation Panel [NHANES]    Frequency of Communication with Friends and Family: Twice a week    Frequency of Social Gatherings with Friends and Family: Never    Attends Religious Services: 1 to 4 times per year    Active Member of [employer] or Organizations: No    Attends Archivist Meetings: Never    Marital Status: Divorced  Human resources officer Violence: Not At Risk (06/17/2021)   Humiliation, Afraid, Rape, and Kick questionnaire    Fear of Current or Ex-Partner: No    Emotionally Abused: No    Physically Abused: No    Sexually Abused: No    FAMILY HISTORY:  Family History  Problem Relation Age of Onset   Cancer Paternal Grandfather    Cancer Paternal Grandmother        lung   Heart disease Brother    Asthma Daughter    Asthma Daughter     CURRENT MEDICATIONS:  Current Outpatient Medications  Medication Sig Dispense Refill   ALPRAZolam (XANAX) 0.5 MG tablet Take 1 tablet (0.5 mg total) by mouth every 8 (eight) hours as needed for anxiety. Do NOT take more than prescribed amount. (Patient taking differently: Take 0.5 mg by mouth 3 (three) times daily. Do NOT take more than prescribed amount.) 42 tablet 0   CALCIUM PO Take 1 tablet by mouth 4 (four) times a week.     cholecalciferol (VITAMIN D3) 25 MCG (1000 UNIT) tablet Take 1,000 Units by mouth daily.     clindamycin (CLEOCIN) 300 MG capsule Take 300 mg by mouth every 6 (six) hours.     cloNIDine (CATAPRES) 0.1 MG tablet Take 0.1 mg by mouth at bedtime.     diphenoxylate-atropine (LOMOTIL) 2.5-0.025 MG tablet Take 1 tablet by mouth 4 (four) times daily as needed for diarrhea or loose stools. 60 tablet 0   DOCEtaxel (TAXOTERE IV) Inject into the vein every 21 ( twenty-one) days.     fluconazole (DIFLUCAN) 100 MG tablet Take  1 tablet (100 mg total) by mouth daily. Take 2 tablets first day, followed by one tablet daily. 5 tablet 0   gabapentin (NEURONTIN) 100 MG capsule Take 100 mg by mouth 3 (three) times daily.     hydrOXYzine (VISTARIL) 25 MG capsule Take 25 mg by mouth at bedtime.     lidocaine-prilocaine (EMLA) cream Apply a small amount to port a cath site and cover with plastic wrap 1 hour prior to infusion appointments 30 g 3   magic mouthwash w/lidocaine SOLN Swish and swallow 1 tablespoon four times daily. If the sores/pain are just on the tongue, amy swish  and spit. If the sores/pain are in the back of the throat and mouth, patient must swish and swallow. 360 mL 2   mirtazapine (REMERON) 15 MG tablet Take 7.5 mg by mouth at bedtime.     Multiple Vitamin (MULTIVITAMIN WITH MINERALS) TABS tablet Take 1 tablet by mouth in the morning and at bedtime.     Multiple Vitamins-Minerals (HAIR/SKIN/NAILS/BIOTIN PO) Take 1 tablet by mouth in the morning and at bedtime.     oxyCODONE-acetaminophen (PERCOCET) 7.5-325 MG tablet Take 1 tablet by mouth every 8 (eight) hours as needed for severe pain. Do NOT take more than prescribed. 42 tablet 0   Pertuzumab (PERJETA IV) Inject into the vein every 21 ( twenty-one) days.     prochlorperazine (COMPAZINE) 10 MG tablet Take 1 tablet (10 mg total) by mouth every 6 (six) hours as needed (Nausea or vomiting). 30 tablet 1   Trastuzumab (HERCEPTIN IV) Inject into the vein every 21 ( twenty-one) days.     Turmeric 500 MG CAPS Take 1,000 mg by mouth daily.     Turmeric, Curcuma Longa, (CURCUMIN) POWD Take 1 Scoop by mouth at bedtime.     No current facility-administered medications for this visit.    ALLERGIES:  Allergies  Allergen Reactions   Hydrocodone Nausea And Vomiting   Sulfa Antibiotics Nausea And Vomiting   Morphine And Related Rash    PHYSICAL EXAM:  Performance status (ECOG): 0 - Asymptomatic  There were no vitals filed for this visit. Wt Readings from Last 3  Encounters:  12/24/21 121 lb 6.4 oz (55.1 kg)  12/19/21 121 lb 3.2 oz (55 kg)  12/03/21 122 lb 6.4 oz (55.5 kg)   Physical Exam Vitals reviewed.  Constitutional:      Appearance: Normal appearance.  HENT:     Mouth/Throat:     Mouth: No oral lesions.     Comments: No thrush present Cardiovascular:     Rate and Rhythm: Normal rate and regular rhythm.     Pulses: Normal pulses.     Heart sounds: Normal heart sounds.  Pulmonary:     Effort: Pulmonary effort is normal.     Breath sounds: Normal breath sounds.  Neurological:     General: No focal deficit present.     Mental Status: She is alert and oriented to person, place, and time.  Psychiatric:        Mood and Affect: Mood normal.        Behavior: Behavior normal.     LABORATORY DATA:  I have reviewed the labs as listed.     Latest Ref Rng & Units 12/24/2021    7:57 AM 12/19/2021    8:30 AM 12/03/2021    7:56 AM  CBC  WBC 4.0 - 10.5 K/uL 6.2  11.3  8.0   Hemoglobin 12.0 - 15.0 g/dL 10.7  12.1  12.4   Hematocrit 36.0 - 46.0 % 33.1  37.3  36.9   Platelets 150 - 400 K/uL 299  194  259       Latest Ref Rng & Units 12/24/2021    7:57 AM 12/19/2021    8:30 AM 12/03/2021    7:56 AM  CMP  Glucose 70 - 99 mg/dL 112  108  127   BUN 6 - 20 mg/dL _0 Creatinine 0.44 - 1.00 mg/dL 0.66  0.67  0.83   Sodium 135 - 145 mmol/L 139  137  137   Potassium 3.5 - 5.1  mmol/L 3.5  3.6  3.2   Chloride 98 - 111 mmol/L 107  103  104   CO2 22 - 32 mmol/L _0 Calcium 8.9 - 10.3 mg/dL 8.6  9.1  8.9   Total Protein 6.5 - 8.1 g/dL 6.6  7.8  7.1   Total Bilirubin 0.3 - 1.2 mg/dL 0.5  0.6  0.5   Alkaline Phos 38 - 126 U/L 67  92  71   AST 15 - 41 U/L _1 ALT 0 - 44 U/L _2 DIAGNOSTIC IMAGING:  I have independently reviewed the scans and discussed with the patient. No results found.   ASSESSMENT:  Her2+ metastatic breast cancer: - She reports feeling a knot in the right breast in July 2022.  She felt the  same lump in September which has increased slightly in November.  In the last 3 months, it has increased in size from a quarter to size of an orange. - Mammogram/ultrasound on 10/01/2021: Large irregular mass involving the entire central right breast measuring 4.9 x 2.4 x 4 cm at 12:00.  At least 3 lymph nodes in the right axilla.  Overlying skin thickening and generalized erythema and marked tenderness. - Right breast mass and axillary lymph node biopsy on 10/08/2021: - Pathology: Poorly differentiated ductal carcinoma, grade 3, HER2 3+ positive, ER 5% strong staining intensity, PR negative, Ki-67 25%. - CT chest with contrast on 10/24/[facility]: Possible osseous metastatic disease involving left third and 12th ribs, manubrium, L1 vertebral body and left pedicle.  Enlarging 2.4 cm lesion in the central liver. - Physical examination shows mass and erythema involving most of the right breast, with warmth and orange peel appearance highly suggestive of inflammatory breast cancer. - PET scan (11/30/2021): Hypermetabolic tumor involving right breast, metastatic mediastinal and hilar lymphadenopathy, hepatic metastatic disease, diffuse lytic bone lesions. - Cycle 1 THP started on 12/03/2021.    Social/family history: - She lives with her boyfriend.  She has 2 daughters ages 56 and 76.  She has worked in Programmer, applications previously.  She quit smoking 3 months ago.  Prior to that she smoked a pack a day for 4 to 5 years and less than half pack a day since 2011.  She vapes occasionally at this time. - Paternal great grandmother had lung cancer.  Paternal aunt had lung cancer at age 44.  Paternal grandmother had breast cancer in her 38s.  Paternal grandfather had colon cancer.  Maternal grandfather had colon cancer.   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 - She reported a lot of social issues which delayed her cycle  3. - She had leg pains 4 to 5 days after Neulasta injection lasted couple of weeks.  She also had a rash and itching after Neulasta injection.  Other than that she had mouth sores lasting few days and diarrhea for 2 to 4 days after last treatment.  Also reported right foot shooting pain which happened only 1 time. - Reviewed labs today which showed normal LFTs and creatinine.  CBC was grossly normal.  Tumor markers on 12/24/2021 were normal. - I have recommended discontinuing long-acting G-CSF as it is causing more side effects.  I have recommended her to go to the ER if she develops any temperature of more  than 100.5 throughout the course of treatments. - We will proceed with cycle 3 today.  RTC 3 weeks for follow-up.  Plan to repeat CT CAP and bone scan and tumor markers at next visit.  2.  Right breast pain: - This has improved since start of therapy.  She is following up with pain clinic.  If she wants to get pain medicine from our clinic, she needs to be negative on urine drug screen.  I will consider giving her hydrocodone 5/325 2-3 times daily 1 week at a time based on the urine drug screen, if she is not able to follow-up with pain clinic.  3.  Anxiety: - Continue Xanax daily as needed.  4.  Bone metastasis: - We however discussed denosumab to decrease skeletal related events. - She has dental work pending.  Continue calcium and vitamin D in the meanwhile. - Reviewed x-ray of the left hip from today which showed femoral neck lytic lesion with no evidence of fracture or destruction.   Orders placed this encounter:  No orders of the defined types were placed in this encounter.    Derek Jack, MD [place] [phone]   I, Thana Ates, am acting as a scribe for Dr. Derek Jack.  I, Derek Jack MD, have reviewed the above documentation for accuracy and completeness, and I agree with the above.

## 2022-02-04 NOTE — Progress Notes (Signed)
Chaplain engaged in a follow-up visit with Sara [name].  She described the many transitions she has undergone and how hard they have been on her mental health.  It has been hard for Sara [name] to see herself losing weight and losing her hair.  She also has found it hard to be sick due to chemo.  She took some time off from receiving chemo because of how she feels afterwards.  Sara [name] voiced today her desire to live and have more time.  She wants to continuo on with treatment so that she can continue to live.  Chaplain affirmed the many hard transitions Sara [name] has experienced in her appearance changing, health changing, and her living situation changing.  Chaplain also worked to think about the possibilities of the future and what it would mean for those things to change in time.  Sara [name] overall seemed to be doing much better mentally by the end of the visit.  Chaplain reached out to Social Work for support as Sara [name] has been working on receiving more resources.   Chaplain offered reflective listening, support, and presence.    02/04/22 1400  Clinical Encounter Type  Visited With Patient  Visit Type Follow-up

## 2022-02-04 NOTE — Patient Instructions (Signed)
[name]  Discharge Instructions: Thank you for choosing [facility] to provide your oncology and hematology care.  If you have a lab appointment with the [facility], please come in thru the Main Entrance and check in at the main information desk.  Wear comfortable clothing and clothing appropriate for easy access to any Portacath or PICC line.   We strive to give you quality time with your provider. You may need to reschedule your appointment if you arrive late (15 or more minutes).  Arriving late affects you and other patients whose appointments are after yours.  Also, if you miss three or more appointments without notifying the office, you may be dismissed from the clinic at the provider's discretion.      For prescription refill requests, have your pharmacy contact our office and allow 72 hours for refills to be completed.    Today you received the following chemotherapy and/or immunotherapy agents ogiviri, perjeta and taxotere.  Trastuzumab injection for infusion What is this medication? TRASTUZUMAB (tras TOO zoo mab) is a monoclonal antibody. It is used to treat breast cancer and stomach cancer. This medicine may be used for other purposes; ask your health care provider or pharmacist if you have questions. COMMON BRAND NAME(S): Herceptin, Janae Bridgeman, Ontruzant, Trazimera What should I tell my care team before I take this medication? They need to know if you have any of these conditions: heart disease heart failure lung or breathing disease, like asthma an unusual or allergic reaction to trastuzumab, benzyl alcohol, or other medications, foods, dyes, or preservatives pregnant or trying to get pregnant breast-feeding How should I use this medication? This drug is given as an infusion into a vein. It is administered in a hospital or clinic by a specially trained health care professional. Talk to your pediatrician regarding the use of this  medicine in children. This medicine is not approved for use in children. Overdosage: If you think you have taken too much of this medicine contact a poison control center or emergency room at once. NOTE: This medicine is only for you. Do not share this medicine with others. What if I miss a dose? It is important not to miss a dose. Call your doctor or health care professional if you are unable to keep an appointment. What may interact with this medication? This medicine may interact with the following medications: certain types of chemotherapy, such as daunorubicin, doxorubicin, epirubicin, and idarubicin This list may not describe all possible interactions. Give your health care provider a list of all the medicines, herbs, non-prescription drugs, or dietary supplements you use. Also tell them if you smoke, drink alcohol, or use illegal drugs. Some items may interact with your medicine. What should I watch for while using this medication? Visit your doctor for checks on your progress. Report any side effects. Continue your course of treatment even though you feel ill unless your doctor tells you to stop. Call your doctor or health care professional for advice if you get a fever, chills or sore throat, or other symptoms of a cold or flu. Do not treat yourself. Try to avoid being around people who are sick. You may experience fever, chills and shaking during your first infusion. These effects are usually mild and can be treated with other medicines. Report any side effects during the infusion to your health care professional. Fever and chills usually do not happen with later infusions. Do not become pregnant while taking this medicine or  for 7 months after stopping it. Women should inform their doctor if they wish to become pregnant or think they might be pregnant. Women of child-bearing potential will need to have a negative pregnancy test before starting this medicine. There is a potential for serious  side effects to an unborn child. Talk to your health care professional or pharmacist for more information. Do not breast-feed an infant while taking this medicine or for 7 months after stopping it. Women must use effective birth control with this medicine. What side effects may I notice from receiving this medication? Side effects that you should report to your doctor or health care professional as soon as possible: allergic reactions like skin rash, itching or hives, swelling of the face, lips, or tongue chest pain or palpitations cough dizziness feeling faint or lightheaded, falls fever general ill feeling or flu-like symptoms signs of worsening heart failure like breathing problems; swelling in your legs and feet unusually weak or tired Side effects that usually do not require medical attention (report to your doctor or health care professional if they continue or are bothersome): bone pain changes in taste diarrhea joint pain nausea/vomiting weight loss This list may not describe all possible side effects. Call your doctor for medical advice about side effects. You may report side effects to FDA at 1-800-FDA-1088. Where should I keep my medication? This drug is given in a hospital or clinic and will not be stored at home. NOTE: This sheet is a summary. It may not cover all possible information. If you have questions about this medicine, talk to your doctor, pharmacist, or health care provider.  2023 Elsevier/Gold Standard (2016-08-19 00:00:00) Pertuzumab injection What is this medication? PERTUZUMAB (per TOOZ ue mab) is a monoclonal antibody. It is used to treat breast cancer. This medicine may be used for other purposes; ask your health care provider or pharmacist if you have questions. COMMON BRAND NAME(S): PERJETA What should I tell my care team before I take this medication? They need to know if you have any of these conditions: heart disease heart failure high blood  pressure history of irregular heart beat recent or ongoing radiation therapy an unusual or allergic reaction to pertuzumab, other medicines, foods, dyes, or preservatives pregnant or trying to get pregnant breast-feeding How should I use this medication? This medicine is for infusion into a vein. It is given by a health care professional in a hospital or clinic setting. Talk to your pediatrician regarding the use of this medicine in children. Special care may be needed. Overdosage: If you think you have taken too much of this medicine contact a poison control center or emergency room at once. NOTE: This medicine is only for you. Do not share this medicine with others. What if I miss a dose? It is important not to miss your dose. Call your doctor or health care professional if you are unable to keep an appointment. What may interact with this medication? Interactions are not expected. Give your health care provider a list of all the medicines, herbs, non-prescription drugs, or dietary supplements you use. Also tell them if you smoke, drink alcohol, or use illegal drugs. Some items may interact with your medicine. This list may not describe all possible interactions. Give your health care provider a list of all the medicines, herbs, non-prescription drugs, or dietary supplements you use. Also tell them if you smoke, drink alcohol, or use illegal drugs. Some items may interact with your medicine. What should I watch for while  using this medication? Your condition will be monitored carefully while you are receiving this medicine. Report any side effects. Continue your course of treatment even though you feel ill unless your doctor tells you to stop. Do not become pregnant while taking this medicine or for 7 months after stopping it. Women should inform their doctor if they wish to become pregnant or think they might be pregnant. Women of child-bearing potential will need to have a negative pregnancy  test before starting this medicine. There is a potential for serious side effects to an unborn child. Talk to your health care professional or pharmacist for more information. Do not breast-feed an infant while taking this medicine or for 7 months after stopping it. Women must use effective birth control with this medicine. Call your doctor or health care professional for advice if you get a fever, chills or sore throat, or other symptoms of a cold or flu. Do not treat yourself. Try to avoid being around people who are sick. You may experience fever, chills, and headache during the infusion. Report any side effects during the infusion to your health care professional. What side effects may I notice from receiving this medication? Side effects that you should report to your doctor or health care professional as soon as possible: breathing problems chest pain or palpitations dizziness feeling faint or lightheaded fever or chills skin rash, itching or hives sore throat swelling of the face, lips, or tongue swelling of the legs or ankles unusually weak or tired Side effects that usually do not require medical attention (report to your doctor or health care professional if they continue or are bothersome): diarrhea hair loss nausea, vomiting tiredness This list may not describe all possible side effects. Call your doctor for medical advice about side effects. You may report side effects to FDA at 1-800-FDA-1088. Where should I keep my medication? This drug is given in a hospital or clinic and will not be stored at home. NOTE: This sheet is a summary. It may not cover all possible information. If you have questions about this medicine, talk to your doctor, pharmacist, or health care provider.  2023 Elsevier/Gold Standard (2015-09-06 00:00:00) Docetaxel injection What is this medication? DOCETAXEL (doe se TAX el) is a chemotherapy drug. It targets fast dividing cells, like cancer cells, and  causes these cells to die. This medicine is used to treat many types of cancers like breast cancer, certain stomach cancers, head and neck cancer, lung cancer, and prostate cancer. This medicine may be used for other purposes; ask your health care provider or pharmacist if you have questions. COMMON BRAND NAME(S): Docefrez, Taxotere What should I tell my care team before I take this medication? They need to know if you have any of these conditions: infection (especially a virus infection such as chickenpox, cold sores, or herpes) liver disease low blood counts, like low white cell, platelet, or red cell counts an unusual or allergic reaction to docetaxel, polysorbate 80, other chemotherapy agents, other medicines, foods, dyes, or preservatives pregnant or trying to get pregnant breast-feeding How should I use this medication? This drug is given as an infusion into a vein. It is administered in a hospital or clinic by a specially trained health care professional. Talk to your pediatrician regarding the use of this medicine in children. Special care may be needed. Overdosage: If you think you have taken too much of this medicine contact a poison control center or emergency room at once. NOTE: This medicine is only  for you. Do not share this medicine with others. What if I miss a dose? It is important not to miss your dose. Call your doctor or health care professional if you are unable to keep an appointment. What may interact with this medication? Do not take this medicine with any of the following medications: live virus vaccines This medicine may also interact with the following medications: aprepitant certain antibiotics like erythromycin or clarithromycin certain antivirals for HIV or hepatitis certain medicines for fungal infections like fluconazole, itraconazole, ketoconazole, posaconazole, or  voriconazole cimetidine ciprofloxacin conivaptan cyclosporine dronedarone fluvoxamine grapefruit juice imatinib verapamil This list may not describe all possible interactions. Give your health care provider a list of all the medicines, herbs, non-prescription drugs, or dietary supplements you use. Also tell them if you smoke, drink alcohol, or use illegal drugs. Some items may interact with your medicine. What should I watch for while using this medication? Your condition will be monitored carefully while you are receiving this medicine. You will need important blood work done while you are taking this medicine. Call your doctor or health care professional for advice if you get a fever, chills or sore throat, or other symptoms of a cold or flu. Do not treat yourself. This drug decreases your body's ability to fight infections. Try to avoid being around people who are sick. Some products may contain alcohol. Ask your health care professional if this medicine contains alcohol. Be sure to tell all health care professionals you are taking this medicine. Certain medicines, like metronidazole and disulfiram, can cause an unpleasant reaction when taken with alcohol. The reaction includes flushing, headache, nausea, vomiting, sweating, and increased thirst. The reaction can last from 30 minutes to several hours. You may get drowsy or dizzy. Do not drive, use machinery, or do anything that needs mental alertness until you know how this medicine affects you. Do not stand or sit up quickly, especially if you are an older patient. This reduces the risk of dizzy or fainting spells. Alcohol may interfere with the effect of this medicine. Talk to your health care professional about your risk of cancer. You may be more at risk for certain types of cancer if you take this medicine. Do not become pregnant while taking this medicine or for 6 months after stopping it. Women should inform their doctor if they wish to  become pregnant or think they might be pregnant. There is a potential for serious side effects to an unborn child. Talk to your health care professional or pharmacist for more information. Do not breast-feed an infant while taking this medicine or for 1 week after stopping it. Males who get this medicine must use a condom during sex with females who can get pregnant. If you get a woman pregnant, the baby could have birth defects. The baby could die before they are born. You will need to continue wearing a condom for 3 months after stopping the medicine. Tell your health care provider right away if your partner becomes pregnant while you are taking this medicine. This may interfere with the ability to father a child. You should talk to your doctor or health care professional if you are concerned about your fertility. What side effects may I notice from receiving this medication? Side effects that you should report to your doctor or health care professional as soon as possible: allergic reactions like skin rash, itching or hives, swelling of the face, lips, or tongue blurred vision breathing problems changes in vision low blood  counts - This drug may decrease the number of white blood cells, red blood cells and platelets. You may be at increased risk for infections and bleeding. nausea and vomiting pain, redness or irritation at site where injected pain, tingling, numbness in the hands or feet redness, blistering, peeling, or loosening of the skin, including inside the mouth signs of decreased platelets or bleeding - bruising, pinpoint red spots on the skin, black, tarry stools, nosebleeds signs of decreased red blood cells - unusually weak or tired, fainting spells, lightheadedness signs of infection - fever or chills, cough, sore throat, pain or difficulty passing urine swelling of the ankle, feet, hands Side effects that usually do not require medical attention (report to your doctor or health  care professional if they continue or are bothersome): constipation diarrhea fingernail or toenail changes hair loss loss of appetite mouth sores muscle pain This list may not describe all possible side effects. Call your doctor for medical advice about side effects. You may report side effects to FDA at 1-800-FDA-1088. Where should I keep my medication? This drug is given in a hospital or clinic and will not be stored at home. NOTE: This sheet is a summary. It may not cover all possible information. If you have questions about this medicine, talk to your doctor, pharmacist, or health care provider.  2023 Elsevier/Gold Standard (2021-07-05 00:00:00)       To help prevent nausea and vomiting after your treatment, we encourage you to take your nausea medication as directed.  BELOW ARE SYMPTOMS THAT SHOULD BE REPORTED IMMEDIATELY: *FEVER GREATER THAN 100.4 F (38 C) OR HIGHER *CHILLS OR SWEATING *NAUSEA AND VOMITING THAT IS NOT CONTROLLED WITH YOUR NAUSEA MEDICATION *UNUSUAL SHORTNESS OF BREATH *UNUSUAL BRUISING OR BLEEDING *URINARY PROBLEMS (pain or burning when urinating, or frequent urination) *BOWEL PROBLEMS (unusual diarrhea, constipation, pain near the anus) TENDERNESS IN MOUTH AND THROAT WITH OR WITHOUT PRESENCE OF ULCERS (sore throat, sores in mouth, or a toothache) UNUSUAL RASH, SWELLING OR PAIN  UNUSUAL VAGINAL DISCHARGE OR ITCHING   Items with * indicate a potential emergency and should be followed up as soon as possible or go to the Emergency Department if any problems should occur.  Please show the CHEMOTHERAPY ALERT CARD or IMMUNOTHERAPY ALERT CARD at check-in to the Emergency Department and triage nurse.  Should you have questions after your visit or need to cancel or reschedule your appointment, please contact [facility] [phone]  and follow the prompts.  Office hours are 8:00 a.m. to 4:30 p.m. Monday - Friday. Please note that voicemails left after  4:00 p.m. may not be returned until the following business day.  We are closed weekends and major holidays. You have access to a nurse at all times for urgent questions. Please call the main number to the clinic [phone] and follow the prompts.  For any non-urgent questions, you may also contact your provider using MyChart. We now offer e-Visits for anyone 80 and older to request care online for non-urgent symptoms. For details visit [email or web address].   Also download the MyChart app! Go to the app store, search "MyChart", open the app, select [facility], and log in with your MyChart username and password.  Masks are optional in the cancer centers. If you would like for your care team to wear a mask while they are taking care of you, please let them know. For doctor visits, patients may have with them one support person who is at least 45 years old. At  this time, visitors are not allowed in the infusion area.

## 2022-02-04 NOTE — Progress Notes (Signed)
Patient tolerated chemotherapy with no complaints voiced.  Side effects with management reviewed with understanding verbalized.  Port site clean and dry with no bruising or swelling noted at site.  Good blood return noted before and after administration of chemotherapy.  Band aid applied.  Patient left in satisfactory condition with VSS and no s/s of distress noted.   

## 2022-02-05 ENCOUNTER — Other Ambulatory Visit (HOSPITAL_COMMUNITY): Payer: Self-pay | Admitting: *Deleted

## 2022-02-05 DIAGNOSIS — C50911 Malignant neoplasm of unspecified site of right female breast: Secondary | ICD-10-CM

## 2022-02-05 LAB — CANCER ANTIGEN 15-3: CA 15-3: 11.7 U/mL (ref 0.0–25.0)

## 2022-02-05 LAB — CANCER ANTIGEN 27.29: CA 27.29: 19.5 U/mL (ref 0.0–38.6)

## 2022-02-05 MED ORDER — DIPHENOXYLATE-ATROPINE 2.5-0.025 MG PO TABS: 1.0000 | ORAL_TABLET | Freq: Four times a day (QID) | ORAL | 0 refills | Status: DC | PRN

## 2022-02-05 MED ORDER — PROCHLORPERAZINE MALEATE 10 MG PO TABS: 10.0000 mg | ORAL_TABLET | Freq: Four times a day (QID) | ORAL | 1 refills | Status: DC | PRN

## 2022-02-05 MED ORDER — ONDANSETRON HCL 8 MG PO TABS: 8.0000 mg | ORAL_TABLET | Freq: Three times a day (TID) | ORAL | 0 refills | Status: DC | PRN

## 2022-02-06 ENCOUNTER — Inpatient Hospital Stay (HOSPITAL_COMMUNITY): Payer: Medicaid Other

## 2022-02-06 ENCOUNTER — Other Ambulatory Visit (HOSPITAL_COMMUNITY): Payer: Medicaid Other

## 2022-02-19 ENCOUNTER — Encounter (HOSPITAL_COMMUNITY): Payer: Medicaid Other

## 2022-02-19 ENCOUNTER — Encounter (HOSPITAL_COMMUNITY): Admission: RE | Admit: 2022-02-19 | Payer: Medicaid Other | Source: Ambulatory Visit

## 2022-02-24 ENCOUNTER — Encounter (HOSPITAL_COMMUNITY): Payer: Medicaid Other

## 2022-02-24 ENCOUNTER — Ambulatory Visit (HOSPITAL_COMMUNITY): Admission: RE | Admit: 2022-02-24 | Payer: Medicaid Other | Source: Ambulatory Visit

## 2022-02-25 ENCOUNTER — Inpatient Hospital Stay (HOSPITAL_COMMUNITY): Payer: Medicaid Other | Attending: Hematology

## 2022-02-25 ENCOUNTER — Inpatient Hospital Stay (HOSPITAL_BASED_OUTPATIENT_CLINIC_OR_DEPARTMENT_OTHER): Payer: Medicaid Other | Admitting: Hematology

## 2022-02-25 ENCOUNTER — Inpatient Hospital Stay (HOSPITAL_COMMUNITY): Payer: Medicaid Other

## 2022-02-25 VITALS — BP 113/77 | HR 91 | Temp 98.4°F | Resp 18

## 2022-02-25 DIAGNOSIS — C787 Secondary malignant neoplasm of liver and intrahepatic bile duct: Secondary | ICD-10-CM | POA: Insufficient documentation

## 2022-02-25 DIAGNOSIS — Z79899 Other long term (current) drug therapy: Secondary | ICD-10-CM | POA: Insufficient documentation

## 2022-02-25 DIAGNOSIS — C50111 Malignant neoplasm of central portion of right female breast: Secondary | ICD-10-CM | POA: Diagnosis present

## 2022-02-25 DIAGNOSIS — Z5112 Encounter for antineoplastic immunotherapy: Secondary | ICD-10-CM | POA: Insufficient documentation

## 2022-02-25 DIAGNOSIS — C50911 Malignant neoplasm of unspecified site of right female breast: Secondary | ICD-10-CM

## 2022-02-25 DIAGNOSIS — Z170 Estrogen receptor positive status [ER+]: Secondary | ICD-10-CM | POA: Insufficient documentation

## 2022-02-25 DIAGNOSIS — C7951 Secondary malignant neoplasm of bone: Secondary | ICD-10-CM | POA: Diagnosis not present

## 2022-02-25 DIAGNOSIS — Z5111 Encounter for antineoplastic chemotherapy: Secondary | ICD-10-CM | POA: Insufficient documentation

## 2022-02-25 LAB — COMPREHENSIVE METABOLIC PANEL
ALT: 16 U/L (ref 0–44)
AST: 22 U/L (ref 15–41)
Albumin: 4.1 g/dL (ref 3.5–5.0)
Alkaline Phosphatase: 47 U/L (ref 38–126)
Anion gap: 4 — ABNORMAL LOW (ref 5–15)
BUN: 16 mg/dL (ref 6–20)
CO2: 27 mmol/L (ref 22–32)
Calcium: 9.3 mg/dL (ref 8.9–10.3)
Chloride: 108 mmol/L (ref 98–111)
Creatinine, Ser: 0.73 mg/dL (ref 0.44–1.00)
GFR, Estimated: >60 mL/min (ref >60)
Glucose, Bld: 89 mg/dL (ref 70–99)
Potassium: 3.5 mmol/L (ref 3.5–5.1)
Sodium: 139 mmol/L (ref 135–145)
Total Bilirubin: 0.5 mg/dL (ref 0.3–1.2)
Total Protein: 7.4 g/dL (ref 6.5–8.1)

## 2022-02-25 LAB — CBC WITH DIFFERENTIAL/PLATELET
Abs Immature Granulocytes: 0.08 10*3/uL — ABNORMAL HIGH (ref 0.00–0.07)
Basophils Absolute: 0.1 10*3/uL (ref 0.0–0.1)
Basophils Relative: 0 %
Eosinophils Absolute: 0 10*3/uL (ref 0.0–0.5)
Eosinophils Relative: 0 %
HCT: 37.4 % (ref 36.0–46.0)
Hemoglobin: 12.4 g/dL (ref 12.0–15.0)
Immature Granulocytes: 1 %
Lymphocytes Relative: 12 %
Lymphs Abs: 1.6 10*3/uL (ref 0.7–4.0)
MCH: 31.5 pg (ref 26.0–34.0)
MCHC: 33.2 g/dL (ref 30.0–36.0)
MCV: 94.9 fL (ref 80.0–100.0)
Monocytes Absolute: 0.8 10*3/uL (ref 0.1–1.0)
Monocytes Relative: 6 %
Neutro Abs: 10.8 10*3/uL — ABNORMAL HIGH (ref 1.7–7.7)
Neutrophils Relative %: 81 %
Platelets: 284 10*3/uL (ref 150–400)
RBC: 3.94 MIL/uL (ref 3.87–5.11)
RDW: 13.7 % (ref 11.5–15.5)
WBC: 13.4 10*3/uL — ABNORMAL HIGH (ref 4.0–10.5)
nRBC: 0 % (ref 0.0–0.2)

## 2022-02-25 LAB — MAGNESIUM: Magnesium: 2 mg/dL (ref 1.7–2.4)

## 2022-02-25 MED ORDER — SODIUM CHLORIDE 0.9 % IV SOLN: Freq: Once | INTRAVENOUS | Status: AC

## 2022-02-25 MED ORDER — LORAZEPAM 2 MG/ML IJ SOLN
1.0000 mg | Freq: Once | INTRAMUSCULAR | Status: AC
  Administered 2022-02-25: 1 mg via INTRAVENOUS
  Filled 2022-02-25: qty 1

## 2022-02-25 MED ORDER — DIPHENHYDRAMINE HCL 25 MG PO CAPS
50.0000 mg | ORAL_CAPSULE | Freq: Once | ORAL | Status: AC
  Administered 2022-02-25: 50 mg via ORAL
  Filled 2022-02-25: qty 2

## 2022-02-25 MED ORDER — PALONOSETRON HCL INJECTION 0.25 MG/5ML
0.2500 mg | Freq: Once | INTRAVENOUS | Status: AC
  Administered 2022-02-25: 0.25 mg via INTRAVENOUS
  Filled 2022-02-25: qty 5

## 2022-02-25 MED ORDER — TRASTUZUMAB-DKST CHEMO 150 MG IV SOLR
6.0000 mg/kg | Freq: Once | INTRAVENOUS | Status: AC
  Administered 2022-02-25: 315 mg via INTRAVENOUS
  Filled 2022-02-25: qty 15

## 2022-02-25 MED ORDER — SODIUM CHLORIDE 0.9 % IV SOLN
420.0000 mg | Freq: Once | INTRAVENOUS | Status: AC
  Administered 2022-02-25: 420 mg via INTRAVENOUS
  Filled 2022-02-25: qty 14

## 2022-02-25 MED ORDER — ACETAMINOPHEN 325 MG PO TABS
650.0000 mg | ORAL_TABLET | Freq: Once | ORAL | Status: AC
  Administered 2022-02-25: 650 mg via ORAL
  Filled 2022-02-25: qty 2

## 2022-02-25 MED ORDER — SODIUM CHLORIDE 0.9 % IV SOLN
10.0000 mg | Freq: Once | INTRAVENOUS | Status: AC
  Administered 2022-02-25: 10 mg via INTRAVENOUS
  Filled 2022-02-25: qty 10

## 2022-02-25 MED ORDER — SODIUM CHLORIDE 0.9 % IV SOLN
75.0000 mg/m2 | Freq: Once | INTRAVENOUS | Status: AC
  Administered 2022-02-25: 120 mg via INTRAVENOUS
  Filled 2022-02-25: qty 12

## 2022-02-25 MED ORDER — SODIUM CHLORIDE 0.9% FLUSH
10.0000 mL | INTRAVENOUS | Status: DC | PRN
  Administered 2022-02-25: 10 mL

## 2022-02-25 MED ORDER — HEPARIN SOD (PORK) LOCK FLUSH 100 UNIT/ML IV SOLN
500.0000 [IU] | Freq: Once | INTRAVENOUS | Status: AC | PRN
  Administered 2022-02-25: 500 [IU]

## 2022-02-25 NOTE — Patient Instructions (Signed)
[facility]  Discharge Instructions: Thank you for choosing [facility] to provide your oncology and hematology care.  If you have a lab appointment with the [facility], please come in thru the Main Entrance and check in at the main information desk.  Wear comfortable clothing and clothing appropriate for easy access to any Portacath or PICC line.   We strive to give you quality time with your provider. You may need to reschedule your appointment if you arrive late (15 or more minutes).  Arriving late affects you and other patients whose appointments are after yours.  Also, if you miss three or more appointments without notifying the office, you may be dismissed from the clinic at the provider's discretion.      For prescription refill requests, have your pharmacy contact our office and allow 72 hours for refills to be completed.    Today you received the following chemotherapy and/or immunotherapy agents OGIVRI Taxotere and Perjeta      To help prevent nausea and vomiting after your treatment, we encourage you to take your nausea medication as directed.  BELOW ARE SYMPTOMS THAT SHOULD BE REPORTED IMMEDIATELY: *FEVER GREATER THAN 100.4 F (38 C) OR HIGHER *CHILLS OR SWEATING *NAUSEA AND VOMITING THAT IS NOT CONTROLLED WITH YOUR NAUSEA MEDICATION *UNUSUAL SHORTNESS OF BREATH *UNUSUAL BRUISING OR BLEEDING *URINARY PROBLEMS (pain or burning when urinating, or frequent urination) *BOWEL PROBLEMS (unusual diarrhea, constipation, pain near the anus) TENDERNESS IN MOUTH AND THROAT WITH OR WITHOUT PRESENCE OF ULCERS (sore throat, sores in mouth, or a toothache) UNUSUAL RASH, SWELLING OR PAIN  UNUSUAL VAGINAL DISCHARGE OR ITCHING   Items with * indicate a potential emergency and should be followed up as soon as possible or go to the Emergency Department if any problems should occur.  Please show the CHEMOTHERAPY ALERT CARD or IMMUNOTHERAPY ALERT CARD at check-in to  the Emergency Department and triage nurse.  Should you have questions after your visit or need to cancel or reschedule your appointment, please contact [facility] [phone]  and follow the prompts.  Office hours are 8:00 a.m. to 4:30 p.m. Monday - Friday. Please note that voicemails left after 4:00 p.m. may not be returned until the following business day.  We are closed weekends and major holidays. You have access to a nurse at all times for urgent questions. Please call the main number to the clinic [phone] and follow the prompts.  For any non-urgent questions, you may also contact your provider using MyChart. We now offer e-Visits for anyone 45 and older to request care online for non-urgent symptoms. For details visit [email or web address].   Also download the MyChart app! Go to the app store, search "MyChart", open the app, select [facility], and log in with your MyChart username and password.  Masks are optional in the cancer centers. If you would like for your care team to wear a mask while they are taking care of you, please let them know. For doctor visits, patients may have with them one support person who is at least 45 years old. At this time, visitors are not allowed in the infusion area.

## 2022-02-25 NOTE — Patient Instructions (Addendum)
[facility] at [facility] Discharge Instructions   You were seen and examined today by Dr. Katragadda.  He reviewed the results of your lab work which are normal/stable.   We will proceed with your treatment today.  Return as scheduled.    Thank you for choosing [facility] at [facility] to provide your oncology and hematology care.  To afford each patient quality time with our provider, please arrive at least 15 minutes before your scheduled appointment time.   If you have a lab appointment with the Cancer Center please come in thru the Main Entrance and check in at the main information desk.  You need to re-schedule your appointment should you arrive 10 or more minutes late.  We strive to give you quality time with our providers, and arriving late affects you and other patients whose appointments are after yours.  Also, if you no show three or more times for appointments you may be dismissed from the clinic at the providers discretion.     Again, thank you for choosing [facility].  Our hope is that these requests will decrease the amount of time that you wait before being seen by our physicians.       _____________________________________________________________  Should you have questions after your visit to [facility], please contact our office at [phone] and follow the prompts.  Our office hours are 8:00 a.m. and 4:30 p.m. Monday - Friday.  Please note that voicemails left after 4:00 p.m. may not be returned until the following business day.  We are closed weekends and major holidays.  You do have access to a nurse 24-7, just call the main number to the clinic [phone] and do not press any options, hold on the line and a nurse will answer the phone.    For prescription refill requests, have your pharmacy contact our office and allow 72 hours.    Due to Covid, you will need to wear a mask upon entering  the hospital. If you do not have a mask, a mask will be given to you at the Main Entrance upon arrival. For doctor visits, patients may have 1 support person age 18 or older with them. For treatment visits, patients can not have anyone with them due to social distancing guidelines and our immunocompromised population.      

## 2022-02-25 NOTE — Progress Notes (Signed)
Sara [name], [place] [address]   CLINIC:  Medical Oncology/Hematology  PCP:  Sara Burly, MD [place] / [place] [address] [phone]   REASON FOR VISIT:  Follow-up for Her2+ metastatic right breast cancer  PRIOR THERAPY: none  NGS Results: not done  CURRENT THERAPY: DOCEtaxel + Trastuzumab + Pertuzumab (THP) q21d x 8 cycles / Trastuzumab + Pertuzumab q21d x 4 cycles  BRIEF ONCOLOGIC HISTORY:  Oncology History  HER2-positive carcinoma of right breast ([place])  10/28/2021 Initial Diagnosis   Breast cancer, right breast ([place])   12/03/2021 -  Chemotherapy   Patient is on Treatment Plan : BREAST DOCEtaxel + Trastuzumab + Pertuzumab (THP) q21d x 8 cycles / Trastuzumab + Pertuzumab q21d x 4 cycles       CANCER STAGING:  Cancer Staging  HER2-positive carcinoma of right breast ([place]) Staging form: Breast, AJCC 8th Edition - Clinical stage from 10/28/2021: Stage IV (cT4d, cN1, cM1, G3, ER+, PR-, HER2+) - Unsigned   INTERVAL HISTORY:  Ms. Sara [name], a 46 y.o. female, returns for routine follow-up and consideration for next cycle of chemotherapy. Sara [name] was last seen on 02/04/2022.  Due for cycle #4 of THP today.   Overall, she tells me she has been feeling pretty well. She denies vomiting, and she reports nausea and mouth sores which have resolved. She has diarrhea for 3-4 days starting 3 days after treatment which is helped with Imodium and lomotil. She denies SOB and orthopnea. Her appetite is poor. She has lost 5 lbs since her last visit. She reports she is drinking 1-2 Ensure daily. She denies tingling/numbness.  Overall, she feels ready for next cycle of chemo today.    REVIEW OF SYSTEMS:  Review of Systems  Constitutional:  Positive for appetite change and unexpected weight change (-5 lbs). Negative for fatigue.  HENT:   Negative for mouth sores (resolved).   Respiratory:  Negative for shortness of breath.    Gastrointestinal:  Positive for diarrhea. Negative for nausea (resolved) and vomiting.  Musculoskeletal:  Positive for back pain.  Neurological:  Negative for numbness.  Psychiatric/Behavioral:  Positive for sleep disturbance.   All other systems reviewed and are negative.   PAST MEDICAL/SURGICAL HISTORY:  Past Medical History:  Diagnosis Date   Anxiety    Back pain    Past Surgical History:  Procedure Laterality Date   CHOLECYSTECTOMY     ORTHOPEDIC SURGERY     PORTACATH PLACEMENT Left 11/18/2021   Procedure: INSERTION PORT-A-CATH;  Surgeon: Virl Cagey, MD;  Location: [facility];  Service: General;  Laterality: Left;    SOCIAL HISTORY:  Social History   Socioeconomic History   Marital status: Legally Separated    Spouse name: Not on file   Number of children: Not on file   Years of education: Not on file   Highest education level: Not on file  Occupational History   Not on file  Tobacco Use   Smoking status: Former    Packs/day: 1.00    Years: 5.00    Total pack years: 5.00    Types: Cigarettes    Quit date: 11/08/2021    Years since quitting: 0.2   Smokeless tobacco: Never  Vaping Use   Vaping Use: Never used  Substance and Sexual Activity   Alcohol use: No    Comment: occasionally   Drug use: No   Sexual activity: Yes    Birth control/protection: None  Other  Topics Concern   Not on file  Social History Narrative   Not on file   Social Determinants of Health   Financial Resource Strain: High Risk (12/03/2021)   Overall Financial Resource Strain (CARDIA)    Difficulty of Paying Living Expenses: Very hard  Food Insecurity: No Food Insecurity (06/17/2021)   Hunger Vital Sign    Worried About Running Out of Food in the Last Year: Never true    [place] in the Last Year: Never true  Transportation Needs: Unmet Transportation Needs (12/03/2021)   PRAPARE - Hydrologist (Medical): Yes    Lack of Transportation  (Non-Medical): Yes  Physical Activity: Sufficiently Active (06/17/2021)   Exercise Vital Sign    Days of Exercise per Week: 5 days    Minutes of Exercise per Session: 30 min  Stress: Stress Concern Present (06/17/2021)   [place]    Feeling of Stress : Rather much  Social Connections: Socially Isolated (06/17/2021)   Social Connection and Isolation Panel [NHANES]    Frequency of Communication with Friends and Family: Twice a week    Frequency of Social Gatherings with Friends and Family: Never    Attends Religious Services: 1 to 4 times per year    Active Member of [employer] or Organizations: No    Attends Archivist Meetings: Never    Marital Status: Divorced  Human resources officer Violence: Not At Risk (06/17/2021)   Humiliation, Afraid, Rape, and Kick questionnaire    Fear of Current or Ex-Partner: No    Emotionally Abused: No    Physically Abused: No    Sexually Abused: No    FAMILY HISTORY:  Family History  Problem Relation Age of Onset   Cancer Paternal Grandfather    Cancer Paternal Grandmother        lung   Heart disease Brother    Asthma Daughter    Asthma Daughter     CURRENT MEDICATIONS:  Current Outpatient Medications  Medication Sig Dispense Refill   baclofen (LIORESAL) 10 MG tablet Take 10 mg by mouth 3 (three) times daily as needed.     CALCIUM PO Take 1 tablet by mouth 4 (four) times a week.     cholecalciferol (VITAMIN D3) 25 MCG (1000 UNIT) tablet Take 1,000 Units by mouth daily.     clindamycin (CLEOCIN) 300 MG capsule Take 300 mg by mouth every 6 (six) hours.     cloNIDine (CATAPRES) 0.1 MG tablet Take 0.1 mg by mouth at bedtime.     diphenoxylate-atropine (LOMOTIL) 2.5-0.025 MG tablet Take 1 tablet by mouth 4 (four) times daily as needed for diarrhea or loose stools. 60 tablet 0   DOCEtaxel (TAXOTERE IV) Inject into the vein every 21 ( twenty-one) days.     fluconazole (DIFLUCAN) 100  MG tablet Take 1 tablet (100 mg total) by mouth daily. Take 2 tablets first day, followed by one tablet daily. 5 tablet 0   gabapentin (NEURONTIN) 100 MG capsule Take 100 mg by mouth 3 (three) times daily.     hydrOXYzine (VISTARIL) 25 MG capsule Take 25 mg by mouth at bedtime.     magic mouthwash w/lidocaine SOLN Swish and swallow 1 tablespoon four times daily. If the sores/pain are just on the tongue, amy swish and spit. If the sores/pain are in the back of the throat and mouth, patient must swish and swallow. 360 mL 2   mirtazapine (REMERON) 15  MG tablet Take 7.5 mg by mouth at bedtime.     Multiple Vitamin (MULTIVITAMIN WITH MINERALS) TABS tablet Take 1 tablet by mouth in the morning and at bedtime.     Multiple Vitamins-Minerals (HAIR/SKIN/NAILS/BIOTIN PO) Take 1 tablet by mouth in the morning and at bedtime.     Pertuzumab (PERJETA IV) Inject into the vein every 21 ( twenty-one) days.     SUBOXONE 12-3 MG FILM Place 0.5 strips under the tongue 4 (four) times daily.     Trastuzumab (HERCEPTIN IV) Inject into the vein every 21 ( twenty-one) days.     Turmeric 500 MG CAPS Take 1,000 mg by mouth daily.     Turmeric, Curcuma Longa, (CURCUMIN) POWD Take 1 Scoop by mouth at bedtime.     lidocaine-prilocaine (EMLA) cream Apply a small amount to port a cath site and cover with plastic wrap 1 hour prior to infusion appointments (Patient not taking: Reported on 02/25/2022) 30 g 3   ondansetron (ZOFRAN) 8 MG tablet Take 1 tablet (8 mg total) by mouth every 8 (eight) hours as needed for nausea or vomiting. (Patient not taking: Reported on 02/25/2022) 20 tablet 0   prochlorperazine (COMPAZINE) 10 MG tablet Take 1 tablet (10 mg total) by mouth every 6 (six) hours as needed (Nausea or vomiting). (Patient not taking: Reported on 02/25/2022) 30 tablet 1   No current facility-administered medications for this visit.    ALLERGIES:  Allergies  Allergen Reactions   Hydrocodone Nausea And Vomiting   Sulfa  Antibiotics Nausea And Vomiting   Morphine And Related Rash    PHYSICAL EXAM:  Performance status (ECOG): 0 - Asymptomatic  There were no vitals filed for this visit. Wt Readings from Last 3 Encounters:  02/25/22 107 lb 4.8 oz (48.7 kg)  02/04/22 112 lb 3.2 oz (50.9 kg)  12/24/21 121 lb 6.4 oz (55.1 kg)   Physical Exam Vitals reviewed.  Constitutional:      Appearance: Normal appearance.  Cardiovascular:     Rate and Rhythm: Normal rate and regular rhythm.     Pulses: Normal pulses.     Heart sounds: Normal heart sounds.  Pulmonary:     Effort: Pulmonary effort is normal.     Breath sounds: Normal breath sounds.  Neurological:     General: No focal deficit present.     Mental Status: She is alert and oriented to person, place, and time.  Psychiatric:        Mood and Affect: Mood normal.        Behavior: Behavior normal.     LABORATORY DATA:  I have reviewed the labs as listed.     Latest Ref Rng & Units 02/04/2022    8:42 AM 12/24/2021    7:57 AM 12/19/2021    8:30 AM  CBC  WBC 4.0 - 10.5 K/uL 8.1  6.2  11.3   Hemoglobin 12.0 - 15.0 g/dL 11.8  10.7  12.1   Hematocrit 36.0 - 46.0 % 35.9  33.1  37.3   Platelets 150 - 400 K/uL 186  299  194       Latest Ref Rng & Units 02/04/2022    8:42 AM 12/24/2021    7:57 AM 12/19/2021    8:30 AM  CMP  Glucose 70 - 99 mg/dL 91  112  108   BUN 6 - 20 mg/dL _0 Creatinine 0.44 - 1.00 mg/dL 0.75  0.66  0.67   Sodium 135 -  145 mmol/L 139  139  137   Potassium 3.5 - 5.1 mmol/L 3.8  3.5  3.6   Chloride 98 - 111 mmol/L 106  107  103   CO2 22 - 32 mmol/L _0 Calcium 8.9 - 10.3 mg/dL 9.2  8.6  9.1   Total Protein 6.5 - 8.1 g/dL 7.1  6.6  7.8   Total Bilirubin 0.3 - 1.2 mg/dL 0.5  0.5  0.6   Alkaline Phos 38 - 126 U/L 44  67  92   AST 15 - 41 U/L _1 ALT 0 - 44 U/L _2 DIAGNOSTIC IMAGING:  I have independently reviewed the scans and discussed with the patient. DG HIP UNILAT WITH PELVIS 2-3 VIEWS  LEFT  Result Date: 02/04/2022 CLINICAL DATA:  History of metastatic breast cancer to the left hip. EXAM: DG HIP (WITH OR WITHOUT PELVIS) 2-3V LEFT COMPARISON:  PET-CT 11/28/2021. FINDINGS: [place] lung radiopacity is noted over the right pelvis questionable etiology. Pelvic calcifications consistent phleboliths. Stool noted throughout the colon. Degenerative changes lumbar spine and both hips. Lytic lesion right acetabulum best identified by prior PET-CT. Lytic lesion in the left femoral neck is again noted. No evidence of fracture or dislocation. IMPRESSION: Left femoral neck lytic lesion is again noted. No evidence of fracture or dislocation. Electronically Signed   By: Marcello Moores  Register M.D.   On: 02/04/2022 10:51     ASSESSMENT:  Her2+ metastatic breast cancer: - She reports feeling a knot in the right breast in July 2022.  She felt the same lump in September which has increased slightly in November.  In the last 3 months, it has increased in size from a quarter to size of an orange. - Mammogram/ultrasound on 10/01/2021: Large irregular mass involving the entire central right breast measuring 4.9 x 2.4 x 4 cm at 12:00.  At least 3 lymph nodes in the right axilla.  Overlying skin thickening and generalized erythema and marked tenderness. - Right breast mass and axillary lymph node biopsy on 10/08/2021: - Pathology: Poorly differentiated ductal carcinoma, grade 3, HER2 3+ positive, ER 5% strong staining intensity, PR negative, Ki-67 25%. - CT chest with contrast on 10/24/20[facility] at [facility]: Possible osseous metastatic disease involving left third and 12th ribs, manubrium, L1 vertebral body and left pedicle.  Enlarging 2.4 cm lesion in the central liver. - Physical examination shows mass and erythema involving most of the right breast, with warmth and orange peel appearance highly suggestive of inflammatory breast cancer. - PET scan (11/30/2021): Hypermetabolic tumor involving right breast, metastatic mediastinal and  hilar lymphadenopathy, hepatic metastatic disease, diffuse lytic bone lesions. - Cycle 1 THP started on 12/03/2021.    Social/family history: - She lives with her boyfriend.  She has 2 daughters ages 34 and 2.  She has worked in Programmer, applications previously.  She quit smoking 3 months ago.  Prior to that she smoked a pack a day for 4 to 5 years and less than half pack a day since 2011.  She vapes occasionally at this time. - Paternal great grandmother had lung cancer.  Paternal aunt had lung cancer at age 56.  Paternal grandmother had breast cancer in her 66s.  Paternal grandfather had colon cancer.  Maternal grandfather had colon cancer.   PLAN:  Her2+ metastatic right breast cancer to the liver and bones: - She has completed 3 cycles of  THP. - She lost about 5 pounds despite eating well.  She is drinking 1 to 2 cans of Ensure per day. - She had some nausea but denied any vomiting.  Brief mouth sores which improved.  Diarrhea which lasted 3 to 4 days and starts 3 days after each treatment.  Denies any tingling or numbness in the extremities. - She missed her CT scan and bone scan as power went out. - Reviewed labs today which showed normal LFTs and CBC.  Last tumor markers were normal. - Proceed with cycle 4 today.  We will reschedule CT scan and bone scan prior to next visit in 3 weeks.  2.  Right breast pain: - This has improved since start of therapy. - She could not get narcotics as she has failed drug test.  3.  Anxiety: - Continue Xanax daily as needed.  4.  Bone metastasis: - Left hip x-ray showed femoral neck lytic lesion with no evidence of fracture or destruction. - We discussed denosumab treatment after dental evaluation.   Orders placed this encounter:  No orders of the defined types were placed in this encounter.    Derek Jack, MD [place] [phone]   I, Thana Ates, am acting as a scribe for Dr. Derek Jack.  I,  Derek Jack MD, have reviewed the above documentation for accuracy and completeness, and I agree with the above.

## 2022-02-25 NOTE — Progress Notes (Signed)
Patient presents today for Taxotere/OGIVRI/Perjeta infusions today per providers order.  Vital signs and labs within parameters for treatment.  Message received from Anastasio Champion RN/Dr. Delton Coombes patient okay for treatment.  Treatment given today per MD orders.  Stable during infusion without adverse affects.  Vital signs stable.  No complaints at this time.  Discharge from clinic ambulatory in stable condition.  Alert and oriented X 3.  Follow up with [facility] as scheduled.

## 2022-02-27 ENCOUNTER — Inpatient Hospital Stay (HOSPITAL_COMMUNITY): Payer: Medicaid Other

## 2022-03-10 ENCOUNTER — Encounter (HOSPITAL_COMMUNITY): Payer: Medicaid Other

## 2022-03-10 ENCOUNTER — Other Ambulatory Visit: Payer: Self-pay

## 2022-03-11 ENCOUNTER — Ambulatory Visit (HOSPITAL_COMMUNITY): Admission: RE | Admit: 2022-03-11 | Payer: Medicaid Other | Source: Ambulatory Visit

## 2022-03-13 ENCOUNTER — Telehealth (HOSPITAL_COMMUNITY): Payer: Self-pay | Admitting: Hematology

## 2022-03-18 ENCOUNTER — Inpatient Hospital Stay: Payer: Medicaid Other | Admitting: Hematology

## 2022-03-18 ENCOUNTER — Inpatient Hospital Stay (HOSPITAL_COMMUNITY): Payer: Medicaid Other | Attending: Hematology

## 2022-03-18 ENCOUNTER — Encounter: Payer: Self-pay | Admitting: Hematology

## 2022-03-18 ENCOUNTER — Inpatient Hospital Stay: Payer: Medicaid Other

## 2022-03-18 ENCOUNTER — Other Ambulatory Visit: Payer: Self-pay

## 2022-03-20 ENCOUNTER — Ambulatory Visit (HOSPITAL_COMMUNITY): Payer: Medicaid Other

## 2022-09-16 NOTE — Progress Notes (Signed)
Voicemail received from patient that she would like to reschedule follow-up with Dr. Delton Coombes. I have attempted to call the patient on 4 separate occasions with no success in reaching the patient directly. Detailed VM left asking that the patient return my call.

## 2022-10-16 ENCOUNTER — Other Ambulatory Visit: Payer: Self-pay

## 2022-10-17 ENCOUNTER — Other Ambulatory Visit: Payer: Self-pay

## 2022-10-23 ENCOUNTER — Encounter (HOSPITAL_COMMUNITY): Payer: Medicaid Other

## 2022-10-23 ENCOUNTER — Inpatient Hospital Stay: Payer: Medicaid Other | Attending: Hematology

## 2022-10-28 ENCOUNTER — Ambulatory Visit (HOSPITAL_BASED_OUTPATIENT_CLINIC_OR_DEPARTMENT_OTHER): Payer: Medicaid Other

## 2022-11-03 ENCOUNTER — Ambulatory Visit: Payer: Medicaid Other | Admitting: Hematology

## 2022-11-10 ENCOUNTER — Other Ambulatory Visit: Payer: Self-pay

## 2022-11-10 ENCOUNTER — Ambulatory Visit (HOSPITAL_COMMUNITY)
Admission: RE | Admit: 2022-11-10 | Discharge: 2022-11-10 | Disposition: A | Discharge status: Home / Self Care | Payer: Medicaid Other | Source: Ambulatory Visit | Attending: Hematology | Admitting: Hematology

## 2022-11-10 ENCOUNTER — Encounter (HOSPITAL_COMMUNITY): Payer: Self-pay | Admitting: *Deleted

## 2022-11-10 ENCOUNTER — Inpatient Hospital Stay: Payer: Medicaid Other

## 2022-11-10 ENCOUNTER — Emergency Department (HOSPITAL_COMMUNITY): Payer: Medicaid Other

## 2022-11-10 ENCOUNTER — Inpatient Hospital Stay (HOSPITAL_COMMUNITY)
Admission: EM | Admit: 2022-11-10 | Discharge: 2022-11-17 | DRG: 025 | Disposition: A | Discharge status: Home / Self Care | Payer: Medicaid Other | Attending: Family Medicine | Admitting: Family Medicine

## 2022-11-10 DIAGNOSIS — G911 Obstructive hydrocephalus: Secondary | ICD-10-CM | POA: Diagnosis present

## 2022-11-10 DIAGNOSIS — R110 Nausea: Secondary | ICD-10-CM | POA: Diagnosis present

## 2022-11-10 DIAGNOSIS — G910 Communicating hydrocephalus: Secondary | ICD-10-CM | POA: Diagnosis present

## 2022-11-10 DIAGNOSIS — G9389 Other specified disorders of brain: Secondary | ICD-10-CM

## 2022-11-10 DIAGNOSIS — C787 Secondary malignant neoplasm of liver and intrahepatic bile duct: Secondary | ICD-10-CM | POA: Diagnosis present

## 2022-11-10 DIAGNOSIS — F05 Delirium due to known physiological condition: Secondary | ICD-10-CM | POA: Diagnosis not present

## 2022-11-10 DIAGNOSIS — Z1501 Genetic susceptibility to malignant neoplasm of breast: Secondary | ICD-10-CM

## 2022-11-10 DIAGNOSIS — Z8249 Family history of ischemic heart disease and other diseases of the circulatory system: Secondary | ICD-10-CM

## 2022-11-10 DIAGNOSIS — T402X5A Adverse effect of other opioids, initial encounter: Secondary | ICD-10-CM | POA: Diagnosis not present

## 2022-11-10 DIAGNOSIS — F419 Anxiety disorder, unspecified: Secondary | ICD-10-CM | POA: Diagnosis present

## 2022-11-10 DIAGNOSIS — Z681 Body mass index (BMI) 19 or less, adult: Secondary | ICD-10-CM

## 2022-11-10 DIAGNOSIS — C50911 Malignant neoplasm of unspecified site of right female breast: Secondary | ICD-10-CM | POA: Insufficient documentation

## 2022-11-10 DIAGNOSIS — G43909 Migraine, unspecified, not intractable, without status migrainosus: Secondary | ICD-10-CM | POA: Diagnosis present

## 2022-11-10 DIAGNOSIS — R451 Restlessness and agitation: Secondary | ICD-10-CM | POA: Diagnosis not present

## 2022-11-10 DIAGNOSIS — Z886 Allergy status to analgesic agent status: Secondary | ICD-10-CM

## 2022-11-10 DIAGNOSIS — L299 Pruritus, unspecified: Secondary | ICD-10-CM | POA: Diagnosis not present

## 2022-11-10 DIAGNOSIS — E43 Unspecified severe protein-calorie malnutrition: Secondary | ICD-10-CM | POA: Diagnosis present

## 2022-11-10 DIAGNOSIS — R7401 Elevation of levels of liver transaminase levels: Secondary | ICD-10-CM | POA: Diagnosis present

## 2022-11-10 DIAGNOSIS — M549 Dorsalgia, unspecified: Secondary | ICD-10-CM | POA: Diagnosis present

## 2022-11-10 DIAGNOSIS — C7931 Secondary malignant neoplasm of brain: Principal | ICD-10-CM | POA: Diagnosis present

## 2022-11-10 DIAGNOSIS — Z885 Allergy status to narcotic agent status: Secondary | ICD-10-CM

## 2022-11-10 DIAGNOSIS — Z825 Family history of asthma and other chronic lower respiratory diseases: Secondary | ICD-10-CM

## 2022-11-10 DIAGNOSIS — Z9221 Personal history of antineoplastic chemotherapy: Secondary | ICD-10-CM

## 2022-11-10 DIAGNOSIS — C7951 Secondary malignant neoplasm of bone: Secondary | ICD-10-CM | POA: Diagnosis present

## 2022-11-10 DIAGNOSIS — Z882 Allergy status to sulfonamides status: Secondary | ICD-10-CM

## 2022-11-10 DIAGNOSIS — Z853 Personal history of malignant neoplasm of breast: Secondary | ICD-10-CM

## 2022-11-10 DIAGNOSIS — Z95828 Presence of other vascular implants and grafts: Secondary | ICD-10-CM

## 2022-11-10 DIAGNOSIS — R519 Headache, unspecified: Principal | ICD-10-CM

## 2022-11-10 DIAGNOSIS — R410 Disorientation, unspecified: Secondary | ICD-10-CM | POA: Diagnosis not present

## 2022-11-10 DIAGNOSIS — Z87891 Personal history of nicotine dependence: Secondary | ICD-10-CM

## 2022-11-10 DIAGNOSIS — E860 Dehydration: Secondary | ICD-10-CM | POA: Diagnosis present

## 2022-11-10 DIAGNOSIS — G936 Cerebral edema: Secondary | ICD-10-CM | POA: Diagnosis present

## 2022-11-10 HISTORY — DX: Malignant (primary) neoplasm, unspecified: C80.1

## 2022-11-10 LAB — BASIC METABOLIC PANEL
Anion gap: 10 (ref 5–15)
BUN: 24 mg/dL — ABNORMAL HIGH (ref 6–20)
CO2: 26 mmol/L (ref 22–32)
Calcium: 9.7 mg/dL (ref 8.9–10.3)
Chloride: 94 mmol/L — ABNORMAL LOW (ref 98–111)
Creatinine, Ser: 0.61 mg/dL (ref 0.44–1.00)
GFR, Estimated: >60 mL/min (ref >60)
Glucose, Bld: 106 mg/dL — ABNORMAL HIGH (ref 70–99)
Potassium: 3.8 mmol/L (ref 3.5–5.1)
Sodium: 130 mmol/L — ABNORMAL LOW (ref 135–145)

## 2022-11-10 LAB — CBC
HCT: 42.7 % (ref 36.0–46.0)
Hemoglobin: 15 g/dL (ref 12.0–15.0)
MCH: 32 pg (ref 26.0–34.0)
MCHC: 35.1 g/dL (ref 30.0–36.0)
MCV: 91 fL (ref 80.0–100.0)
Platelets: 295 10*3/uL (ref 150–400)
RBC: 4.69 MIL/uL (ref 3.87–5.11)
RDW: 12.4 % (ref 11.5–15.5)
WBC: 11.1 10*3/uL — ABNORMAL HIGH (ref 4.0–10.5)
nRBC: 0 % (ref 0.0–0.2)

## 2022-11-10 LAB — HCG, SERUM, QUALITATIVE: Preg, Serum: NEGATIVE

## 2022-11-10 MED ORDER — IOHEXOL 300 MG/ML  SOLN
75.0000 mL | Freq: Once | INTRAMUSCULAR | Status: AC | PRN
  Administered 2022-11-10: 75 mL via INTRAVENOUS

## 2022-11-10 MED ORDER — ACETAMINOPHEN 500 MG PO TABS
1000.0000 mg | ORAL_TABLET | Freq: Once | ORAL | Status: AC
  Administered 2022-11-10: 1000 mg via ORAL
  Filled 2022-11-10: qty 2

## 2022-11-10 MED ORDER — DIPHENHYDRAMINE HCL 50 MG/ML IJ SOLN
25.0000 mg | Freq: Once | INTRAMUSCULAR | Status: AC
  Administered 2022-11-10: 25 mg via INTRAVENOUS
  Filled 2022-11-10: qty 1

## 2022-11-10 MED ORDER — METOCLOPRAMIDE HCL 5 MG/ML IJ SOLN
10.0000 mg | Freq: Once | INTRAMUSCULAR | Status: AC
  Administered 2022-11-10: 10 mg via INTRAVENOUS
  Filled 2022-11-10: qty 2

## 2022-11-10 MED ORDER — LACTATED RINGERS IV BOLUS
1000.0000 mL | Freq: Once | INTRAVENOUS | Status: AC
  Administered 2022-11-10: 1000 mL via INTRAVENOUS

## 2022-11-10 NOTE — ED Notes (Signed)
Patient transported to CT 

## 2022-11-10 NOTE — ED Notes (Signed)
EDP at bedside  

## 2022-11-10 NOTE — ED Triage Notes (Signed)
Pt with HA for 3 weeks. Pt has breast CA on right, denies receiving radiation or chemo.

## 2022-11-10 NOTE — ED Provider Notes (Signed)
[place] [facility] Provider Note   CSN: [id number] Arrival date & time: 11/10/22  1551     History  Chief Complaint  Patient presents with   Headache    Sara [name] is a 47 y.o. female.  47 year old female with history of metastatic breast cancer not actively being treated who presents to the emergency department with headache.  Patient reports that over the past month she has had a persistent and severe headache.  Occasionally will have bilateral blurry vision.  Says that she has tingling of her upper extremities but denies numbness or weakness of her extremities otherwise.  Does not take any medications at home.  Went in for a PET scan today and decided to come to the emergency department for evaluation afterwards.  Says that she has had neck stiffness that has been present for an entire month.  No fevers.  Last chemotherapy session was in July.  Has had occasional vomiting.      Home Medications Prior to Admission medications   Not on File      Allergies    Hydrocodone, Sulfa antibiotics, and Morphine and related    Review of Systems   Review of Systems  Physical Exam Updated Vital Signs BP 129/82   Pulse 73   Temp 99 F (37.2 C) (Oral)   Resp 18   Ht 5\' 4"  (1.626 m)   Wt 45.4 kg   LMP 11/08/2022   SpO2 99%   BMI 17.16 kg/m  Physical Exam Vitals and nursing note reviewed.  Constitutional:      General: She is not in acute distress.    Appearance: She is well-developed.  HENT:     Head: Normocephalic and atraumatic.     Right Ear: External ear normal.     Left Ear: External ear normal.     Nose: Nose normal.  Eyes:     Extraocular Movements: Extraocular movements intact.     Conjunctiva/sclera: Conjunctivae normal.     Pupils: Pupils are equal, round, and reactive to light.  Neck:     Comments: Negative Kernig sign.  Mild neck stiffness but is still able to fully range. Cardiovascular:     Rate and Rhythm:  Normal rate and regular rhythm.  Pulmonary:     Effort: Pulmonary effort is normal. No respiratory distress.  Musculoskeletal:     Right lower leg: No edema.     Left lower leg: No edema.  Skin:    General: Skin is warm and dry.  Neurological:     Mental Status: She is alert and oriented to person, place, and time. Mental status is at baseline.  Psychiatric:        Mood and Affect: Mood normal.     ED Results / Procedures / Treatments   Labs (all labs ordered are listed, but only abnormal results are displayed) Labs Reviewed  BASIC METABOLIC PANEL - Abnormal; Notable for the following components:      Result Value   Sodium 130 (*)    Chloride 94 (*)    Glucose, Bld 106 (*)    BUN 24 (*)    All other components within normal limits  CBC - Abnormal; Notable for the following components:   WBC 11.1 (*)    All other components within normal limits  HCG, SERUM, QUALITATIVE    EKG None  Radiology CT Head W or Wo Contrast  Result Date: 11/10/2022 CLINICAL DATA:  Headache with history of  metastatic breast cancer EXAM: CT HEAD WITHOUT AND WITH CONTRAST TECHNIQUE: Contiguous axial images were obtained from the base of the skull through the vertex without and with intravenous contrast. RADIATION DOSE REDUCTION: This exam was performed according to the departmental dose-optimization program which includes automated exposure control, adjustment of the mA and/or kV according to patient size and/or use of iterative reconstruction technique. CONTRAST:  10mL OMNIPAQUE IOHEXOL 300 MG/ML  SOLN COMPARISON:  None Available. FINDINGS: Brain: There is a 3.6 x 2.8 cm mass in the right cerebellum with surrounding vasogenic edema that causes mass effect on the cerebral aqueduct and fourth ventricle and causes moderate noncommunicating hydrocephalus of the lateral and third ventricles. There is periventricular hypoattenuation compatible with transependymal interstitial edema. There is a right parafalcine  mass measuring 1.2 x 1.0 cm. Vascular: No hyperdense vessel or unexpected calcification. Visible vessels are patent. Skull: Normal. Negative for fracture or focal lesion. Sinuses/Orbits: No acute finding. Other: None. IMPRESSION: 1. 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 Neurosurgery consultation recommended. 2. 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 MRI with and without contrast would provide better characterization of both lesions. Electronically Signed   By: Ulyses Jarred M.D.   On: 11/10/2022 22:46    Procedures Procedures   Medications Ordered in ED Medications  lactated ringers bolus 1,000 mL (0 mLs Intravenous Stopped 11/10/22 2247)  metoCLOPramide (REGLAN) injection 10 mg (10 mg Intravenous Given 11/10/22 2002)  diphenhydrAMINE (BENADRYL) injection 25 mg (25 mg Intravenous Given 11/10/22 2003)  acetaminophen (TYLENOL) tablet 1,000 mg (1,000 mg Oral Given 11/10/22 2007)  iohexol (OMNIPAQUE) 300 MG/ML solution 75 mL (75 mLs Intravenous Contrast Given 11/10/22 2137)    ED Course/ Medical Decision Making/ A&P Clinical Course as of 11/11/22 0035  Tue Nov 11, 2022  0005 Sara Aye NP from [facility] recommends admission to [facility].  [RP]  0028 Dr Josephine Cables [RP]    Clinical Course User Index [RP] Fransico Meadow, MD                            Medical Decision Making Amount and/or Complexity of Data Reviewed Labs: ordered. Radiology: ordered.  Risk OTC drugs. Prescription drug management. Decision regarding hospitalization.   Sara [name] is a 47 y.o. female with comorbidities that complicate the patient evaluation including metastatic breast cancer who presents emergency department with headache  Initial Ddx:  Brain metastases, migraine, meningitis/encephalitis  MDM:  Was concerned  about brain metastases given the patient's symptoms.  We do not have MRI available at this time so we will obtain CT of the head with and without contrast.  Will also treat her for migraine in the meantime.  With her neck stiffness considered meningitis/encephalitis such as cryptococcal meningitis but patient's last round of chemotherapy was in the summer so should not be severely immunocompromise at this time would be very unlikely for most forms of meningitis and encephalitis to persist for an entire month.  Plan:  Labs Pain medication CT head with and without contrast  ED Summary/Re-evaluation:  Patient underwent the above workup and was found to have a cerebellar mass with obstructive hydrocephalus.  Also found to have another mass along the falx that was concerning for possible meningioma.  Concerned that she may have metastases to the brain.  MRI with and without contrast was ordered.  Neurosurgery was consulted who recommended admission to [facility] under medical team.  They will see [place] in consult.  This patient presents to the ED for concern of complaints listed in HPI, this involves an extensive number of treatment options, and is a complaint that carries with it a high risk of complications and morbidity. Disposition including potential need for admission considered.   Dispo: Admit to Floor  Additional history obtained from family Records reviewed Outpatient Clinic Notes The following labs were independently interpreted: Chemistry and show  hyponatremia I independently reviewed the following imaging with scope of interpretation limited to determining acute life threatening conditions related to emergency care: CT Head and agree with the radiologist interpretation with the following exceptions: None I personally reviewed and interpreted cardiac monitoring: normal sinus rhythm  I personally reviewed and interpreted the pt's EKG: see above for interpretation  I have reviewed the patients  home medications and made adjustments as needed Consults: Hospitalist and Neurosurgery  Final Clinical Impression(s) / ED Diagnoses Final diagnoses:  Bad headache  Cerebellar mass  Obstructive hydrocephalus ([place])  History of breast cancer   Rx / DC Orders ED Discharge Orders     None         Fransico Meadow, MD 11/11/22 [phone]

## 2022-11-11 ENCOUNTER — Ambulatory Visit (HOSPITAL_COMMUNITY): Payer: Medicaid Other

## 2022-11-11 ENCOUNTER — Inpatient Hospital Stay (HOSPITAL_COMMUNITY): Payer: Medicaid Other

## 2022-11-11 ENCOUNTER — Ambulatory Visit (HOSPITAL_COMMUNITY): Admission: RE | Admit: 2022-11-11 | Payer: Medicaid Other | Source: Ambulatory Visit

## 2022-11-11 DIAGNOSIS — Z886 Allergy status to analgesic agent status: Secondary | ICD-10-CM | POA: Diagnosis not present

## 2022-11-11 DIAGNOSIS — G936 Cerebral edema: Secondary | ICD-10-CM | POA: Diagnosis present

## 2022-11-11 DIAGNOSIS — L299 Pruritus, unspecified: Secondary | ICD-10-CM | POA: Diagnosis not present

## 2022-11-11 DIAGNOSIS — C7931 Secondary malignant neoplasm of brain: Secondary | ICD-10-CM

## 2022-11-11 DIAGNOSIS — T402X5A Adverse effect of other opioids, initial encounter: Secondary | ICD-10-CM | POA: Diagnosis not present

## 2022-11-11 DIAGNOSIS — G43909 Migraine, unspecified, not intractable, without status migrainosus: Secondary | ICD-10-CM | POA: Diagnosis present

## 2022-11-11 DIAGNOSIS — Z853 Personal history of malignant neoplasm of breast: Secondary | ICD-10-CM | POA: Diagnosis not present

## 2022-11-11 DIAGNOSIS — Z8249 Family history of ischemic heart disease and other diseases of the circulatory system: Secondary | ICD-10-CM | POA: Diagnosis not present

## 2022-11-11 DIAGNOSIS — Z1501 Genetic susceptibility to malignant neoplasm of breast: Secondary | ICD-10-CM | POA: Diagnosis not present

## 2022-11-11 DIAGNOSIS — F05 Delirium due to known physiological condition: Secondary | ICD-10-CM | POA: Diagnosis not present

## 2022-11-11 DIAGNOSIS — R7401 Elevation of levels of liver transaminase levels: Secondary | ICD-10-CM | POA: Diagnosis present

## 2022-11-11 DIAGNOSIS — M549 Dorsalgia, unspecified: Secondary | ICD-10-CM | POA: Diagnosis present

## 2022-11-11 DIAGNOSIS — E860 Dehydration: Secondary | ICD-10-CM | POA: Diagnosis present

## 2022-11-11 DIAGNOSIS — G919 Hydrocephalus, unspecified: Secondary | ICD-10-CM | POA: Diagnosis not present

## 2022-11-11 DIAGNOSIS — Z87891 Personal history of nicotine dependence: Secondary | ICD-10-CM | POA: Diagnosis not present

## 2022-11-11 DIAGNOSIS — Z825 Family history of asthma and other chronic lower respiratory diseases: Secondary | ICD-10-CM | POA: Diagnosis not present

## 2022-11-11 DIAGNOSIS — R519 Headache, unspecified: Secondary | ICD-10-CM

## 2022-11-11 DIAGNOSIS — G9389 Other specified disorders of brain: Secondary | ICD-10-CM | POA: Diagnosis not present

## 2022-11-11 DIAGNOSIS — Z885 Allergy status to narcotic agent status: Secondary | ICD-10-CM | POA: Diagnosis not present

## 2022-11-11 DIAGNOSIS — Z9221 Personal history of antineoplastic chemotherapy: Secondary | ICD-10-CM | POA: Diagnosis not present

## 2022-11-11 DIAGNOSIS — G911 Obstructive hydrocephalus: Secondary | ICD-10-CM | POA: Diagnosis present

## 2022-11-11 DIAGNOSIS — C50911 Malignant neoplasm of unspecified site of right female breast: Secondary | ICD-10-CM | POA: Diagnosis not present

## 2022-11-11 DIAGNOSIS — Z681 Body mass index (BMI) 19 or less, adult: Secondary | ICD-10-CM | POA: Diagnosis not present

## 2022-11-11 DIAGNOSIS — F419 Anxiety disorder, unspecified: Secondary | ICD-10-CM | POA: Diagnosis present

## 2022-11-11 DIAGNOSIS — E43 Unspecified severe protein-calorie malnutrition: Secondary | ICD-10-CM | POA: Diagnosis present

## 2022-11-11 DIAGNOSIS — C787 Secondary malignant neoplasm of liver and intrahepatic bile duct: Secondary | ICD-10-CM | POA: Diagnosis present

## 2022-11-11 DIAGNOSIS — C7951 Secondary malignant neoplasm of bone: Secondary | ICD-10-CM | POA: Diagnosis present

## 2022-11-11 DIAGNOSIS — R410 Disorientation, unspecified: Secondary | ICD-10-CM | POA: Diagnosis not present

## 2022-11-11 DIAGNOSIS — G910 Communicating hydrocephalus: Secondary | ICD-10-CM | POA: Diagnosis present

## 2022-11-11 DIAGNOSIS — Z882 Allergy status to sulfonamides status: Secondary | ICD-10-CM | POA: Diagnosis not present

## 2022-11-11 DIAGNOSIS — Z95828 Presence of other vascular implants and grafts: Secondary | ICD-10-CM | POA: Diagnosis not present

## 2022-11-11 LAB — HIV ANTIBODY (ROUTINE TESTING W REFLEX): HIV Screen 4th Generation wRfx: NONREACTIVE

## 2022-11-11 LAB — CBC
HCT: 42.4 % (ref 36.0–46.0)
Hemoglobin: 14.5 g/dL (ref 12.0–15.0)
MCH: 31.5 pg (ref 26.0–34.0)
MCHC: 34.2 g/dL (ref 30.0–36.0)
MCV: 92 fL (ref 80.0–100.0)
Platelets: 291 10*3/uL (ref 150–400)
RBC: 4.61 MIL/uL (ref 3.87–5.11)
RDW: 12.3 % (ref 11.5–15.5)
WBC: 13.2 10*3/uL — ABNORMAL HIGH (ref 4.0–10.5)
nRBC: 0 % (ref 0.0–0.2)

## 2022-11-11 LAB — BASIC METABOLIC PANEL
Anion gap: 12 (ref 5–15)
BUN: 15 mg/dL (ref 6–20)
CO2: 25 mmol/L (ref 22–32)
Calcium: 9.6 mg/dL (ref 8.9–10.3)
Chloride: 96 mmol/L — ABNORMAL LOW (ref 98–111)
Creatinine, Ser: 0.8 mg/dL (ref 0.44–1.00)
GFR, Estimated: >60 mL/min (ref >60)
Glucose, Bld: 123 mg/dL — ABNORMAL HIGH (ref 70–99)
Potassium: 3.9 mmol/L (ref 3.5–5.1)
Sodium: 133 mmol/L — ABNORMAL LOW (ref 135–145)

## 2022-11-11 LAB — MAGNESIUM: Magnesium: 2.1 mg/dL (ref 1.7–2.4)

## 2022-11-11 MED ORDER — METOCLOPRAMIDE HCL 5 MG/ML IJ SOLN
10.0000 mg | Freq: Once | INTRAMUSCULAR | Status: AC
  Administered 2022-11-11: 10 mg via INTRAVENOUS
  Filled 2022-11-11: qty 2

## 2022-11-11 MED ORDER — HEPARIN SODIUM (PORCINE) 5000 UNIT/ML IJ SOLN
5000.0000 [IU] | Freq: Three times a day (TID) | INTRAMUSCULAR | Status: DC
  Administered 2022-11-11: 5000 [IU] via SUBCUTANEOUS
  Filled 2022-11-11: qty 1

## 2022-11-11 MED ORDER — ONDANSETRON HCL 4 MG PO TABS: 4.0000 mg | ORAL_TABLET | Freq: Four times a day (QID) | ORAL | Status: DC | PRN

## 2022-11-11 MED ORDER — KETOROLAC TROMETHAMINE 15 MG/ML IJ SOLN
15.0000 mg | Freq: Once | INTRAMUSCULAR | Status: AC
  Administered 2022-11-11: 15 mg via INTRAVENOUS
  Filled 2022-11-11: qty 1

## 2022-11-11 MED ORDER — SODIUM CHLORIDE 0.9 % IV SOLN: INTRAVENOUS | Status: DC

## 2022-11-11 MED ORDER — INDOMETHACIN ER 75 MG PO CPCR: 75.0000 mg | ORAL_CAPSULE | Freq: Once | ORAL | Status: DC

## 2022-11-11 MED ORDER — HYDROMORPHONE HCL 1 MG/ML IJ SOLN
1.0000 mg | INTRAMUSCULAR | Status: DC | PRN
  Administered 2022-11-11 – 2022-11-12 (×4): 2 mg via INTRAVENOUS
  Administered 2022-11-12: 1 mg via INTRAVENOUS
  Administered 2022-11-12 (×2): 2 mg via INTRAVENOUS
  Administered 2022-11-12: 1 mg via INTRAVENOUS
  Administered 2022-11-12 (×3): 2 mg via INTRAVENOUS
  Administered 2022-11-13 (×2): 1 mg via INTRAVENOUS
  Administered 2022-11-13: 2 mg via INTRAVENOUS
  Administered 2022-11-13 (×2): 1 mg via INTRAVENOUS
  Administered 2022-11-13: 2 mg via INTRAVENOUS
  Administered 2022-11-13: 1 mg via INTRAVENOUS
  Administered 2022-11-14: 2 mg via INTRAVENOUS
  Administered 2022-11-14 (×3): 1 mg via INTRAVENOUS
  Administered 2022-11-14: 2 mg via INTRAVENOUS
  Filled 2022-11-11 (×2): qty 1
  Filled 2022-11-11 (×2): qty 2
  Filled 2022-11-11: qty 1
  Filled 2022-11-11: qty 2
  Filled 2022-11-11: qty 1
  Filled 2022-11-11 (×5): qty 2
  Filled 2022-11-11 (×2): qty 1
  Filled 2022-11-11: qty 2
  Filled 2022-11-11 (×2): qty 1
  Filled 2022-11-11: qty 2
  Filled 2022-11-11: qty 1
  Filled 2022-11-11 (×3): qty 2
  Filled 2022-11-11: qty 1

## 2022-11-11 MED ORDER — BUTALBITAL-APAP-CAFFEINE 50-325-40 MG PO TABS
2.0000 | ORAL_TABLET | Freq: Once | ORAL | Status: AC
  Administered 2022-11-11: 2 via ORAL
  Filled 2022-11-11: qty 2

## 2022-11-11 MED ORDER — GADOBUTROL 1 MMOL/ML IV SOLN
5.0000 mL | Freq: Once | INTRAVENOUS | Status: AC | PRN
  Administered 2022-11-11: 5 mL via INTRAVENOUS

## 2022-11-11 MED ORDER — ACETAMINOPHEN 325 MG PO TABS
650.0000 mg | ORAL_TABLET | Freq: Four times a day (QID) | ORAL | Status: DC | PRN
  Administered 2022-11-13 – 2022-11-16 (×8): 650 mg via ORAL
  Filled 2022-11-11 (×8): qty 2

## 2022-11-11 MED ORDER — SODIUM CHLORIDE 0.9 % IV SOLN
4.0000 mg | Freq: Once | INTRAVENOUS | Status: AC
  Administered 2022-11-11: 4 mg via INTRAVENOUS
  Filled 2022-11-11: qty 1

## 2022-11-11 MED ORDER — DEXAMETHASONE SODIUM PHOSPHATE 10 MG/ML IJ SOLN
10.0000 mg | Freq: Four times a day (QID) | INTRAMUSCULAR | Status: DC
  Administered 2022-11-11 – 2022-11-12 (×4): 10 mg via INTRAVENOUS
  Filled 2022-11-11 (×4): qty 1

## 2022-11-11 MED ORDER — DIPHENHYDRAMINE HCL 50 MG/ML IJ SOLN
25.0000 mg | Freq: Once | INTRAMUSCULAR | Status: AC
  Administered 2022-11-11: 25 mg via INTRAVENOUS
  Filled 2022-11-11: qty 1

## 2022-11-11 MED ORDER — ACETAMINOPHEN 650 MG RE SUPP: 650.0000 mg | Freq: Four times a day (QID) | RECTAL | Status: DC | PRN

## 2022-11-11 MED ORDER — HYDROMORPHONE HCL 1 MG/ML IJ SOLN
1.0000 mg | INTRAMUSCULAR | Status: DC | PRN
  Administered 2022-11-11 (×4): 1 mg via INTRAVENOUS
  Filled 2022-11-11 (×4): qty 1

## 2022-11-11 MED ORDER — HYDROMORPHONE HCL 1 MG/ML IJ SOLN
1.0000 mg | Freq: Once | INTRAMUSCULAR | Status: AC
  Administered 2022-11-11: 1 mg via INTRAVENOUS
  Filled 2022-11-11: qty 1

## 2022-11-11 MED ORDER — ONDANSETRON HCL 4 MG/2ML IJ SOLN
4.0000 mg | Freq: Four times a day (QID) | INTRAMUSCULAR | Status: DC | PRN
  Administered 2022-11-11 – 2022-11-12 (×2): 4 mg via INTRAVENOUS
  Filled 2022-11-11 (×2): qty 2

## 2022-11-11 NOTE — ED Notes (Signed)
Verified dose and rate of decadron with pharmacy due to warning on alaris. Pharmacy stated warning was an error. 4mg  is a safe dose.

## 2022-11-11 NOTE — Progress Notes (Signed)
Patient transferred to 4N room 04.

## 2022-11-11 NOTE — Hospital Course (Addendum)
Sara [name] is a 47 y.o. female with history of HER2 positive metastatic right breast cancer. Patient presented secondary to worsening severe headache.  CT imaging significant for moderate hydrocephalus with obstruction at the level of cerebral aqueduct secondary to mass effect from right cerebellar/posterior fossa mass in addition to evidence of a second mass along the right aspect of the falx cerebri concerning for meningioma.  MRI confirms a 3.8 cm right cerebellar metastatic mass with vasogenic edema and mass effect causing obstructing hydrocephalus in addition to a 1.3 cm metastatic lesion of the right parietal lobe.  Patient started empirically on Decadron IV.  Neurosurgery consulted and recommended transfer to [facility].  Neurosurgery performed a successful craniectomy for resection of the cerebellar mass on 3/27, confirmed metastatic adenocarcinoma; improvement of symptoms.

## 2022-11-11 NOTE — ED Notes (Signed)
Adefeso, MD paged to inquire about obtaining prn pain medication for patient's headache. Awaiting response.

## 2022-11-11 NOTE — Progress Notes (Signed)
  Transition of Care [facility]) Screening Note   Patient Details  Name: Sara [name] Date of Birth: [birth year]   Transition of Care [facility]) CM/SW Contact:    Iona Beard, [place] Phone Number: 11/11/2022, 10:16 AM    Transition of Care Department [facility]) has reviewed patient and no TOC needs have been identified at this time. We will continue to monitor patient advancement through interdisciplinary progression rounds. If new patient transition needs arise, please place a TOC consult.

## 2022-11-11 NOTE — ED Notes (Signed)
Writer rounded on patient. Pt had urinated on self in bed. Writer cleaned up patient and changed linen. Pt complaining of pain being severe again. Writer messaged provider requesting more pain medication.

## 2022-11-11 NOTE — ED Notes (Signed)
Patient reports a 10/10 headache. Meds requested from provider.

## 2022-11-11 NOTE — Progress Notes (Signed)
[facility] PROGRESS NOTE  Patient: Sara [name] [id number]   PCP: Neale Burly, MD DOB: January 16, 1976   DOA: 11/10/2022   DOS: 11/11/2022    Patient seen after arrival to the hospital. Continues to report ongoing severe headache. Discussed with neurosurgery. Appreciate consultation. Started on Decadron. Due to ongoing headache we will increase the dose of the Dilaudid. May require a PCA. Anticipate improvement in headache with few doses of Decadron. Most likely patient will wait for home having goals of care conversation. Neurosurgery will consider intervention in the next 24 hours. Discontinue heparin. SCDs only. Patient will be transferred to 4 N. progressive neurosurgical floor.  Author: Berle Mull, MD [facility] 11/11/2022 7:09 PM   If 7PM-7AM, please contact night-coverage at [email or web address]

## 2022-11-11 NOTE — Progress Notes (Signed)
Patient arrived  to the unit via Ambulance from [facility].  A/O x 4. VSS. Oriented patient to the room and staff. Education provided regarding safety precaution and patient  verbalize understanding.

## 2022-11-11 NOTE — Progress Notes (Signed)
PROGRESS NOTE    Patient: Sara [name]                            PCP: Neale Burly, MD                    DOB: 1975-08-30            DOA: 11/10/2022 [id number]             DOS: 11/11/2022, 10:20 AM   LOS: 0 days   Date of Service: The patient was seen and examined on 11/11/2022  Subjective:   The patient was seen and examined this morning. Hemodynamically stable. Continue to complain of severe headache with no visual changes, no focal neurological findings   Brief Narrative:   Sara [name] is a 47 y.o. female with medical history significant of HER2+ metastatic right breast cancer who presents to the emergency department due to 1 month history of persistent and severe headache which recently worsened.  This was associated with occasional blurry vision.  She complained of neck stiffness and tingling of upper extremities without weakness/numbness.  She endorsed occasional vomiting.  Patient states that she went for a PET scan yesterday and decided to go to the emergency department for evaluation of the headache.  She denies fever, chest pain, shortness of breath.  Last chemotherapy was July 2023.  Patient states that she has lost about 35 pounds in 6 months unintentionally.   ED Course:  In the emergency department, she was hemodynamically stable with normal vital signs.  Workup in the ED showed normal CBC except for WBC of 11.1, BMP showed sodium 138, potassium 3.8, chloride 94, bicarb 26, blood glucose 106, BUN 24, creatinine 0.61, pregnancy test was negative. CT head without and with contrast showed 1. 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 Neurosurgery consultation recommended. 2. 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 MRI with and without contrast would provide better  characterization of both lesions. She was treated with Reglan, Benadryl and Tylenol.  Neurosurgery PA at [facility] was consulted and recommended admitting patient to [facility] with plan to consult on patient on arrival to [facility].  Hospitalist was asked to admit patient for further evaluation and management.   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 Raliegh Ip   Dehydration Continue gentle hydration  ------------------------------------------------------------------------------------------------------------------------  DVT prophylaxis:  heparin injection 5,000 Units Start: 11/11/22 1400 SCDs Start: 11/11/22 [id number]   Code Status:   Code Status: Full Code  Family Communication: No family member present at bedside- attempt will be made to update daily The above findings and plan of care has been discussed with patient (and family)  in detail,  they expressed understanding and agreement of above. -Advance care planning has been  discussed.   Admission status:   Status is: Inpatient Remains inpatient appropriate because: Neurosurgery evaluation   Disposition: From  - home             Planning for discharge in 1-2 days: to   Procedures:   No admission procedures for hospital encounter.   Antimicrobials:  Anti-infectives (From admission, onward)    None        Medication:   heparin  5,000 Units Subcutaneous Q8H    acetaminophen **OR** acetaminophen, HYDROmorphone (DILAUDID) injection, ondansetron **OR** ondansetron (ZOFRAN) IV   Objective:   Vitals:   11/11/22 0630 11/11/22 0700 11/11/22 0806 11/11/22 1018  BP: (!) 131/95 100/72  122/87  Pulse: 70 89  86  Resp: 18   20  Temp:   98.2 F (36.8 C)   TempSrc:   Oral   SpO2: 96% 98%  100%  Weight:      Height:       No intake or output data in the 24 hours ending 11/11/22 1020 Filed Weights   11/10/22 1601  Weight: 45.4 kg     Physical examination:   Constitution:  Alert, cooperative, no distress,  Appears calm and comfortable  Psychiatric:   Normal and stable mood and affect, cognition intact,   HEENT:        Normocephalic, PERRL, otherwise with in Normal limits  Chest:         Chest symmetric Cardio vascular:  S1/S2, RRR, No murmure, No Rubs or Gallops  pulmonary: Clear to auscultation bilaterally, respirations unlabored, negative wheezes / crackles Abdomen: Soft, non-tender, non-distended, bowel sounds,no masses, no organomegaly Muscular skeletal: Limited exam - in bed, able to move all 4 extremities,   Neuro: CNII-XII intact. , normal motor and sensation, reflexes intact  Extremities: No pitting edema lower extremities, +2 pulses  Skin: Dry, warm to touch, negative for any Rashes, No open wounds Wounds: per nursing documentation   ------------------------------------------------------------------------------------------------------------------------------------------    LABs:     Latest Ref Rng & Units 11/10/2022    8:05  PM 02/25/2022    8:47 AM 02/04/2022    8:42 AM  CBC  WBC 4.0 - 10.5 K/uL 11.1  13.4  8.1   Hemoglobin 12.0 - 15.0 g/dL 15.0  12.4  11.8   Hematocrit 36.0 - 46.0 % 42.7  37.4  35.9   Platelets 150 - 400 K/uL 295  284  186       Latest Ref Rng & Units 11/10/2022    8:05 PM 02/25/2022    8:47 AM 02/04/2022    8:42 AM  CMP  Glucose 70 - 99 mg/dL 106  89  91   BUN 6 - 20 mg/dL 24  16  19    Creatinine 0.44 - 1.00 mg/dL 0.61  0.73  0.75   Sodium 135 - 145 mmol/L 130  139  139   Potassium 3.5 - 5.1 mmol/L 3.8  3.5  3.8   Chloride 98 - 111 mmol/L 94  108  106   CO2 22 - 32 mmol/L 26  27  27  Calcium 8.9 - 10.3 mg/dL 9.7  9.3  9.2   Total Protein 6.5 - 8.1 g/dL  7.4  7.1   Total Bilirubin 0.3 - 1.2 mg/dL  0.5  0.5   Alkaline Phos 38 - 126 U/L  47  44   AST 15 - 41 U/L  22  20   ALT 0 - 44 U/L  16  13        Micro Results No results found for this or any previous visit (from the past 240 hour(s)).  Radiology Reports MR Brain W and Wo Contrast  Result Date: 11/11/2022 CLINICAL DATA:  Metastatic disease evaluation EXAM: MRI HEAD WITHOUT AND WITH CONTRAST TECHNIQUE: Multiplanar, multiecho pulse sequences of the brain and surrounding structures were obtained without and with intravenous contrast. CONTRAST:  29mL GADAVIST GADOBUTROL 1 MMOL/ML IV SOLN COMPARISON:  Head CT from yesterday FINDINGS: Brain: 3.8 cm parenchymal mass in the right cerebellum with vasogenic edema. Prominent local mass effect with downward descent of the cerebellar tonsils and fourth ventricular obstruction causing of string hydrocephalus with transependymal CSF flow. Additional 1.3 Cm mass in the parasagittal right parietal lobe attributed to second metastasis. No acute infarct or hemorrhage. Vascular: Major flow voids and vascular enhancement are preserved Skull and upper cervical spine: Normal marrow signal. Sinuses/Orbits: Negative Findings are known to clinical service per notes and neurosurgery is aware. Direction  communication with hospitalist is also underway. IMPRESSION: 1. 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 There is downward descent of the tonsils and fourth ventricular obstruction causing hydrocephalus. 2. Second 13 mm metastasis in the right parietal lobe. Electronically Signed   By: Jorje Guild M.D.   On: 11/11/2022 08:07   CT Head W or Wo Contrast  Result Date: 11/10/2022 CLINICAL DATA:  Headache with history of metastatic breast cancer EXAM: CT HEAD WITHOUT AND WITH CONTRAST TECHNIQUE: Contiguous axial images were obtained from the base of the skull through the vertex without and with intravenous contrast. RADIATION DOSE REDUCTION: This exam was performed according to the departmental dose-optimization program which includes automated exposure control, adjustment of the mA and/or kV according to patient size and/or use of iterative reconstruction technique. CONTRAST:  34mL OMNIPAQUE IOHEXOL 300 MG/ML  SOLN COMPARISON:  None Available. FINDINGS: Brain: There is a 3.6 x 2.8 cm mass in the right cerebellum with surrounding vasogenic edema that causes mass effect on the cerebral aqueduct and fourth ventricle and causes moderate noncommunicating hydrocephalus of the lateral and third ventricles. There is periventricular hypoattenuation compatible with transependymal interstitial edema. There is a right parafalcine mass measuring 1.2 x 1.0 cm. Vascular: No hyperdense vessel or unexpected calcification. Visible vessels are patent. Skull: Normal. Negative for fracture or focal lesion. Sinuses/Orbits: No acute finding. Other: None. IMPRESSION: 1. 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 Neurosurgery consultation recommended. 2. There is a second mass along the right aspect of the falx cerebri, possibly a  meningioma. MRI with and without contrast would provide better characterization of both lesions. Electronically Signed   By: Ulyses Jarred M.D.   On: 11/10/2022 22:46    SIGNED: Deatra James, MD, FHM. FAAFP. [facility] Time spent > 35 min.  In seeing, evaluating and examining the patient. Reviewing medical records, labs, drawn plan of care. [facility],  Pager (please use [email or web address] to  page/ text) Please use Epic Secure Chat for non-urgent communication (7AM-7PM)  If 7PM-7AM, please contact night-coverage [email or web address], 11/11/2022, 10:20 AM

## 2022-11-11 NOTE — Plan of Care (Signed)

## 2022-11-11 NOTE — ED Notes (Signed)
Josephine Cables, MD has view message from writer requesting more pain medication for patient and evaluating possibilities at this time.

## 2022-11-11 NOTE — Consult Note (Signed)
Reason for Consult: Right cerebellar mass Referring Physician: Hospitalist  Sara [name] is an 47 y.o. female.  HPI: 47 year old female with a history of breast cancer she says was diagnosed many many years ago back in the 90s and says she completed treatment last July presented with headaches that been going on for a month got progressively worse associated with intermittent nausea and vomiting.  Patient was worked up at [place] scan showed a cerebellar mass patient underwent MRI scan has been transferred here.  Patient does state the headache is better today than it was yesterday.  Past Medical History:  Diagnosis Date   Anxiety    Back pain    Cancer [facility])     Past Surgical History:  Procedure Laterality Date   CHOLECYSTECTOMY     ORTHOPEDIC SURGERY     PORTACATH PLACEMENT Left 11/18/2021   Procedure: INSERTION PORT-A-CATH;  Surgeon: Virl Cagey, MD;  Location: [facility];  Service: General;  Laterality: Left;    Family History  Problem Relation Age of Onset   Cancer Paternal Grandfather    Cancer Paternal Grandmother        lung   Heart disease Brother    Asthma Daughter    Asthma Daughter     Social History:  reports that she quit smoking about a year ago. Her smoking use included cigarettes. She has a 5.00 pack-year smoking history. She has never used smokeless tobacco. She reports that she does not drink alcohol and does not use drugs.  Allergies:  Allergies  Allergen Reactions   Hydrocodone Nausea And Vomiting   Sulfa Antibiotics Nausea And Vomiting   Morphine And Related Rash    Medications: I have reviewed the patient's current medications.  Results for orders placed or performed during the hospital encounter of 11/10/22 (from the past 48 hour(s))  Basic metabolic panel     Status: Abnormal   Collection Time: 11/10/22  8:05 PM  Result Value Ref Range   Sodium 130 (L) 135 - 145 mmol/L   Potassium 3.8 3.5 - 5.1 mmol/L   Chloride 94 (L) 98 - 111  mmol/L   CO2 26 22 - 32 mmol/L   Glucose, Bld 106 (H) 70 - 99 mg/dL    Comment: Glucose reference range applies only to samples taken after fasting for at least 8 hours.   BUN 24 (H) 6 - 20 mg/dL   Creatinine, Ser 0.61 0.44 - 1.00 mg/dL   Calcium 9.7 8.9 - 10.3 mg/dL   GFR, Estimated >60 >60 mL/min    Comment: (NOTE) Calculated using the CKD-EPI Creatinine Equation (2021)    Anion gap 10 5 - 15    Comment: Performed at [facility], [address], [place], [place] [address]  CBC     Status: Abnormal   Collection Time: 11/10/22  8:05 PM  Result Value Ref Range   WBC 11.1 (H) 4.0 - 10.5 K/uL   RBC 4.69 3.87 - 5.11 MIL/uL   Hemoglobin 15.0 12.0 - 15.0 g/dL   HCT 42.7 36.0 - 46.0 %   MCV 91.0 80.0 - 100.0 fL   MCH 32.0 26.0 - 34.0 pg   MCHC 35.1 30.0 - 36.0 g/dL   RDW 12.4 11.5 - 15.5 %   Platelets 295 150 - 400 K/uL   nRBC 0.0 0.0 - 0.2 %    Comment: Performed at [facility], [address] [place], [place] [address]  hCG, serum, qualitative     Status: None  Collection Time: 11/10/22  8:29 PM  Result Value Ref Range   Preg, Serum NEGATIVE NEGATIVE    Comment:        THE SENSITIVITY OF THIS METHODOLOGY IS >10 mIU/mL. Performed at [facility], [address], [place], [place] [address]     MR Brain W and Wo Contrast  Result Date: 11/11/2022 CLINICAL DATA:  Metastatic disease evaluation EXAM: MRI HEAD WITHOUT AND WITH CONTRAST TECHNIQUE: Multiplanar, multiecho pulse sequences of the brain and surrounding structures were obtained without and with intravenous contrast. CONTRAST:  49mL GADAVIST GADOBUTROL 1 MMOL/ML IV SOLN COMPARISON:  Head CT from yesterday FINDINGS: Brain: 3.8 cm parenchymal mass in the right cerebellum with vasogenic edema. Prominent local mass effect with downward descent of the cerebellar tonsils and fourth ventricular obstruction causing of string hydrocephalus with transependymal CSF flow. Additional 1.3 Cm mass in the parasagittal right parietal lobe  attributed to second metastasis. No acute infarct or hemorrhage. Vascular: Major flow voids and vascular enhancement are preserved Skull and upper cervical spine: Normal marrow signal. Sinuses/Orbits: Negative Findings are known to clinical service per notes and neurosurgery is aware. Direction communication with hospitalist is also underway. IMPRESSION: 1. 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 There is downward descent of the tonsils and fourth ventricular obstruction causing hydrocephalus. 2. Second 13 mm metastasis in the right parietal lobe. Electronically Signed   By: Jorje Guild M.D.   On: 11/11/2022 08:07   CT Head W or Wo Contrast  Result Date: 11/10/2022 CLINICAL DATA:  Headache with history of metastatic breast cancer EXAM: CT HEAD WITHOUT AND WITH CONTRAST TECHNIQUE: Contiguous axial images were obtained from the base of the skull through the vertex without and with intravenous contrast. RADIATION DOSE REDUCTION: This exam was performed according to the departmental dose-optimization program which includes automated exposure control, adjustment of the mA and/or kV according to patient size and/or use of iterative reconstruction technique. CONTRAST:  81mL OMNIPAQUE IOHEXOL 300 MG/ML  SOLN COMPARISON:  None Available. FINDINGS: Brain: There is a 3.6 x 2.8 cm mass in the right cerebellum with surrounding vasogenic edema that causes mass effect on the cerebral aqueduct and fourth ventricle and causes moderate noncommunicating hydrocephalus of the lateral and third ventricles. There is periventricular hypoattenuation compatible with transependymal interstitial edema. There is a right parafalcine mass measuring 1.2 x 1.0 cm. Vascular: No hyperdense vessel or unexpected calcification. Visible vessels are patent. Skull: Normal. Negative for fracture or focal lesion. Sinuses/Orbits: No acute finding. Other: None. IMPRESSION: 1. Moderate  communicating hydrocephalus with obstruction at the level of the cerebral aqueduct due to mass effect from right cerebellar/posterior fossa mass, which measures 3.6 x 2.8 cm. Neurosurgery consultation recommended. 2. 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 MRI with and without contrast would provide better characterization of both lesions. Electronically Signed   By: Ulyses Jarred M.D.   On: 11/10/2022 22:46    Review of Systems  Gastrointestinal:  Positive for nausea.  Neurological:  Positive for headaches.   Blood pressure (!) 104/52, pulse 64, temperature 97.7 F (36.5 C), temperature source Oral, resp. rate 18, height 5\' 4"  (1.626 m), weight 45.4 kg, last menstrual period 11/08/2022, SpO2 97 %. Physical Exam Neurological:     Comments: Patient is awake and alert pupils are equal extract movements are intact cranial nerves are intact strength is 5 of 5 upper and lower extremities.  Assessment/Plan: 47 year old female with right cerebellar mass history of breast cancer mild to moderate ventriculomegaly and obstructive hydrocephalus.  Patient is awake and alert does not need drain placement right now does report headache is better we will continue IV Decadron I have discussed this with Dr. Christella Noa who will evaluate the patient and will plan surgical resection.  Do think the patient might be better served if a bed opens up on 4 N progressive in the neurounit.  Elaina Hoops 11/11/2022, 4:37 PM

## 2022-11-11 NOTE — H&P (Signed)
History and Physical    Patient: Sara [name] [id number] DOB: 1976-05-23 DOA: 11/10/2022 DOS: the patient was seen and examined on 11/11/2022 PCP: Neale Burly, MD  Patient coming from: Home  Chief Complaint:  Chief Complaint  Patient presents with   Headache   HPI: Sara [name] is a 47 y.o. female with medical history significant of HER2+ metastatic right breast cancer who presents to the emergency department due to 1 month history of persistent and severe headache which recently worsened.  This was associated with occasional blurry vision.  She complained of neck stiffness and tingling of upper extremities without weakness/numbness.  She endorsed occasional vomiting.  Patient states that she went for a PET scan yesterday and decided to go to the emergency department for evaluation of the headache.  She denies fever, chest pain, shortness of breath.  Last chemotherapy was July 2023.  Patient states that she has lost about 35 pounds in 6 months unintentionally.  ED Course:  In the emergency department, she was hemodynamically stable with normal vital signs.  Workup in the ED showed normal CBC except for WBC of 11.1, BMP showed sodium 138, potassium 3.8, chloride 94, bicarb 26, blood glucose 106, BUN 24, creatinine 0.61, pregnancy test was negative. CT head without and with contrast showed 1. 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 Neurosurgery consultation recommended. 2. 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 MRI with and without contrast would provide better characterization of both lesions. She was treated with Reglan, Benadryl and Tylenol.  Neurosurgery PA at [facility] was consulted and recommended admitting patient to [facility] with plan to consult on patient on arrival to [facility].  Hospitalist was  asked to admit patient for further evaluation and management.  Review of Systems: Review of systems as noted in the HPI. All other systems reviewed and are negative.   Past Medical History:  Diagnosis Date   Anxiety    Back pain    Cancer [facility])    Past Surgical History:  Procedure Laterality Date   CHOLECYSTECTOMY     ORTHOPEDIC SURGERY     PORTACATH PLACEMENT Left 11/18/2021   Procedure: INSERTION PORT-A-CATH;  Surgeon: Virl Cagey, MD;  Location: [facility];  Service: General;  Laterality: Left;    Social History:  reports that she quit smoking about a year ago. Her smoking use included cigarettes. She has a 5.00 pack-year smoking history. She has never used smokeless tobacco. She reports that she does not drink alcohol and does not use drugs.   Allergies  Allergen Reactions   Hydrocodone Nausea And Vomiting   Sulfa Antibiotics Nausea And Vomiting   Morphine And Related Rash    Family History  Problem Relation Age of Onset   Cancer Paternal Grandfather    Cancer Paternal Grandmother        lung   Heart disease Brother    Asthma Daughter    Asthma Daughter       Prior to Admission medications   Not on File    Physical Exam: BP (!) 131/95   Pulse 70   Temp 99 F (37.2 C) (Oral)   Resp 18   Ht 5\' 4"  (1.626 m)   Wt 45.4 kg   LMP 11/08/2022   SpO2 96%   BMI 17.16 kg/m   General: 47  y.o. year-old female ill appearing, but in no acute distress.  Alert and oriented x3. HEENT: NCAT, EOMI, dry mucous membrane Neck: Mild stiffness in neck Mild stiffness in neck.  Normal ROM. trachea medial Cardiovascular: Regular rate and rhythm with no rubs or gallops.  No thyromegaly or JVD noted.  No lower extremity edema. 2/4 pulses in all 4 extremities. Respiratory: Clear to auscultation with no wheezes or rales. Good inspiratory effort. Abdomen: Soft, nontender nondistended with normal bowel sounds x4 quadrants. Muskuloskeletal: No cyanosis, clubbing or edema noted  bilaterally Neuro: CN II-XII intact, strength 5/5 x 4, sensation, reflexes intact Skin: No ulcerative lesions noted or rashes Psychiatry: Judgement and insight appear normal. Mood is appropriate for condition and setting          Labs on Admission:  Basic Metabolic Panel: Recent Labs  Lab 11/10/22 2005  NA 130*  K 3.8  CL 94*  CO2 26  GLUCOSE 106*  BUN 24*  CREATININE 0.61  CALCIUM 9.7   Liver Function Tests: No results for input(s): "AST", "ALT", "ALKPHOS", "BILITOT", "PROT", "ALBUMIN" in the last 168 hours. No results for input(s): "LIPASE", "AMYLASE" in the last 168 hours. No results for input(s): "AMMONIA" in the last 168 hours. CBC: Recent Labs  Lab 11/10/22 2005  WBC 11.1*  HGB 15.0  HCT 42.7  MCV 91.0  PLT 295   Cardiac Enzymes: No results for input(s): "CKTOTAL", "CKMB", "CKMBINDEX", "TROPONINI" in the last 168 hours.  BNP (last 3 results) No results for input(s): "BNP" in the last 8760 hours.  ProBNP (last 3 results) No results for input(s): "PROBNP" in the last 8760 hours.  CBG: No results for input(s): "GLUCAP" in the last 168 hours.  Radiological Exams on Admission: CT Head W or Wo Contrast  Result Date: 11/10/2022 CLINICAL DATA:  Headache with history of metastatic breast cancer EXAM: CT HEAD WITHOUT AND WITH CONTRAST TECHNIQUE: Contiguous axial images were obtained from the base of the skull through the vertex without and with intravenous contrast. RADIATION DOSE REDUCTION: This exam was performed according to the departmental dose-optimization program which includes automated exposure control, adjustment of the mA and/or kV according to patient size and/or use of iterative reconstruction technique. CONTRAST:  91mL OMNIPAQUE IOHEXOL 300 MG/ML  SOLN COMPARISON:  None Available. FINDINGS: Brain: There is a 3.6 x 2.8 cm mass in the right cerebellum with surrounding vasogenic edema that causes mass effect on the cerebral aqueduct and fourth ventricle and  causes moderate noncommunicating hydrocephalus of the lateral and third ventricles. There is periventricular hypoattenuation compatible with transependymal interstitial edema. There is a right parafalcine mass measuring 1.2 x 1.0 cm. Vascular: No hyperdense vessel or unexpected calcification. Visible vessels are patent. Skull: Normal. Negative for fracture or focal lesion. Sinuses/Orbits: No acute finding. Other: None. IMPRESSION: 1. 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 Neurosurgery consultation recommended. 2. 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 MRI with and without contrast would provide better characterization of both lesions. Electronically Signed   By: Ulyses Jarred M.D.   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 due to mass effect from right cerebellar/posterior fossa mass, which measures 3.6 x 2.8 cm on the second mass suggestive to be meningioma Neurosurgery PA at [facility] was consulted and recommended admitting patient to [facility] by EDP Reglan, Benadryl and Tylenol was given with slight alleviation of patient's headache IV Toradol, dexamethasone and Fioricet was given due to persistent headache  Metastatic Right breast cancer Patient was currently not on chemotherapy  Dehydration Continue gentle  hydration   DVT prophylaxis: SCDs  Code Status: Full code  Consults: Neurosurgery by EDP  Family Communication: None at bedside  Severity of Illness: The appropriate patient status for this patient is INPATIENT. Inpatient status is judged to be reasonable and necessary in order to provide the required intensity of service to ensure the patient's safety. The patient's presenting symptoms, physical exam findings, and initial radiographic and laboratory data in the context of their chronic comorbidities is felt to place them at high risk for further clinical deterioration. Furthermore, it is not anticipated that the patient will be medically stable for discharge from the hospital within 2 midnights of admission.   * I certify that at the point of admission it is my clinical judgment that the patient will require inpatient hospital care spanning beyond 2 midnights from the point of admission due to high intensity of service, high risk for further deterioration and high frequency of surveillance required.*  Author: Bernadette Hoit, DO 11/11/2022 7:04 AM  For on call review [email or web address].

## 2022-11-12 ENCOUNTER — Other Ambulatory Visit: Payer: Self-pay

## 2022-11-12 ENCOUNTER — Inpatient Hospital Stay (HOSPITAL_COMMUNITY)
Admission: EM | Disposition: A | Discharge status: Home / Self Care | Payer: Self-pay | Source: Home / Self Care | Attending: Family Medicine

## 2022-11-12 ENCOUNTER — Encounter (HOSPITAL_COMMUNITY): Payer: Self-pay | Admitting: Internal Medicine

## 2022-11-12 ENCOUNTER — Inpatient Hospital Stay (HOSPITAL_COMMUNITY): Payer: Medicaid Other | Admitting: Certified Registered Nurse Anesthetist

## 2022-11-12 DIAGNOSIS — Z87891 Personal history of nicotine dependence: Secondary | ICD-10-CM

## 2022-11-12 DIAGNOSIS — G919 Hydrocephalus, unspecified: Secondary | ICD-10-CM

## 2022-11-12 DIAGNOSIS — G9389 Other specified disorders of brain: Secondary | ICD-10-CM

## 2022-11-12 DIAGNOSIS — F419 Anxiety disorder, unspecified: Secondary | ICD-10-CM | POA: Diagnosis not present

## 2022-11-12 DIAGNOSIS — C7931 Secondary malignant neoplasm of brain: Secondary | ICD-10-CM | POA: Diagnosis present

## 2022-11-12 DIAGNOSIS — C50911 Malignant neoplasm of unspecified site of right female breast: Secondary | ICD-10-CM | POA: Diagnosis present

## 2022-11-12 DIAGNOSIS — R519 Headache, unspecified: Secondary | ICD-10-CM | POA: Diagnosis not present

## 2022-11-12 HISTORY — PX: SUBOCCIPITAL CRANIECTOMY CERVICAL LAMINECTOMY: SHX5404

## 2022-11-12 LAB — BASIC METABOLIC PANEL
Anion gap: 9 (ref 5–15)
BUN: 12 mg/dL (ref 6–20)
CO2: 24 mmol/L (ref 22–32)
Calcium: 9.2 mg/dL (ref 8.9–10.3)
Chloride: 101 mmol/L (ref 98–111)
Creatinine, Ser: 0.72 mg/dL (ref 0.44–1.00)
GFR, Estimated: >60 mL/min (ref >60)
Glucose, Bld: 139 mg/dL — ABNORMAL HIGH (ref 70–99)
Potassium: 3.8 mmol/L (ref 3.5–5.1)
Sodium: 134 mmol/L — ABNORMAL LOW (ref 135–145)

## 2022-11-12 LAB — CBC
HCT: 36.6 % (ref 36.0–46.0)
HCT: 32 % — ABNORMAL LOW (ref 36.0–46.0)
Hemoglobin: 13 g/dL (ref 12.0–15.0)
Hemoglobin: 11.6 g/dL — ABNORMAL LOW (ref 12.0–15.0)
MCH: 32.6 pg (ref 26.0–34.0)
MCH: 33 pg (ref 26.0–34.0)
MCHC: 35.5 g/dL (ref 30.0–36.0)
MCHC: 36.3 g/dL — ABNORMAL HIGH (ref 30.0–36.0)
MCV: 91.7 fL (ref 80.0–100.0)
MCV: 90.9 fL (ref 80.0–100.0)
Platelets: 244 10*3/uL (ref 150–400)
Platelets: 242 10*3/uL (ref 150–400)
RBC: 3.99 MIL/uL (ref 3.87–5.11)
RBC: 3.52 MIL/uL — ABNORMAL LOW (ref 3.87–5.11)
RDW: 12.2 % (ref 11.5–15.5)
RDW: 12.6 % (ref 11.5–15.5)
WBC: 9.3 10*3/uL (ref 4.0–10.5)
WBC: 9.5 10*3/uL (ref 4.0–10.5)
nRBC: 0 % (ref 0.0–0.2)
nRBC: 0 % (ref 0.0–0.2)

## 2022-11-12 LAB — COMPREHENSIVE METABOLIC PANEL
ALT: 585 U/L — ABNORMAL HIGH (ref 0–44)
AST: 595 U/L — ABNORMAL HIGH (ref 15–41)
Albumin: 3.8 g/dL (ref 3.5–5.0)
Alkaline Phosphatase: 66 U/L (ref 38–126)
Anion gap: 8 (ref 5–15)
BUN: 12 mg/dL (ref 6–20)
CO2: 27 mmol/L (ref 22–32)
Calcium: 9.6 mg/dL (ref 8.9–10.3)
Chloride: 98 mmol/L (ref 98–111)
Creatinine, Ser: 0.74 mg/dL (ref 0.44–1.00)
GFR, Estimated: >60 mL/min (ref >60)
Glucose, Bld: 137 mg/dL — ABNORMAL HIGH (ref 70–99)
Potassium: 5.1 mmol/L (ref 3.5–5.1)
Sodium: 133 mmol/L — ABNORMAL LOW (ref 135–145)
Total Bilirubin: 1.1 mg/dL (ref 0.3–1.2)
Total Protein: 7.1 g/dL (ref 6.5–8.1)

## 2022-11-12 LAB — MRSA NEXT GEN BY PCR, NASAL: MRSA by PCR Next Gen: NOT DETECTED

## 2022-11-12 LAB — TYPE AND SCREEN
ABO/RH(D): A POS
Antibody Screen: NEGATIVE

## 2022-11-12 LAB — MAGNESIUM: Magnesium: 2.1 mg/dL (ref 1.7–2.4)

## 2022-11-12 LAB — PHOSPHORUS: Phosphorus: 3.9 mg/dL (ref 2.5–4.6)

## 2022-11-12 SURGERY — SUBOCCIPITAL CRANIECTOMY CERVICAL LAMINECTOMY/DURAPLASTY
Anesthesia: General

## 2022-11-12 MED ORDER — PROMETHAZINE HCL 25 MG/ML IJ SOLN: 6.2500 mg | INTRAMUSCULAR | Status: DC | PRN

## 2022-11-12 MED ORDER — ALBUMIN HUMAN 5 % IV SOLN: INTRAVENOUS | Status: DC | PRN

## 2022-11-12 MED ORDER — ONDANSETRON HCL 4 MG PO TABS
4.0000 mg | ORAL_TABLET | ORAL | Status: DC | PRN
  Administered 2022-11-15: 4 mg via ORAL
  Filled 2022-11-12: qty 1

## 2022-11-12 MED ORDER — ORAL CARE MOUTH RINSE: 15.0000 mL | Freq: Once | OROMUCOSAL | Status: AC

## 2022-11-12 MED ORDER — THROMBIN (RECOMBINANT) 5000 UNITS EX SOLR
CUTANEOUS | Status: DC | PRN
  Administered 2022-11-12 (×2): 10 mL via TOPICAL

## 2022-11-12 MED ORDER — LIDOCAINE-EPINEPHRINE 1 %-1:100000 IJ SOLN
INTRAMUSCULAR | Status: AC
  Filled 2022-11-12: qty 1

## 2022-11-12 MED ORDER — HYDROMORPHONE HCL 1 MG/ML IJ SOLN
0.5000 mg | INTRAMUSCULAR | Status: DC | PRN
  Administered 2022-11-14 – 2022-11-16 (×18): 0.5 mg via INTRAVENOUS
  Filled 2022-11-12: qty 1
  Filled 2022-11-12: qty 0.5
  Filled 2022-11-12 (×6): qty 1
  Filled 2022-11-12: qty 0.5
  Filled 2022-11-12: qty 1
  Filled 2022-11-12: qty 0.5
  Filled 2022-11-12: qty 1
  Filled 2022-11-12 (×3): qty 0.5
  Filled 2022-11-12: qty 1
  Filled 2022-11-12: qty 0.5
  Filled 2022-11-12: qty 1

## 2022-11-12 MED ORDER — POTASSIUM CHLORIDE IN NACL 20-0.9 MEQ/L-% IV SOLN
INTRAVENOUS | Status: DC
  Filled 2022-11-12 (×3): qty 1000

## 2022-11-12 MED ORDER — ROCURONIUM BROMIDE 10 MG/ML (PF) SYRINGE
PREFILLED_SYRINGE | INTRAVENOUS | Status: AC
  Filled 2022-11-12: qty 10

## 2022-11-12 MED ORDER — PROMETHAZINE HCL 25 MG PO TABS: 12.5000 mg | ORAL_TABLET | ORAL | Status: DC | PRN

## 2022-11-12 MED ORDER — 0.9 % SODIUM CHLORIDE (POUR BTL) OPTIME
TOPICAL | Status: DC | PRN
  Administered 2022-11-12: 3000 mL

## 2022-11-12 MED ORDER — PHENYLEPHRINE HCL-NACL 20-0.9 MG/250ML-% IV SOLN
INTRAVENOUS | Status: DC | PRN
  Administered 2022-11-12: 20 ug/min via INTRAVENOUS

## 2022-11-12 MED ORDER — HYDROCODONE-ACETAMINOPHEN 5-325 MG PO TABS
1.0000 | ORAL_TABLET | ORAL | Status: DC | PRN
  Administered 2022-11-12: 1 via ORAL
  Filled 2022-11-12: qty 1

## 2022-11-12 MED ORDER — AMISULPRIDE (ANTIEMETIC) 5 MG/2ML IV SOLN
10.0000 mg | Freq: Once | INTRAVENOUS | Status: AC
  Filled 2022-11-12: qty 4

## 2022-11-12 MED ORDER — ACETAMINOPHEN 10 MG/ML IV SOLN
INTRAVENOUS | Status: AC
  Filled 2022-11-12: qty 100

## 2022-11-12 MED ORDER — ROCURONIUM BROMIDE 10 MG/ML (PF) SYRINGE
PREFILLED_SYRINGE | INTRAVENOUS | Status: DC | PRN
  Administered 2022-11-12 (×2): 20 mg via INTRAVENOUS
  Administered 2022-11-12: 40 mg via INTRAVENOUS
  Administered 2022-11-12: 20 mg via INTRAVENOUS

## 2022-11-12 MED ORDER — CHLORHEXIDINE GLUCONATE CLOTH 2 % EX PADS
6.0000 | MEDICATED_PAD | Freq: Every day | CUTANEOUS | Status: DC
  Administered 2022-11-12 – 2022-11-17 (×5): 6 via TOPICAL

## 2022-11-12 MED ORDER — CHLORHEXIDINE GLUCONATE 0.12 % MT SOLN: 15.0000 mL | Freq: Once | OROMUCOSAL | Status: AC

## 2022-11-12 MED ORDER — SUGAMMADEX SODIUM 200 MG/2ML IV SOLN
INTRAVENOUS | Status: DC | PRN
  Administered 2022-11-12: 150 mg via INTRAVENOUS

## 2022-11-12 MED ORDER — SODIUM CHLORIDE 0.9 % IV SOLN: INTRAVENOUS | Status: DC | PRN

## 2022-11-12 MED ORDER — DEXAMETHASONE SODIUM PHOSPHATE 10 MG/ML IJ SOLN
INTRAMUSCULAR | Status: AC
  Filled 2022-11-12: qty 1

## 2022-11-12 MED ORDER — DEXAMETHASONE SODIUM PHOSPHATE 10 MG/ML IJ SOLN
INTRAMUSCULAR | Status: DC | PRN
  Administered 2022-11-12: 10 mg via INTRAVENOUS

## 2022-11-12 MED ORDER — LIDOCAINE 2% (20 MG/ML) 5 ML SYRINGE
INTRAMUSCULAR | Status: DC | PRN
  Administered 2022-11-12: 100 mg via INTRAVENOUS

## 2022-11-12 MED ORDER — DEXAMETHASONE SODIUM PHOSPHATE 4 MG/ML IJ SOLN
4.0000 mg | Freq: Four times a day (QID) | INTRAMUSCULAR | Status: AC
  Administered 2022-11-13 – 2022-11-14 (×4): 4 mg via INTRAVENOUS
  Filled 2022-11-12 (×4): qty 1

## 2022-11-12 MED ORDER — CHLORHEXIDINE GLUCONATE 0.12 % MT SOLN
OROMUCOSAL | Status: AC
  Administered 2022-11-12: 15 mL via OROMUCOSAL
  Filled 2022-11-12: qty 15

## 2022-11-12 MED ORDER — LIDOCAINE-EPINEPHRINE 1 %-1:100000 IJ SOLN
INTRAMUSCULAR | Status: DC | PRN
  Administered 2022-11-12: 10 mL

## 2022-11-12 MED ORDER — BACITRACIN ZINC 500 UNIT/GM EX OINT
TOPICAL_OINTMENT | CUTANEOUS | Status: AC
  Filled 2022-11-12: qty 28.35

## 2022-11-12 MED ORDER — ONDANSETRON HCL 4 MG/2ML IJ SOLN
INTRAMUSCULAR | Status: DC | PRN
  Administered 2022-11-12: 4 mg via INTRAVENOUS

## 2022-11-12 MED ORDER — FENTANYL CITRATE (PF) 100 MCG/2ML IJ SOLN: 25.0000 ug | INTRAMUSCULAR | Status: DC | PRN

## 2022-11-12 MED ORDER — ONDANSETRON HCL 4 MG/2ML IJ SOLN
INTRAMUSCULAR | Status: AC
  Filled 2022-11-12: qty 2

## 2022-11-12 MED ORDER — SODIUM CHLORIDE 0.9 % IV SOLN: INTRAVENOUS | Status: DC

## 2022-11-12 MED ORDER — BUPIVACAINE HCL (PF) 0.25 % IJ SOLN
INTRAMUSCULAR | Status: AC
  Filled 2022-11-12: qty 30

## 2022-11-12 MED ORDER — DOCUSATE SODIUM 100 MG PO CAPS
100.0000 mg | ORAL_CAPSULE | Freq: Two times a day (BID) | ORAL | Status: DC
  Administered 2022-11-13 – 2022-11-16 (×8): 100 mg via ORAL
  Filled 2022-11-12 (×9): qty 1

## 2022-11-12 MED ORDER — SCOPOLAMINE 1 MG/3DAYS TD PT72
1.0000 | MEDICATED_PATCH | TRANSDERMAL | Status: DC
  Administered 2022-11-12: 1.5 mg via TRANSDERMAL
  Filled 2022-11-12: qty 1

## 2022-11-12 MED ORDER — FENTANYL CITRATE (PF) 250 MCG/5ML IJ SOLN
INTRAMUSCULAR | Status: AC
  Filled 2022-11-12: qty 5

## 2022-11-12 MED ORDER — CEFAZOLIN SODIUM-DEXTROSE 2-3 GM-%(50ML) IV SOLR
INTRAVENOUS | Status: DC | PRN
  Administered 2022-11-12: 2 g via INTRAVENOUS

## 2022-11-12 MED ORDER — SODIUM CHLORIDE 0.9% FLUSH: 10.0000 mL | INTRAVENOUS | Status: DC | PRN

## 2022-11-12 MED ORDER — SUCCINYLCHOLINE CHLORIDE 200 MG/10ML IV SOSY
PREFILLED_SYRINGE | INTRAVENOUS | Status: DC | PRN
  Administered 2022-11-12: 120 mg via INTRAVENOUS

## 2022-11-12 MED ORDER — AMISULPRIDE (ANTIEMETIC) 5 MG/2ML IV SOLN: 10.0000 mg | Freq: Once | INTRAVENOUS | Status: DC | PRN

## 2022-11-12 MED ORDER — PHENYLEPHRINE 80 MCG/ML (10ML) SYRINGE FOR IV PUSH (FOR BLOOD PRESSURE SUPPORT)
PREFILLED_SYRINGE | INTRAVENOUS | Status: AC
  Filled 2022-11-12: qty 10

## 2022-11-12 MED ORDER — LEVETIRACETAM IN NACL 500 MG/100ML IV SOLN
500.0000 mg | Freq: Two times a day (BID) | INTRAVENOUS | Status: DC
  Administered 2022-11-12 – 2022-11-13 (×2): 500 mg via INTRAVENOUS
  Filled 2022-11-12 (×2): qty 100

## 2022-11-12 MED ORDER — PHENYLEPHRINE 80 MCG/ML (10ML) SYRINGE FOR IV PUSH (FOR BLOOD PRESSURE SUPPORT)
PREFILLED_SYRINGE | INTRAVENOUS | Status: DC | PRN
  Administered 2022-11-12 (×3): 40 ug via INTRAVENOUS
  Administered 2022-11-12: 80 ug via INTRAVENOUS
  Administered 2022-11-12: 40 ug via INTRAVENOUS

## 2022-11-12 MED ORDER — THROMBIN 5000 UNITS EX SOLR
CUTANEOUS | Status: AC
  Filled 2022-11-12: qty 10000

## 2022-11-12 MED ORDER — SODIUM CHLORIDE 0.9 % IV SOLN
25.0000 mg | Freq: Four times a day (QID) | INTRAVENOUS | Status: DC | PRN
  Administered 2022-11-12: 25 mg via INTRAVENOUS
  Filled 2022-11-12: qty 1

## 2022-11-12 MED ORDER — DEXAMETHASONE SODIUM PHOSPHATE 4 MG/ML IJ SOLN
4.0000 mg | Freq: Three times a day (TID) | INTRAMUSCULAR | Status: DC
  Administered 2022-11-14 – 2022-11-15 (×2): 4 mg via INTRAVENOUS
  Filled 2022-11-12 (×2): qty 1

## 2022-11-12 MED ORDER — LABETALOL HCL 5 MG/ML IV SOLN: 10.0000 mg | INTRAVENOUS | Status: DC | PRN

## 2022-11-12 MED ORDER — SODIUM CHLORIDE 0.9 % IV SOLN
0.1500 ug/kg/min | INTRAVENOUS | Status: DC
  Administered 2022-11-12: .15 ug/kg/min via INTRAVENOUS
  Filled 2022-11-12 (×2): qty 2000

## 2022-11-12 MED ORDER — SODIUM CHLORIDE 0.9% FLUSH
10.0000 mL | Freq: Two times a day (BID) | INTRAVENOUS | Status: DC
  Administered 2022-11-12 – 2022-11-13 (×3): 10 mL
  Administered 2022-11-13: 20 mL
  Administered 2022-11-14 – 2022-11-17 (×7): 10 mL

## 2022-11-12 MED ORDER — DEXAMETHASONE SODIUM PHOSPHATE 10 MG/ML IJ SOLN
6.0000 mg | Freq: Four times a day (QID) | INTRAMUSCULAR | Status: AC
  Administered 2022-11-12 – 2022-11-13 (×4): 6 mg via INTRAVENOUS
  Filled 2022-11-12 (×4): qty 1

## 2022-11-12 MED ORDER — CEFAZOLIN SODIUM-DEXTROSE 2-4 GM/100ML-% IV SOLN
2.0000 g | Freq: Three times a day (TID) | INTRAVENOUS | Status: AC
  Administered 2022-11-12 – 2022-11-13 (×2): 2 g via INTRAVENOUS
  Filled 2022-11-12 (×2): qty 100

## 2022-11-12 MED ORDER — BACITRACIN ZINC 500 UNIT/GM EX OINT
TOPICAL_OINTMENT | CUTANEOUS | Status: DC | PRN
  Administered 2022-11-12: 1 via TOPICAL

## 2022-11-12 MED ORDER — FENTANYL CITRATE (PF) 250 MCG/5ML IJ SOLN
INTRAMUSCULAR | Status: DC | PRN
  Administered 2022-11-12: 100 ug via INTRAVENOUS

## 2022-11-12 MED ORDER — PROPOFOL 10 MG/ML IV BOLUS
INTRAVENOUS | Status: DC | PRN
  Administered 2022-11-12: 50 mg via INTRAVENOUS
  Administered 2022-11-12: 140 mg via INTRAVENOUS

## 2022-11-12 MED ORDER — LIDOCAINE 2% (20 MG/ML) 5 ML SYRINGE
INTRAMUSCULAR | Status: AC
  Filled 2022-11-12: qty 5

## 2022-11-12 MED ORDER — AMISULPRIDE (ANTIEMETIC) 5 MG/2ML IV SOLN
INTRAVENOUS | Status: AC
  Administered 2022-11-12: 10 mg via INTRAVENOUS
  Filled 2022-11-12: qty 4

## 2022-11-12 MED ORDER — DIPHENHYDRAMINE HCL 50 MG/ML IJ SOLN
25.0000 mg | Freq: Once | INTRAMUSCULAR | Status: AC
  Administered 2022-11-13: 50 mg via INTRAVENOUS
  Filled 2022-11-12: qty 1

## 2022-11-12 MED ORDER — PROPOFOL 10 MG/ML IV BOLUS
INTRAVENOUS | Status: AC
  Filled 2022-11-12: qty 20

## 2022-11-12 MED ORDER — ONDANSETRON HCL 4 MG/2ML IJ SOLN
4.0000 mg | INTRAMUSCULAR | Status: DC | PRN
  Administered 2022-11-12 – 2022-11-14 (×4): 4 mg via INTRAVENOUS
  Filled 2022-11-12 (×4): qty 2

## 2022-11-12 MED ORDER — PANTOPRAZOLE SODIUM 40 MG IV SOLR
40.0000 mg | Freq: Every day | INTRAVENOUS | Status: DC
  Administered 2022-11-12: 40 mg via INTRAVENOUS
  Filled 2022-11-12: qty 10

## 2022-11-12 MED ORDER — HEMOSTATIC AGENTS (NO CHARGE) OPTIME
TOPICAL | Status: DC | PRN
  Administered 2022-11-12: 1 via TOPICAL

## 2022-11-12 SURGICAL SUPPLY — 72 items
ADH SKN CLS APL DERMABOND .7 (GAUZE/BANDAGES/DRESSINGS)
APL SKNCLS STERI-STRIP NONHPOA (GAUZE/BANDAGES/DRESSINGS) ×1
BAG COUNTER SPONGE SURGICOUNT (BAG) ×1 IMPLANT
BAG SPNG CNTER NS LX DISP (BAG) ×2
BALL CTTN LRG ABS STRL LF (GAUZE/BANDAGES/DRESSINGS) ×1
BAND INSRT 18 STRL LF DISP RB (MISCELLANEOUS) ×2
BAND RUBBER #18 3X1/16 STRL (MISCELLANEOUS) IMPLANT
BENZOIN TINCTURE PRP APPL 2/3 (GAUZE/BANDAGES/DRESSINGS) IMPLANT
BLADE CLIPPER SURG (BLADE) ×2 IMPLANT
BLADE SURG 11 STRL SS (BLADE) ×1 IMPLANT
BLADE ULTRA TIP 2M (BLADE) IMPLANT
BUR ACORN 9.0 PRECISION (BURR) ×1 IMPLANT
CANISTER SUCT 3000ML PPV (MISCELLANEOUS) ×1 IMPLANT
CLIP TI MEDIUM 6 (CLIP) IMPLANT
CNTNR URN SCR LID CUP LEK RST (MISCELLANEOUS) IMPLANT
CONT SPEC 4OZ STRL OR WHT (MISCELLANEOUS) ×2
COTTONBALL LRG STERILE PKG (GAUZE/BANDAGES/DRESSINGS) IMPLANT
DERMABOND ADVANCED .7 DNX12 (GAUZE/BANDAGES/DRESSINGS) ×1 IMPLANT
DRAPE LAPAROTOMY 100X72 PEDS (DRAPES) ×1 IMPLANT
DRAPE MICROSCOPE SLANT 54X150 (MISCELLANEOUS) IMPLANT
DRAPE SURG 17X23 STRL (DRAPES) ×2 IMPLANT
DRAPE WARM FLUID 44X44 (DRAPES) ×1 IMPLANT
DRSG OPSITE POSTOP 4X6 (GAUZE/BANDAGES/DRESSINGS) IMPLANT
ELECT REM PT RETURN 9FT ADLT (ELECTROSURGICAL) ×1
ELECTRODE REM PT RTRN 9FT ADLT (ELECTROSURGICAL) ×1 IMPLANT
EVACUATOR 1/8 PVC DRAIN (DRAIN) IMPLANT
EVACUATOR SILICONE 100CC (DRAIN) IMPLANT
FORCEPS BIPOLAR SPETZLER 8 1.0 (NEUROSURGERY SUPPLIES) IMPLANT
GAUZE 4X4 16PLY ~~LOC~~+RFID DBL (SPONGE) IMPLANT
GAUZE SPONGE 4X4 12PLY STRL (GAUZE/BANDAGES/DRESSINGS) IMPLANT
GLOVE BIO SURGEON STRL SZ7 (GLOVE) IMPLANT
GLOVE BIO SURGEON STRL SZ7.5 (GLOVE) IMPLANT
GLOVE BIO SURGEON STRL SZ8 (GLOVE) ×1 IMPLANT
GLOVE BIOGEL PI IND STRL 7.0 (GLOVE) IMPLANT
GLOVE EXAM NITRILE XL STR (GLOVE) IMPLANT
GLOVE INDICATOR 8.5 STRL (GLOVE) ×2 IMPLANT
GOWN STRL REUS W/ TWL LRG LVL3 (GOWN DISPOSABLE) ×1 IMPLANT
GOWN STRL REUS W/ TWL XL LVL3 (GOWN DISPOSABLE) ×1 IMPLANT
GOWN STRL REUS W/TWL 2XL LVL3 (GOWN DISPOSABLE) ×1 IMPLANT
GOWN STRL REUS W/TWL LRG LVL3 (GOWN DISPOSABLE) ×1
GOWN STRL REUS W/TWL XL LVL3 (GOWN DISPOSABLE) ×1
GRAFT DURAGEN MATRIX 2WX2L IMPLANT
HEMOSTAT SURGICEL 2X14 (HEMOSTASIS) ×1 IMPLANT
KIT BASIN OR (CUSTOM PROCEDURE TRAY) ×1 IMPLANT
KIT TURNOVER KIT B (KITS) ×1 IMPLANT
NEEDLE HYPO 22GX1.5 SAFETY (NEEDLE) ×1 IMPLANT
NS IRRIG 1000ML POUR BTL (IV SOLUTION) ×1 IMPLANT
PACK LAMINECTOMY NEURO (CUSTOM PROCEDURE TRAY) ×1 IMPLANT
PAD ARMBOARD 7.5X6 YLW CONV (MISCELLANEOUS) ×3 IMPLANT
PATTIES SURGICAL .5 X.5 (GAUZE/BANDAGES/DRESSINGS) IMPLANT
PATTIES SURGICAL .5 X3 (DISPOSABLE) IMPLANT
PATTIES SURGICAL 1/4 X 3 (GAUZE/BANDAGES/DRESSINGS) ×1 IMPLANT
PATTIES SURGICAL 1X1 (DISPOSABLE) IMPLANT
PIN MAYFIELD SKULL DISP (PIN) ×1 IMPLANT
SOL ELECTROSURG ANTI STICK (MISCELLANEOUS) ×1
SOLUTION ELECTROSURG ANTI STCK (MISCELLANEOUS) ×1 IMPLANT
SPONGE SURGIFOAM ABS GEL SZ50 (HEMOSTASIS) ×1 IMPLANT
SPONGE T-LAP 4X18 ~~LOC~~+RFID (SPONGE) IMPLANT
STAPLER SKIN PROX WIDE 3.9 (STAPLE) IMPLANT
SUT ETHILON 2 0 FS 18 (SUTURE) IMPLANT
SUT ETHILON 3 0 FSL (SUTURE) ×1 IMPLANT
SUT NURALON 4 0 TR CR/8 (SUTURE) ×2 IMPLANT
SUT PROLENE 6 0 BV (SUTURE) IMPLANT
SUT VIC AB 1 CT1 18XBRD ANBCTR (SUTURE) ×1 IMPLANT
SUT VIC AB 1 CT1 8-18 (SUTURE) ×1
SUT VIC AB 2-0 CT1 18 (SUTURE) ×1 IMPLANT
SUT VIC AB 3-0 SH 8-18 (SUTURE) IMPLANT
TOWEL GREEN STERILE (TOWEL DISPOSABLE) ×1 IMPLANT
TOWEL GREEN STERILE FF (TOWEL DISPOSABLE) IMPLANT
TRAY FOLEY MTR SLVR 16FR STAT (SET/KITS/TRAYS/PACK) IMPLANT
UNDERPAD 30X36 HEAVY ABSORB (UNDERPADS AND DIAPERS) IMPLANT
WATER STERILE IRR 1000ML POUR (IV SOLUTION) ×1 IMPLANT

## 2022-11-12 NOTE — Anesthesia Preprocedure Evaluation (Addendum)
Anesthesia Evaluation  Patient identified by MRN, date of birth, ID band Patient awake    Reviewed: Allergy & Precautions, NPO status , Patient's Chart, lab work & pertinent test results  History of Anesthesia Complications Negative for: history of anesthetic complications  Airway Mallampati: II  TM Distance: >3 FB Neck ROM: Full    Dental no notable dental hx. (+) Dental Advisory Given   Pulmonary former smoker   Pulmonary exam normal        Cardiovascular negative cardio ROS Normal cardiovascular exam     Neuro/Psych  PSYCHIATRIC DISORDERS Anxiety     negative neurological ROS     GI/Hepatic negative GI ROS, Neg liver ROS,,,  Endo/Other  negative endocrine ROS    Renal/GU negative Renal ROS     Musculoskeletal negative musculoskeletal ROS (+)    Abdominal   Peds  Hematology negative hematology ROS (+)   Anesthesia Other Findings Metastatic Breast Cancer  Reproductive/Obstetrics                             Anesthesia Physical Anesthesia Plan  ASA: 3  Anesthesia Plan: General   Post-op Pain Management: Tylenol PO (pre-op)* and Toradol IV (intra-op)*   Induction: Intravenous  PONV Risk Score and Plan: 4 or greater and Ondansetron and Dexamethasone  Airway Management Planned: Oral ETT  Additional Equipment: Arterial line  Intra-op Plan:   Post-operative Plan: Possible Post-op intubation/ventilation  Informed Consent: I have reviewed the patients History and Physical, chart, labs and discussed the procedure including the risks, benefits and alternatives for the proposed anesthesia with the patient or authorized representative who has indicated his/her understanding and acceptance.     Dental advisory given  Plan Discussed with: Anesthesiologist and CRNA  Anesthesia Plan Comments:         Anesthesia Quick Evaluation

## 2022-11-12 NOTE — Transfer of Care (Signed)
Immediate Anesthesia Transfer of Care Note  Patient: Sara [name]  Procedure(s) Performed: SUBOCCIPITAL CRANIECTOMY FOR RESECTION OF CEREBELLAR MASS  Patient Location: PACU  Anesthesia Type:General  Level of Consciousness: awake, alert , and oriented  Airway & Oxygen Therapy: Patient Spontanous Breathing  Post-op Assessment: Report given to RN and Post -op Vital signs reviewed and stable  Post vital signs: Reviewed and stable  Last Vitals:  Vitals Value Taken Time  BP 130/84 11/12/22 1731  Temp    Pulse 102 11/12/22 1734  Resp 18 11/12/22 1734  SpO2 97 % 11/12/22 1734  Vitals shown include unvalidated device data.  Last Pain:  Vitals:   11/12/22 1354  TempSrc:   PainSc: 10-Worst pain ever         Complications: No notable events documented.

## 2022-11-12 NOTE — Anesthesia Procedure Notes (Signed)
Arterial Line Insertion Start/End3/27/2024 1:55 PM Performed by: Carolan Clines, CRNA, CRNA  Patient location: Pre-op. Preanesthetic checklist: patient identified, IV checked, site marked, risks and benefits discussed, surgical consent, monitors and equipment checked, pre-op evaluation, timeout performed and anesthesia consent Lidocaine 1% used for infiltration Left, radial was placed Catheter size: 20 G Hand hygiene performed  and maximum sterile barriers used   Attempts: 2 Procedure performed without using ultrasound guided technique. Following insertion, dressing applied and Biopatch. Post procedure assessment: normal and unchanged  Patient tolerated the procedure well with no immediate complications.

## 2022-11-12 NOTE — Anesthesia Procedure Notes (Signed)
Procedure Name: Intubation Date/Time: 11/12/2022 2:19 PM  Performed by: Carolan Clines, CRNAPre-anesthesia Checklist: Patient identified, Emergency Drugs available, Suction available and Patient being monitored Patient Re-evaluated:Patient Re-evaluated prior to induction Oxygen Delivery Method: Circle System Utilized Preoxygenation: Pre-oxygenation with 100% oxygen Induction Type: IV induction, Rapid sequence and Cricoid Pressure applied Laryngoscope Size: Mac and 3 Grade View: Grade I Tube type: Oral Tube size: 7.5 mm Number of attempts: 1 Airway Equipment and Method: Stylet Placement Confirmation: ETT inserted through vocal cords under direct vision, positive ETCO2 and breath sounds checked- equal and bilateral Secured at: 22 cm Tube secured with: Tape Dental Injury: Teeth and Oropharynx as per pre-operative assessment

## 2022-11-12 NOTE — Progress Notes (Signed)
Initial Nutrition Assessment  DOCUMENTATION CODES:   Underweight, suspect malnutrition  INTERVENTION:   - If pt unable to be extubated, recommend initiation of nutrition support.  - Once extubated and diet resumed, recommend Ensure Enlive po TID, each supplement provides 350 kcal and 20 grams of protein  - Recommend MVI with minerals daily  NUTRITION DIAGNOSIS:   Inadequate oral intake related to nausea as evidenced by other (per chart review, 22.9% weight loss in < 1 year).  GOAL:   Patient will meet greater than or equal to 90% of their needs  MONITOR:   PO intake, Labs, Weight trends  REASON FOR ASSESSMENT:   Malnutrition Screening Tool    ASSESSMENT:   47 year old female who presented to the ED on 3/25 with severe headache for 3 weeks. PMH of HER2+ metastatic right breast cancer (last chemotherapy in July 2023). Pt admitted with right cerebellar mass, mild to moderate ventriculomegaly, and obstructive hydrocephalus.  Attempted to speak with pt at bedside. However, pt had already been transported to the OR for suboccipital craniectomy for resection of cerebellar mass. Noted pt was intubated for procedure. Unsure if pt will remain intubated post-op.  Unable to obtain diet and weight history at this time. Per chart review, pt with significant nausea. Pt also reported a 35 lb weight loss over the last 6 months. Reviewed weight history in chart. Pt with a 13.5 kg weight loss since 11/20/21. This is a 22.9% weight loss in less than 1 year which is severe and significant for timeframe. Suspect malnutrition but unable confirm without diet history and/or NFPE.  Pt on a Regular diet since yesterday AM. No meal completions charted. RD will monitor ability to order oral nutrition supplements for pt after she is extubated. If pt remains intubated post-op and is unable to be extubated within 24 hours, recommend initiation of enteral nutrition.  Medications reviewed and include: IV  decadron 10 mg q 6 hours IVF: NS @ 75 ml/hr  Labs reviewed: sodium 133, elevated LFTs  NUTRITION - FOCUSED PHYSICAL EXAM:  Unable to complete at this time. Pt in OR.  Diet Order:   Diet Order             Diet regular Room service appropriate? Yes; Fluid consistency: Thin  Diet effective now                   EDUCATION NEEDS:   Not appropriate for education at this time  Skin:  Skin Assessment: Reviewed RN Assessment  Last BM:  11/10/22  Height:   Ht Readings from Last 1 Encounters:  11/10/22 5\' 4"  (1.626 m)    Weight:   Wt Readings from Last 1 Encounters:  11/10/22 45.4 kg    Ideal Body Weight:  54.5 kg  BMI:  Body mass index is 17.16 kg/m.  Estimated Nutritional Needs:   Kcal:  1700-1900  Protein:  80-95 grams  Fluid:  >1.5 L    Gustavus Bryant, MS, RD, LDN Inpatient Clinical Dietitian Please see AMiON for contact information.

## 2022-11-12 NOTE — Progress Notes (Signed)
Patient ID: Sara [name], female   DOB: Dec 27, 1975, 47 y.o.   MRN: [id number] Due to the patient's persistent nausea and headaches I have discussed this with Dr. Christella Noa we have elected to move forward with performing the surgery today.  So I have extensively gone over the risks and benefits of a suboccipital craniectomy for resection of cerebellar mass with the patient and her sister as well as perioperative course expectations of outcome and alternatives of surgery and they understand and agreed to proceed forward.

## 2022-11-12 NOTE — Op Note (Signed)
Operative diagnosis: Right cerebellar mass consistent with metastatic breast carcinoma.  Postoperative diagnosis: Same.  Procedure: Suboccipital craniectomy for resection of right cerebellar mass.  Surgeon: Mariann Barter.  Assistant: Ashok Pall.  Assistant to: Nash Shearer.  Anesthesia: General.  EBL: Minimal.  Pathology metastatic adenocarcinoma.  HPI: 47 year old female history of breast cancer presented with headaches and nausea vomiting workup revealed large right cerebellar mass with moderate hydrocephalus.  Due to patient's progressive clinical syndrome imaging findings I recommended right cerebellar suboccipital craniectomy for resection I extensively over the risks and benefits of the operation with her as well as perioperative course expectations of outcome and alternatives of surgery and she understood and agreed to proceed forward.  Operative procedure: Patient was brought into the OR was induced under general anesthesia positioned prone in pins the backside of her head neck was prepped and draped in routine sterile fashion and an incision was drawn out from just above the inion to the spinous process of C2 after infiltration of 10 cc lidocaine with epi midline incision was made and Bovie cautery was used to expose the suboccipital as well as the lamina of C1 and C2.  Exposed laterally and then I drilled off the right posterior fossa overlying the cerebellar hemisphere I did take it down and and took it through the foramen magnum and just across the midline.  Then opened up the dura in a cruciate fashion superiorly I exposed up to the level of the transverse sinus.  And since opened up the dura the brain was herniating under pressure but opened up the dura widely then made a corticectomy and identified the capsule of the tumor.  And then working in a 306 degree orientation around the capsule I freed up from the underlying white matter of the cerebellum.  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  I then inspected the resection bed did not see any residual tumor I could clearly visualize the tent and it was free of tumor at at where the tumor was attached.  The cerebellum was widely decompressed after resection of the mass meticulous hemostasis was maintained the wound was copiously irrigated I lined the cavity with Surgicel and then placed a piece of DuraGen plus overlying the dural opening and and Gelfoam overlying the DuraGen and closed the wound in layers with interrupted Vicryl and a running nylon.  At the end the case all needle count sponge counts were correct.  Patient was remitted to recovery in stable condition.

## 2022-11-12 NOTE — Anesthesia Postprocedure Evaluation (Signed)
Anesthesia Post Note  Patient: Sara [name]  Procedure(s) Performed: SUBOCCIPITAL CRANIECTOMY FOR RESECTION OF CEREBELLAR MASS     Patient location during evaluation: PACU Anesthesia Type: General Level of consciousness: sedated Pain management: pain level controlled Vital Signs Assessment: post-procedure vital signs reviewed and stable Respiratory status: spontaneous breathing and respiratory function stable Cardiovascular status: stable Postop Assessment: no apparent nausea or vomiting Anesthetic complications: no   No notable events documented.  Last Vitals:  Vitals:   11/12/22 1830 11/12/22 1853  BP:    Pulse: 87 88  Resp: 12 (!) 22  Temp:    SpO2: 95% 97%    Last Pain:  Vitals:   11/12/22 1800  TempSrc: Axillary  PainSc: 0-No pain                 Correy Weidner DANIEL

## 2022-11-12 NOTE — Progress Notes (Addendum)
PROGRESS NOTE    Sara [name]  [id number] DOB: 1976-01-30 DOA: 11/10/2022 PCP: Neale Burly, MD   Brief Narrative: Sara [name] is a 47 y.o. female with history of HER2 positive metastatic right breast cancer. Patient presented secondary to worsening severe headache.  CT imaging significant for moderate hydrocephalus with obstruction at the level of cerebral aqueduct secondary to mass effect from right cerebellar/posterior fossa mass in addition to evidence of a second mass along the right aspect of the falx cerebri concerning for meningioma.  MRI confirms a 3.8 cm right cerebellar metastatic mass with vasogenic edema and mass effect causing obstructing hydrocephalus in addition to a 1.3 cm metastatic lesion of the right parietal lobe.  Patient started empirically on Decadron IV.  Neurosurgery consulted and recommended transfer to [facility].  No surgery planned for surgical management this admission secondary to ongoing symptoms.   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 Neurosurgery consulted. Decadron IV started. Headache persists today. -Neurosurgery recommendations: plan for surgery today -Continue Decadron IV -Continue dilaudid IV PRN  Metastatic right breast cancer Brain/Liver/Bone metastasis Patient's oncologist is Dr. Delton Coombes. She recently completed 4 cycles of THP chemotherapy. . CT abdomen/pelvis shows no residual hepatic lesions. CT head/MRI brain significant for 3.8 cm right cerebellar metastatic mass with vasogenic edema and mass effect causing obstructing hydrocephalus in addition to a 1.3 cm metastatic lesion of the right parietal lobe.   Elevated AST/ALT Patient with a history of liver metastasis which appears improved on most recent CT imaging. Unclear etiology for AST/ALT rise at this time; normal  bilirubin. Patient is asymptomatic. No ductal dilation noted on imaging. Last CMP from 02/2022 with normal AST/ALT. -Trend CMP; if no improvement, may need to consider MRI abdomen  DVT prophylaxis: SCDs Code Status:   Code Status: Full Code Family Communication: None at bedside Disposition Plan: Discharge pending ongoing neurosurgery recommendations   Consultants:  Neurosurgery Oncology  Procedures:  None  Antimicrobials: None    Subjective: Patient reports continued headache. Some minor relief with analgesic regimen. Patient reports no further nausea/vomiting. No abdominal pain.  Objective: BP (!) 159/96 (BP Location: Left Arm)   Pulse 80   Temp 98.6 F (37 C) (Oral)   Resp 16   Ht 5\' 4"  (1.626 m)   Wt 45.4 kg   LMP 11/08/2022   SpO2 96%   BMI 17.16 kg/m   Examination:  General exam: Appears calm and slightly uncomfortable/in pain Respiratory system: Clear to auscultation. Respiratory effort normal. Cardiovascular system: S1 & S2 heard, RRR. No murmurs, rubs, gallops or clicks. Gastrointestinal system: Abdomen is nondistended, soft and nontender. No organomegaly or masses felt. Diminished bowel sounds heard. Central nervous system: Alert and oriented. No focal neurological deficits. Musculoskeletal: No edema. No calf tenderness Skin: No cyanosis. No rashes Psychiatry: Judgement and insight appear normal. Mood & affect appropriate.    Data Reviewed: I have personally reviewed following labs and imaging studies  CBC Lab Results  Component Value Date   WBC 9.3 11/12/2022   RBC 3.99 11/12/2022   HGB 13.0 11/12/2022   HCT 36.6 11/12/2022   MCV 91.7 11/12/2022   MCH 32.6 11/12/2022   PLT 244 11/12/2022   MCHC 35.5 11/12/2022   RDW 12.2 11/12/2022   LYMPHSABS 1.6 02/25/2022   MONOABS 0.8 02/25/2022   EOSABS 0.0 02/25/2022   BASOSABS 0.1 02/25/2022     Last  metabolic panel Lab Results  Component Value Date   NA 133 (L) 11/12/2022   K 5.1 11/12/2022    CL 98 11/12/2022   CO2 27 11/12/2022   BUN 12 11/12/2022   CREATININE 0.74 11/12/2022   GLUCOSE 137 (H) 11/12/2022   GFRNONAA >60 11/12/2022   GFRAA >60 10/25/2019   CALCIUM 9.6 11/12/2022   PHOS 3.9 11/12/2022   PROT 7.1 11/12/2022   ALBUMIN 3.8 11/12/2022   BILITOT 1.1 11/12/2022   ALKPHOS 66 11/12/2022   AST 595 (H) 11/12/2022   ALT 585 (H) 11/12/2022   ANIONGAP 8 11/12/2022    GFR: Estimated Creatinine Clearance: 63 mL/min (by C-G formula based on SCr of 0.74 mg/dL).  No results found for this or any previous visit (from the past 240 hour(s)).    Radiology Studies: MR Brain W and Wo Contrast  Result Date: 11/11/2022 CLINICAL DATA:  Metastatic disease evaluation EXAM: MRI HEAD WITHOUT AND WITH CONTRAST TECHNIQUE: Multiplanar, multiecho pulse sequences of the brain and surrounding structures were obtained without and with intravenous contrast. CONTRAST:  53mL GADAVIST GADOBUTROL 1 MMOL/ML IV SOLN COMPARISON:  Head CT from yesterday FINDINGS: Brain: 3.8 cm parenchymal mass in the right cerebellum with vasogenic edema. Prominent local mass effect with downward descent of the cerebellar tonsils and fourth ventricular obstruction causing of string hydrocephalus with transependymal CSF flow. Additional 1.3 Cm mass in the parasagittal right parietal lobe attributed to second metastasis. No acute infarct or hemorrhage. Vascular: Major flow voids and vascular enhancement are preserved Skull and upper cervical spine: Normal marrow signal. Sinuses/Orbits: Negative Findings are known to clinical service per notes and neurosurgery is aware. Direction communication with hospitalist is also underway. IMPRESSION: 1. 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 There is downward descent of the tonsils and fourth ventricular obstruction causing hydrocephalus. 2. Second 13 mm metastasis in the right parietal lobe. Electronically Signed   By:  Jorje Guild M.D.   On: 11/11/2022 08:07   CT Head W or Wo Contrast  Result Date: 11/10/2022 CLINICAL DATA:  Headache with history of metastatic breast cancer EXAM: CT HEAD WITHOUT AND WITH CONTRAST TECHNIQUE: Contiguous axial images were obtained from the base of the skull through the vertex without and with intravenous contrast. RADIATION DOSE REDUCTION: This exam was performed according to the departmental dose-optimization program which includes automated exposure control, adjustment of the mA and/or kV according to patient size and/or use of iterative reconstruction technique. CONTRAST:  55mL OMNIPAQUE IOHEXOL 300 MG/ML  SOLN COMPARISON:  None Available. FINDINGS: Brain: There is a 3.6 x 2.8 cm mass in the right cerebellum with surrounding vasogenic edema that causes mass effect on the cerebral aqueduct and fourth ventricle and causes moderate noncommunicating hydrocephalus of the lateral and third ventricles. There is periventricular hypoattenuation compatible with transependymal interstitial edema. There is a right parafalcine mass measuring 1.2 x 1.0 cm. Vascular: No hyperdense vessel or unexpected calcification. Visible vessels are patent. Skull: Normal. Negative for fracture or focal lesion. Sinuses/Orbits: No acute finding. Other: None. IMPRESSION: 1. 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 Neurosurgery consultation recommended. 2. 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 MRI with and without contrast would provide better characterization of both lesions. Electronically Signed   By: Ulyses Jarred M.D.   On:  11/10/2022 22:46      LOS: 1 day    Cordelia Poche, MD [facility] 11/12/2022, 8:34 AM   If 7PM-7AM, please contact night-coverage [email or web address]

## 2022-11-12 NOTE — Progress Notes (Signed)
Subjective: Patient reports  condition nausea headache slightly better she says although does fluctuate.  Objective: Vital signs in last 24 hours: Temp:  [97.7 F (36.5 C)-98.7 F (37.1 C)] 98.4 F (36.9 C) (03/27 1131) Pulse Rate:  [64-96] 92 (03/27 1131) Resp:  [13-20] 18 (03/27 1131) BP: (104-164)/(52-96) 143/96 (03/27 1131) SpO2:  [93 %-100 %] 97 % (03/27 1131)  Intake/Output from previous day: 03/26 0701 - 03/27 0700 In: 51.2 [IV Piggyback:51.2] Out: 3 [Urine:2; Emesis/NG output:1] Intake/Output this shift: No intake/output data recorded.  Patient is awake and alert cranial nerves are intact strength is 5 out of 5 upper and lower extremities.  Lab Results: Recent Labs    11/11/22 1621 11/12/22 0359  WBC 13.2* 9.3  HGB 14.5 13.0  HCT 42.4 36.6  PLT 291 244   BMET Recent Labs    11/11/22 1621 11/12/22 0359  NA 133* 133*  K 3.9 5.1  CL 96* 98  CO2 25 27  GLUCOSE 123* 137*  BUN 15 12  CREATININE 0.80 0.74  CALCIUM 9.6 9.6    Studies/Results: CT CHEST ABDOMEN PELVIS W CONTRAST  Result Date: 11/12/2022 CLINICAL DATA:  Follow-up metastatic breast cancer. Status post chemotherapy. * Tracking Code: BO * EXAM: CT CHEST, ABDOMEN, AND PELVIS WITH CONTRAST TECHNIQUE: Multidetector CT imaging of the chest, abdomen and pelvis was performed following the standard protocol during bolus administration of intravenous contrast. RADIATION DOSE REDUCTION: This exam was performed according to the departmental dose-optimization program which includes automated exposure control, adjustment of the mA and/or kV according to patient size and/or use of iterative reconstruction technique. CONTRAST:  63mL OMNIPAQUE IOHEXOL 300 MG/ML  SOLN COMPARISON:  PET-CT 11/28/2021 FINDINGS: CT CHEST FINDINGS Cardiovascular: The heart is normal in size. No pericardial effusion. The aorta is normal in caliber. No dissection or atherosclerotic calcifications. The branch vessels are patent.  Mediastinum/Nodes: No mediastinal or hilar mass or lymphadenopathy. Small scattered sub 7 mm lymph nodes are noted. The esophagus is unremarkable. Lungs/Pleura: Mild bronchitic type lung changes appear relatively stable and could reflect chronic bronchitis or changes related to smoking. No worrisome pulmonary lesions or pulmonary nodules to suggest pulmonary metastatic disease. No pulmonary infiltrates, pleural effusions or pleural lesions. Musculoskeletal: Significant improved appearance of the right breast. There are small enhancing nodules noted but no recurrent mass or obvious chest wall invasion. No supraclavicular or axillary adenopathy. The lytic destructive bone lesions demonstrate interval healing changes. CT ABDOMEN PELVIS FINDINGS Hepatobiliary: I do not see any residual hepatic lesions suggesting a good response to treatment. Stable mild intra and extrahepatic biliary dilatation likely related to prior cholecystectomy. Pancreas: No mass, inflammation or ductal dilatation. Spleen: Normal size.  No focal lesions. Adrenals/Urinary Tract: The adrenal glands are normal. No renal lesions. The bladder is unremarkable. Stomach/Bowel: The stomach, duodenum, small bowel and colon are unremarkable. Vascular/Lymphatic: The aorta is normal in caliber. No dissection. The branch vessels are patent. The major venous structures are patent. No mesenteric or retroperitoneal mass or adenopathy. Small scattered lymph nodes are noted. Reproductive: Retroverted uterus. No significant uterine or ovarian abnormalities. Other: No pelvic mass or adenopathy. No free pelvic fluid collections. No inguinal mass or adenopathy. No abdominal wall hernia or subcutaneous lesions. Musculoskeletal: The lytic left femoral neck lesion has healed. Other lytic bone lesions involving the spine F healed. No pathologic fracture or spinal canal compromise. IMPRESSION: 1. Significant improved appearance of the right breast. No recurrent mass or  obvious chest wall invasion. There are small enhancing nodules. Recommend close  observation. 2. No supraclavicular, axillary, mediastinal or hilar adenopathy. No findings for pulmonary metastatic disease. 3. No residual hepatic lesions suggesting a good response to treatment. 4. Interval healing changes of the lytic bone lesions. No pathologic fracture or spinal canal compromise. 5. Chronic bronchitic changes in the lungs but no infiltrates or effusions. 6. Stable mild intra and extrahepatic biliary dilatation likely related to prior cholecystectomy. Electronically Signed   By: Marijo Sanes M.D.   On: 11/12/2022 09:12   MR Brain W and Wo Contrast  Result Date: 11/11/2022 CLINICAL DATA:  Metastatic disease evaluation EXAM: MRI HEAD WITHOUT AND WITH CONTRAST TECHNIQUE: Multiplanar, multiecho pulse sequences of the brain and surrounding structures were obtained without and with intravenous contrast. CONTRAST:  39mL GADAVIST GADOBUTROL 1 MMOL/ML IV SOLN COMPARISON:  Head CT from yesterday FINDINGS: Brain: 3.8 cm parenchymal mass in the right cerebellum with vasogenic edema. Prominent local mass effect with downward descent of the cerebellar tonsils and fourth ventricular obstruction causing of string hydrocephalus with transependymal CSF flow. Additional 1.3 Cm mass in the parasagittal right parietal lobe attributed to second metastasis. No acute infarct or hemorrhage. Vascular: Major flow voids and vascular enhancement are preserved Skull and upper cervical spine: Normal marrow signal. Sinuses/Orbits: Negative Findings are known to clinical service per notes and neurosurgery is aware. Direction communication with hospitalist is also underway. IMPRESSION: 1. 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 There is downward descent of the tonsils and fourth ventricular obstruction causing hydrocephalus. 2. Second 13 mm metastasis in the right parietal lobe.  Electronically Signed   By: Jorje Guild M.D.   On: 11/11/2022 08:07   CT Head W or Wo Contrast  Result Date: 11/10/2022 CLINICAL DATA:  Headache with history of metastatic breast cancer EXAM: CT HEAD WITHOUT AND WITH CONTRAST TECHNIQUE: Contiguous axial images were obtained from the base of the skull through the vertex without and with intravenous contrast. RADIATION DOSE REDUCTION: This exam was performed according to the departmental dose-optimization program which includes automated exposure control, adjustment of the mA and/or kV according to patient size and/or use of iterative reconstruction technique. CONTRAST:  65mL OMNIPAQUE IOHEXOL 300 MG/ML  SOLN COMPARISON:  None Available. FINDINGS: Brain: There is a 3.6 x 2.8 cm mass in the right cerebellum with surrounding vasogenic edema that causes mass effect on the cerebral aqueduct and fourth ventricle and causes moderate noncommunicating hydrocephalus of the lateral and third ventricles. There is periventricular hypoattenuation compatible with transependymal interstitial edema. There is a right parafalcine mass measuring 1.2 x 1.0 cm. Vascular: No hyperdense vessel or unexpected calcification. Visible vessels are patent. Skull: Normal. Negative for fracture or focal lesion. Sinuses/Orbits: No acute finding. Other: None. IMPRESSION: 1. 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 Neurosurgery consultation recommended. 2. 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 MRI with and without contrast would provide better characterization of both lesions. Electronically Signed   By: Ulyses Jarred M.D.   On: 11/10/2022 22:46    Assessment/Plan: 47 year old female with cerebellar mass mild to moderate hydrocephalus managing okay with slight improved headaches on steroids but  slight condition of nausea.  Dr. Christella Noa to see evaluate patient plan surgical resection.  LOS: 1 day     Elaina Hoops 11/12/2022, 12:31 PM

## 2022-11-13 ENCOUNTER — Encounter (HOSPITAL_COMMUNITY): Payer: Self-pay | Admitting: Neurosurgery

## 2022-11-13 ENCOUNTER — Ambulatory Visit: Payer: Medicaid Other | Admitting: Hematology

## 2022-11-13 DIAGNOSIS — R519 Headache, unspecified: Secondary | ICD-10-CM | POA: Diagnosis not present

## 2022-11-13 DIAGNOSIS — G9389 Other specified disorders of brain: Secondary | ICD-10-CM | POA: Diagnosis not present

## 2022-11-13 DIAGNOSIS — C50911 Malignant neoplasm of unspecified site of right female breast: Secondary | ICD-10-CM | POA: Diagnosis not present

## 2022-11-13 LAB — HEPATITIS PANEL, ACUTE
HCV Ab: NONREACTIVE
Hep A IgM: NONREACTIVE
Hep B C IgM: NONREACTIVE
Hepatitis B Surface Ag: NONREACTIVE

## 2022-11-13 LAB — HEPATIC FUNCTION PANEL
ALT: 303 U/L — ABNORMAL HIGH (ref 0–44)
AST: 125 U/L — ABNORMAL HIGH (ref 15–41)
Albumin: 3.8 g/dL (ref 3.5–5.0)
Alkaline Phosphatase: 55 U/L (ref 38–126)
Bilirubin, Direct: 0.1 mg/dL (ref 0.0–0.2)
Indirect Bilirubin: 0.8 mg/dL (ref 0.3–0.9)
Total Bilirubin: 0.9 mg/dL (ref 0.3–1.2)
Total Protein: 6.9 g/dL (ref 6.5–8.1)

## 2022-11-13 LAB — BASIC METABOLIC PANEL
Anion gap: 11 (ref 5–15)
BUN: 15 mg/dL (ref 6–20)
CO2: 26 mmol/L (ref 22–32)
Calcium: 9.4 mg/dL (ref 8.9–10.3)
Chloride: 96 mmol/L — ABNORMAL LOW (ref 98–111)
Creatinine, Ser: 0.79 mg/dL (ref 0.44–1.00)
GFR, Estimated: >60 mL/min (ref >60)
Glucose, Bld: 102 mg/dL — ABNORMAL HIGH (ref 70–99)
Potassium: 4.1 mmol/L (ref 3.5–5.1)
Sodium: 133 mmol/L — ABNORMAL LOW (ref 135–145)

## 2022-11-13 MED ORDER — ENSURE ENLIVE PO LIQD
237.0000 mL | Freq: Three times a day (TID) | ORAL | Status: DC
  Administered 2022-11-13 – 2022-11-17 (×9): 237 mL via ORAL

## 2022-11-13 MED ORDER — LORAZEPAM 1 MG PO TABS
1.0000 mg | ORAL_TABLET | ORAL | Status: AC | PRN
  Administered 2022-11-13: 1 mg via ORAL
  Filled 2022-11-13: qty 1

## 2022-11-13 MED ORDER — LEVETIRACETAM 500 MG PO TABS
500.0000 mg | ORAL_TABLET | Freq: Two times a day (BID) | ORAL | Status: DC
  Administered 2022-11-13 – 2022-11-15 (×4): 500 mg via ORAL
  Filled 2022-11-13 (×4): qty 1

## 2022-11-13 MED ORDER — HALOPERIDOL LACTATE 5 MG/ML IJ SOLN
2.0000 mg | Freq: Four times a day (QID) | INTRAMUSCULAR | Status: DC | PRN
  Administered 2022-11-13 – 2022-11-15 (×3): 2 mg via INTRAVENOUS
  Filled 2022-11-13 (×3): qty 1

## 2022-11-13 MED ORDER — LORAZEPAM 2 MG/ML IJ SOLN
1.0000 mg | Freq: Once | INTRAMUSCULAR | Status: AC
  Administered 2022-11-13: 1 mg via INTRAVENOUS
  Filled 2022-11-13: qty 1

## 2022-11-13 MED ORDER — PANTOPRAZOLE SODIUM 40 MG PO TBEC
40.0000 mg | DELAYED_RELEASE_TABLET | Freq: Every day | ORAL | Status: DC
  Administered 2022-11-13 – 2022-11-15 (×3): 40 mg via ORAL
  Filled 2022-11-13 (×3): qty 1

## 2022-11-13 MED FILL — Thrombin For Soln 5000 Unit: CUTANEOUS | Qty: 2 | Status: AC

## 2022-11-13 NOTE — Progress Notes (Signed)
Drs. Saintclair Halsted and Lonny Prude notified of pt's extreme agitation, hallucination, and aggression. 2 mg Haldol ordered and given. Pt assisted with lunch tray at bedside.

## 2022-11-13 NOTE — Progress Notes (Addendum)
PROGRESS NOTE    Sara [name]  [id number] DOB: [birth year] DOA: 11/10/2022 PCP: Neale Burly, MD   Brief Narrative: Sara [name] is a 47 y.o. female with history of HER2 positive metastatic right breast cancer. Patient presented secondary to worsening severe headache.  CT imaging significant for moderate hydrocephalus with obstruction at the level of cerebral aqueduct secondary to mass effect from right cerebellar/posterior fossa mass in addition to evidence of a second mass along the right aspect of the falx cerebri concerning for meningioma.  MRI confirms a 3.8 cm right cerebellar metastatic mass with vasogenic edema and mass effect causing obstructing hydrocephalus in addition to a 1.3 cm metastatic lesion of the right parietal lobe.  Patient started empirically on Decadron IV.  Neurosurgery consulted and recommended transfer to [facility].  No surgery planned for surgical management this admission secondary to ongoing symptoms.   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 Neurosurgery consulted. Decadron IV started. Neurosurgery performed a successful craniectomy for resection of the cerebellar mass on 3/27, confirmed metastatic adenocarcinoma. -Neurosurgery recommendations: pending today -Continue Decadron IV per neurosurgery -Continue dilaudid IV PRN  Metastatic right breast cancer Brain/Liver/Bone metastasis Patient's oncologist is Dr. Delton Coombes. She recently completed 4 cycles of THP chemotherapy. . CT abdomen/pelvis shows no residual hepatic lesions. CT head/MRI brain significant for 3.8 cm right cerebellar metastatic mass with vasogenic edema and mass effect causing obstructing hydrocephalus in addition to a 1.3 cm metastatic lesion of the right parietal lobe.   Elevated AST/ALT Patient with a history of liver  metastasis which appears improved on most recent CT imaging. Unclear etiology for AST/ALT rise at this time; normal bilirubin. Patient is asymptomatic. No ductal dilation noted on imaging. Last CMP from 02/2022 with normal AST/ALT. AST.ALT now trending down.   DVT prophylaxis: SCDs Code Status:   Code Status: Full Code Family Communication: None at bedside Disposition Plan: Discharge pending ongoing neurosurgery recommendations   Consultants:  Neurosurgery Oncology  Procedures:  None  Antimicrobials: None    Subjective: Headache improved today. Had some itching overnight secondary Vicodin. Concern for agitation overnight per nursing.  Objective: BP 111/79   Pulse 88   Temp 98.7 F (37.1 C) (Oral)   Resp 14   Ht 5\' 4"  (1.626 m)   Wt 43.8 kg   LMP 11/08/2022   SpO2 99%   BMI 16.57 kg/m   Examination:  General exam: Appears calm and comfortable Respiratory system: Clear to auscultation. Respiratory effort normal. Cardiovascular system: S1 & S2 heard, RRR. No murmurs, rubs, gallops or clicks. Gastrointestinal system: Abdomen is nondistended, soft and nontender. No organomegaly or masses felt. Normal bowel sounds heard. Central nervous system: Alert and oriented. Musculoskeletal: No edema. No calf tenderness Skin: No cyanosis. No rashes. Posterior scalp incision dressing noted.  Data Reviewed: I have personally reviewed following labs and imaging studies  CBC Lab Results  Component Value Date   WBC 9.5 11/12/2022   RBC 3.52 (L) 11/12/2022   HGB 11.6 (L) 11/12/2022   HCT 32.0 (L) 11/12/2022   MCV 90.9 11/12/2022   MCH 33.0 11/12/2022   PLT 242 11/12/2022   MCHC 36.3 (H) 11/12/2022   RDW 12.6 11/12/2022   LYMPHSABS 1.6 02/25/2022   MONOABS 0.8 02/25/2022   EOSABS 0.0 02/25/2022   BASOSABS 0.1 [id number]     Last metabolic panel Lab Results  Component Value Date  NA 133 (L) 11/13/2022   K 4.1 11/13/2022   CL 96 (L) 11/13/2022   CO2 26 11/13/2022   BUN  15 11/13/2022   CREATININE 0.79 11/13/2022   GLUCOSE 102 (H) 11/13/2022   GFRNONAA >60 11/13/2022   GFRAA >60 10/25/2019   CALCIUM 9.4 11/13/2022   PHOS 3.9 11/12/2022   PROT 6.9 11/13/2022   ALBUMIN 3.8 11/13/2022   BILITOT 0.9 11/13/2022   ALKPHOS 55 11/13/2022   AST 125 (H) 11/13/2022   ALT 303 (H) 11/13/2022   ANIONGAP 11 11/13/2022    GFR: Estimated Creatinine Clearance: 60.8 mL/min (by C-G formula based on SCr of 0.79 mg/dL).  Recent Results (from the past 240 hour(s))  MRSA Next Gen by PCR, Nasal     Status: None   Collection Time: 11/12/22  6:16 PM   Specimen: Nasal Mucosa; Nasal Swab  Result Value Ref Range Status   MRSA by PCR Next Gen NOT DETECTED NOT DETECTED Final    Comment: (NOTE) The GeneXpert MRSA Assay (FDA approved for NASAL specimens only), is one component of a comprehensive MRSA colonization surveillance program. It is not intended to diagnose MRSA infection nor to guide or monitor treatment for MRSA infections. Test performance is not FDA approved in patients less than 75 years old. Performed at [facility], [place] [address], [place], [place] [address]       Radiology Studies: No results found.    LOS: 2 days    Cordelia Poche, MD [facility] 11/13/2022, 8:10 AM   If 7PM-7AM, please contact night-coverage [email or web address]

## 2022-11-13 NOTE — Progress Notes (Signed)
Around 2300, pt began hallucinating bugs in her bed.  She states that they were crawling on her and making her itch.  Pt also hallucinating people and things in her room.  Pt also very restless, anxious, and paranoid.  This RN notified Dr. Arnoldo Morale.  VO for 25-50 mg IV Benadryl.  MD gave this RN a VO for 1 mg of IV Ativan if she does not calm down after the Benadryl.  1mg  Ativan order was placed at 0100.  Upon looking at the chart, the only new medication given to pt was Vicodin.  Pt stated a few hours after Vicodin administration that she got a rash the last time she took it.  There was no rash present during this admission.  Will not give Vicodin again and pass this information along to day shift.    This RN and Agricultural consultant, [place] spoke with a nurse that had previously taken care of pt.  She said she had spoken to the pt's sister and the sister stated pt has hx of substance abuse for over 20 years.  When pt was asked about any home medication or drug use she only admitted to Suboxone a few months ago and cocaine in college.  Pt denied any other drug use.

## 2022-11-13 NOTE — Progress Notes (Signed)
Throughout shift, pt has become increasingly angry and verbally aggressive with nursing staff. Pt throwing items. Pt physical needs have been promptly met, including ordering an additional breakfast tray, multiple cups of coffee, water,and soft drinks as well as bathing, teeth brushing,  linen changes x2, multiple warm blankets, 1 mg Dilaudid x2, 650 mg Tylenol, 4 mg Zofran, and 1 mg Ativan. Pt accusing nursing staff of "punching her in her neck." For safety, 2 members of nursing staff to be present in room for all pt care activities.

## 2022-11-13 NOTE — Progress Notes (Signed)
Nutrition Follow-up  DOCUMENTATION CODES:   Severe malnutrition in context of chronic illness, Underweight  INTERVENTION:   Ensure Enlive po TID, each supplement provides 350 kcal and 20 grams of protein.  Encourage PO intake  NUTRITION DIAGNOSIS:   Severe Malnutrition related to chronic illness as evidenced by severe muscle depletion, severe fat depletion, percent weight loss. Ongoing.   GOAL:   Patient will meet greater than or equal to 90% of their needs Progressing  MONITOR:   PO intake, Supplement acceptance, Weight trends  REASON FOR ASSESSMENT:   Malnutrition Screening Tool    ASSESSMENT:   47 year old female who presented to the ED on 3/25 with severe headache for 3 weeks. PMH of HER2+ metastatic right breast cancer (last chemotherapy in July 2023). Pt admitted with right cerebellar mass, mild to moderate ventriculomegaly, and obstructive hydrocephalus.  Pt discussed during ICU rounds and with RN and MD.  Pt with newly dx metastatic brain lesions s/p craniectomy for resection.   Pt meets criteria for severe malnutrition.  Pt shares that she has lost 25% of her body weight since 11/2021 after her breast cancer dx. Pt reports that her dad prepares her meals and she eats when she can. Noted pt somewhat agitated this am over spilling her coffee x 3.  Gave pt an ensure which she likes, will order.   NUTRITION - FOCUSED PHYSICAL EXAM:  Flowsheet Row Most Recent Value  Orbital Region Severe depletion  Upper Arm Region Severe depletion  Thoracic and Lumbar Region Severe depletion  Buccal Region Severe depletion  Temple Region Moderate depletion  Clavicle Bone Region Severe depletion  Clavicle and Acromion Bone Region Severe depletion  Scapular Bone Region Severe depletion  Dorsal Hand Severe depletion  Patellar Region Severe depletion  Anterior Thigh Region Severe depletion  Posterior Calf Region Severe depletion  Edema (RD Assessment) None  Hair Reviewed   Eyes Reviewed  Mouth Reviewed  Skin Reviewed  Nails Reviewed       Diet Order:   Diet Order             Diet regular Room service appropriate? Yes; Fluid consistency: Thin  Diet effective now                   EDUCATION NEEDS:   Education needs have been addressed  Skin:  Skin Assessment: Reviewed RN Assessment  Last BM:  11/10/22  Height:   Ht Readings from Last 1 Encounters:  11/10/22 5\' 4"  (1.626 m)    Weight:   Wt Readings from Last 1 Encounters:  11/12/22 43.8 kg    Ideal Body Weight:  54.5 kg  BMI:  Body mass index is 16.57 kg/m.  Estimated Nutritional Needs:   Kcal:  1700-1900  Protein:  80-95 grams  Fluid:  >1.5 L  Elfego Giammarino P., RD, LDN, CNSC See AMiON for contact information

## 2022-11-14 ENCOUNTER — Inpatient Hospital Stay (HOSPITAL_COMMUNITY): Payer: Medicaid Other

## 2022-11-14 DIAGNOSIS — E43 Unspecified severe protein-calorie malnutrition: Secondary | ICD-10-CM | POA: Insufficient documentation

## 2022-11-14 DIAGNOSIS — G9389 Other specified disorders of brain: Secondary | ICD-10-CM | POA: Diagnosis not present

## 2022-11-14 DIAGNOSIS — C50911 Malignant neoplasm of unspecified site of right female breast: Secondary | ICD-10-CM | POA: Diagnosis not present

## 2022-11-14 DIAGNOSIS — R519 Headache, unspecified: Secondary | ICD-10-CM | POA: Diagnosis not present

## 2022-11-14 MED ORDER — OXYCODONE-ACETAMINOPHEN 5-325 MG PO TABS
1.0000 | ORAL_TABLET | ORAL | Status: DC | PRN
  Administered 2022-11-14 – 2022-11-17 (×16): 1 via ORAL
  Filled 2022-11-14 (×16): qty 1

## 2022-11-14 MED ORDER — ORAL CARE MOUTH RINSE: 15.0000 mL | OROMUCOSAL | Status: DC | PRN

## 2022-11-14 MED ORDER — GADOBUTROL 1 MMOL/ML IV SOLN
4.0000 mL | Freq: Once | INTRAVENOUS | Status: AC | PRN
  Administered 2022-11-14: 4 mL via INTRAVENOUS

## 2022-11-14 MED ORDER — HYDROXYZINE HCL 25 MG PO TABS
25.0000 mg | ORAL_TABLET | Freq: Three times a day (TID) | ORAL | Status: DC | PRN
  Administered 2022-11-14 – 2022-11-17 (×8): 25 mg via ORAL
  Filled 2022-11-14 (×8): qty 1

## 2022-11-14 NOTE — Progress Notes (Signed)
Patient ID: Sara [name], female   DOB: [birth year], 47 y.o.   MRN: [id number] BP 99/71   Pulse 68   Temp 98.1 F (36.7 C) (Axillary)   Resp (!) 21   Ht 5\' 4"  (1.626 m)   Wt 43.8 kg   LMP 11/08/2022   SpO2 (!) 81%   BMI 16.57 kg/m  Alert and oriented x 4, speech is clear, and fluent Perrl, full eom Tongue and uvula midline No nystagmus. Good fine motor movement Moving all extremities well Dressing blood stained, dry

## 2022-11-14 NOTE — Progress Notes (Signed)
Patient stated to this RN that pain medication was not given at 1740 and is currently in a lot of pain with a score of 10/10.  RN explained to patient that PRN IV pain medication was given at 1750 along with schedule steroid dose.  Patient adamantly stated she did not receive the dose and will keep track of when pain medication is administer from now on.  RN explained to patient that pain medication was given and there is only PRN tylenol left to give at this time for pain.  Patient became increasingly agitated and visibly upset.  RN informed patient that IV haldol will be given to help with agitation.  Patient agree.  MD will be updated.

## 2022-11-14 NOTE — Progress Notes (Signed)
PROGRESS NOTE    Sara [name]  [id number] DOB: 1976/04/17 DOA: 11/10/2022 PCP: Neale Burly, MD   Brief Narrative: Sara [name] is a 47 y.o. female with history of HER2 positive metastatic right breast cancer. Patient presented secondary to worsening severe headache.  CT imaging significant for moderate hydrocephalus with obstruction at the level of cerebral aqueduct secondary to mass effect from right cerebellar/posterior fossa mass in addition to evidence of a second mass along the right aspect of the falx cerebri concerning for meningioma.  MRI confirms a 3.8 cm right cerebellar metastatic mass with vasogenic edema and mass effect causing obstructing hydrocephalus in addition to a 1.3 cm metastatic lesion of the right parietal lobe.  Patient started empirically on Decadron IV.  Neurosurgery consulted and recommended transfer to [facility].  No surgery planned for surgical management this admission secondary to ongoing symptoms.   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 Neurosurgery consulted. Decadron IV started. Neurosurgery performed a successful craniectomy for resection of the cerebellar mass on 3/27, confirmed metastatic adenocarcinoma. -Neurosurgery recommendations: Keppra, Decadron; pending today; Called on-call neurosurgery today who plan on evaluating patient today -Continue dilaudid IV PRN; add Percocet PRN  Metastatic right breast cancer Brain/Liver/Bone metastasis Patient's oncologist is Dr. Delton Coombes. She recently completed 4 cycles of THP chemotherapy. . CT abdomen/pelvis shows no residual hepatic lesions. CT head/MRI brain significant for 3.8 cm right cerebellar metastatic mass with vasogenic edema and mass effect causing obstructing hydrocephalus in addition to a 1.3 cm metastatic lesion of the right  parietal lobe.   Agitation Possible delirium. Some improvement with haldol.  Anxiety -hydroxyzine PRN  Elevated AST/ALT Patient with a history of liver metastasis which appears improved on most recent CT imaging. Unclear etiology for AST/ALT rise at this time; normal bilirubin. Patient is asymptomatic. No ductal dilation noted on imaging. Last CMP from 02/2022 with normal AST/ALT. AST.ALT now trending down.   DVT prophylaxis: SCDs Code Status:   Code Status: Full Code Family Communication: None at bedside Disposition Plan: Discharge pending ongoing neurosurgery recommendations   Consultants:  Neurosurgery Oncology  Procedures:  3/27: Suboccipital craniectomy for resection of right cerebellar mass  Antimicrobials: None    Subjective: Patient with posterior head pain around incision area. Per nursing, patient has been agitated and intermittently with confusion.  Objective: BP 99/71   Pulse 68   Temp 98.1 F (36.7 C) (Axillary)   Resp (!) 21   Ht 5\' 4"  (1.626 m)   Wt 43.8 kg   LMP 11/08/2022   SpO2 (!) 81%   BMI 16.57 kg/m   Examination:  General exam: Appears calm and comfortable Respiratory system: Clear to auscultation. Respiratory effort normal. Cardiovascular system: S1 & S2 heard, RRR. Gastrointestinal system: Abdomen is nondistended, soft and nontender. Normal bowel sounds heard. Central nervous system: Alert and oriented. Musculoskeletal: No edema. No calf tenderness Skin: No cyanosis. No rashes   Data Reviewed: I have personally reviewed following labs and imaging studies  CBC Lab Results  Component Value Date   WBC 9.5 11/12/2022   RBC 3.52 (L) 11/12/2022   HGB 11.6 (L) 11/12/2022   HCT 32.0 (L) 11/12/2022   MCV 90.9 11/12/2022   MCH 33.0 11/12/2022   PLT 242 11/12/2022   MCHC 36.3 (H) 11/12/2022   RDW 12.6 11/12/2022   LYMPHSABS 1.6 02/25/2022   MONOABS 0.8 02/25/2022   EOSABS 0.0 02/25/2022  BASOSABS 0.1 02/25/2022     Last  metabolic panel Lab Results  Component Value Date   NA 133 (L) 11/13/2022   K 4.1 11/13/2022   CL 96 (L) 11/13/2022   CO2 26 11/13/2022   BUN 15 11/13/2022   CREATININE 0.79 11/13/2022   GLUCOSE 102 (H) 11/13/2022   GFRNONAA >60 11/13/2022   GFRAA >60 10/25/2019   CALCIUM 9.4 11/13/2022   PHOS 3.9 11/12/2022   PROT 6.9 11/13/2022   ALBUMIN 3.8 11/13/2022   BILITOT 0.9 11/13/2022   ALKPHOS 55 11/13/2022   AST 125 (H) 11/13/2022   ALT 303 (H) 11/13/2022   ANIONGAP 11 11/13/2022    GFR: Estimated Creatinine Clearance: 60.8 mL/min (by C-G formula based on SCr of 0.79 mg/dL).  Recent Results (from the past 240 hour(s))  MRSA Next Gen by PCR, Nasal     Status: None   Collection Time: 11/12/22  6:16 PM   Specimen: Nasal Mucosa; Nasal Swab  Result Value Ref Range Status   MRSA by PCR Next Gen NOT DETECTED NOT DETECTED Final    Comment: (NOTE) The GeneXpert MRSA Assay (FDA approved for NASAL specimens only), is one component of a comprehensive MRSA colonization surveillance program. It is not intended to diagnose MRSA infection nor to guide or monitor treatment for MRSA infections. Test performance is not FDA approved in patients less than 107 years old. Performed at [facility], [place] [address], [place], [place] [address]       Radiology Studies: No results found.    LOS: 3 days    Cordelia Poche, MD [facility] 11/14/2022, 12:17 PM   If 7PM-7AM, please contact night-coverage [email or web address]

## 2022-11-15 DIAGNOSIS — C50911 Malignant neoplasm of unspecified site of right female breast: Secondary | ICD-10-CM | POA: Diagnosis not present

## 2022-11-15 DIAGNOSIS — R519 Headache, unspecified: Secondary | ICD-10-CM | POA: Diagnosis not present

## 2022-11-15 DIAGNOSIS — G9389 Other specified disorders of brain: Secondary | ICD-10-CM | POA: Diagnosis not present

## 2022-11-15 MED ORDER — DEXAMETHASONE 4 MG PO TABS
4.0000 mg | ORAL_TABLET | Freq: Two times a day (BID) | ORAL | Status: DC
  Administered 2022-11-15 – 2022-11-16 (×2): 4 mg via ORAL
  Filled 2022-11-15 (×2): qty 1

## 2022-11-15 MED ORDER — HEPARIN SODIUM (PORCINE) 5000 UNIT/ML IJ SOLN
5000.0000 [IU] | Freq: Three times a day (TID) | INTRAMUSCULAR | Status: DC
  Administered 2022-11-15 – 2022-11-17 (×6): 5000 [IU] via SUBCUTANEOUS
  Filled 2022-11-15 (×6): qty 1

## 2022-11-15 NOTE — Evaluation (Signed)
Physical Therapy Evaluation Patient Details Name: Sara [name] MRN: [id number] DOB: May 19, 1976 Today's Date: 11/15/2022  History of Present Illness  47 y.o. female who presents to the emergency department 11/11/22 due to 1 month history of persistent and severe headache. 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  Clinical Impression   Pt admitted secondary to problem above with deficits below. PTA patient was living with father, sister, and 16 yo daughter in a mobile home with 3 steps to enter. She reports she was having significant vertigo, some vomiting, and had fallen twice. Pt currently requires min assist for balance when walking without a device. She reports her balance has already improved and she hopes to walk without a device. Anticipate may need cane, but will continue to assess. Recommend f/u PT to address vestibular deficits. Anticipate patient will benefit from PT to address problems listed below.Will continue to follow acutely to maximize functional mobility independence and safety.          Recommendations for follow up therapy are one component of a multi-disciplinary discharge planning process, led by the attending physician.  Recommendations may be updated based on patient status, additional functional criteria and insurance authorization.  Follow Up Recommendations       Assistance Recommended at Discharge Set up Supervision/Assistance  Patient can return home with the following  A little help with walking and/or transfers;Assistance with cooking/housework;Assist for transportation;Help with stairs or ramp for entrance    Equipment Recommendations Other (comment) (TBA cane vs RW)  Recommendations for Other Services       Functional Status Assessment Patient has had a recent decline in their functional status and  demonstrates the ability to make significant improvements in function in a reasonable and predictable amount of time.     Precautions / Restrictions Precautions Precautions: Fall Restrictions Weight Bearing Restrictions: No      Mobility  Bed Mobility Overal bed mobility: Independent             General bed mobility comments: slightly impulsive but moves safely    Transfers Overall transfer level: Needs assistance Equipment used: None Transfers: Sit to/from Stand Sit to Stand: Min guard           General transfer comment: for safety due to h/o falls PTA    Ambulation/Gait Ambulation/Gait assistance: Min assist Gait Distance (Feet): 50 Feet Assistive device: None Gait Pattern/deviations: Step-through pattern, Decreased stride length   Gait velocity interpretation: 1.31 - 2.62 ft/sec, indicative of limited community ambulator   General Gait Details: pt initially reaching for RUE support; reports she is walking much better than prior to surgery; min assist for mild imbalance; remained in room due to light sensitivity and headache already 8/10  Stairs            Wheelchair Mobility    Modified Rankin (Stroke Patients Only)       Balance Overall balance assessment: Needs assistance Sitting-balance support: No upper extremity supported, Feet supported Sitting balance-Leahy Scale: Fair     Standing balance support: No upper extremity supported Standing balance-Leahy Scale: Fair                               Pertinent Vitals/Pain Pain Assessment Pain Assessment: 0-10 Pain Score: 8  Pain Location: back of head Pain Descriptors / Indicators: Headache Pain  Intervention(s): Limited activity within patient's tolerance, Premedicated before session, Repositioned    Home Living Family/patient expects to be discharged to:: Private residence Living Arrangements: Parent;Children (daddy daughter (35 yo) sister) Available Help at Discharge:  Family;Available 24 hours/day Type of Home: Mobile home Home Access: Stairs to enter Entrance Stairs-Rails: Left Entrance Stairs-Number of Steps: 3   Home Layout: One level Home Equipment: None Additional Comments: father is home all the time, sister is there all weekends and after work. 1 dog named Sapphire call her Sassy    Prior Function Prior Level of Function : Independent/Modified Independent (no driving)             Mobility Comments: reports 2 recent falls due to dizziness       Hand Dominance   Dominant Hand: Right    Extremity/Trunk Assessment   Upper Extremity Assessment Upper Extremity Assessment: Defer to OT evaluation    Lower Extremity Assessment Lower Extremity Assessment: Overall WFL for tasks assessed (coordination intact)    Cervical / Trunk Assessment Cervical / Trunk Assessment: Normal  Communication   Communication: No difficulties  Cognition Arousal/Alertness: Awake/alert Behavior During Therapy: Restless Overall Cognitive Status: Impaired/Different from baseline Area of Impairment: Memory                     Memory: Decreased short-term memory         General Comments: pt somewhat impulsive and verbalized that she is anxious. pt apologized several times during session for impulsive movement        General Comments General comments (skin integrity, edema, etc.): Pt reported she had significant vertigo prior to surgery. Now improved but really bad if she rolls over in bed. Educated her on need to continue movements that trigger the vertigo as long as it is safe for her to do so (ex. rolling, turning head when seated or supine)    Exercises     Assessment/Plan    PT Assessment Patient needs continued PT services  PT Problem List Decreased activity tolerance;Decreased balance;Decreased mobility;Decreased knowledge of use of DME;Decreased cognition       PT Treatment Interventions DME instruction;Gait training;Stair  training;Functional mobility training;Therapeutic activities;Balance training;Neuromuscular re-education;Patient/family education    PT Goals (Current goals can be found in the Care Plan section)  Acute Rehab PT Goals Patient Stated Goal: be able to walk without a safety device PT Goal Formulation: With patient Time For Goal Achievement: 11/29/22 Potential to Achieve Goals: Good    Frequency Min 4X/week     Co-evaluation PT/OT/SLP Co-Evaluation/Treatment: Yes Reason for Co-Treatment: For patient/therapist safety;To address functional/ADL transfers;Necessary to address cognition/behavior during functional activity PT goals addressed during session: Mobility/safety with mobility;Balance OT goals addressed during session: ADL's and self-care;Proper use of Adaptive equipment and DME;Strengthening/ROM       AM-PAC PT "6 Clicks" Mobility  Outcome Measure Help needed turning from your back to your side while in a flat bed without using bedrails?: None Help needed moving from lying on your back to sitting on the side of a flat bed without using bedrails?: None Help needed moving to and from a bed to a chair (including a wheelchair)?: A Little Help needed standing up from a chair using your arms (e.g., wheelchair or bedside chair)?: A Little Help needed to walk in hospital room?: A Little Help needed climbing 3-5 steps with a railing? : A Little 6 Click Score: 20    End of Session Equipment Utilized During Treatment: Gait belt Activity Tolerance: Patient  limited by pain Patient left: in chair;with call bell/phone within reach;with chair alarm set Nurse Communication: Mobility status PT Visit Diagnosis: Unsteadiness on feet (R26.81)    Time: 1015-1040 PT Time Calculation (min) (ACUTE ONLY): 25 min   Charges:   PT Evaluation $PT Eval Low Complexity: [place], PT Acute Rehabilitation Services  Office [phone]   Rexanne Mano 11/15/2022, 11:26 AM

## 2022-11-15 NOTE — Progress Notes (Addendum)
Neurosurgery Service Progress Note  Subjective: No acute events overnight, feels better after getting some decent sleep, less anxious / less psychomotor agitation   Objective: Vitals:   11/15/22 0600 11/15/22 0700 11/15/22 0800 11/15/22 0900  BP: (!) 95/55 98/83 103/61 (!) 98/58  Pulse: 83 91 86 80  Resp: 14 18 13 13   Temp:      TempSrc:      SpO2: 97% 97% 96% 96%  Weight:      Height:        Physical Exam: Strength 5/5 x4 and SILTx4, some mild dysmetria, incision c/d/i  Assessment & Plan: 47 y.o. woman s/p SOC for breast met, MRI w/ GTR, recovering well.  -transfer out of unit to floor -dex to 4mg  bid po -keppra may be contributing to agitation / pscyhmotor agitation, no h/o epilepsy, will d/c  -PT/OT eval -SCDs/TEDs/SQH  Judith Part  11/15/22 9:57 AM

## 2022-11-15 NOTE — Progress Notes (Signed)
PROGRESS NOTE    Sara [name]  [id number] DOB: 17-May-1976 DOA: 11/10/2022 PCP: Neale Burly, MD   Brief Narrative: Sara [name] is a 47 y.o. female with history of HER2 positive metastatic right breast cancer. Patient presented secondary to worsening severe headache.  CT imaging significant for moderate hydrocephalus with obstruction at the level of cerebral aqueduct secondary to mass effect from right cerebellar/posterior fossa mass in addition to evidence of a second mass along the right aspect of the falx cerebri concerning for meningioma.  MRI confirms a 3.8 cm right cerebellar metastatic mass with vasogenic edema and mass effect causing obstructing hydrocephalus in addition to a 1.3 cm metastatic lesion of the right parietal lobe.  Patient started empirically on Decadron IV.  Neurosurgery consulted and recommended transfer to [facility].  No surgery planned for surgical management this admission secondary to ongoing symptoms.   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 Neurosurgery consulted. Decadron IV started. Neurosurgery performed a successful craniectomy for resection of the cerebellar mass on 3/27, confirmed metastatic adenocarcinoma. -Neurosurgery recommendations: Discontinue Keppra, continue decadron, plan to discuss patient at tumor boards for further decide further management -Continue dilaudid IV PRN; add Percocet PRN  Metastatic right breast cancer Brain/Liver/Bone metastasis Patient's oncologist is Dr. Delton Coombes. She recently completed 4 cycles of THP chemotherapy. . CT abdomen/pelvis shows no residual hepatic lesions. CT head/MRI brain significant for 3.8 cm right cerebellar metastatic mass with vasogenic edema and mass effect causing obstructing hydrocephalus in addition to a 1.3 cm metastatic lesion of  the right parietal lobe.   Agitation Possible delirium. Some improvement with haldol. Intermittent. -Delirium precautions -Pain management  Anxiety -Hydroxyzine PRN  Elevated AST/ALT Patient with a history of liver metastasis which appears improved on most recent CT imaging. Unclear etiology for AST/ALT rise at this time; normal bilirubin. Patient is asymptomatic. No ductal dilation noted on imaging. Last CMP from 02/2022 with normal AST/ALT. AST.ALT now trending down.   DVT prophylaxis: SCDs, heparin subq Code Status:   Code Status: Full Code Family Communication: None at bedside Disposition Plan: Discharge pending ongoing neurosurgery recommendations   Consultants:  Neurosurgery Oncology  Procedures:  3/27: Suboccipital craniectomy for resection of right cerebellar mass  Antimicrobials: None    Subjective: Still with some posterior head pain. No other concerns.  Objective: BP 109/68   Pulse 99   Temp 98.9 F (37.2 C) (Axillary)   Resp 12   Ht 5\' 4"  (1.626 m)   Wt 43.8 kg   LMP 11/08/2022   SpO2 95%   BMI 16.57 kg/m   Examination:  General exam: Appears calm and comfortable Respiratory system: Clear to auscultation. Respiratory effort normal. Cardiovascular system: S1 & S2 heard, RRR. Gastrointestinal system: Abdomen is nondistended, soft and nontender. Normal bowel sounds heard. Central nervous system: Alert and oriented. Musculoskeletal: No edema. No calf tenderness Skin: No cyanosis. No rashes Psychiatry: Judgement and insight appear normal. Mood & affect appropriate.    Data Reviewed: I have personally reviewed following labs and imaging studies  CBC Lab Results  Component Value Date   WBC 9.5 11/12/2022   RBC 3.52 (L) 11/12/2022   HGB 11.6 (L) 11/12/2022   HCT 32.0 (L) 11/12/2022   MCV 90.9 11/12/2022   MCH 33.0 11/12/2022   PLT 242 11/12/2022   MCHC 36.3 (H) 11/12/2022   RDW 12.6 11/12/2022   LYMPHSABS 1.6 02/25/2022  MONOABS 0.8  02/25/2022   EOSABS 0.0 02/25/2022   BASOSABS 0.1 [id number]     Last metabolic panel Lab Results  Component Value Date   NA 133 (L) 11/13/2022   K 4.1 11/13/2022   CL 96 (L) 11/13/2022   CO2 26 11/13/2022   BUN 15 11/13/2022   CREATININE 0.79 11/13/2022   GLUCOSE 102 (H) 11/13/2022   GFRNONAA >60 11/13/2022   GFRAA >60 10/25/2019   CALCIUM 9.4 11/13/2022   PHOS 3.9 11/12/2022   PROT 6.9 11/13/2022   ALBUMIN 3.8 11/13/2022   BILITOT 0.9 11/13/2022   ALKPHOS 55 11/13/2022   AST 125 (H) 11/13/2022   ALT 303 (H) 11/13/2022   ANIONGAP 11 11/13/2022    GFR: Estimated Creatinine Clearance: 60.8 mL/min (by C-G formula based on SCr of 0.79 mg/dL).  Recent Results (from the past 240 hour(s))  MRSA Next Gen by PCR, Nasal     Status: None   Collection Time: 11/12/22  6:16 PM   Specimen: Nasal Mucosa; Nasal Swab  Result Value Ref Range Status   MRSA by PCR Next Gen NOT DETECTED NOT DETECTED Final    Comment: (NOTE) The GeneXpert MRSA Assay (FDA approved for NASAL specimens only), is one component of a comprehensive MRSA colonization surveillance program. It is not intended to diagnose MRSA infection nor to guide or monitor treatment for MRSA infections. Test performance is not FDA approved in patients less than 75 years old. Performed at [facility], [address] [place],  [address]       Radiology Studies: MR BRAIN W WO CONTRAST  Result Date: 11/14/2022 CLINICAL DATA:  Brain/CNS neoplasm staging. EXAM: MRI HEAD WITHOUT AND WITH CONTRAST TECHNIQUE: Multiplanar, multiecho pulse sequences of the brain and surrounding structures were obtained without and with intravenous contrast. CONTRAST:  81mL GADAVIST GADOBUTROL 1 MMOL/ML IV SOLN COMPARISON:  MRI Brain 10/22/22 FINDINGS: Brain: Postsurgical changes from suboccipital craniotomy and resection of right cerebellar metastatic lesion. There is a T2 hyperintense soft tissue fluid collection adjacent to the craniotomy  site measuring 4.4 x 1.9 cm in the greatest axial dimension, which could represent a postsurgical seroma or a meningocele. There is hyperintense signal surrounding the resection cavity on diffusion-weighted imaging. Some of signal to be centered in the cerebellum (series 5, image 60), while signal along the posterior aspect of the resection cavity appears intra-axial. This likely represents combination of postsurgical change and postsurgical blood products. There is interval decrease in mass effect the fourth ventricle with decreased size of the ventricular system./FLAIR hyperintense signal abnormality around the right cerebellum appears stable slightly increased, some of which is likely postsurgical in nature. No residual contrast enhancement is seen in the right cerebellum to suggest residual disease. Redemonstrated additional metastatic lesion in the parasagittal right parietal lobe (series 11, image 20), unchanged compared recent prior brain dated 11/11/2022. Vascular: Normal flow voids. Skull and upper cervical spine: Postsurgical changes from a suboccipital craniotomy. Sinuses/Orbits: No mastoid or middle ear effusion. Paranasal sinuses are clear. Orbits are unremarkable. Other: None. IMPRESSION: 1. Postsurgical changes from suboccipital craniotomy and resection of right cerebellar metastatic lesion. No residual contrast enhancement is seen in the right cerebellum to suggest residual disease. 2. Interval decrease in mass effect on the fourth ventricle with decreased size of the ventricular system. 3. Soft tissue fluid collection adjacent to the craniotomy site could represent a postsurgical seroma or a meningocele. 4. Redemonstrated additional metastatic lesion in the parasagittal right parietal lobe, unchanged compared to recent prior  brain dated 11/11/2022. Electronically Signed   By: Marin Roberts M.D.   On: 11/14/2022 15:47      LOS: 4 days    Cordelia Poche, MD [facility] 11/15/2022, 1:29  PM   If 7PM-7AM, please contact night-coverage [email or web address]

## 2022-11-15 NOTE — Evaluation (Signed)
Occupational Therapy Evaluation Patient Details Name: Sara [name] MRN: [id number] DOB: 23-Dec-1975 Today's Date: 11/15/2022   History of Present Illness 47 y.o. female who presents to the emergency department 11/11/22 due to 1 month history of persistent and severe headache. 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   Clinical Impression   Patient is s/p R cerebellar / posterior fossa mass craniectomy resection surgery resulting in functional limitations due to the deficits listed below (see OT problem list). Pt was indep with adls prior to admission. Pt lives with father, sister and daughter (31yo) so has adequate support for home. Pt reports light sensitivity and pain with any pressure to the back of the neck.  Patient will benefit from skilled OT acutely to increase independence and safety with ADLS to allow discharge outpatient setting for vision and vestibular follow up.       Recommendations for follow up therapy are one component of a multi-disciplinary discharge planning process, led by the attending physician.  Recommendations may be updated based on patient status, additional functional criteria and insurance authorization.   Assistance Recommended at Discharge  PRN/ setup   Patient can return home with the following Direct supervision/assist for medications management;Assist for transportation    Functional Status Assessment  Patient has had a recent decline in their functional status and demonstrates the ability to make significant improvements in function in a reasonable and predictable amount of time.  Equipment Recommendations  None recommended by OT    Recommendations for Other Services       Precautions / Restrictions Precautions Precautions: Fall Restrictions Weight Bearing Restrictions: No      Mobility Bed  Mobility Overal bed mobility: Modified Independent             General bed mobility comments: exiting on the L side with HOB increased 40 degrees    Transfers Overall transfer level: Needs assistance Equipment used: 1 person hand held assist Transfers: Sit to/from Stand Sit to Stand: Min assist Stand pivot transfers: Min assist         General transfer comment: pt with forward flexion and reaching with arms for chair to transfer impulsive prior to cue to complete task.      Balance Overall balance assessment: Needs assistance Sitting-balance support: No upper extremity supported, Feet supported Sitting balance-Leahy Scale: Fair     Standing balance support: Single extremity supported, During functional activity, Reliant on assistive device for balance Standing balance-Leahy Scale: Fair                             ADL either performed or assessed with clinical judgement   ADL Overall ADL's : Needs assistance/impaired Eating/Feeding: Modified independent   Grooming: Wash/dry face   Upper Body Bathing: Supervision/ safety   Lower Body Bathing: Supervison/ safety   Upper Body Dressing : Supervision/safety   Lower Body Dressing: Supervision/safety   Toilet Transfer: Minimal assistance           Functional mobility during ADLs: Minimal assistance General ADL Comments: pt reports pain in head and increases with oob to chair movement. pt reports any pressure on head is uncomfortable. Pt provided yellow foam with cut out to relieve pressure     Vision Baseline Vision/History: 1 Wears glasses Ability to See in Adequate Light: 1 Impaired Patient Visual Report: Blurring of  vision (suppose to wear them all the time but doesnt wear them properly per patient) Additional Comments: pt able to scan in all quadrants. pt reports blurry vision and no glasses present. pt reports sensitive to light at this time so room with dim light     Perception     Praxis       Pertinent Vitals/Pain Pain Assessment Pain Assessment: 0-10 Pain Score: 8  Pain Location: back of head Pain Descriptors / Indicators: Headache Pain Intervention(s): Limited activity within patient's tolerance, Premedicated before session, Repositioned, Relaxation     Hand Dominance Right   Extremity/Trunk Assessment Upper Extremity Assessment Upper Extremity Assessment: Defer to OT evaluation   Lower Extremity Assessment Lower Extremity Assessment: Overall WFL for tasks assessed (coordination intact)   Cervical / Trunk Assessment Cervical / Trunk Assessment: Normal   Communication Communication Communication: No difficulties   Cognition Arousal/Alertness: Awake/alert Behavior During Therapy: Restless Overall Cognitive Status: Impaired/Different from baseline Area of Impairment: Memory                     Memory: Decreased short-term memory         General Comments: pt somewhat impulsive and verbalized that she is anxious. pt apologized several times during session for impulsive movement.     General Comments  dressing on neck noted to have dark blood drainage on white foam dressing.    Exercises     Shoulder Instructions      Home Living Family/patient expects to be discharged to:: Private residence Living Arrangements: Parent;Children (daddy daughter (27 yo) sister) Available Help at Discharge: Family;Available 24 hours/day Type of Home: Mobile home Home Access: Stairs to enter Entrance Stairs-Number of Steps: 3 Entrance Stairs-Rails: Left Home Layout: One level     Bathroom Shower/Tub: Tub/shower unit (like to take bath)   Bathroom Toilet: Standard Bathroom Accessibility: Yes How Accessible: Accessible via walker;Accessible via wheelchair Home Equipment: None   Additional Comments: father is home all the time, sister is there all weekends and after work. 1 dog named Sapphire call her Sassy      Prior Functioning/Environment Prior  Level of Function : Independent/Modified Independent (no driving)             Mobility Comments: reports 2 recent falls due to dizziness          OT Problem List: Impaired balance (sitting and/or standing);Decreased activity tolerance;Decreased cognition;Decreased safety awareness      OT Treatment/Interventions: Self-care/ADL training;DME and/or AE instruction;Cognitive remediation/compensation;Therapeutic activities;Patient/family education;Balance training;Visual/perceptual remediation/compensation;Modalities    OT Goals(Current goals can be found in the care plan section) Acute Rehab OT Goals Patient Stated Goal: to get something i can put my head on--- provided foam to add to pillow with cut out for neck area OT Goal Formulation: With patient Time For Goal Achievement: 11/29/22 Potential to Achieve Goals: Good  OT Frequency: Min 2X/week    Co-evaluation PT/OT/SLP Co-Evaluation/Treatment: Yes Reason for Co-Treatment: For patient/therapist safety;To address functional/ADL transfers;Necessary to address cognition/behavior during functional activity PT goals addressed during session: Mobility/safety with mobility;Balance OT goals addressed during session: ADL's and self-care;Proper use of Adaptive equipment and DME;Strengthening/ROM      AM-PAC OT "6 Clicks" Daily Activity     Outcome Measure Help from another person eating meals?: None Help from another person taking care of personal grooming?: None Help from another person toileting, which includes using toliet, bedpan, or urinal?: A Little Help from another person bathing (including washing, rinsing, drying)?: A Little Help from  another person to put on and taking off regular upper body clothing?: None Help from another person to put on and taking off regular lower body clothing?: A Little 6 Click Score: 21   End of Session Equipment Utilized During Treatment: Gait belt Nurse Communication: Mobility  status;Precautions  Activity Tolerance: Patient tolerated treatment well Patient left: in chair;with call bell/phone within reach;with chair alarm set  OT Visit Diagnosis: Unsteadiness on feet (R26.81);Muscle weakness (generalized) (M62.81)                Time: [id number] OT Time Calculation (min): 23 min Charges:  OT General Charges $OT Visit: 1 Visit OT Evaluation $OT Eval Moderate Complexity: 1 Mod   Brynn, OTR/L  Acute Rehabilitation Services Office: [phone] .   Jeri Modena 11/15/2022, 11:20 AM

## 2022-11-16 DIAGNOSIS — G9389 Other specified disorders of brain: Secondary | ICD-10-CM | POA: Diagnosis not present

## 2022-11-16 DIAGNOSIS — C50911 Malignant neoplasm of unspecified site of right female breast: Secondary | ICD-10-CM | POA: Diagnosis not present

## 2022-11-16 DIAGNOSIS — R519 Headache, unspecified: Secondary | ICD-10-CM | POA: Diagnosis not present

## 2022-11-16 LAB — COMPREHENSIVE METABOLIC PANEL
ALT: 96 U/L — ABNORMAL HIGH (ref 0–44)
AST: 32 U/L (ref 15–41)
Albumin: 2.7 g/dL — ABNORMAL LOW (ref 3.5–5.0)
Alkaline Phosphatase: 39 U/L (ref 38–126)
Anion gap: 10 (ref 5–15)
BUN: 14 mg/dL (ref 6–20)
CO2: 28 mmol/L (ref 22–32)
Calcium: 6.7 mg/dL — ABNORMAL LOW (ref 8.9–10.3)
Chloride: 93 mmol/L — ABNORMAL LOW (ref 98–111)
Creatinine, Ser: 0.67 mg/dL (ref 0.44–1.00)
GFR, Estimated: >60 mL/min (ref >60)
Glucose, Bld: 83 mg/dL (ref 70–99)
Potassium: 4.9 mmol/L (ref 3.5–5.1)
Sodium: 131 mmol/L — ABNORMAL LOW (ref 135–145)
Total Bilirubin: 0.3 mg/dL (ref 0.3–1.2)
Total Protein: 5.8 g/dL — ABNORMAL LOW (ref 6.5–8.1)

## 2022-11-16 LAB — CBC
HCT: 30.8 % — ABNORMAL LOW (ref 36.0–46.0)
Hemoglobin: 10.2 g/dL — ABNORMAL LOW (ref 12.0–15.0)
MCH: 31.9 pg (ref 26.0–34.0)
MCHC: 33.1 g/dL (ref 30.0–36.0)
MCV: 96.3 fL (ref 80.0–100.0)
Platelets: 230 10*3/uL (ref 150–400)
RBC: 3.2 MIL/uL — ABNORMAL LOW (ref 3.87–5.11)
RDW: 13 % (ref 11.5–15.5)
WBC: 10.1 10*3/uL (ref 4.0–10.5)
nRBC: 0 % (ref 0.0–0.2)

## 2022-11-16 NOTE — Progress Notes (Signed)
PROGRESS NOTE    Sara [name]  [id number] DOB: [birth year] DOA: 11/10/2022 PCP: Neale Burly, MD   Brief Narrative: Sara [name] is a 47 y.o. female with history of HER2 positive metastatic right breast cancer. Patient presented secondary to worsening severe headache.  CT imaging significant for moderate hydrocephalus with obstruction at the level of cerebral aqueduct secondary to mass effect from right cerebellar/posterior fossa mass in addition to evidence of a second mass along the right aspect of the falx cerebri concerning for meningioma.  MRI confirms a 3.8 cm right cerebellar metastatic mass with vasogenic edema and mass effect causing obstructing hydrocephalus in addition to a 1.3 cm metastatic lesion of the right parietal lobe.  Patient started empirically on Decadron IV.  Neurosurgery consulted and recommended transfer to [facility].  Neurosurgery performed a successful craniectomy for resection of the cerebellar mass on 3/27, confirmed metastatic adenocarcinoma; improvement of symptoms.   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 Neurosurgery consulted. Decadron IV started. Neurosurgery performed a successful craniectomy for resection of the cerebellar mass on 3/27, confirmed metastatic adenocarcinoma. -Neurosurgery recommendations: Keppra and decadron discontinued. -Continue Percocet PRN  Metastatic right breast cancer Brain/Liver/Bone metastasis Patient's oncologist is Dr. Delton Coombes. She recently completed 4 cycles of THP chemotherapy. . CT abdomen/pelvis shows no residual hepatic lesions. CT head/MRI brain significant for 3.8 cm right cerebellar metastatic mass with vasogenic edema and mass effect causing obstructing hydrocephalus in addition to a 1.3 cm metastatic lesion of the right parietal lobe.    Agitation Possible delirium. Some improvement with haldol. Intermittent. Mostly resolved. -Delirium precautions -Pain management  Anxiety -Hydroxyzine PRN  Elevated AST/ALT Patient with a history of liver metastasis which appears improved on most recent CT imaging. Unclear etiology for AST/ALT rise at this time; normal bilirubin. Patient is asymptomatic. No ductal dilation noted on imaging. Last CMP from 02/2022 with normal AST/ALT. AST.ALT now trending down.   DVT prophylaxis: SCDs, heparin subq Code Status:   Code Status: Full Code Family Communication: None at bedside Disposition Plan: Discharge home likely in 24 hours.   Consultants:  Neurosurgery Oncology  Procedures:  3/27: Suboccipital craniectomy for resection of right cerebellar mass  Antimicrobials: None    Subjective: No issues this morning. Feels well. Slept well. Worked well with physical therapy.  Objective: BP 105/64 (BP Location: Left Arm)   Pulse 96   Temp 98.8 F (37.1 C) (Oral)   Resp 18   Ht 5\' 4"  (1.626 m)   Wt 43.8 kg   LMP 11/08/2022   SpO2 98%   BMI 16.57 kg/m   Examination:  General exam: Appears calm and comfortable Respiratory system: Clear to auscultation. Respiratory effort normal. Cardiovascular system: S1 & S2 heard, RRR. Gastrointestinal system: Abdomen is nondistended, soft and nontender. Normal bowel sounds heard. Central nervous system: Alert and oriented. No focal neurological deficits. Musculoskeletal: No edema. No calf tenderness Skin: No cyanosis. No rashes Psychiatry: Judgement and insight appear normal. Mood & affect appropriate.    Data Reviewed: I have personally reviewed following labs and imaging studies  CBC Lab Results  Component Value Date   WBC 10.1 11/16/2022   RBC 3.20 (L) 11/16/2022   HGB 10.2 (L) 11/16/2022   HCT 30.8 (L) 11/16/2022   MCV 96.3 11/16/2022   MCH 31.9 11/16/2022   PLT 230 11/16/2022   MCHC 33.1 11/16/2022   RDW 13.0 11/16/2022  LYMPHSABS 1.6 02/25/2022   MONOABS 0.8 02/25/2022   EOSABS 0.0 02/25/2022   BASOSABS 0.1 [id number]     Last metabolic panel Lab Results  Component Value Date   NA 131 (L) 11/16/2022   K 4.9 11/16/2022   CL 93 (L) 11/16/2022   CO2 28 11/16/2022   BUN 14 11/16/2022   CREATININE 0.67 11/16/2022   GLUCOSE 83 11/16/2022   GFRNONAA >60 11/16/2022   GFRAA >60 10/25/2019   CALCIUM 6.7 (L) 11/16/2022   PHOS 3.9 11/12/2022   PROT 5.8 (L) 11/16/2022   ALBUMIN 2.7 (L) 11/16/2022   BILITOT 0.3 11/16/2022   ALKPHOS 39 11/16/2022   AST 32 11/16/2022   ALT 96 (H) 11/16/2022   ANIONGAP 10 11/16/2022    GFR: Estimated Creatinine Clearance: 60.8 mL/min (by C-G formula based on SCr of 0.67 mg/dL).  Recent Results (from the past 240 hour(s))  MRSA Next Gen by PCR, Nasal     Status: None   Collection Time: 11/12/22  6:16 PM   Specimen: Nasal Mucosa; Nasal Swab  Result Value Ref Range Status   MRSA by PCR Next Gen NOT DETECTED NOT DETECTED Final    Comment: (NOTE) The GeneXpert MRSA Assay (FDA approved for NASAL specimens only), is one component of a comprehensive MRSA colonization surveillance program. It is not intended to diagnose MRSA infection nor to guide or monitor treatment for MRSA infections. Test performance is not FDA approved in patients less than 34 years old. Performed at [facility], [place] [address], [place], [place] [address]       Radiology Studies: MR BRAIN W WO CONTRAST  Result Date: 11/14/2022 CLINICAL DATA:  Brain/CNS neoplasm staging. EXAM: MRI HEAD WITHOUT AND WITH CONTRAST TECHNIQUE: Multiplanar, multiecho pulse sequences of the brain and surrounding structures were obtained without and with intravenous contrast. CONTRAST:  99mL GADAVIST GADOBUTROL 1 MMOL/ML IV SOLN COMPARISON:  MRI Brain 10/22/22 FINDINGS: Brain: Postsurgical changes from suboccipital craniotomy and resection of right cerebellar metastatic lesion. There is a T2 hyperintense soft tissue  fluid collection adjacent to the craniotomy site measuring 4.4 x 1.9 cm in the greatest axial dimension, which could represent a postsurgical seroma or a meningocele. There is hyperintense signal surrounding the resection cavity on diffusion-weighted imaging. Some of signal to be centered in the cerebellum (series 5, image 60), while signal along the posterior aspect of the resection cavity appears intra-axial. This likely represents combination of postsurgical change and postsurgical blood products. There is interval decrease in mass effect the fourth ventricle with decreased size of the ventricular system./FLAIR hyperintense signal abnormality around the right cerebellum appears stable slightly increased, some of which is likely postsurgical in nature. No residual contrast enhancement is seen in the right cerebellum to suggest residual disease. Redemonstrated additional metastatic lesion in the parasagittal right parietal lobe (series 11, image 20), unchanged compared recent prior brain dated 11/11/2022. Vascular: Normal flow voids. Skull and upper cervical spine: Postsurgical changes from a suboccipital craniotomy. Sinuses/Orbits: No mastoid or middle ear effusion. Paranasal sinuses are clear. Orbits are unremarkable. Other: None. IMPRESSION: 1. Postsurgical changes from suboccipital craniotomy and resection of right cerebellar metastatic lesion. No residual contrast enhancement is seen in the right cerebellum to suggest residual disease. 2. Interval decrease in mass effect on the fourth ventricle with decreased size of the ventricular system. 3. Soft tissue fluid collection adjacent to the craniotomy site could represent a postsurgical seroma or a meningocele. 4. Redemonstrated additional metastatic lesion in the parasagittal right parietal lobe,  unchanged compared to recent prior brain dated 11/11/2022. Electronically Signed   By: Marin Roberts M.D.   On: 11/14/2022 15:47      LOS: 5 days    Cordelia Poche,  MD [facility] 11/16/2022, 12:14 PM   If 7PM-7AM, please contact night-coverage [email or web address]

## 2022-11-16 NOTE — Progress Notes (Signed)
Physical Therapy Treatment Patient Details Name: Sara [name] MRN: [id number] DOB: [birth year] Today's Date: 11/16/2022   History of Present Illness 47 y.o. female who presents to the emergency department 11/11/22 due to 1 month history of persistent and severe headache. 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    PT Comments    Patient attempted use of cane x 40 ft, however remained unsteady and pt could feel this. Educated in use of RW x 80 ft with pt feeling more secure and with improved gait characteristics. Reinforced that she needs to call for RN if getting up OOB. Encouraged pt/nurse to use RW when walking to bathroom.     Recommendations for follow up therapy are one component of a multi-disciplinary discharge planning process, led by the attending physician.  Recommendations may be updated based on patient status, additional functional criteria and insurance authorization.  Follow Up Recommendations       Assistance Recommended at Discharge Set up Supervision/Assistance  Patient can return home with the following A little help with walking and/or transfers;Assistance with cooking/housework;Assist for transportation;Help with stairs or ramp for entrance   Equipment Recommendations  Rolling walker (2 wheels)    Recommendations for Other Services       Precautions / Restrictions Precautions Precautions: Fall Restrictions Weight Bearing Restrictions: No     Mobility  Bed Mobility Overal bed mobility: Independent             General bed mobility comments: slightly impulsive but moves safely    Transfers Overall transfer level: Needs assistance Equipment used: Rolling walker (2 wheels), Straight cane, None Transfers: Sit to/from Stand Sit to Stand: Min guard           General transfer comment: for  safety due to h/o falls PTA; cues for safe use of each device    Ambulation/Gait Ambulation/Gait assistance: Min guard Gait Distance (Feet): 120 Feet (40 with cane; 80 with RW) Assistive device: None, Straight cane, Rolling walker (2 wheels) Gait Pattern/deviations: Step-through pattern, Decreased stride length, Trunk flexed   Gait velocity interpretation: 1.31 - 2.62 ft/sec, indicative of limited community ambulator   General Gait Details: pt with very short step length and guarded gait when using cane; pt able to walk with incr step length and greater stability with RW   Stairs             Wheelchair Mobility    Modified Rankin (Stroke Patients Only)       Balance Overall balance assessment: Needs assistance Sitting-balance support: No upper extremity supported, Feet supported Sitting balance-Leahy Scale: Fair     Standing balance support: No upper extremity supported Standing balance-Leahy Scale: Fair                              Cognition Arousal/Alertness: Awake/alert Behavior During Therapy: Restless Overall Cognitive Status: Impaired/Different from baseline Area of Impairment: Safety/judgement                         Safety/Judgement: Decreased awareness of safety, Decreased awareness of deficits     General Comments: reports she thought she was safe to get up to bathroom on her own (nursing has found pt climbing out of bed with alarm sounding multiple times today)        Exercises  General Comments General comments (skin integrity, edema, etc.): Assisted to bathroom      Pertinent Vitals/Pain Pain Assessment Pain Assessment: 0-10 Pain Score: 7  Pain Location: back of head Pain Descriptors / Indicators: Headache Pain Intervention(s): Limited activity within patient's tolerance, Monitored during session, Repositioned, Patient requesting pain meds-RN notified, Other (comment) (made a pillow with a "cut out" area over her  incision)    Home Living                          Prior Function            PT Goals (current goals can now be found in the care plan section) Acute Rehab PT Goals Patient Stated Goal: be able to walk without a safety device Time For Goal Achievement: 11/29/22 Potential to Achieve Goals: Good Progress towards PT goals: Progressing toward goals    Frequency    Min 4X/week      PT Plan Current plan remains appropriate    Co-evaluation              AM-PAC PT "6 Clicks" Mobility   Outcome Measure  Help needed turning from your back to your side while in a flat bed without using bedrails?: None Help needed moving from lying on your back to sitting on the side of a flat bed without using bedrails?: None Help needed moving to and from a bed to a chair (including a wheelchair)?: A Little Help needed standing up from a chair using your arms (e.g., wheelchair or bedside chair)?: A Little Help needed to walk in hospital room?: A Little Help needed climbing 3-5 steps with a railing? : A Little 6 Click Score: 20    End of Session Equipment Utilized During Treatment: Gait belt Activity Tolerance: Patient limited by pain Patient left: with call bell/phone within reach;in bed;with bed alarm set;with nursing/sitter in room Nurse Communication: Mobility status;Other (comment) (use RW) PT Visit Diagnosis: Unsteadiness on feet (R26.81)     Time: [id number] PT Time Calculation (min) (ACUTE ONLY): 13 min  Charges:  $Gait Training: 8-22 mins                      Arby Barrette, PT Acute Rehabilitation Services  Office [phone]    Sara [name] 11/16/2022, 3:08 PM

## 2022-11-16 NOTE — Progress Notes (Addendum)
Neurosurgery Service Progress Note  Subjective: No acute events overnight. Continues to feel better today.  No voiced complaints.   Objective: Vitals:   11/15/22 1700 11/15/22 2044 11/15/22 2320 11/16/22 0322  BP: 103/73 (!) 101/56 (!) 106/57 105/64  Pulse: 93 73 73 96  Resp:  14  18  Temp:  98.6 F (37 C) 98.6 F (37 C) 98.8 F (37.1 C)  TempSrc:  Oral Oral Oral  SpO2: 91% 95%  98%  Weight:      Height:        Physical Exam: Strength 5/5 x4 and SILTx4, some mild dysmetria, incision c/d/i   Assessment & Plan: 47 y.o. woman s/p SOC for breast met, MRI w/ GTR, recovering well.   -d/c'd dex  -PT/OT -SCDs/TEDs/SQH -her case will be discussed tomorrow at the weekly tumor board meeting, at that time they will discuss possible outpt radiation treatment  -stable for discharge from a neurosurgical standpoint, follow-up as an outpt in 2 weeks.   Norm Parcel, PA-C 11/16/22 8:15 AM

## 2022-11-17 ENCOUNTER — Encounter: Payer: Self-pay | Admitting: Hematology

## 2022-11-17 ENCOUNTER — Encounter: Payer: Self-pay | Admitting: Genetic Counselor

## 2022-11-17 ENCOUNTER — Telehealth: Payer: Self-pay | Admitting: Radiation Oncology

## 2022-11-17 ENCOUNTER — Other Ambulatory Visit: Payer: Self-pay | Admitting: Radiation Therapy

## 2022-11-17 ENCOUNTER — Other Ambulatory Visit (HOSPITAL_COMMUNITY): Payer: Self-pay

## 2022-11-17 DIAGNOSIS — R519 Headache, unspecified: Secondary | ICD-10-CM | POA: Diagnosis not present

## 2022-11-17 DIAGNOSIS — C7931 Secondary malignant neoplasm of brain: Secondary | ICD-10-CM

## 2022-11-17 MED ORDER — OXYCODONE-ACETAMINOPHEN 5-325 MG PO TABS
1.0000 | ORAL_TABLET | ORAL | 0 refills | Status: AC | PRN
  Filled 2022-11-17: qty 18, 3d supply, fill #0

## 2022-11-17 MED ORDER — HEPARIN SOD (PORK) LOCK FLUSH 100 UNIT/ML IV SOLN
500.0000 [IU] | INTRAVENOUS | Status: AC | PRN
  Administered 2022-11-17: 500 [IU]

## 2022-11-17 MED ORDER — DOCUSATE SODIUM 100 MG PO CAPS: 100.0000 mg | ORAL_CAPSULE | Freq: Two times a day (BID) | ORAL | Status: AC

## 2022-11-17 MED ORDER — ENSURE ENLIVE PO LIQD: 237.0000 mL | Freq: Three times a day (TID) | ORAL | 0 refills | Status: AC

## 2022-11-17 MED ORDER — OXYCODONE-ACETAMINOPHEN 5-325 MG PO TABS: 1.0000 | ORAL_TABLET | ORAL | 0 refills | Status: DC | PRN

## 2022-11-17 NOTE — Discharge Summary (Signed)
Physician Discharge Summary   Patient: Sara [name] MRN: [id number] DOB: [birth year]  Admit date:     11/10/2022  Discharge date: 11/17/22  Discharge Physician: Cordelia Poche, MD   PCP: Neale Burly, MD   Recommendations at discharge:  Follow-up with PCP, oncologist, neurosurgery Repeat CMP  Discharge Diagnoses: Principal Problem:   Severe headache Active Problems:   HER2-positive carcinoma of right breast   Cerebellar mass   Dehydration   Breast cancer metastasized to brain, right   Protein-calorie malnutrition, severe  Resolved Problems:   * No resolved hospital problems. *  Hospital Course: Sara [name] is a 47 y.o. female with history of HER2 positive metastatic right breast cancer. Patient presented secondary to worsening severe headache.  CT imaging significant for moderate hydrocephalus with obstruction at the level of cerebral aqueduct secondary to mass effect from right cerebellar/posterior fossa mass in addition to evidence of a second mass along the right aspect of the falx cerebri concerning for meningioma.  MRI confirms a 3.8 cm right cerebellar metastatic mass with vasogenic edema and mass effect causing obstructing hydrocephalus in addition to a 1.3 cm metastatic lesion of the right parietal lobe.  Patient started empirically on Decadron IV.  Neurosurgery consulted and recommended transfer to [facility].  Neurosurgery performed a successful craniectomy for resection of the cerebellar mass on 3/27, confirmed metastatic adenocarcinoma; improvement of symptoms.  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 Neurosurgery consulted. Decadron IV started. Neurosurgery performed a successful craniectomy for resection of the cerebellar mass on 3/27, confirmed metastatic adenocarcinoma. Neurosurgery discontinued Keppra  and decadron. Recommendation for outpatient follow-up per neurosurgery.   Metastatic right breast cancer Brain/Liver/Bone metastasis Patient's oncologist is Dr. Delton Coombes. She recently completed 4 cycles of THP chemotherapy. . CT abdomen/pelvis shows no residual hepatic lesions. CT head/MRI brain on admission significant for 3.8 cm right cerebellar metastatic mass with vasogenic edema and mass effect causing obstructing hydrocephalus in addition to a 1.3 cm metastatic lesion of the right parietal lobe.    Agitation Possible delirium. Some improvement with haldol. Intermittent. Resolved.   Anxiety Managed with hydroxyzine as needed.   Elevated AST/ALT Patient with a history of liver metastasis which appears improved on most recent CT imaging. Unclear etiology for AST/ALT rise at this time; normal bilirubin. Patient is asymptomatic. No ductal dilation noted on imaging. Last CMP from 02/2022 with normal AST/ALT. AST.ALT now trending down.  Severe malnutrition Protein supplementation recommended on discharge.   Consultants: Neurosurgery Procedures performed:  3/27: Suboccipital craniectomy for resection of right cerebellar mass   Disposition: Home, outpatient PT/OT  Diet recommendation: Regular diet  DISCHARGE MEDICATION: Allergies as of 11/17/2022       Reactions   Hydrocodone Itching, Nausea And Vomiting, Rash, Other (See Comments)   Hallucinations when given vicodin post crani in 2024   Sulfa Antibiotics Nausea And Vomiting   Morphine And Related Rash        Medication List     TAKE these medications    docusate sodium 100 MG capsule Commonly known as: COLACE Take 1 capsule (100 mg total) by mouth 2 (two) times daily.   feeding supplement Liqd Take 237 mLs by mouth 3 (three) times daily between meals.   oxyCODONE-acetaminophen 5-325 MG tablet Commonly known as: PERCOCET/ROXICET Take 1 tablet by mouth every 4 (four) hours as needed for up to 3 days for severe pain.  Durable Medical Equipment  (From admission, onward)           Start     Ordered   11/17/22 1024  For home use only DME Walker rolling  Once       Question Answer Comment  Walker: With 5 Inch Wheels   Patient needs a walker to treat with the following condition Metastasis to brain   Patient needs a walker to treat with the following condition Unsteady gait      11/17/22 1023            Follow-up [place] [facility] Follow up.   Specialty: Rehabilitation Why: referral made for outpt PT/OT- they will contact you for scheduling or you may reach out to them if you have not heard anything within 7 days post discharge. Contact information: [place] [id number] [place] [id number] [place] [facility] Follow up.   Why: (Adapt)- rolling walker arranged- to be delivered to room prior to discharge Contact information: [address] [place] [address] [phone]         Neale Burly, MD. Schedule an appointment as soon as possible for a visit in 1 week(s).   Specialty: Internal Medicine Why: For hospital follow-up Contact information: [place] [phone]         Ashok Pall, MD. Schedule an appointment as soon as possible for a visit in 2 week(s).   Specialty: Neurosurgery Why: For wound re-check Contact information: [address] [place] [address] [place] [address] [phone]         Derek Jack, MD. Schedule an appointment as soon as possible for a visit in 1 week(s).   Specialty: Hematology Contact information: [address] [place] [address] [phone]                Discharge Exam: BP 110/82 (BP Location: Left Arm)   Pulse 99   Temp 98.3 F (36.8 C) (Oral)   Resp 17   Ht 5\' 4"  (1.626 m)   Wt 43.8 kg   LMP 11/08/2022   SpO2 98%   BMI 16.57 kg/m    General exam: Appears calm and comfortable Respiratory system: Respiratory effort normal. Gastrointestinal system: Abdomen is non-distended, Central nervous system: Alert and oriented. Psychiatry: Judgement and insight appear normal. Mood & affect appropriate.   Condition at discharge: stable  The results of significant diagnostics from this hospitalization (including imaging, microbiology, ancillary and laboratory) are listed below for reference.   Imaging Studies: MR BRAIN W WO CONTRAST  Result Date: 11/14/2022 CLINICAL DATA:  Brain/CNS neoplasm staging. EXAM: MRI HEAD WITHOUT AND WITH CONTRAST TECHNIQUE: Multiplanar, multiecho pulse sequences of the brain and surrounding structures were obtained without and with intravenous contrast. CONTRAST:  26mL GADAVIST GADOBUTROL 1 MMOL/ML IV SOLN COMPARISON:  MRI Brain 10/22/22 FINDINGS: Brain: Postsurgical changes from suboccipital craniotomy and resection of right cerebellar metastatic lesion. There is a T2 hyperintense soft tissue fluid collection adjacent to the craniotomy site measuring 4.4 x 1.9 cm in the greatest axial dimension, which could represent a postsurgical seroma or a meningocele. There is hyperintense signal surrounding the resection cavity on diffusion-weighted imaging. Some of signal to be centered in the cerebellum (series 5, image 60), while signal along the posterior aspect of the resection cavity appears intra-axial. This likely represents combination of postsurgical change and postsurgical  blood products. There is interval decrease in mass effect the fourth ventricle with decreased size of the ventricular system./FLAIR hyperintense signal abnormality around the right cerebellum appears stable slightly increased, some of which is likely postsurgical in nature. No residual contrast enhancement is seen in the right cerebellum to suggest residual disease. Redemonstrated additional metastatic lesion in the parasagittal right parietal lobe  (series 11, image 20), unchanged compared recent prior brain dated 11/11/2022. Vascular: Normal flow voids. Skull and upper cervical spine: Postsurgical changes from a suboccipital craniotomy. Sinuses/Orbits: No mastoid or middle ear effusion. Paranasal sinuses are clear. Orbits are unremarkable. Other: None. IMPRESSION: 1. Postsurgical changes from suboccipital craniotomy and resection of right cerebellar metastatic lesion. No residual contrast enhancement is seen in the right cerebellum to suggest residual disease. 2. Interval decrease in mass effect on the fourth ventricle with decreased size of the ventricular system. 3. Soft tissue fluid collection adjacent to the craniotomy site could represent a postsurgical seroma or a meningocele. 4. Redemonstrated additional metastatic lesion in the parasagittal right parietal lobe, unchanged compared to recent prior brain dated 11/11/2022. Electronically Signed   By: Marin Roberts M.D.   On: 11/14/2022 15:47   CT CHEST ABDOMEN PELVIS W CONTRAST  Result Date: 11/12/2022 CLINICAL DATA:  Follow-up metastatic breast cancer. Status post chemotherapy. * Tracking Code: BO * EXAM: CT CHEST, ABDOMEN, AND PELVIS WITH CONTRAST TECHNIQUE: Multidetector CT imaging of the chest, abdomen and pelvis was performed following the standard protocol during bolus administration of intravenous contrast. RADIATION DOSE REDUCTION: This exam was performed according to the departmental dose-optimization program which includes automated exposure control, adjustment of the mA and/or kV according to patient size and/or use of iterative reconstruction technique. CONTRAST:  31mL OMNIPAQUE IOHEXOL 300 MG/ML  SOLN COMPARISON:  PET-CT 11/28/2021 FINDINGS: CT CHEST FINDINGS Cardiovascular: The heart is normal in size. No pericardial effusion. The aorta is normal in caliber. No dissection or atherosclerotic calcifications. The branch vessels are patent. Mediastinum/Nodes: No mediastinal or hilar mass or  lymphadenopathy. Small scattered sub 7 mm lymph nodes are noted. The esophagus is unremarkable. Lungs/Pleura: Mild bronchitic type lung changes appear relatively stable and could reflect chronic bronchitis or changes related to smoking. No worrisome pulmonary lesions or pulmonary nodules to suggest pulmonary metastatic disease. No pulmonary infiltrates, pleural effusions or pleural lesions. Musculoskeletal: Significant improved appearance of the right breast. There are small enhancing nodules noted but no recurrent mass or obvious chest wall invasion. No supraclavicular or axillary adenopathy. The lytic destructive bone lesions demonstrate interval healing changes. CT ABDOMEN PELVIS FINDINGS Hepatobiliary: I do not see any residual hepatic lesions suggesting a good response to treatment. Stable mild intra and extrahepatic biliary dilatation likely related to prior cholecystectomy. Pancreas: No mass, inflammation or ductal dilatation. Spleen: Normal size.  No focal lesions. Adrenals/Urinary Tract: The adrenal glands are normal. No renal lesions. The bladder is unremarkable. Stomach/Bowel: The stomach, duodenum, small bowel and colon are unremarkable. Vascular/Lymphatic: The aorta is normal in caliber. No dissection. The branch vessels are patent. The major venous structures are patent. No mesenteric or retroperitoneal mass or adenopathy. Small scattered lymph nodes are noted. Reproductive: Retroverted uterus. No significant uterine or ovarian abnormalities. Other: No pelvic mass or adenopathy. No free pelvic fluid collections. No inguinal mass or adenopathy. No abdominal wall hernia or subcutaneous lesions. Musculoskeletal: The lytic left femoral neck lesion has healed. Other lytic bone lesions involving the spine F healed. No pathologic fracture or spinal canal compromise. IMPRESSION: 1. Significant improved appearance of the right  breast. No recurrent mass or obvious chest wall invasion. There are small enhancing  nodules. Recommend close observation. 2. No supraclavicular, axillary, mediastinal or hilar adenopathy. No findings for pulmonary metastatic disease. 3. No residual hepatic lesions suggesting a good response to treatment. 4. Interval healing changes of the lytic bone lesions. No pathologic fracture or spinal canal compromise. 5. Chronic bronchitic changes in the lungs but no infiltrates or effusions. 6. Stable mild intra and extrahepatic biliary dilatation likely related to prior cholecystectomy. Electronically Signed   By: Marijo Sanes M.D.   On: 11/12/2022 09:12   MR Brain W and Wo Contrast  Result Date: 11/11/2022 CLINICAL DATA:  Metastatic disease evaluation EXAM: MRI HEAD WITHOUT AND WITH CONTRAST TECHNIQUE: Multiplanar, multiecho pulse sequences of the brain and surrounding structures were obtained without and with intravenous contrast. CONTRAST:  57mL GADAVIST GADOBUTROL 1 MMOL/ML IV SOLN COMPARISON:  Head CT from yesterday FINDINGS: Brain: 3.8 cm parenchymal mass in the right cerebellum with vasogenic edema. Prominent local mass effect with downward descent of the cerebellar tonsils and fourth ventricular obstruction causing of string hydrocephalus with transependymal CSF flow. Additional 1.3 Cm mass in the parasagittal right parietal lobe attributed to second metastasis. No acute infarct or hemorrhage. Vascular: Major flow voids and vascular enhancement are preserved Skull and upper cervical spine: Normal marrow signal. Sinuses/Orbits: Negative Findings are known to clinical service per notes and neurosurgery is aware. Direction communication with hospitalist is also underway. IMPRESSION: 1. 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 There is downward descent of the tonsils and fourth ventricular obstruction causing hydrocephalus. 2. Second 13 mm metastasis in the right parietal lobe. Electronically Signed   By: Jorje Guild M.D.   On:  11/11/2022 08:07   CT Head W or Wo Contrast  Result Date: 11/10/2022 CLINICAL DATA:  Headache with history of metastatic breast cancer EXAM: CT HEAD WITHOUT AND WITH CONTRAST TECHNIQUE: Contiguous axial images were obtained from the base of the skull through the vertex without and with intravenous contrast. RADIATION DOSE REDUCTION: This exam was performed according to the departmental dose-optimization program which includes automated exposure control, adjustment of the mA and/or kV according to patient size and/or use of iterative reconstruction technique. CONTRAST:  31mL OMNIPAQUE IOHEXOL 300 MG/ML  SOLN COMPARISON:  None Available. FINDINGS: Brain: There is a 3.6 x 2.8 cm mass in the right cerebellum with surrounding vasogenic edema that causes mass effect on the cerebral aqueduct and fourth ventricle and causes moderate noncommunicating hydrocephalus of the lateral and third ventricles. There is periventricular hypoattenuation compatible with transependymal interstitial edema. There is a right parafalcine mass measuring 1.2 x 1.0 cm. Vascular: No hyperdense vessel or unexpected calcification. Visible vessels are patent. Skull: Normal. Negative for fracture or focal lesion. Sinuses/Orbits: No acute finding. Other: None. IMPRESSION: 1. 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 Neurosurgery consultation recommended. 2. 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 MRI with and without contrast would provide better characterization of both lesions. Electronically Signed   By: Ulyses Jarred M.D.   On: 11/10/2022 22:46    Microbiology: Results for orders placed or performed during the hospital encounter of 11/10/22  MRSA Next Gen by PCR, Nasal     Status: None   Collection Time: 11/12/22  6:16 PM  Specimen: Nasal Mucosa;  Nasal Swab  Result Value Ref Range Status   MRSA by PCR Next Gen NOT DETECTED NOT DETECTED Final    Comment: (NOTE) The GeneXpert MRSA Assay (FDA approved for NASAL specimens only), is one component of a comprehensive MRSA colonization surveillance program. It is not intended to diagnose MRSA infection nor to guide or monitor treatment for MRSA infections. Test performance is not FDA approved in patients less than 52 years old. Performed at [facility], [place] [address], [place],  [address]     Labs: CBC: Recent Labs  Lab 11/10/22 2005 11/11/22 1621 11/12/22 0359 11/12/22 1816 11/16/22 0751  WBC 11.1* 13.2* 9.3 9.5 10.1  HGB 15.0 14.5 13.0 11.6* 10.2*  HCT 42.7 42.4 36.6 32.0* 30.8*  MCV 91.0 92.0 91.7 90.9 96.3  PLT 295 291 244 [id number]   Basic Metabolic Panel: Recent Labs  Lab 11/11/22 1621 11/12/22 0359 11/12/22 1816 11/13/22 0336 11/16/22 0751  NA 133* 133* 134* 133* 131*  K 3.9 5.1 3.8 4.1 4.9  CL 96* 98 101 96* 93*  CO2 25 27 24 26 28   GLUCOSE 123* 137* 139* 102* 83  BUN 15 12 12 15 14   CREATININE 0.80 0.74 0.72 0.79 0.67  CALCIUM 9.6 9.6 9.2 9.4 6.7*  MG 2.1 2.1  --   --   --   PHOS  --  3.9  --   --   --    Liver Function Tests: Recent Labs  Lab 11/12/22 0359 11/13/22 0335 11/16/22 0751  AST 595* 125* 32  ALT 585* 303* 96*  ALKPHOS 66 55 39  BILITOT 1.1 0.9 0.3  PROT 7.1 6.9 5.8*  ALBUMIN 3.8 3.8 2.7*    Discharge time spent: 35 minutes.  Signed: Cordelia Poche, MD [facility] 11/17/2022

## 2022-11-17 NOTE — Progress Notes (Signed)
Physical Therapy Treatment Patient Details Name: Sara [name] MRN: [id number] DOB: Dec 01, 1975 Today's Date: 11/17/2022   History of Present Illness 47 y.o. female who presents to the emergency department 11/11/22 due to 1 month history of persistent and severe headache. 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    PT Comments    Patient doing better. Continue to recommend use of RW on discharge due to h/o falls PTA and remains slightly unsteady and has a dog at home that she says sometimes trips her. Continue to feel OPPT would be beneficial for balance, gait, and possibly vestibular rehab. OK for discharge from PT perspective.     Recommendations for follow up therapy are one component of a multi-disciplinary discharge planning process, led by the attending physician.  Recommendations may be updated based on patient status, additional functional criteria and insurance authorization.  Follow Up Recommendations       Assistance Recommended at Discharge Set up Supervision/Assistance  Patient can return home with the following A little help with walking and/or transfers;Assistance with cooking/housework;Assist for transportation;Help with stairs or ramp for entrance   Equipment Recommendations  Rolling walker (2 wheels)    Recommendations for Other Services       Precautions / Restrictions Precautions Precautions: Fall Restrictions Weight Bearing Restrictions: No     Mobility  Bed Mobility Overal bed mobility: Independent             General bed mobility comments: slightly impulsive but moves safely    Transfers Overall transfer level: Needs assistance Equipment used: Rolling walker (2 wheels), Straight cane, None Transfers: Sit to/from Stand Sit to Stand: Supervision           General transfer comment: for  safety due to h/o falls PTA; cues for safe use of each device    Ambulation/Gait Ambulation/Gait assistance: Supervision Gait Distance (Feet): 200 Feet Assistive device: None, Straight cane, Rolling walker (2 wheels) Gait Pattern/deviations: Step-through pattern, Trunk flexed       General Gait Details: initially short step length but improved with cues; vc for keeping head upright (tending to look down with neck flexed)   Stairs Stairs: Yes Stairs assistance: Supervision, Min guard Stair Management: One rail Left, Forwards Number of Stairs: 10 General stair comments: pt with no imbalance or difficulty with steps; only needed to do 3 however wanted to do more for the activity   Wheelchair Mobility    Modified Rankin (Stroke Patients Only)       Balance Overall balance assessment: Needs assistance Sitting-balance support: No upper extremity supported, Feet supported Sitting balance-Leahy Scale: Fair     Standing balance support: No upper extremity supported Standing balance-Leahy Scale: Fair                              Cognition Arousal/Alertness: Awake/alert Behavior During Therapy: Restless Overall Cognitive Status: Impaired/Different from baseline Area of Impairment: Safety/judgement                         Safety/Judgement: Decreased awareness of safety, Decreased awareness of deficits     General Comments: apparently continues to try to get up alone        Exercises      General Comments General comments (skin integrity, edema, etc.): Up to bathroom to brush  her teeth      Pertinent Vitals/Pain Pain Assessment Pain Assessment: 0-10 Pain Score: 10-Worst pain ever Pain Location: back of head Pain Descriptors / Indicators: Headache Pain Intervention(s): Monitored during session, Limited activity within patient's tolerance, Premedicated before session    Home Living                          Prior Function             PT Goals (current goals can now be found in the care plan section) Acute Rehab PT Goals Patient Stated Goal: be able to walk without a safety device Time For Goal Achievement: 11/29/22 Potential to Achieve Goals: Good Progress towards PT goals: Progressing toward goals    Frequency    Min 4X/week      PT Plan Current plan remains appropriate    Co-evaluation              AM-PAC PT "6 Clicks" Mobility   Outcome Measure  Help needed turning from your back to your side while in a flat bed without using bedrails?: None Help needed moving from lying on your back to sitting on the side of a flat bed without using bedrails?: None Help needed moving to and from a bed to a chair (including a wheelchair)?: A Little Help needed standing up from a chair using your arms (e.g., wheelchair or bedside chair)?: A Little Help needed to walk in hospital room?: A Little Help needed climbing 3-5 steps with a railing? : A Little 6 Click Score: 20    End of Session Equipment Utilized During Treatment: Gait belt Activity Tolerance: Patient tolerated treatment well Patient left: with call bell/phone within reach;in bed;with bed alarm set Nurse Communication: Mobility status;Other (comment) (OK for dc from PT perspective) PT Visit Diagnosis: Unsteadiness on feet (R26.81)     Time: [id number] PT Time Calculation (min) (ACUTE ONLY): 15 min  Charges:  $Gait Training: 8-22 mins                      [place]  Office [phone]    Rexanne Mano 11/17/2022, 11:00 AM

## 2022-11-17 NOTE — TOC Transition Note (Signed)
Transition of Care (TOC) - CM/SW Discharge Note Marvetta Gibbons RN,BSN Transitions of Care Unit 4NP (Non Trauma)- RN Case Manager See Treatment Team for direct Phone #   Patient Details  Name: Sara [name] MRN: [id number] Date of Birth: May 13, 1976  Transition of Care [facility]) CM/SW Contact:  Dawayne Patricia, RN Phone Number: 11/17/2022, 12:32 PM   Clinical Narrative:    Pt stable for transition home per MD. Per recs pt will have outpt PT/OT referral, DME order placed for RW.   CM spoke with pt at bedside- pt does not have preference for DME agency and agreeable to use in house provider to deliver to room. Discussed outpt therapy- and pt voiced she would like [place] location- agreeable to [facility] outpt location. CM will make referral via epic to [facility] out[facility] therapy.   Family to transport home.   Call made to [employer] liaison for DME referral- RW to be delivered to room prior to discharge.   Outpt PT/OT referral sent via epic to [facility] out[facility] rehab. Info placed on AVS.   No further TOC needs noted   Final next level of care: OP Rehab Barriers to Discharge: Barriers Resolved   Patient Goals and CMS Choice CMS Medicare.gov Compare Post Acute Care list provided to:: Patient Choice offered to / list presented to : Patient  Discharge Placement                 Home        Discharge Plan and Services Additional resources added to the After Visit Summary for   In-house Referral: NA Discharge Planning Services: CM Consult Post Acute Care Choice: Durable Medical Equipment          DME Arranged: Gilford Rile rolling DME Agency: [employer] Date DME Agency Contacted: 11/17/22 Time DME Agency Contacted: 1100 Representative spoke with at DME Agency: Erasmo Downer HH Arranged: NA [place] Agency: NA        Social Determinants of Health ([place]) Interventions [place]: No Food Insecurity (11/11/2022)  Housing: Low Risk  (11/11/2022)  Transportation Needs: Unmet  Transportation Needs (11/11/2022)  Utilities: Not At Risk (11/11/2022)  Alcohol Screen: Low Risk  (06/17/2021)  Depression (PHQ2-9): High Risk (06/17/2021)  Financial Resource Strain: High Risk (12/03/2021)  Physical Activity: Sufficiently Active (06/17/2021)  Social Connections: Socially Isolated (06/17/2021)  Stress: Stress Concern Present (06/17/2021)  Tobacco Use: Medium Risk (11/17/2022)     Readmission Risk Interventions    11/17/2022   12:32 PM  Readmission Risk Prevention Plan  Transportation Screening Complete  PCP or Specialist Appt within 5-7 Days Complete  Home Care Screening Complete  Medication Review (RN CM) Complete

## 2022-11-17 NOTE — Progress Notes (Signed)
No answer and mailbox is full. Patient has spoken with her sister that will pick her pt up at 1730.

## 2022-11-17 NOTE — Telephone Encounter (Signed)
4/1 @ 1:26 pm Left voicemail for patient to call our office to be schedule for consult with Dr. Tammi Klippel.

## 2022-11-17 NOTE — Progress Notes (Signed)
No answer and mailbox is full

## 2022-11-18 ENCOUNTER — Telehealth: Payer: Self-pay | Admitting: Radiation Oncology

## 2022-11-18 ENCOUNTER — Encounter: Payer: Self-pay | Admitting: *Deleted

## 2022-11-18 NOTE — Telephone Encounter (Signed)
4/2 @ 8:30 am Left voicemail for patient to call our office to be schedule for consult, also Left message with her sister Kennyth Lose) to call our office.

## 2022-11-18 NOTE — Progress Notes (Signed)
Moeshia Wilfong was contacted by telephone to verify understanding of discharge instructions status post their most recent discharge from the hospital on the date:  11/17/22.  Inpatient discharge AVS was re-reviewed with patient, along with cancer center appointments.  Verification of understanding for oncology specific follow-up was validated using the Teach Back method.    Transportation to appointments were confirmed for the patient as being self/caregiver.  Shylar Barsamian's questions were addressed to their satisfaction upon completion of this post discharge follow-up call for outpatient oncology.

## 2022-11-19 ENCOUNTER — Telehealth: Payer: Self-pay | Admitting: Radiation Oncology

## 2022-11-19 NOTE — Telephone Encounter (Signed)
4/3 @ 8:19 am Left voicemail for patient to call our office to be schedule for consult.  Also called [phone] not in service at this time.

## 2022-11-20 ENCOUNTER — Other Ambulatory Visit: Payer: Self-pay

## 2022-11-20 ENCOUNTER — Telehealth: Payer: Self-pay | Admitting: Radiation Therapy

## 2022-11-20 NOTE — Telephone Encounter (Signed)
I have been unsuccessful in contacting the patient by any of the numbers listed in her chart. We have left multiple messages requesting a call back.   Mont Dutton R.T.(R)(T) Radiation Special Procedures Navigator

## 2022-11-21 ENCOUNTER — Other Ambulatory Visit: Payer: Self-pay | Admitting: Radiation Therapy

## 2022-11-21 ENCOUNTER — Telehealth: Payer: Self-pay | Admitting: Radiation Therapy

## 2022-11-21 DIAGNOSIS — C7931 Secondary malignant neoplasm of brain: Secondary | ICD-10-CM

## 2022-11-21 NOTE — Telephone Encounter (Signed)
Sara [name] returned our call and is scheduled for a telephone consult with Dr. Kathrynn Running. I will work on her radiation treatment planning appointments so they are available to share with her next week.  Sara [name] R.T.(R)(T) Radiation Special Procedures Navigator
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
## 2022-11-25 ENCOUNTER — Ambulatory Visit
Admission: RE | Admit: 2022-11-25 | Discharge: 2022-11-25 | Disposition: A | Discharge status: Home / Self Care | Payer: Medicaid Other | Source: Ambulatory Visit | Attending: Radiation Oncology | Admitting: Radiation Oncology

## 2022-11-25 ENCOUNTER — Encounter: Payer: Self-pay | Admitting: Radiation Oncology

## 2022-11-25 ENCOUNTER — Other Ambulatory Visit: Payer: Self-pay

## 2022-11-25 VITALS — Ht 64.0 in | Wt 98.0 lb

## 2022-11-25 DIAGNOSIS — C7931 Secondary malignant neoplasm of brain: Secondary | ICD-10-CM

## 2022-11-25 DIAGNOSIS — C50911 Malignant neoplasm of unspecified site of right female breast: Secondary | ICD-10-CM

## 2022-11-25 MED ORDER — DEXAMETHASONE 4 MG PO TABS: 4.0000 mg | ORAL_TABLET | Freq: Two times a day (BID) | ORAL | 0 refills | Status: DC

## 2022-11-25 MED ORDER — OXYCODONE-ACETAMINOPHEN 5-325 MG PO TABS: 1.0000 | ORAL_TABLET | ORAL | 0 refills | Status: DC | PRN

## 2022-11-25 NOTE — Progress Notes (Signed)
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 radiological studies, telephone conversation with the patient, entering orders and completing documentation.    Marguarite Arbour, PA-C    Margaretmary Dys, MD  [facility]  Radiation Oncology Direct Dial: [phone]  Fax: [phone] [email or web address]  Skype  LinkedIn   This document serves as a record of services personally performed by Margaretmary Dys, MD and Marcello Fennel, PA-C. It was created on their behalf by Mickie Bail, a trained medical scribe. The creation of this record is based on the scribe's personal observations and the provider's statements to them. This document has been checked and approved by the attending provider.

## 2022-11-26 ENCOUNTER — Other Ambulatory Visit: Payer: Self-pay

## 2022-12-01 ENCOUNTER — Ambulatory Visit (HOSPITAL_COMMUNITY): Payer: Medicaid Other

## 2022-12-02 ENCOUNTER — Ambulatory Visit
Admission: RE | Admit: 2022-12-02 | Discharge: 2022-12-02 | Disposition: A | Discharge status: Home / Self Care | Payer: Medicaid Other | Source: Ambulatory Visit | Attending: Radiation Oncology | Admitting: Radiation Oncology

## 2022-12-02 ENCOUNTER — Ambulatory Visit (HOSPITAL_COMMUNITY): Admission: RE | Admit: 2022-12-02 | Payer: Medicaid Other | Source: Ambulatory Visit

## 2022-12-02 ENCOUNTER — Other Ambulatory Visit: Payer: Self-pay | Admitting: Radiation Therapy

## 2022-12-02 ENCOUNTER — Inpatient Hospital Stay: Payer: Medicaid Other | Attending: Hematology | Admitting: Licensed Clinical Social Worker

## 2022-12-02 ENCOUNTER — Other Ambulatory Visit: Payer: Self-pay | Admitting: Radiation Oncology

## 2022-12-02 VITALS — BP 122/93 | HR 80 | Temp 98.0°F | Resp 18

## 2022-12-02 DIAGNOSIS — Z170 Estrogen receptor positive status [ER+]: Secondary | ICD-10-CM | POA: Diagnosis not present

## 2022-12-02 DIAGNOSIS — C7931 Secondary malignant neoplasm of brain: Secondary | ICD-10-CM

## 2022-12-02 DIAGNOSIS — C50911 Malignant neoplasm of unspecified site of right female breast: Secondary | ICD-10-CM

## 2022-12-02 DIAGNOSIS — Z510 Encounter for antineoplastic radiation therapy: Secondary | ICD-10-CM | POA: Diagnosis present

## 2022-12-02 DIAGNOSIS — C50811 Malignant neoplasm of overlapping sites of right female breast: Secondary | ICD-10-CM | POA: Diagnosis not present

## 2022-12-02 MED ORDER — SODIUM CHLORIDE 0.9% FLUSH
10.0000 mL | INTRAVENOUS | Status: AC | PRN
  Administered 2022-12-02: 10 mL

## 2022-12-02 MED ORDER — PANTOPRAZOLE SODIUM 20 MG PO TBEC: 20.0000 mg | DELAYED_RELEASE_TABLET | Freq: Every day | ORAL | 5 refills | Status: DC

## 2022-12-02 MED ORDER — HEPARIN SOD (PORK) LOCK FLUSH 100 UNIT/ML IV SOLN
500.0000 [IU] | INTRAVENOUS | Status: AC | PRN
  Administered 2022-12-02: 500 [IU]

## 2022-12-02 NOTE — Progress Notes (Signed)
[facility] CSW Progress Note  Visual merchandiser met with patient to assess needs.  Patient was referred by [name], RN.  Provided patient with contact information and support group information.  Patient stated she would call CSW in the future.    Pascal Lux Cortney Mckinney, LCSW    Patient is participating in a Managed Medicaid Plan:  Yes

## 2022-12-02 NOTE — Progress Notes (Signed)
  Radiation Oncology         (336) [phone] ________________________________  Name: DEVONTA CARRARO MRN: [id number]  Date: 12/02/2022  DOB: 1975-08-27  SIMULATION AND TREATMENT PLANNING NOTE    ICD-10-CM   1. Breast cancer metastasized to brain, right  C50.911    C79.31         DIAGNOSIS: 47 yo woman with brain metastases secondary to metastatic breast cancer.  NARRATIVE:  The patient was brought to the CT Simulation planning suite.  Identity was confirmed.  All relevant records and images related to the planned course of therapy were reviewed.  The patient freely provided informed written consent to proceed with treatment after reviewing the details related to the planned course of therapy. The consent form was witnessed and verified by the simulation staff. Intravenous access was established for contrast administration. Then, the patient was set-up in a stable reproducible supine position for radiation therapy.  A relocatable thermoplastic stereotactic head frame was fabricated for precise immobilization.  CT images were obtained.  Surface markings were placed.  The CT images were loaded into the planning software and fused with the patient's targeting MRI scan.  Then the target and avoidance structures were contoured.  Treatment planning then occurred.  The radiation prescription was entered and confirmed.  I have requested 3D planning  I have requested a DVH of the following structures: Brain stem, brain, left eye, right eye, lenses, optic chiasm, target volumes, uninvolved brain, and normal tissue.    SPECIAL TREATMENT PROCEDURE:  The planned course of therapy using radiation constitutes a special treatment procedure. Special care is required in the management of this patient for the following reasons. This treatment constitutes a Special Treatment Procedure for the following reason: High dose per fraction requiring special monitoring for increased toxicities of treatment including daily  imaging.  The special nature of the planned course of radiotherapy will require increased physician supervision and oversight to ensure patient's safety with optimal treatment outcomes.  This requires extended time and effort.  PLAN:  The patient will receive 27 Gy in 3 fractions to her two intact metastases and 30 Gy in 10 fractions to the resection cavity in the posterior fossa.  ________________________________  Artist Pais Kathrynn Running, M.D.

## 2022-12-02 NOTE — Progress Notes (Signed)
As instructed by Dr. Margaretmary Dys, MD.  Successful suture removal (occipital). Clean, dry, intact. No signs of infection. Patient tolerated well.   Sara Favors, LPN

## 2022-12-03 ENCOUNTER — Ambulatory Visit (HOSPITAL_COMMUNITY)
Admission: RE | Admit: 2022-12-03 | Discharge: 2022-12-03 | Disposition: A | Discharge status: Home / Self Care | Payer: Medicaid Other | Source: Ambulatory Visit | Attending: Radiation Oncology | Admitting: Radiation Oncology

## 2022-12-03 DIAGNOSIS — C7931 Secondary malignant neoplasm of brain: Secondary | ICD-10-CM | POA: Insufficient documentation

## 2022-12-03 MED ORDER — GADOBUTROL 1 MMOL/ML IV SOLN
5.0000 mL | Freq: Once | INTRAVENOUS | Status: AC | PRN
  Administered 2022-12-03: 5 mL via INTRAVENOUS

## 2022-12-03 MED ORDER — CHLORHEXIDINE GLUCONATE CLOTH 2 % EX PADS: 6.0000 | MEDICATED_PAD | Freq: Every day | CUTANEOUS | Status: DC

## 2022-12-03 MED ORDER — SODIUM CHLORIDE 0.9% FLUSH: 10.0000 mL | Freq: Two times a day (BID) | INTRAVENOUS | Status: DC

## 2022-12-03 MED ORDER — SODIUM CHLORIDE 0.9% FLUSH: 10.0000 mL | INTRAVENOUS | Status: DC | PRN

## 2022-12-03 MED ORDER — HEPARIN SOD (PORK) LOCK FLUSH 100 UNIT/ML IV SOLN
500.0000 [IU] | INTRAVENOUS | Status: AC | PRN
  Administered 2022-12-03: 500 [IU]
  Filled 2022-12-03: qty 5

## 2022-12-04 ENCOUNTER — Telehealth: Payer: Self-pay | Admitting: Radiation Therapy

## 2022-12-04 ENCOUNTER — Ambulatory Visit: Payer: Medicaid Other | Admitting: Radiation Oncology

## 2022-12-04 ENCOUNTER — Other Ambulatory Visit: Payer: Self-pay | Admitting: Urology

## 2022-12-04 DIAGNOSIS — Z510 Encounter for antineoplastic radiation therapy: Secondary | ICD-10-CM | POA: Diagnosis not present

## 2022-12-04 MED ORDER — OXYCODONE-ACETAMINOPHEN 5-325 MG PO TABS: 1.0000 | ORAL_TABLET | ORAL | 0 refills | Status: DC | PRN

## 2022-12-04 NOTE — Addendum Note (Signed)
Encounter addended by: Margaretmary Dys, MD on: 12/04/2022 10:48 AM  Actions taken: Problem List reviewed, Medication List reviewed, Allergies reviewed, Clinical Note Signed

## 2022-12-04 NOTE — Telephone Encounter (Signed)
Called Sara [name] to inform her of the change in her radiation treatment schedule. We discussed the results of her recent brain MRI. I let her know that an additional small lesion was seen on her planning scan and that we will be able to treat that at the same time as her other treatment tomorrow. We discussed the change in her SRS treatment fractions from three to one and that we will now be treating her post operative site with ten fractions to include the posterior fossa.   I have updated her schedule and Ephriam Knuckles is setting up the additional transportation visits. I have emailed Nadeen her updated treatment schedule. She was thankful for the call and updated information.   [name] R.T.(R)(T) Radiation Special Procedures Navigator

## 2022-12-05 ENCOUNTER — Ambulatory Visit
Admission: RE | Admit: 2022-12-05 | Discharge: 2022-12-05 | Disposition: A | Discharge status: Home / Self Care | Payer: Medicaid Other | Source: Ambulatory Visit | Attending: Radiation Oncology | Admitting: Radiation Oncology

## 2022-12-05 ENCOUNTER — Inpatient Hospital Stay: Payer: Medicaid Other

## 2022-12-05 ENCOUNTER — Other Ambulatory Visit: Payer: Self-pay

## 2022-12-05 ENCOUNTER — Ambulatory Visit: Admission: RE | Admit: 2022-12-05 | Payer: Medicaid Other | Source: Ambulatory Visit | Admitting: Radiation Oncology

## 2022-12-05 ENCOUNTER — Ambulatory Visit: Payer: Medicaid Other

## 2022-12-05 DIAGNOSIS — Z510 Encounter for antineoplastic radiation therapy: Secondary | ICD-10-CM | POA: Diagnosis not present

## 2022-12-05 DIAGNOSIS — C50911 Malignant neoplasm of unspecified site of right female breast: Secondary | ICD-10-CM

## 2022-12-05 LAB — RAD ONC ARIA SESSION SUMMARY
Course Elapsed Days: 0
Plan Fractions Treated to Date: 1
Plan Fractions Treated to Date: 1
Plan Prescribed Dose Per Fraction: 20 Gy
Plan Prescribed Dose Per Fraction: 3 Gy
Plan Total Fractions Prescribed: 1
Plan Total Fractions Prescribed: 10
Plan Total Prescribed Dose: 20 Gy
Plan Total Prescribed Dose: 30 Gy
Reference Point Dosage Given to Date: 20 Gy
Reference Point Dosage Given to Date: 3 Gy
Reference Point Session Dosage Given: 20 Gy
Reference Point Session Dosage Given: 3 Gy
Session Number: 1

## 2022-12-05 NOTE — Progress Notes (Signed)
Patient to nursing for 30 minute observation SRS brain.  Reports headache at night mostly, nausea no vomiting, and difficulty with right arm/hand coordination.  Denies skin irritation, vision ringing, and speech is clear.  Ambulating independently but, sometimes uses walker at home.  Takes Decadron 4 mg po twice a day abut reports has been cutting them into quarters and taking some but not the full strength as prescribed due to medication causing nausea.  Patient has medication for nausea and does take when needed.  Advised to take medication as prescribed it helps with swelling and can alleviate headaches.    Patient is getting PT for her legs and stated she will let them know she also needs for right arm/hand too.  Patient advised to not do anything strenuous for the next 24 hours and to call [phone] if symptoms should worsens.  Vitals:  98.2-68-18-130/81 O2Sat 100% room air.  Patient ambulated out of clinic without difficulty and no assistance.

## 2022-12-07 NOTE — Progress Notes (Signed)
  Radiation Oncology         ([phone]) [phone] ________________________________  Stereotactic Treatment Procedure Note  Name: Sara [name] MRN: [id number]  Date: 12/05/2022  DOB: 1976-01-05  SPECIAL TREATMENT PROCEDURE    ICD-10-CM   1. Breast cancer metastasized to brain, right  C50.911    C79.31       3D TREATMENT PLANNING AND DOSIMETRY:  The patient's radiation plan was reviewed and approved by neurosurgery and radiation oncology prior to treatment.  It showed 3-dimensional radiation distributions overlaid onto the planning CT/MRI image set.  [facility] for the target structures as well as the organs at risk were reviewed. The documentation of the 3D plan and dosimetry are filed in the radiation oncology EMR.  NARRATIVE:  Sara [name] was brought to the TrueBeam stereotactic radiation treatment machine and placed supine on the CT couch. The head frame was applied, and the patient was set up for stereotactic radiosurgery.  Neurosurgery was present for the set-up and delivery  SIMULATION VERIFICATION:  In the couch zero-angle position, the patient underwent Exactrac imaging using the Brainlab system with orthogonal KV images.  These were carefully aligned and repeated to confirm treatment position for each of the isocenters.  The Exactrac snap film verification was repeated at each couch angle.  PROCEDURE: Sara [name] received stereotactic radiosurgery to the following targets: Right Parietal 13 mm target and Right Caudate 2 mm target were treated using 4 Rapid Arc VMAT Beams to a prescription dose of 20 Gy.  ExacTrac registration was performed for each couch angle.  The 100% isodose line was prescribed.  6 MV X-rays were delivered in the flattening filter free beam mode. The post-op cavity in the right cerebellar surgical bed was started today on 30 Gy in 10 fractions  STEREOTACTIC TREATMENT MANAGEMENT:  Following delivery, the patient was transported to nursing in stable condition and  monitored for possible acute effects.  Vital signs were recorded LMP 11/08/2022 . The patient tolerated treatment without significant acute effects, and was discharged to home in stable condition.    PLAN: Finish cerebellar radiotherapy and then follow-up in one month.  ________________________________  Artist Pais. Kathrynn Running, M.D.

## 2022-12-08 ENCOUNTER — Ambulatory Visit
Admission: RE | Admit: 2022-12-08 | Discharge: 2022-12-08 | Disposition: A | Discharge status: Home / Self Care | Payer: Medicaid Other | Source: Ambulatory Visit | Attending: Radiation Oncology | Admitting: Radiation Oncology

## 2022-12-08 ENCOUNTER — Other Ambulatory Visit: Payer: Self-pay

## 2022-12-08 DIAGNOSIS — C50911 Malignant neoplasm of unspecified site of right female breast: Secondary | ICD-10-CM

## 2022-12-08 DIAGNOSIS — Z510 Encounter for antineoplastic radiation therapy: Secondary | ICD-10-CM | POA: Diagnosis not present

## 2022-12-08 LAB — RAD ONC ARIA SESSION SUMMARY
Course Elapsed Days: 3
Plan Fractions Treated to Date: 2
Plan Prescribed Dose Per Fraction: 3 Gy
Plan Total Fractions Prescribed: 10
Plan Total Prescribed Dose: 30 Gy
Reference Point Dosage Given to Date: 6 Gy
Reference Point Session Dosage Given: 3 Gy
Session Number: 2

## 2022-12-09 ENCOUNTER — Other Ambulatory Visit: Payer: Self-pay | Admitting: Hematology

## 2022-12-09 ENCOUNTER — Other Ambulatory Visit: Payer: Self-pay | Admitting: Urology

## 2022-12-09 ENCOUNTER — Inpatient Hospital Stay: Payer: Medicaid Other | Admitting: Hematology

## 2022-12-09 ENCOUNTER — Telehealth: Payer: Self-pay

## 2022-12-09 ENCOUNTER — Inpatient Hospital Stay: Payer: Medicaid Other

## 2022-12-09 ENCOUNTER — Other Ambulatory Visit: Payer: Self-pay

## 2022-12-09 ENCOUNTER — Ambulatory Visit
Admission: RE | Admit: 2022-12-09 | Discharge: 2022-12-09 | Disposition: A | Discharge status: Home / Self Care | Payer: Medicaid Other | Source: Ambulatory Visit | Attending: Radiation Oncology | Admitting: Radiation Oncology

## 2022-12-09 DIAGNOSIS — Z510 Encounter for antineoplastic radiation therapy: Secondary | ICD-10-CM | POA: Diagnosis not present

## 2022-12-09 LAB — RAD ONC ARIA SESSION SUMMARY
Course Elapsed Days: 4
Plan Fractions Treated to Date: 3
Plan Prescribed Dose Per Fraction: 3 Gy
Plan Total Fractions Prescribed: 10
Plan Total Prescribed Dose: 30 Gy
Reference Point Dosage Given to Date: 9 Gy
Reference Point Session Dosage Given: 3 Gy
Session Number: 3

## 2022-12-09 MED ORDER — OXYCODONE-ACETAMINOPHEN 5-325 MG PO TABS: 1.0000 | ORAL_TABLET | ORAL | 0 refills | Status: DC | PRN

## 2022-12-09 NOTE — Telephone Encounter (Signed)
Ms. Sara [name] earlier requesting refill for Oxycodone.  Received 1 treatment of SRS to brain on 12/05/2022 and now has received 3 of 10 tx to posterior fossa.  Has Decadron order 4 mg twice a day. Sara [name] has complaints of headaches and said that she isn't taking the Decadron as you prescribed because it make has nauseous and vomit.  She said that she has been cutting the pills in quarters and taking like that not the whole tablet.  RN expressed to her that she will need to take Decadron as prescribed not like she's been taking it and that will help to alleviate headaches by reducing the swelling.  And to take the Zofran to help with the nausea/vomiting.  RN called patient per Sara Fennel, Sara [name] to inform her that pain medication was called in to the pharmacy and that it will the last refill from our office.  Advised Sara [name] that she will need to rescheduled her appointment that she missed today with Sara [name] medical oncologist and that any other refills be need to be prescribed from his office.  Sara [name] was advised to take Decadron medication as prescribed she stated that her doctor got onto her and will take as prescribed.  She verbalized understanding and has rescheduled with Sara [name] for 12/22/2022.  She appreciated the call and thanks everyone for assisting her.

## 2022-12-10 ENCOUNTER — Other Ambulatory Visit: Payer: Self-pay

## 2022-12-10 ENCOUNTER — Ambulatory Visit
Admission: RE | Admit: 2022-12-10 | Discharge: 2022-12-10 | Disposition: A | Discharge status: Home / Self Care | Payer: Medicaid Other | Source: Ambulatory Visit | Attending: Radiation Oncology | Admitting: Radiation Oncology

## 2022-12-10 ENCOUNTER — Inpatient Hospital Stay: Payer: Medicaid Other | Admitting: Licensed Clinical Social Worker

## 2022-12-10 ENCOUNTER — Encounter: Payer: Self-pay | Admitting: Hematology

## 2022-12-10 ENCOUNTER — Inpatient Hospital Stay: Payer: Medicaid Other

## 2022-12-10 DIAGNOSIS — C50111 Malignant neoplasm of central portion of right female breast: Secondary | ICD-10-CM

## 2022-12-10 DIAGNOSIS — Z510 Encounter for antineoplastic radiation therapy: Secondary | ICD-10-CM | POA: Diagnosis not present

## 2022-12-10 LAB — RAD ONC ARIA SESSION SUMMARY
Course Elapsed Days: 5
Plan Fractions Treated to Date: 4
Plan Prescribed Dose Per Fraction: 3 Gy
Plan Total Fractions Prescribed: 10
Plan Total Prescribed Dose: 30 Gy
Reference Point Dosage Given to Date: 12 Gy
Reference Point Session Dosage Given: 3 Gy
Session Number: 4

## 2022-12-10 NOTE — Progress Notes (Signed)
[facility] CSW Progress Note  Visual merchandiser met with patient following radiation treatment as she was distressed and requested to speak with social work.  CSW familiar w/ pt from previous treatment at [facility].  Pt reports she stopped treatment prior as she was feeling better and did not want her daughter to have to watch her go through treatment.  Pt states she was also in an unhealthy relationship and recognized that she needed to separate herself from her boyfriend before she could take care of herself.  Pt reports she left her boyfriend in October and has stopped using all substances.  Pt is living with her father again and after admitting to the hospital where she was informed she now has metastatic disease in her brain she states she is committed to following through with treatment.  Pt states she lost her glasses which she is unable to see without and Medicaid will not pay for a new pair for another year.  CSW provided pt w/ [employer] cards to assist w/ the cost of the glasses.  Per pt she should still have some of her Adela Ports left at [facility] which she will check into once she resumes chemotherapy.  Pt requesting referral to dietician as she is having difficulty affording Ens[employer].  Dietician informed and will schedule an appointment.  CSW sent an email to Marijean Niemann with whom pt was previously connected to restart food delivery.  Pt given information for Sherri Sear to consider for her daughter this summer.  CSW to remain available to provide support as appropriate throughout duration of treatment.      Rachel Moulds, LCSW    Patient is participating in a Managed Medicaid Plan:  Yes

## 2022-12-11 ENCOUNTER — Telehealth: Payer: Self-pay

## 2022-12-11 ENCOUNTER — Other Ambulatory Visit: Payer: Self-pay | Admitting: Urology

## 2022-12-11 ENCOUNTER — Other Ambulatory Visit (HOSPITAL_COMMUNITY): Payer: Self-pay

## 2022-12-11 ENCOUNTER — Other Ambulatory Visit: Payer: Self-pay

## 2022-12-11 ENCOUNTER — Ambulatory Visit
Admission: RE | Admit: 2022-12-11 | Discharge: 2022-12-11 | Disposition: A | Discharge status: Home / Self Care | Payer: Medicaid Other | Source: Ambulatory Visit | Attending: Radiation Oncology | Admitting: Radiation Oncology

## 2022-12-11 DIAGNOSIS — Z510 Encounter for antineoplastic radiation therapy: Secondary | ICD-10-CM | POA: Diagnosis not present

## 2022-12-11 DIAGNOSIS — Z170 Estrogen receptor positive status [ER+]: Secondary | ICD-10-CM

## 2022-12-11 DIAGNOSIS — C50911 Malignant neoplasm of unspecified site of right female breast: Secondary | ICD-10-CM

## 2022-12-11 LAB — RAD ONC ARIA SESSION SUMMARY
Course Elapsed Days: 6
Plan Fractions Treated to Date: 5
Plan Prescribed Dose Per Fraction: 3 Gy
Plan Total Fractions Prescribed: 10
Plan Total Prescribed Dose: 30 Gy
Reference Point Dosage Given to Date: 15 Gy
Reference Point Session Dosage Given: 3 Gy
Session Number: 5

## 2022-12-11 MED ORDER — OXYCODONE-ACETAMINOPHEN 5-325 MG PO TABS
1.0000 | ORAL_TABLET | ORAL | 0 refills | Status: DC | PRN
  Filled 2022-12-11: qty 60, 10d supply, fill #0

## 2022-12-11 NOTE — Progress Notes (Signed)
Lab order placed per Dr. Ellin Saba

## 2022-12-11 NOTE — Telephone Encounter (Signed)
RN called patient per her request she wanted to have pain medication refill sent to [facility] since transportation is difficult to get to the [facility] pharmacy in [place] close to her home.  RN called [facility] pharmacy in [place] canceled prescription per [employer], PA-C and she will send in last and final refill prescription to [facility] today.  Sara [name] was reminded that this is the last time medication refill will be ordered by Marcello Fennel, PA-C and will need to talk to her medical oncologist for further refill requests.  Sara [name] verbalized understanding and hung phone up.

## 2022-12-12 ENCOUNTER — Inpatient Hospital Stay: Payer: Medicaid Other

## 2022-12-12 ENCOUNTER — Other Ambulatory Visit: Payer: Self-pay | Admitting: Radiation Oncology

## 2022-12-12 ENCOUNTER — Other Ambulatory Visit: Payer: Self-pay

## 2022-12-12 ENCOUNTER — Ambulatory Visit
Admission: RE | Admit: 2022-12-12 | Discharge: 2022-12-12 | Disposition: A | Discharge status: Home / Self Care | Payer: Medicaid Other | Source: Ambulatory Visit | Attending: Radiation Oncology | Admitting: Radiation Oncology

## 2022-12-12 DIAGNOSIS — Z510 Encounter for antineoplastic radiation therapy: Secondary | ICD-10-CM | POA: Diagnosis not present

## 2022-12-12 LAB — RAD ONC ARIA SESSION SUMMARY
Course Elapsed Days: 7
Plan Fractions Treated to Date: 6
Plan Prescribed Dose Per Fraction: 3 Gy
Plan Total Fractions Prescribed: 10
Plan Total Prescribed Dose: 30 Gy
Reference Point Dosage Given to Date: 18 Gy
Reference Point Session Dosage Given: 3 Gy
Session Number: 6

## 2022-12-12 MED ORDER — PROCHLORPERAZINE MALEATE 10 MG PO TABS: 10.0000 mg | ORAL_TABLET | Freq: Four times a day (QID) | ORAL | 5 refills | Status: DC | PRN

## 2022-12-15 ENCOUNTER — Ambulatory Visit
Admission: RE | Admit: 2022-12-15 | Discharge: 2022-12-15 | Disposition: A | Discharge status: Home / Self Care | Payer: Medicaid Other | Source: Ambulatory Visit | Attending: Radiation Oncology | Admitting: Radiation Oncology

## 2022-12-15 ENCOUNTER — Inpatient Hospital Stay: Payer: Medicaid Other | Admitting: Licensed Clinical Social Worker

## 2022-12-15 ENCOUNTER — Other Ambulatory Visit: Payer: Self-pay

## 2022-12-15 ENCOUNTER — Ambulatory Visit (HOSPITAL_COMMUNITY): Payer: Medicaid Other | Admitting: Occupational Therapy

## 2022-12-15 ENCOUNTER — Inpatient Hospital Stay: Payer: Medicaid Other

## 2022-12-15 DIAGNOSIS — C50111 Malignant neoplasm of central portion of right female breast: Secondary | ICD-10-CM

## 2022-12-15 DIAGNOSIS — Z510 Encounter for antineoplastic radiation therapy: Secondary | ICD-10-CM | POA: Diagnosis not present

## 2022-12-15 LAB — RAD ONC ARIA SESSION SUMMARY
Course Elapsed Days: 10
Plan Fractions Treated to Date: 7
Plan Prescribed Dose Per Fraction: 3 Gy
Plan Total Fractions Prescribed: 10
Plan Total Prescribed Dose: 30 Gy
Reference Point Dosage Given to Date: 21 Gy
Reference Point Session Dosage Given: 3 Gy
Session Number: 7

## 2022-12-15 NOTE — Progress Notes (Signed)
[facility] CSW Progress Note  Clinical Child psychotherapist  returned pt's call regarding picking up ensure.  Pt informed she will need to complete an intake with the nutritionist before she will be able to pick up ensure which is scheduled for Thursday, May 2nd.  At that time pt will be instructed regarding pick up of ensure.  Pt verbalized understanding of the above.  CSW to remain available as appropriate throughout duration of treatment to provide support.        Rachel Moulds, LCSW    Patient is participating in a Managed Medicaid Plan:  Yes

## 2022-12-16 ENCOUNTER — Inpatient Hospital Stay: Payer: Medicaid Other

## 2022-12-16 ENCOUNTER — Ambulatory Visit
Admission: RE | Admit: 2022-12-16 | Discharge: 2022-12-16 | Disposition: A | Discharge status: Home / Self Care | Payer: Medicaid Other | Source: Ambulatory Visit | Attending: Radiation Oncology | Admitting: Radiation Oncology

## 2022-12-16 ENCOUNTER — Other Ambulatory Visit: Payer: Self-pay

## 2022-12-16 ENCOUNTER — Encounter: Payer: Self-pay | Admitting: Hematology

## 2022-12-16 ENCOUNTER — Inpatient Hospital Stay (HOSPITAL_BASED_OUTPATIENT_CLINIC_OR_DEPARTMENT_OTHER): Payer: Medicaid Other | Admitting: Hematology

## 2022-12-16 VITALS — BP 112/62 | HR 94 | Temp 97.7°F | Resp 18 | Wt 136.6 lb

## 2022-12-16 DIAGNOSIS — C7931 Secondary malignant neoplasm of brain: Secondary | ICD-10-CM | POA: Insufficient documentation

## 2022-12-16 DIAGNOSIS — C50911 Malignant neoplasm of unspecified site of right female breast: Secondary | ICD-10-CM | POA: Insufficient documentation

## 2022-12-16 DIAGNOSIS — Z510 Encounter for antineoplastic radiation therapy: Secondary | ICD-10-CM | POA: Insufficient documentation

## 2022-12-16 DIAGNOSIS — C787 Secondary malignant neoplasm of liver and intrahepatic bile duct: Secondary | ICD-10-CM | POA: Diagnosis not present

## 2022-12-16 DIAGNOSIS — C50111 Malignant neoplasm of central portion of right female breast: Secondary | ICD-10-CM | POA: Diagnosis not present

## 2022-12-16 DIAGNOSIS — Z170 Estrogen receptor positive status [ER+]: Secondary | ICD-10-CM

## 2022-12-16 DIAGNOSIS — Z95828 Presence of other vascular implants and grafts: Secondary | ICD-10-CM

## 2022-12-16 DIAGNOSIS — Z87891 Personal history of nicotine dependence: Secondary | ICD-10-CM | POA: Insufficient documentation

## 2022-12-16 DIAGNOSIS — C7951 Secondary malignant neoplasm of bone: Secondary | ICD-10-CM | POA: Insufficient documentation

## 2022-12-16 LAB — CBC WITH DIFFERENTIAL/PLATELET
Abs Immature Granulocytes: 0.23 10*3/uL — ABNORMAL HIGH (ref 0.00–0.07)
Basophils Absolute: 0 10*3/uL (ref 0.0–0.1)
Basophils Relative: 0 %
Eosinophils Absolute: 0 10*3/uL (ref 0.0–0.5)
Eosinophils Relative: 0 %
HCT: 30.6 % — ABNORMAL LOW (ref 36.0–46.0)
Hemoglobin: 9.6 g/dL — ABNORMAL LOW (ref 12.0–15.0)
Immature Granulocytes: 1 %
Lymphocytes Relative: 5 %
Lymphs Abs: 0.9 10*3/uL (ref 0.7–4.0)
MCH: 31.2 pg (ref 26.0–34.0)
MCHC: 31.4 g/dL (ref 30.0–36.0)
MCV: 99.4 fL (ref 80.0–100.0)
Monocytes Absolute: 1.2 10*3/uL — ABNORMAL HIGH (ref 0.1–1.0)
Monocytes Relative: 7 %
Neutro Abs: 14.4 10*3/uL — ABNORMAL HIGH (ref 1.7–7.7)
Neutrophils Relative %: 87 %
Platelets: 223 10*3/uL (ref 150–400)
RBC: 3.08 MIL/uL — ABNORMAL LOW (ref 3.87–5.11)
RDW: 13.8 % (ref 11.5–15.5)
WBC: 16.7 10*3/uL — ABNORMAL HIGH (ref 4.0–10.5)
nRBC: 0 % (ref 0.0–0.2)

## 2022-12-16 LAB — RAD ONC ARIA SESSION SUMMARY
Course Elapsed Days: 11
Plan Fractions Treated to Date: 8
Plan Prescribed Dose Per Fraction: 3 Gy
Plan Total Fractions Prescribed: 10
Plan Total Prescribed Dose: 30 Gy
Reference Point Dosage Given to Date: 24 Gy
Reference Point Session Dosage Given: 3 Gy
Session Number: 8

## 2022-12-16 LAB — COMPREHENSIVE METABOLIC PANEL
ALT: 32 U/L (ref 0–44)
AST: 25 U/L (ref 15–41)
Albumin: 3.2 g/dL — ABNORMAL LOW (ref 3.5–5.0)
Alkaline Phosphatase: 42 U/L (ref 38–126)
Anion gap: 9 (ref 5–15)
BUN: 31 mg/dL — ABNORMAL HIGH (ref 6–20)
CO2: 23 mmol/L (ref 22–32)
Calcium: 8.3 mg/dL — ABNORMAL LOW (ref 8.9–10.3)
Chloride: 100 mmol/L (ref 98–111)
Creatinine, Ser: 0.7 mg/dL (ref 0.44–1.00)
GFR, Estimated: >60 mL/min (ref >60)
Glucose, Bld: 92 mg/dL (ref 70–99)
Potassium: 3.8 mmol/L (ref 3.5–5.1)
Sodium: 132 mmol/L — ABNORMAL LOW (ref 135–145)
Total Bilirubin: 0.6 mg/dL (ref 0.3–1.2)
Total Protein: 6.3 g/dL — ABNORMAL LOW (ref 6.5–8.1)

## 2022-12-16 LAB — RAPID URINE DRUG SCREEN, HOSP PERFORMED
Amphetamines: NOT DETECTED
Barbiturates: NOT DETECTED
Benzodiazepines: NOT DETECTED
Cocaine: NOT DETECTED
Opiates: POSITIVE — AB
Tetrahydrocannabinol: POSITIVE — AB

## 2022-12-16 LAB — MAGNESIUM: Magnesium: 1.9 mg/dL (ref 1.7–2.4)

## 2022-12-16 MED ORDER — OXYCODONE-ACETAMINOPHEN 7.5-325 MG PO TABS: 1.0000 | ORAL_TABLET | Freq: Four times a day (QID) | ORAL | 0 refills | Status: DC | PRN

## 2022-12-16 MED ORDER — ALPRAZOLAM 0.5 MG PO TABS: 0.5000 mg | ORAL_TABLET | Freq: Every evening | ORAL | 0 refills | Status: DC | PRN

## 2022-12-16 MED ORDER — SODIUM CHLORIDE 0.9% FLUSH
10.0000 mL | Freq: Once | INTRAVENOUS | Status: AC
  Administered 2022-12-16: 10 mL via INTRAVENOUS

## 2022-12-16 MED ORDER — HEPARIN SOD (PORK) LOCK FLUSH 100 UNIT/ML IV SOLN
500.0000 [IU] | Freq: Once | INTRAVENOUS | Status: AC
  Administered 2022-12-16: 500 [IU] via INTRAVENOUS

## 2022-12-16 NOTE — Progress Notes (Signed)
[facility] 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  PRIOR THERAPY: DOCEtaxel + Trastuzumab + Pertuzumab (THP) q21d x 4 cycles  NGS Results: not done  CURRENT THERAPY: Enhertu  BRIEF ONCOLOGIC HISTORY:  Oncology History  HER2-positive carcinoma of right breast (HCC)  10/28/2021 Initial Diagnosis   Breast cancer, right breast (HCC)   12/03/2021 -  Chemotherapy   Patient is on Treatment Plan : BREAST DOCEtaxel + Trastuzumab + Pertuzumab (THP) q21d x 8 cycles / Trastuzumab + Pertuzumab q21d x 4 cycles       CANCER STAGING:  Cancer Staging  HER2-positive carcinoma of right breast (HCC) Staging form: Breast, AJCC 8th Edition - Clinical stage from 10/28/2021: Stage IV (cT4d, cN1, cM1, G3, ER+, PR-, HER2+) - Unsigned   INTERVAL HISTORY:  Ms. Sara [name], a 47 y.o. female, seen for follow-up of metastatic HER2 positive breast cancer.  She was last seen by me in July 2023 and was lost to follow-up.  She was recently diagnosed with brain metastasis and is undergoing radiation therapy.  She reports weight gain of 40 pounds in the last 3 weeks since she has been put on dexamethasone 4 mg twice daily which was recently decreased to once daily.  She is reportedly taking Percocet 5/325 twice daily for chest, back and right ankle pains.   REVIEW OF SYSTEMS:  Review of Systems  Constitutional:  Positive for unexpected weight change (Gain of 40 pounds in 3 weeks).  Respiratory:  Positive for cough and shortness of breath.   Gastrointestinal:  Positive for nausea and vomiting.  Musculoskeletal:  Positive for back pain.  Neurological:  Positive for dizziness, headaches and numbness (Right arm).  Psychiatric/Behavioral:  Positive for sleep disturbance.   All other systems reviewed  and are negative.   PAST MEDICAL/SURGICAL HISTORY:  Past Medical History:  Diagnosis Date   Anxiety    Back pain    Cancer [facility])    Past Surgical History:  Procedure Laterality Date   CHOLECYSTECTOMY     ORTHOPEDIC SURGERY     PORTACATH PLACEMENT Left 11/18/2021   Procedure: INSERTION PORT-A-CATH;  Surgeon: Lucretia Roers, MD;  Location: [facility];  Service: General;  Laterality: Left;   SUBOCCIPITAL CRANIECTOMY CERVICAL LAMINECTOMY N/A 11/12/2022   Procedure: SUBOCCIPITAL CRANIECTOMY FOR RESECTION OF CEREBELLAR MASS;  Surgeon: Donalee Citrin, MD;  Location: [facility] OR;  Service: Neurosurgery;  Laterality: N/A;    SOCIAL HISTORY:  Social History   Socioeconomic History   Marital status: Legally Separated    Spouse name: Not on file   Number of children: Not on file   Years of education: Not on file   Highest education level: Not on file  Occupational History   Not on file  Tobacco Use   Smoking status: Former    Packs/day: 1.00    Years: 5.00    Additional pack years: 0.00    Total pack years: 5.00    Types: Cigarettes    Quit date: 11/08/2021    Years since quitting: 1.1   Smokeless tobacco: Never  Vaping Use   Vaping Use: Never used  Substance and Sexual Activity   Alcohol use: No    Comment: occasionally   Drug use: No   Sexual activity: Yes    Birth  control/protection: None  Other Topics Concern   Not on file  Social History Narrative   Not on file   Social Determinants of Health   Financial Resource Strain: High Risk (12/03/2021)   Overall Financial Resource Strain (CARDIA)    Difficulty of Paying Living Expenses: Very hard  Food Insecurity: No Food Insecurity (11/25/2022)   Hunger Vital Sign    Worried About Running Out of Food in the Last Year: Never true    Ran Out of Food in the Last Year: Never true  Transportation Needs: No Transportation Needs (11/25/2022)   PRAPARE - Administrator, Civil Service (Medical): No    Lack of Transportation  (Non-Medical): No  Recent Concern: Transportation Needs - Unmet Transportation Needs (11/11/2022)   PRAPARE - Transportation    Lack of Transportation (Medical): Yes    Lack of Transportation (Non-Medical): Yes  Physical Activity: Sufficiently Active (06/17/2021)   Exercise Vital Sign    Days of Exercise per Week: 5 days    Minutes of Exercise per Session: 30 min  Stress: Stress Concern Present (06/17/2021)   [employer] of Occupational Health - Occupational Stress Questionnaire    Feeling of Stress : Rather much  Social Connections: Socially Isolated (06/17/2021)   Social Connection and Isolation Panel [NHANES]    Frequency of Communication with Friends and Family: Twice a week    Frequency of Social Gatherings with Friends and Family: Never    Attends Religious Services: 1 to 4 times per year    Active Member of [employer] or Organizations: No    Attends Banker Meetings: Never    Marital Status: Divorced  Catering manager Violence: Not At Risk (11/25/2022)   Humiliation, Afraid, Rape, and Kick questionnaire    Fear of Current or Ex-Partner: No    Emotionally Abused: No    Physically Abused: No    Sexually Abused: No    FAMILY HISTORY:  Family History  Problem Relation Age of Onset   Heart disease Brother    Lung cancer Paternal Grandmother        lung   Cancer Paternal Grandfather    Asthma Daughter    Asthma Daughter     CURRENT MEDICATIONS:  Current Outpatient Medications  Medication Sig Dispense Refill   dexamethasone (DECADRON) 4 MG tablet Take 1 tablet (4 mg total) by mouth 2 (two) times daily with a meal. 60 tablet 0   docusate sodium (COLACE) 100 MG capsule Take 1 capsule (100 mg total) by mouth 2 (two) times daily.     feeding supplement (ENSURE ENLIVE / ENSURE PLUS) LIQD Take 237 mLs by mouth 3 (three) times daily between meals. 21330 mL 0   ondansetron (ZOFRAN) 8 MG tablet Take 8 mg by mouth every 8 (eight) hours as needed.     pantoprazole  (PROTONIX) 20 MG tablet Take 1 tablet (20 mg total) by mouth daily. 30 tablet 5   prochlorperazine (COMPAZINE) 10 MG tablet Take 1 tablet (10 mg total) by mouth every 6 (six) hours as needed for nausea or vomiting. 30 tablet 5   ALPRAZolam (XANAX) 0.5 MG tablet Take 1 tablet (0.5 mg total) by mouth at bedtime as needed for anxiety. 30 tablet 0   oxyCODONE-acetaminophen (PERCOCET) 7.5-325 MG tablet Take 1 tablet by mouth every 6 (six) hours as needed for severe pain. 56 tablet 0   No current facility-administered medications for this visit.    ALLERGIES:  Allergies  Allergen Reactions   Hydrocodone Itching,  Nausea And Vomiting, Rash and Other (See Comments)    Hallucinations when given vicodin post crani in 2024   Sulfa Antibiotics Nausea And Vomiting   Morphine And Related Rash    PHYSICAL EXAM:  Performance status (ECOG): 0 - Asymptomatic  There were no vitals filed for this visit. Wt Readings from Last 3 Encounters:  12/16/22 136 lb 9.6 oz (62 kg)  11/25/22 98 lb (44.5 kg)  11/12/22 96 lb 9 oz (43.8 kg)   Physical Exam Vitals reviewed.  Constitutional:      Appearance: Normal appearance.  Cardiovascular:     Rate and Rhythm: Normal rate and regular rhythm.     Pulses: Normal pulses.     Heart sounds: Normal heart sounds.  Pulmonary:     Effort: Pulmonary effort is normal.     Breath sounds: Normal breath sounds.  Neurological:     General: No focal deficit present.     Mental Status: She is alert and oriented to person, place, and time.  Psychiatric:        Mood and Affect: Mood normal.        Behavior: Behavior normal.     LABORATORY DATA:  I have reviewed the labs as listed.     Latest Ref Rng & Units 12/16/2022    8:01 AM 11/16/2022    7:51 AM 11/12/2022    6:16 PM  CBC  WBC 4.0 - 10.5 K/uL 16.7  10.1  9.5   Hemoglobin 12.0 - 15.0 g/dL 9.6  16.1  09.6   Hematocrit 36.0 - 46.0 % 30.6  30.8  32.0   Platelets 150 - 400 K/uL 223  230  242       Latest Ref Rng  & Units 12/16/2022    8:01 AM 11/16/2022    7:51 AM 11/13/2022    3:36 AM  CMP  Glucose 70 - 99 mg/dL 92  83  045   BUN 6 - 20 mg/dL 31  14  15    Creatinine 0.44 - 1.00 mg/dL 4.09  8.11  9.14   Sodium 135 - 145 mmol/L 132  131  133   Potassium 3.5 - 5.1 mmol/L 3.8  4.9  4.1   Chloride 98 - 111 mmol/L 100  93  96   CO2 22 - 32 mmol/L 23  28  26    Calcium 8.9 - 10.3 mg/dL 8.3  6.7  9.4   Total Protein 6.5 - 8.1 g/dL 6.3  5.8    Total Bilirubin 0.3 - 1.2 mg/dL 0.6  0.3    Alkaline Phos 38 - 126 U/L 42  39    AST 15 - 41 U/L 25  32    ALT 0 - 44 U/L 32  96      DIAGNOSTIC IMAGING:  I have independently reviewed the scans and discussed with the patient. MR Brain W Wo Contrast  Result Date: 12/03/2022 CLINICAL DATA:  Brain metastases, assess treatment response 3T SRS Protocol for post op radiation treatment planning EXAM: MRI HEAD WITHOUT AND WITH CONTRAST TECHNIQUE: Multiplanar, multiecho pulse sequences of the brain and surrounding structures were obtained without and with intravenous contrast. CONTRAST:  5mL GADAVIST GADOBUTROL 1 MMOL/ML IV SOLN COMPARISON:  November 11, 2022. FINDINGS: Brain: Mild interval increase in size of a right parietal enhancing lesion, now measuring approximately 12 x 13 mm (previously 11 x 10 mm). Surrounding edema is mildly increased. New punctate focus a of enhancement in the right caudate, concerning for new metastasis.  Prior right cerebellar metastatic lesion resection without nodular enhancement this site to suggest residual lesion. Mild decrease in size of the fluid collection in the posterior paraseptal soft tissues. Small amount of herniated cerebellar tissue through the defect, similar. No evidence of acute infarct, midline shift or hydrocephalus. Vascular: Major arterial flow voids are maintained at the skull base. Skull and upper cervical spine: Occipital craniotomy. Otherwise, normal marrow signal. Sinuses/Orbits: Clear sinuses.  No acute orbital findings.  IMPRESSION: 1. Mild interval increase in size of a right parietal enhancing lesion, now measuring approximately 12 x 13 mm (previously 11 x 10 mm). Mild increase in surrounding edema. 2. New punctate focus a of enhancement in the right caudate, concerning for new metastasis. 3. Prior right cerebellar metastatic lesion resection without evidence of residual lesion at this site. Herniation of a small left cerebellum through the craniotomy defect, not substantially changed. Electronically Signed   By: Feliberto Harts M.D.   On: 12/03/2022 16:09     ASSESSMENT:  Her2+ metastatic breast cancer: - She reports feeling a knot in the right breast in July 2022.  She felt the same lump in September which has increased slightly in November.  In the last 3 months, it has increased in size from a quarter to size of an orange. - Mammogram/ultrasound on 10/01/2021: Large irregular mass involving the entire central right breast measuring 4.9 x 2.4 x 4 cm at 12:00.  At least 3 lymph nodes in the right axilla.  Overlying skin thickening and generalized erythema and marked tenderness. - Right breast mass and axillary lymph node biopsy on 10/08/2021: - Pathology: Poorly differentiated ductal carcinoma, grade 3, HER2 3+ positive, ER 5% strong staining intensity, PR negative, Ki-67 25%. - CT chest with contrast on 10/24/2021 at [facility]: Possible osseous metastatic disease involving left third and 12th ribs, manubrium, L1 vertebral body and left pedicle.  Enlarging 2.4 cm lesion in the central liver. - Physical examination shows mass and erythema involving most of the right breast, with warmth and orange peel appearance highly suggestive of inflammatory breast cancer. - PET scan (11/30/2021): Hypermetabolic tumor involving right breast, metastatic mediastinal and hilar lymphadenopathy, hepatic metastatic disease, diffuse lytic bone lesions. - 4 cycles of THP from 12/03/2021 through 02/25/2022    Social/family history: - She is  currently living with her father, youngest daughter and sister.  She has 2 daughters ages 65 and 81.  She has worked in Journalist, newspaper previously.  She quit smoking 3 months ago.  Prior to that she smoked a pack a day for 4 to 5 years and less than half pack a day since 2011.  She vapes occasionally at this time. - Paternal great grandmother had lung cancer.  Paternal aunt had lung cancer at age 77.  Paternal grandmother had breast cancer in her 38s.  Paternal grandfather had colon cancer.  Maternal grandfather had colon cancer.   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 - CT CAP on 11/10/2022: Significant improved appearance of right breast.  No adenopathy was seen.  No lung metastasis.  No residual hepatic lesions.  Healing changes of lytic bone lesions. - Cerebellar mass resection (11/12/2022): Metastatic carcinoma with tumor cells positive for CK7, GATA3 indicating breast primary.  ER and CK20 are negative. - I will request HER2 by IHC on pathology. - We talked about restarting systemic therapy versus best supportive care in the  form of hospice. - She is wanting to restart systemic therapy. - We discussed therapy with Enhertu which has activity in the brain particularly in the setting of metastatic disease.  We discussed side effects in detail. - Recommend a PET CT scan as baseline. - Will also obtain 2D echocardiogram.  2.  Brain metastasis: - Brain MRI on 11/11/2022: 3.8 cm right cerebellar metastasis with vasogenic edema.  13 mm metastasis in the right parietal lobe.  Small focus in the right caudate lobe. - She is receiving radiation therapy. - Continue dexamethasone 4 mg daily.  3.  Anxiety: - I will give her prescription for Xanax 0.5 mg at bedtime as needed.  4.  Chest pain, back pain, right ankle pain: - She is reportedly taking Percocet 5/325 1-2 tablets 4 times daily. -  I will increase Percocet 7.5/325 every 6 hours as needed.  We will continue to monitor her urine drug screens. - Continue prochlorperazine for nausea.   Orders placed this encounter:  Orders Placed This Encounter  Procedures   NM PET Image Restag (PS) Skull Base To Thigh   ECHOCARDIOGRAM COMPLETE      Doreatha Massed, MD [facility] [phone]

## 2022-12-16 NOTE — Progress Notes (Signed)
DISCONTINUE ON PATHWAY REGIMEN - Breast     Cycle 1: A cycle is 21 days:     Pertuzumab      Trastuzumab-xxxx      Docetaxel    Cycles 2 and beyond: A cycle is every 21 days:     Pertuzumab      Trastuzumab-xxxx      Docetaxel   **Always confirm dose/schedule in your pharmacy ordering system**  REASON: Other Reason PRIOR TREATMENT: [id number]: Docetaxel + Trastuzumab IV + Pertuzumab IV (THP IV) q21 Days x 6-8 Cycles, Followed by Trastuzumab IV + Pertuzumab IV q21 Days TREATMENT RESPONSE: Partial Response (PR)  START ON PATHWAY REGIMEN - Breast     A cycle is every 21 days:     Fam-trastuzumab deruxtecan-nxki   **Always confirm dose/schedule in your pharmacy ordering system**  Patient Characteristics: Distant Metastases or Locoregional Recurrent Disease - Unresected, M0 or Locally Advanced Unresectable Disease Progressing after Neoadjuvant and Local Therapies, M0, HER2 Positive, ER Positive, Chemotherapy + HER2-Targeted Therapy, Second Line Therapeutic Status: Distant Metastases HER2 Status: Positive (+) ER Status: Positive (+) PR Status: Negative (-) Line of Therapy: Second Line Intent of Therapy: Non-Curative / Palliative Intent, Discussed with Patient

## 2022-12-16 NOTE — Patient Instructions (Addendum)
[facility]  Discharge Instructions  You were seen and examined today by Dr. Ellin Saba.  Dr. Ellin Saba has reviewed your recent scans and radiation treatments. Your cancer has significantly progressed since you were last seen by Dr. Ellin Saba.  Dr. Ellin Saba has recommended a change in treatment.  Dr. Ellin Saba will arrange a PET scan. The CT scan did look like you had a good response to chemotherapy. Unfortunately, the cancer did spread to the brain.  Dr. Ellin Saba has recommended starting a new medication called Enhertu which is given once every 3 weeks here in the Cancer Center.  Follow-up as scheduled.  Thank you for choosing [facility] to provide your oncology and hematology care.   To afford each patient quality time with our provider, please arrive at least 15 minutes before your scheduled appointment time. You may need to reschedule your appointment if you arrive late (10 or more minutes). Arriving late affects you and other patients whose appointments are after yours.  Also, if you miss three or more appointments without notifying the office, you may be dismissed from the clinic at the provider's discretion.    Again, thank you for choosing [facility].  Our hope is that these requests will decrease the amount of time that you wait before being seen by our physicians.   If you have a lab appointment with the Cancer Center - please note that after April 8th, all labs will be drawn in the cancer center.  You do not have to check in or register with the main entrance as you have in the past but will complete your check-in at the cancer center.            _____________________________________________________________  Should you have questions after your visit to [facility], please contact our office at [phone] and follow the prompts.  Our office hours are 8:00 a.m. to 4:30 p.m. Monday -  Thursday and 8:00 a.m. to 2:30 p.m. Friday.  Please note that voicemails left after 4:00 p.m. may not be returned until the following business day.  We are closed weekends and all major holidays.  You do have access to a nurse 24-7, just call the main number to the clinic [phone] and do not press any options, hold on the line and a nurse will answer the phone.    For prescription refill requests, have your pharmacy contact our office and allow 72 hours.    Masks are no longer required in the cancer centers. If you would like for your care team to wear a mask while they are taking care of you, please let them know. You may have one support person who is at least 46 years old accompany you for your appointments.

## 2022-12-17 ENCOUNTER — Inpatient Hospital Stay: Payer: Medicaid Other | Attending: Hematology

## 2022-12-17 ENCOUNTER — Ambulatory Visit: Payer: Medicaid Other

## 2022-12-17 DIAGNOSIS — Z87891 Personal history of nicotine dependence: Secondary | ICD-10-CM | POA: Insufficient documentation

## 2022-12-17 DIAGNOSIS — Z170 Estrogen receptor positive status [ER+]: Secondary | ICD-10-CM | POA: Insufficient documentation

## 2022-12-17 DIAGNOSIS — Z5189 Encounter for other specified aftercare: Secondary | ICD-10-CM | POA: Insufficient documentation

## 2022-12-17 DIAGNOSIS — Z5112 Encounter for antineoplastic immunotherapy: Secondary | ICD-10-CM | POA: Insufficient documentation

## 2022-12-17 DIAGNOSIS — C50811 Malignant neoplasm of overlapping sites of right female breast: Secondary | ICD-10-CM | POA: Insufficient documentation

## 2022-12-17 DIAGNOSIS — C787 Secondary malignant neoplasm of liver and intrahepatic bile duct: Secondary | ICD-10-CM | POA: Insufficient documentation

## 2022-12-17 DIAGNOSIS — C7951 Secondary malignant neoplasm of bone: Secondary | ICD-10-CM | POA: Insufficient documentation

## 2022-12-17 DIAGNOSIS — C773 Secondary and unspecified malignant neoplasm of axilla and upper limb lymph nodes: Secondary | ICD-10-CM | POA: Insufficient documentation

## 2022-12-17 LAB — CANCER ANTIGEN 27.29: CA 27.29: <9 U/mL (ref 0.0–38.6)

## 2022-12-18 ENCOUNTER — Ambulatory Visit: Payer: Medicaid Other

## 2022-12-18 ENCOUNTER — Other Ambulatory Visit: Payer: Self-pay

## 2022-12-18 ENCOUNTER — Telehealth: Payer: Self-pay | Admitting: Dietician

## 2022-12-18 ENCOUNTER — Inpatient Hospital Stay: Payer: Medicaid Other | Admitting: Dietician

## 2022-12-18 ENCOUNTER — Ambulatory Visit
Admission: RE | Admit: 2022-12-18 | Discharge: 2022-12-18 | Disposition: A | Discharge status: Home / Self Care | Payer: Medicaid Other | Source: Ambulatory Visit | Attending: Radiation Oncology | Admitting: Radiation Oncology

## 2022-12-18 DIAGNOSIS — C7931 Secondary malignant neoplasm of brain: Secondary | ICD-10-CM | POA: Insufficient documentation

## 2022-12-18 DIAGNOSIS — Z170 Estrogen receptor positive status [ER+]: Secondary | ICD-10-CM | POA: Diagnosis not present

## 2022-12-18 DIAGNOSIS — Z510 Encounter for antineoplastic radiation therapy: Secondary | ICD-10-CM | POA: Diagnosis not present

## 2022-12-18 DIAGNOSIS — C50811 Malignant neoplasm of overlapping sites of right female breast: Secondary | ICD-10-CM | POA: Insufficient documentation

## 2022-12-18 LAB — RAD ONC ARIA SESSION SUMMARY
Course Elapsed Days: 13
Plan Fractions Treated to Date: 9
Plan Prescribed Dose Per Fraction: 3 Gy
Plan Total Fractions Prescribed: 10
Plan Total Prescribed Dose: 30 Gy
Reference Point Dosage Given to Date: 27 Gy
Reference Point Session Dosage Given: 3 Gy
Session Number: 9

## 2022-12-18 LAB — CANCER ANTIGEN 15-3: CA 15-3: 11.2 U/mL (ref 0.0–25.0)

## 2022-12-18 MED ORDER — FUROSEMIDE 20 MG PO TABS: 20.0000 mg | ORAL_TABLET | Freq: Every day | ORAL | 0 refills | Status: AC | PRN

## 2022-12-18 NOTE — Telephone Encounter (Signed)
Attempted to contact patient for nutrition appointment as scheduled. Noted patient scheduled for radiation prior to call. RD allowed additional 15 minutes before placing call. Patient did not answer. Left VM with request for return call. Contact information provided.

## 2022-12-19 ENCOUNTER — Inpatient Hospital Stay: Payer: Medicaid Other

## 2022-12-19 ENCOUNTER — Ambulatory Visit
Admission: RE | Admit: 2022-12-19 | Discharge: 2022-12-19 | Disposition: A | Discharge status: Home / Self Care | Payer: Medicaid Other | Source: Ambulatory Visit | Attending: Radiation Oncology | Admitting: Radiation Oncology

## 2022-12-19 ENCOUNTER — Other Ambulatory Visit: Payer: Self-pay

## 2022-12-19 DIAGNOSIS — Z510 Encounter for antineoplastic radiation therapy: Secondary | ICD-10-CM | POA: Diagnosis not present

## 2022-12-19 LAB — RAD ONC ARIA SESSION SUMMARY
Course Elapsed Days: 14
Plan Fractions Treated to Date: 10
Plan Prescribed Dose Per Fraction: 3 Gy
Plan Total Fractions Prescribed: 10
Plan Total Prescribed Dose: 30 Gy
Reference Point Dosage Given to Date: 30 Gy
Reference Point Session Dosage Given: 3 Gy
Session Number: 10

## 2022-12-19 LAB — SURGICAL PATHOLOGY

## 2022-12-22 ENCOUNTER — Encounter: Payer: Medicaid Other | Admitting: Dietician

## 2022-12-22 ENCOUNTER — Ambulatory Visit: Payer: Medicaid Other | Admitting: Hematology

## 2022-12-22 ENCOUNTER — Other Ambulatory Visit: Payer: Medicaid Other

## 2022-12-24 ENCOUNTER — Encounter (HOSPITAL_COMMUNITY): Payer: Medicaid Other | Admitting: Occupational Therapy

## 2022-12-25 ENCOUNTER — Encounter (HOSPITAL_COMMUNITY): Admission: RE | Admit: 2022-12-25 | Payer: Medicaid Other | Source: Ambulatory Visit

## 2022-12-25 NOTE — Radiation Completion Notes (Addendum)
  Radiation Oncology         [phone] ________________________________  Name: Sara [name] MRN: [id number]  Date: 12/19/2022  DOB: 09-26-1975  Patient Name: Sara [name], Sara [name] MRN: [id number] Date of Birth: 10-23-1975 Referring Physician: Donalee Citrin, M.D. Date of Service: 2022-12-25 Radiation Oncologist: Margaretmary Bayley, M.D. [facility]     RADIATION ONCOLOGY END OF TREATMENT NOTE     Diagnosis: 47 yo woman with brain metastases secondary to metastatic breast cancer.   Staging on 2021-10-28: HER2-positive carcinoma of right breast (HCC) T=cT4d, N=cN1, M=cM1 Intent: Palliative     ==========DELIVERED PLANS==========  First Treatment Date: 2022-12-05 - Last Treatment Date: 2022-12-19   Plan Name: Brain_SRS Site: Brain Technique: SBRT/SRT-IMRT Mode: Photon Dose Per Fraction: 20 Gy Prescribed Dose (Delivered / Prescribed): 20 Gy / 20 Gy Prescribed Fxs (Delivered / Prescribed): 1 / 1   Plan Name: Brain_PstFos Site: Brain Technique: 3D Mode: Photon Dose Per Fraction: 3 Gy Prescribed Dose (Delivered / Prescribed): 30 Gy / 30 Gy Prescribed Fxs (Delivered / Prescribed): 10 / 10     ==========ON TREATMENT VISIT DATES========== 2022-12-05, 2022-12-12, 2022-12-16   See weekly On Treatment Notes is Epic for details.  The patient tolerated treatment well was discharged home in stable condition.  The patient will receive a call in about one month from the radiation oncology department. She will continue follow up with her neurosurgeon, Dr. Wynetta Emery as well.  ------------------------------------------------   Margaretmary Dys, MD [facility]  Radiation Oncology Direct Dial: [phone]  Fax: [phone] [email or web address]  Skype  LinkedIn

## 2022-12-31 ENCOUNTER — Other Ambulatory Visit: Payer: Self-pay | Admitting: Hematology

## 2022-12-31 ENCOUNTER — Other Ambulatory Visit: Payer: Self-pay | Admitting: Urology

## 2022-12-31 NOTE — Telephone Encounter (Signed)
Patient needs to call Dr. Ellin Saba since, per patient, he is managing her steroid taper.

## 2022-12-31 NOTE — Progress Notes (Signed)
[facility] [address] [place] [address]    Clinic Day:  01/01/2023  Referring physician: Toma Deiters, MD  Patient Care Team: Toma Deiters, MD as PCP - General (Internal Medicine) Doreatha Massed, MD as Medical Oncologist (Medical Oncology) Therese Sarah, RN as Oncology Nurse Navigator (Oncology)   ASSESSMENT & PLAN:   Assessment: 1. Her2+ metastatic breast cancer: - She reports feeling a knot in the right breast in July 2022.  She felt the same lump in September which has increased slightly in November.  In the last 3 months, it has increased in size from a quarter to size of an orange. - Mammogram/ultrasound on 10/01/2021: Large irregular mass involving the entire central right breast measuring 4.9 x 2.4 x 4 cm at 12:00.  At least 3 lymph nodes in the right axilla.  Overlying skin thickening and generalized erythema and marked tenderness. - Right breast mass and axillary lymph node biopsy on 10/08/2021: - Pathology: Poorly differentiated ductal carcinoma, grade 3, HER2 3+ positive, ER 5% strong staining intensity, PR negative, Ki-67 25%. - PET scan (11/30/2021): Hypermetabolic tumor involving right breast, metastatic mediastinal and hilar lymphadenopathy, hepatic metastatic disease, diffuse lytic bone lesions. - 4 cycles of THP from 12/03/2021 through 02/25/2022 - Cerebellar mass resection (11/12/2022): Metastatic carcinoma with tumor cells positive for CK7, GATA3, indicating breast primary.  ER and CK20 are negative.  HER2 is 3+. - Enhertu started on 01/01/2023   2. Social/family history: - She is currently living with her father, youngest daughter and sister.  She has 2 daughters ages 29 and 59.  She has worked in Journalist, newspaper previously.  She quit smoking 3 months ago.  Prior to that she smoked a pack a day for 4 to 5 years and less than half pack a day since 2011.  She vapes occasionally at this time. - Paternal great grandmother had  lung cancer.  Paternal aunt had lung cancer at age 65.  Paternal grandmother had breast cancer in her 38s.  Paternal grandfather had colon cancer.  Maternal grandfather had colon cancer.    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 - I have counseled her to keep her appointments as scheduled. - Reviewed labs today: ALT 94, alk phos 151.  Rest of the BMP is normal.  Creatinine is normal.  CBC shows hemoglobin improved to 11.9.  CA 15-3 and CA 27-29 were normal. - We talked about side effects of Enhertu in detail.  She will proceed with her first treatment today. - We have reschedule her PET scan next week as a baseline.  We have also rescheduled her echocardiogram. - She complained of left hip pain.  I have recommended to avoid lifting weights more than 5 pounds.  We will follow-up on the PET scan. - RTC 3 weeks for follow-up.  2.  Brain metastasis: - She is on dexamethasone taper per radiation oncology.  3.  Anxiety: - Continue Xanax 0.5 mg at bedtime as needed.  4.  Chest pain, back pain, right ankle pain: - Continue Percocet 7.5/325 every 6 hours as needed. - Continue prochlorperazine for nausea. - Last urine drug screen was positive for THC.  She admits to taking THC Gummies.    Orders Placed This Encounter  Procedures   Magnesium    Standing Status:   Future    Standing Expiration Date:   04/15/2024  CBC with Differential    Standing Status:   Future    Standing Expiration Date:   04/15/2024   Comprehensive metabolic panel    Standing Status:   Future    Standing Expiration Date:   04/15/2024   Urine rapid drug screen (hosp performed)      I,Katie Daubenspeck,acting as a scribe for Doreatha Massed, MD.,have documented all relevant documentation on the behalf of Doreatha Massed, MD,as directed by  Doreatha Massed, MD while in the presence of Doreatha Massed, MD.   I, Doreatha Massed MD, have reviewed the above documentation for accuracy and completeness, and I agree with the above.   Doreatha Massed, MD   5/16/20242:37 PM  CHIEF COMPLAINT:   Diagnosis: Her2+ metastatic right breast cancer    Cancer Staging  HER2-positive carcinoma of right breast [facility]) Staging form: Breast, AJCC 8th Edition - Clinical stage from 10/28/2021: Stage IV (cT4d, cN1, cM1, G3, ER+, PR-, HER2+) - Unsigned    Prior Therapy: DOCEtaxel + Trastuzumab + Pertuzumab (THP) q21d x 4 cycles   Current Therapy:  Enhertu   HISTORY OF PRESENT ILLNESS:   Oncology History  HER2-positive carcinoma of right breast (HCC)  10/28/2021 Initial Diagnosis   Breast cancer, right breast (HCC)   12/03/2021 - 02/25/2022 Chemotherapy   Patient is on Treatment Plan : BREAST DOCEtaxel + Trastuzumab + Pertuzumab (THP) q21d x 8 cycles / Trastuzumab + Pertuzumab q21d x 4 cycles     01/01/2023 -  Chemotherapy   Patient is on Treatment Plan : BREAST METASTATIC Fam-Trastuzumab Deruxtecan-nxki (Enhertu) (5.4) q21d        INTERVAL HISTORY:   Sara [name] is a 47 y.o. female presenting to clinic today for follow up of Her2+ metastatic right breast cancer. She was last seen by me on 12/16/22.  Today, she states that she is doing well overall. Her appetite level is at 75%. Her energy level is at 60%.  PAST MEDICAL HISTORY:   Past Medical History: Past Medical History:  Diagnosis Date   Anxiety    Back pain    Cancer [facility])     Surgical History: Past Surgical History:  Procedure Laterality Date   CHOLECYSTECTOMY     ORTHOPEDIC SURGERY     PORTACATH PLACEMENT Left 11/18/2021   Procedure: INSERTION PORT-A-CATH;  Surgeon: Lucretia Roers, MD;  Location: [facility];  Service: General;  Laterality: Left;   SUBOCCIPITAL CRANIECTOMY CERVICAL LAMINECTOMY N/A 11/12/2022   Procedure: SUBOCCIPITAL CRANIECTOMY FOR RESECTION OF CEREBELLAR MASS;  Surgeon: Donalee Citrin, MD;  Location: [facility];  Service: Neurosurgery;   Laterality: N/A;    Social History: Social History   Socioeconomic History   Marital status: Legally Separated    Spouse name: Not on file   Number of children: Not on file   Years of education: Not on file   Highest education level: Not on file  Occupational History   Not on file  Tobacco Use   Smoking status: Former    Packs/day: 1.00    Years: 5.00    Additional pack years: 0.00    Total pack years: 5.00    Types: Cigarettes    Quit date: 11/08/2021    Years since quitting: 1.1   Smokeless tobacco: Never  Vaping Use   Vaping Use: Never used  Substance and Sexual Activity   Alcohol use: No    Comment: occasionally   Drug use: No   Sexual activity: Yes    Birth control/protection: None  Other Topics Concern  Not on file  Social History Narrative   Not on file   Social Determinants of Health   Financial Resource Strain: High Risk (12/03/2021)   Overall Financial Resource Strain (CARDIA)    Difficulty of Paying Living Expenses: Very hard  Food Insecurity: No Food Insecurity (11/25/2022)   Hunger Vital Sign    Worried About Running Out of Food in the Last Year: Never true    Ran Out of Food in the Last Year: Never true  Transportation Needs: No Transportation Needs (11/25/2022)   PRAPARE - Administrator, Civil Service (Medical): No    Lack of Transportation (Non-Medical): No  Recent Concern: Transportation Needs - Unmet Transportation Needs (11/11/2022)   PRAPARE - Transportation    Lack of Transportation (Medical): Yes    Lack of Transportation (Non-Medical): Yes  Physical Activity: Sufficiently Active (06/17/2021)   Exercise Vital Sign    Days of Exercise per Week: 5 days    Minutes of Exercise per Session: 30 min  Stress: Stress Concern Present (06/17/2021)   [employer] of Occupational Health - Occupational Stress Questionnaire    Feeling of Stress : Rather much  Social Connections: Socially Isolated (06/17/2021)   Social Connection and  Isolation Panel [NHANES]    Frequency of Communication with Friends and Family: Twice a week    Frequency of Social Gatherings with Friends and Family: Never    Attends Religious Services: 1 to 4 times per year    Active Member of [employer] or Organizations: No    Attends Banker Meetings: Never    Marital Status: Divorced  Catering manager Violence: Not At Risk (11/25/2022)   Humiliation, Afraid, Rape, and Kick questionnaire    Fear of Current or Ex-Partner: No    Emotionally Abused: No    Physically Abused: No    Sexually Abused: No    Family History: Family History  Problem Relation Age of Onset   Heart disease Brother    Lung cancer Paternal Grandmother        lung   Cancer Paternal Grandfather    Asthma Daughter    Asthma Daughter     Current Medications:  Current Outpatient Medications:    dexamethasone (DECADRON) 4 MG tablet, Take 1 tablet (4 mg total) by mouth 2 (two) times daily with a meal., Disp: 60 tablet, Rfl: 0   docusate sodium (COLACE) 100 MG capsule, Take 1 capsule (100 mg total) by mouth 2 (two) times daily., Disp: , Rfl:    furosemide (LASIX) 20 MG tablet, Take 1 tablet (20 mg total) by mouth daily as needed., Disp: 30 tablet, Rfl: 0   oxyCODONE-acetaminophen (PERCOCET) 7.5-325 MG tablet, Take 1 tablet by mouth every 6 (six) hours as needed for severe pain., Disp: 56 tablet, Rfl: 0   pantoprazole (PROTONIX) 20 MG tablet, Take 1 tablet (20 mg total) by mouth daily., Disp: 30 tablet, Rfl: 5   prochlorperazine (COMPAZINE) 10 MG tablet, Take 1 tablet (10 mg total) by mouth every 6 (six) hours as needed for nausea or vomiting., Disp: 30 tablet, Rfl: 5   ALPRAZolam (XANAX) 0.5 MG tablet, TAKE 1 TABLET BY MOUTH AT BEDTIME AS NEEDED FOR ANXIETY, Disp: 30 tablet, Rfl: 0   ondansetron (ZOFRAN) 8 MG tablet, TAKE 1 TABLET BY MOUTH EVERY 8 HOURS AS NEEDED FOR NAUSEA FOR VOMITING, Disp: 30 tablet, Rfl: 5 No current facility-administered medications for this  visit.  Facility-Administered Medications Ordered in Other Visits:    fam-trastuzumab deruxtecan-nxki (ENHERTU) 300  mg in dextrose 5 % 100 mL chemo infusion, 5.2 mg/kg (Treatment Plan Recorded), Intravenous, Once, Doreatha Massed, MD   heparin lock flush 100 unit/mL, 500 Units, Intracatheter, Once PRN, Doreatha Massed, MD   sodium chloride flush (NS) 0.9 % injection 10 mL, 10 mL, Intracatheter, PRN, Doreatha Massed, MD   Allergies: Allergies  Allergen Reactions   Hydrocodone Itching, Nausea And Vomiting, Rash and Other (See Comments)    Hallucinations when given vicodin post crani in 2024   Sulfa Antibiotics Nausea And Vomiting   Morphine And Codeine Rash    REVIEW OF SYSTEMS:   Review of Systems  Constitutional:  Negative for chills, fatigue and fever.  HENT:   Negative for lump/mass, mouth sores, nosebleeds, sore throat and trouble swallowing.   Eyes:  Negative for eye problems.  Respiratory:  Positive for cough. Negative for shortness of breath.   Cardiovascular:  Negative for chest pain, leg swelling and palpitations.  Gastrointestinal:  Positive for nausea. Negative for abdominal pain, constipation, diarrhea and vomiting.  Genitourinary:  Negative for bladder incontinence, difficulty urinating, dysuria, frequency, hematuria and nocturia.   Musculoskeletal:  Positive for back pain. Negative for arthralgias, flank pain, myalgias and neck pain.  Skin:  Negative for itching and rash.  Neurological:  Positive for dizziness and headaches. Negative for numbness.  Hematological:  Does not bruise/bleed easily.  Psychiatric/Behavioral:  Negative for depression, sleep disturbance and suicidal ideas. The patient is not nervous/anxious.   All other systems reviewed and are negative.    VITALS:   Blood pressure 116/78, pulse 91, temperature 97.8 F (36.6 C), temperature source Oral, resp. rate 20, weight 127 lb 12.8 oz (58 kg), SpO2 95 %.  Wt Readings from Last 3  Encounters:  01/01/23 127 lb 12.8 oz (58 kg)  01/01/23 127 lb 12.8 oz (58 kg)  12/16/22 136 lb 9.6 oz (62 kg)    Body mass index is 21.94 kg/m.  Performance status (ECOG): 1 - Symptomatic but completely ambulatory  PHYSICAL EXAM:   Physical Exam Vitals and nursing note reviewed. Exam conducted with a chaperone present.  Constitutional:      Appearance: Normal appearance.  Cardiovascular:     Rate and Rhythm: Normal rate and regular rhythm.     Pulses: Normal pulses.     Heart sounds: Normal heart sounds.  Pulmonary:     Effort: Pulmonary effort is normal.     Breath sounds: Normal breath sounds.  Abdominal:     Palpations: Abdomen is soft. There is no hepatomegaly, splenomegaly or mass.     Tenderness: There is no abdominal tenderness.  Musculoskeletal:     Right lower leg: No edema.     Left lower leg: No edema.  Lymphadenopathy:     Cervical: No cervical adenopathy.     Right cervical: No superficial, deep or posterior cervical adenopathy.    Left cervical: No superficial, deep or posterior cervical adenopathy.     Upper Body:     Right upper body: No supraclavicular or axillary adenopathy.     Left upper body: No supraclavicular or axillary adenopathy.  Neurological:     General: No focal deficit present.     Mental Status: She is alert and oriented to person, place, and time.  Psychiatric:        Mood and Affect: Mood normal.        Behavior: Behavior normal.     LABS:      Latest Ref Rng & Units 01/01/2023   11:59  AM 12/16/2022    8:01 AM 11/16/2022    7:51 AM  CBC  WBC 4.0 - 10.5 K/uL 15.0  16.7  10.1   Hemoglobin 12.0 - 15.0 g/dL 16.1  9.6  09.6   Hematocrit 36.0 - 46.0 % 36.9  30.6  30.8   Platelets 150 - 400 K/uL 428  223  230       Latest Ref Rng & Units 01/01/2023   11:59 AM 12/16/2022    8:01 AM 11/16/2022    7:51 AM  CMP  Glucose 70 - 99 mg/dL 045  92  83   BUN 6 - 20 mg/dL 32  31  14   Creatinine 0.44 - 1.00 mg/dL 4.09  8.11  9.14   Sodium  135 - 145 mmol/L 131  132  131   Potassium 3.5 - 5.1 mmol/L 4.1  3.8  4.9   Chloride 98 - 111 mmol/L 93  100  93   CO2 22 - 32 mmol/L 27  23  28    Calcium 8.9 - 10.3 mg/dL 9.2  8.3  6.7   Total Protein 6.5 - 8.1 g/dL 6.7  6.3  5.8   Total Bilirubin 0.3 - 1.2 mg/dL 0.5  0.6  0.3   Alkaline Phos 38 - 126 U/L 151  42  39   AST 15 - 41 U/L 39  25  32   ALT 0 - 44 U/L 94  32  96      No results found for: "CEA1", "CEA" / No results found for: "CEA1", "CEA" No results found for: "PSA1" No results found for: "NWG956" No results found for: "CAN125"  No results found for: "TOTALPROTELP", "ALBUMINELP", "A1GS", "A2GS", "BETS", "BETA2SER", "GAMS", "MSPIKE", "SPEI" No results found for: "TIBC", "FERRITIN", "IRONPCTSAT" No results found for: "LDH"   STUDIES:   MR Brain W Wo Contrast  Result Date: 12/03/2022 CLINICAL DATA:  Brain metastases, assess treatment response 3T SRS Protocol for post op radiation treatment planning EXAM: MRI HEAD WITHOUT AND WITH CONTRAST TECHNIQUE: Multiplanar, multiecho pulse sequences of the brain and surrounding structures were obtained without and with intravenous contrast. CONTRAST:  5mL GADAVIST GADOBUTROL 1 MMOL/ML IV SOLN COMPARISON:  November 11, 2022. FINDINGS: Brain: Mild interval increase in size of a right parietal enhancing lesion, now measuring approximately 12 x 13 mm (previously 11 x 10 mm). Surrounding edema is mildly increased. New punctate focus a of enhancement in the right caudate, concerning for new metastasis. Prior right cerebellar metastatic lesion resection without nodular enhancement this site to suggest residual lesion. Mild decrease in size of the fluid collection in the posterior paraseptal soft tissues. Small amount of herniated cerebellar tissue through the defect, similar. No evidence of acute infarct, midline shift or hydrocephalus. Vascular: Major arterial flow voids are maintained at the skull base. Skull and upper cervical spine: Occipital  craniotomy. Otherwise, normal marrow signal. Sinuses/Orbits: Clear sinuses.  No acute orbital findings. IMPRESSION: 1. Mild interval increase in size of a right parietal enhancing lesion, now measuring approximately 12 x 13 mm (previously 11 x 10 mm). Mild increase in surrounding edema. 2. New punctate focus a of enhancement in the right caudate, concerning for new metastasis. 3. Prior right cerebellar metastatic lesion resection without evidence of residual lesion at this site. Herniation of a small left cerebellum through the craniotomy defect, not substantially changed. Electronically Signed   By: Feliberto Harts M.D.   On: 12/03/2022 16:09

## 2023-01-01 ENCOUNTER — Encounter: Payer: Self-pay | Admitting: Hematology

## 2023-01-01 ENCOUNTER — Inpatient Hospital Stay: Payer: Medicaid Other

## 2023-01-01 ENCOUNTER — Inpatient Hospital Stay (HOSPITAL_BASED_OUTPATIENT_CLINIC_OR_DEPARTMENT_OTHER): Payer: Medicaid Other | Admitting: Hematology

## 2023-01-01 VITALS — BP 116/78 | HR 91 | Temp 97.8°F | Resp 20 | Wt 127.8 lb

## 2023-01-01 VITALS — BP 121/79 | HR 86 | Temp 98.9°F | Resp 18

## 2023-01-01 DIAGNOSIS — C50911 Malignant neoplasm of unspecified site of right female breast: Secondary | ICD-10-CM

## 2023-01-01 DIAGNOSIS — Z170 Estrogen receptor positive status [ER+]: Secondary | ICD-10-CM | POA: Diagnosis not present

## 2023-01-01 DIAGNOSIS — C773 Secondary and unspecified malignant neoplasm of axilla and upper limb lymph nodes: Secondary | ICD-10-CM | POA: Insufficient documentation

## 2023-01-01 DIAGNOSIS — C7951 Secondary malignant neoplasm of bone: Secondary | ICD-10-CM | POA: Diagnosis not present

## 2023-01-01 DIAGNOSIS — C50811 Malignant neoplasm of overlapping sites of right female breast: Secondary | ICD-10-CM | POA: Diagnosis not present

## 2023-01-01 DIAGNOSIS — Z87891 Personal history of nicotine dependence: Secondary | ICD-10-CM | POA: Insufficient documentation

## 2023-01-01 DIAGNOSIS — C787 Secondary malignant neoplasm of liver and intrahepatic bile duct: Secondary | ICD-10-CM | POA: Insufficient documentation

## 2023-01-01 DIAGNOSIS — Z5112 Encounter for antineoplastic immunotherapy: Secondary | ICD-10-CM | POA: Diagnosis present

## 2023-01-01 DIAGNOSIS — Z5189 Encounter for other specified aftercare: Secondary | ICD-10-CM | POA: Insufficient documentation

## 2023-01-01 DIAGNOSIS — F1920 Other psychoactive substance dependence, uncomplicated: Secondary | ICD-10-CM | POA: Diagnosis not present

## 2023-01-01 LAB — CBC WITH DIFFERENTIAL/PLATELET
Abs Immature Granulocytes: 0.72 10*3/uL — ABNORMAL HIGH (ref 0.00–0.07)
Basophils Absolute: 0.1 10*3/uL (ref 0.0–0.1)
Basophils Relative: 1 %
Eosinophils Absolute: 0.1 10*3/uL (ref 0.0–0.5)
Eosinophils Relative: 0 %
HCT: 36.9 % (ref 36.0–46.0)
Hemoglobin: 11.9 g/dL — ABNORMAL LOW (ref 12.0–15.0)
Immature Granulocytes: 5 %
Lymphocytes Relative: 10 %
Lymphs Abs: 1.5 10*3/uL (ref 0.7–4.0)
MCH: 31.1 pg (ref 26.0–34.0)
MCHC: 32.2 g/dL (ref 30.0–36.0)
MCV: 96.3 fL (ref 80.0–100.0)
Monocytes Absolute: 1.2 10*3/uL — ABNORMAL HIGH (ref 0.1–1.0)
Monocytes Relative: 8 %
Neutro Abs: 11.5 10*3/uL — ABNORMAL HIGH (ref 1.7–7.7)
Neutrophils Relative %: 76 %
Platelets: 428 10*3/uL — ABNORMAL HIGH (ref 150–400)
RBC: 3.83 MIL/uL — ABNORMAL LOW (ref 3.87–5.11)
RDW: 13.4 % (ref 11.5–15.5)
WBC: 15 10*3/uL — ABNORMAL HIGH (ref 4.0–10.5)
nRBC: 0 % (ref 0.0–0.2)

## 2023-01-01 LAB — MAGNESIUM: Magnesium: 1.9 mg/dL (ref 1.7–2.4)

## 2023-01-01 LAB — COMPREHENSIVE METABOLIC PANEL
ALT: 94 U/L — ABNORMAL HIGH (ref 0–44)
AST: 39 U/L (ref 15–41)
Albumin: 3.3 g/dL — ABNORMAL LOW (ref 3.5–5.0)
Alkaline Phosphatase: 151 U/L — ABNORMAL HIGH (ref 38–126)
Anion gap: 11 (ref 5–15)
BUN: 32 mg/dL — ABNORMAL HIGH (ref 6–20)
CO2: 27 mmol/L (ref 22–32)
Calcium: 9.2 mg/dL (ref 8.9–10.3)
Chloride: 93 mmol/L — ABNORMAL LOW (ref 98–111)
Creatinine, Ser: 0.77 mg/dL (ref 0.44–1.00)
GFR, Estimated: >60 mL/min (ref >60)
Glucose, Bld: 118 mg/dL — ABNORMAL HIGH (ref 70–99)
Potassium: 4.1 mmol/L (ref 3.5–5.1)
Sodium: 131 mmol/L — ABNORMAL LOW (ref 135–145)
Total Bilirubin: 0.5 mg/dL (ref 0.3–1.2)
Total Protein: 6.7 g/dL (ref 6.5–8.1)

## 2023-01-01 LAB — RAPID URINE DRUG SCREEN, HOSP PERFORMED
Amphetamines: NOT DETECTED
Barbiturates: NOT DETECTED
Benzodiazepines: NOT DETECTED
Cocaine: NOT DETECTED
Opiates: NOT DETECTED
Tetrahydrocannabinol: NOT DETECTED

## 2023-01-01 LAB — PREGNANCY, URINE: Preg Test, Ur: NEGATIVE

## 2023-01-01 MED ORDER — DEXTROSE 5 % IV SOLN: Freq: Once | INTRAVENOUS | Status: AC

## 2023-01-01 MED ORDER — DIPHENHYDRAMINE HCL 25 MG PO CAPS: 50.0000 mg | ORAL_CAPSULE | Freq: Once | ORAL | Status: DC

## 2023-01-01 MED ORDER — HEPARIN SOD (PORK) LOCK FLUSH 100 UNIT/ML IV SOLN
500.0000 [IU] | Freq: Once | INTRAVENOUS | Status: AC | PRN
  Administered 2023-01-01: 500 [IU]

## 2023-01-01 MED ORDER — CETIRIZINE HCL 10 MG/ML IV SOLN
10.0000 mg | Freq: Once | INTRAVENOUS | Status: AC
  Administered 2023-01-01: 10 mg via INTRAVENOUS
  Filled 2023-01-01: qty 1

## 2023-01-01 MED ORDER — SODIUM CHLORIDE 0.9 % IV SOLN
10.0000 mg | Freq: Once | INTRAVENOUS | Status: AC
  Administered 2023-01-01: 10 mg via INTRAVENOUS
  Filled 2023-01-01: qty 10

## 2023-01-01 MED ORDER — ACETAMINOPHEN 325 MG PO TABS
650.0000 mg | ORAL_TABLET | Freq: Once | ORAL | Status: AC
  Administered 2023-01-01: 650 mg via ORAL
  Filled 2023-01-01: qty 2

## 2023-01-01 MED ORDER — SODIUM CHLORIDE 0.9% FLUSH
10.0000 mL | INTRAVENOUS | Status: DC | PRN
  Administered 2023-01-01: 10 mL

## 2023-01-01 MED ORDER — SODIUM CHLORIDE 0.9 % IV SOLN
150.0000 mg | Freq: Once | INTRAVENOUS | Status: AC
  Administered 2023-01-01: 150 mg via INTRAVENOUS
  Filled 2023-01-01: qty 5

## 2023-01-01 MED ORDER — PALONOSETRON HCL INJECTION 0.25 MG/5ML
0.2500 mg | Freq: Once | INTRAVENOUS | Status: AC
  Administered 2023-01-01: 0.25 mg via INTRAVENOUS
  Filled 2023-01-01: qty 5

## 2023-01-01 MED ORDER — FAM-TRASTUZUMAB DERUXTECAN-NXKI CHEMO 100 MG IV SOLR
5.2000 mg/kg | Freq: Once | INTRAVENOUS | Status: AC
  Administered 2023-01-01: 300 mg via INTRAVENOUS
  Filled 2023-01-01: qty 15

## 2023-01-01 MED ORDER — ALTEPLASE 2 MG IJ SOLR
2.0000 mg | Freq: Once | INTRAMUSCULAR | Status: AC
  Administered 2023-01-01: 2 mg
  Filled 2023-01-01: qty 2

## 2023-01-01 NOTE — Patient Instructions (Signed)
[facility]   Discharge Instructions: Thank you for choosing [facility] to provide your oncology and hematology care.  If you have a lab appointment with the Cancer Center - please note that after April 8th, 2024, all labs will be drawn in the cancer center.  You do not have to check in or register with the main entrance as you have in the past but will complete your check-in in the cancer center.  Wear comfortable clothing and clothing appropriate for easy access to any Portacath or PICC line.   We strive to give you quality time with your provider. You may need to reschedule your appointment if you arrive late (15 or more minutes).  Arriving late affects you and other patients whose appointments are after yours.  Also, if you miss three or more appointments without notifying the office, you may be dismissed from the clinic at the provider's discretion.      For prescription refill requests, have your pharmacy contact our office and allow 72 hours for refills to be completed.    Today you received the following chemotherapy and/or immunotherapy agents Enhertu   To help prevent nausea and vomiting after your treatment, we encourage you to take your nausea medication as directed.  Fam-Trastuzumab Deruxtecan Injection What is this medication? FAM-TRASTUZUMAB DERUXTECAN (fam-tras TOOZ eu mab DER ux TEE kan) treats some types of cancer. It works by blocking a protein that causes cancer cells to grow and multiply. This helps to slow or stop the spread of cancer cells. This medicine may be used for other purposes; ask your health care provider or pharmacist if you have questions. COMMON BRAND NAME(S): ENHERTU What should I tell my care team before I take this medication? They need to know if you have any of these conditions: Heart disease Heart failure Infection, especially a viral infection, such as chickenpox, cold sores, or herpes Liver disease Lung or  breathing disease, such as asthma or COPD An unusual or allergic reaction to fam-trastuzumab deruxtecan, other medications, foods, dyes, or preservatives Pregnant or trying to get pregnant Breast-feeding How should I use this medication? This medication is injected into a vein. It is given by your care team in a hospital or clinic setting. A special MedGuide will be given to you before each treatment. Be sure to read this information carefully each time. Talk to your care team about the use of this medication in children. Special care may be needed. Overdosage: If you think you have taken too much of this medicine contact a poison control center or emergency room at once. NOTE: This medicine is only for you. Do not share this medicine with others. What if I miss a dose? It is important not to miss your dose. Call your care team if you are unable to keep an appointment. What may interact with this medication? Interactions are not expected. This list may not describe all possible interactions. Give your health care provider a list of all the medicines, herbs, non-prescription drugs, or dietary supplements you use. Also tell them if you smoke, drink alcohol, or use illegal drugs. Some items may interact with your medicine. What should I watch for while using this medication? Visit your care team for regular checks on your progress. Tell your care team if your symptoms do not start to get better or if they get worse. Your condition will be monitored carefully while you are receiving this medication. Do not become pregnant while taking this medication   or for 7 months after stopping it. Women should inform their care team if they wish to become pregnant or think they might be pregnant. Men should not father a child while taking this medication and for 4 months after stopping it. There is potential for serious side effects to an unborn child. Talk to your care team for more information. Do not  breast-feed an infant while taking this medication or for 7 months after the last dose. This medication has caused decreased sperm counts in some men. This may make it more difficult to father a child. Talk to your care team if you are concerned about your fertility. This medication may increase your risk to bruise or bleed. Call your care team if you notice any unusual bleeding. Be careful brushing or flossing your teeth or using a toothpick because you may get an infection or bleed more easily. If you have any dental work done, tell your dentist you are receiving this medication. This medication may cause dry eyes and blurred vision. If you wear contact lenses, you may feel some discomfort. Lubricating eye drops may help. See your care team if the problem does not go away or is severe. This medication may increase your risk of getting an infection. Call your care team for advice if you get a fever, chills, sore throat, or other symptoms of a cold or flu. Do not treat yourself. Try to avoid being around people who are sick. Avoid taking medications that contain aspirin, acetaminophen, ibuprofen, naproxen, or ketoprofen unless instructed by your care team. These medications may hide a fever. What side effects may I notice from receiving this medication? Side effects that you should report to your care team as soon as possible: Allergic reactions--skin rash, itching, hives, swelling of the face, lips, tongue, or throat Dry cough, shortness of breath or trouble breathing Infection--fever, chills, cough, sore throat, wounds that don't heal, pain or trouble when passing urine, general feeling of discomfort or being unwell Heart failure--shortness of breath, swelling of the ankles, feet, or hands, sudden weight gain, unusual weakness or fatigue Unusual bruising or bleeding Side effects that usually do not require medical attention (report these to your care team if they continue or are  bothersome): Constipation Diarrhea Hair loss Muscle pain Nausea Vomiting This list may not describe all possible side effects. Call your doctor for medical advice about side effects. You may report side effects to FDA at 1-800-FDA-1088. Where should I keep my medication? This medication is given in a hospital or clinic. It will not be stored at home. NOTE: This sheet is a summary. It may not cover all possible information. If you have questions about this medicine, talk to your doctor, pharmacist, or health care provider.  2023 Elsevier/Gold Standard (2021-04-17 00:00:00)   BELOW ARE SYMPTOMS THAT SHOULD BE REPORTED IMMEDIATELY: *FEVER GREATER THAN 100.4 F (38 C) OR HIGHER *CHILLS OR SWEATING *NAUSEA AND VOMITING THAT IS NOT CONTROLLED WITH YOUR NAUSEA MEDICATION *UNUSUAL SHORTNESS OF BREATH *UNUSUAL BRUISING OR BLEEDING *URINARY PROBLEMS (pain or burning when urinating, or frequent urination) *BOWEL PROBLEMS (unusual diarrhea, constipation, pain near the anus) TENDERNESS IN MOUTH AND THROAT WITH OR WITHOUT PRESENCE OF ULCERS (sore throat, sores in mouth, or a toothache) UNUSUAL RASH, SWELLING OR PAIN  UNUSUAL VAGINAL DISCHARGE OR ITCHING   Items with * indicate a potential emergency and should be followed up as soon as possible or go to the Emergency Department if any problems should occur.  Please show the CHEMOTHERAPY   ALERT CARD or IMMUNOTHERAPY ALERT CARD at check-in to the Emergency Department and triage nurse.  Should you have questions after your visit or need to cancel or reschedule your appointment, please contact [facility] [place] [phone]  and follow the prompts.  Office hours are 8:00 a.m. to 4:30 p.m. Monday - Friday. Please note that voicemails left after 4:00 p.m. may not be returned until the following business day.  We are closed weekends and major holidays. You have access to a nurse at all times for urgent questions. Please call the main number  to the clinic [phone] and follow the prompts.  For any non-urgent questions, you may also contact your provider using MyChart. We now offer e-Visits for anyone 18 and older to request care online for non-urgent symptoms. For details visit [email or web address].   Also download the MyChart app! Go to the app store, search "MyChart", open the app, select [facility], and log in with your MyChart username and password.   

## 2023-01-01 NOTE — Progress Notes (Signed)
Patient presents today for D1C1 Enhertu infusion. Patient is in satisfactory condition with no new complaints voiced.  Vital signs are stable.  Labs reviewed by Dr. Ellin Saba during the office visit and all labs are within treatment parameters. Okay use ECHO from 10/29/21 for treatment today per Dr.K. ECHO is scheduled for 01/13/23. We will proceed with treatment per MD orders.   C1D1 Enhertu given today per MD orders. Tolerated infusion without adverse affects. Vital signs stable. No complaints at this time. Discharged from clinic ambulatory in stable condition. Alert and oriented x 3. F/U with [facility] as scheduled.

## 2023-01-01 NOTE — Progress Notes (Signed)
Pharmacist Chemotherapy Monitoring - Initial Assessment    Anticipated start date: 01/01/23   The following has been reviewed per standard work regarding the patient's treatment regimen: The patient's diagnosis, treatment plan and drug doses, and organ/hematologic function Lab orders and baseline tests specific to treatment regimen  The treatment plan start date, drug sequencing, and pre-medications Prior authorization status  Patient's documented medication list, including drug-drug interaction screen and prescriptions for anti-emetics and supportive care specific to the treatment regimen The drug concentrations, fluid compatibility, administration routes, and timing of the medications to be used The patient's access for treatment and lifetime cumulative dose history, if applicable  The patient's medication allergies and previous infusion related reactions, if applicable   Changes made to treatment plan:  pre-medications - change diphenhydramine to cetirizine 10 mg ivpush x 1  Follow up needed:  N/A  Discontinue diphenhydramine from oncology treatment plan --> Add Quzyttir (cetirizine) 10 mg IVPush x 1 as premedication for oncology treatment plan.    Stephens Shire, [facility], 01/01/2023  1:55 PM

## 2023-01-01 NOTE — Patient Instructions (Signed)
[facility] Discharge Instructions   You were seen and examined today by Dr. Katragadda.  He reviewed the results of your lab work which are normal/stable.   We will proceed with your treatment today.  Return as scheduled.    Thank you for choosing [facility] at [facility] to provide your oncology and hematology care.  To afford each patient quality time with our provider, please arrive at least 15 minutes before your scheduled appointment time.   If you have a lab appointment with the Cancer Center please come in thru the Main Entrance and check in at the main information desk.  You need to re-schedule your appointment should you arrive 10 or more minutes late.  We strive to give you quality time with our providers, and arriving late affects you and other patients whose appointments are after yours.  Also, if you no show three or more times for appointments you may be dismissed from the clinic at the providers discretion.     Again, thank you for choosing [facility].  Our hope is that these requests will decrease the amount of time that you wait before being seen by our physicians.       _____________________________________________________________  Should you have questions after your visit to [facility], please contact our office at [phone] and follow the prompts.  Our office hours are 8:00 a.m. and 4:30 p.m. Monday - Friday.  Please note that voicemails left after 4:00 p.m. may not be returned until the following business day.  We are closed weekends and major holidays.  You do have access to a nurse 24-7, just call the main number to the clinic [phone] and do not press any options, hold on the line and a nurse will answer the phone.    For prescription refill requests, have your pharmacy contact our office and allow 72 hours.    Due to Covid, you will need to wear a mask upon entering  the hospital. If you do not have a mask, a mask will be given to you at the Main Entrance upon arrival. For doctor visits, patients may have 1 support person age 18 or older with them. For treatment visits, patients can not have anyone with them due to social distancing guidelines and our immunocompromised population.      

## 2023-01-01 NOTE — Progress Notes (Signed)
OK to treat with ECHO from 10/29/2021  with LVEF 60-65%  Repeat ECHO has been ordered for ASAP test.  T.O. Dr Carilyn Goodpasture, PharmD

## 2023-01-01 NOTE — Progress Notes (Signed)
Patient has been examined by Dr. Katragadda. Vital signs and labs have been reviewed by MD - ANC, Creatinine, LFTs, hemoglobin, and platelets are within treatment parameters per M.D. - pt may proceed with treatment.  Primary RN and pharmacy notified.  

## 2023-01-02 ENCOUNTER — Other Ambulatory Visit: Payer: Self-pay

## 2023-01-02 MED ORDER — OXYCODONE-ACETAMINOPHEN 7.5-325 MG PO TABS: 1.0000 | ORAL_TABLET | Freq: Four times a day (QID) | ORAL | 0 refills | Status: DC | PRN

## 2023-01-02 NOTE — Progress Notes (Signed)
24 POST CHEMOTHERAPY CALL BACK:   No answer.  Left message for patient to return call.

## 2023-01-05 ENCOUNTER — Other Ambulatory Visit: Payer: Self-pay

## 2023-01-06 ENCOUNTER — Other Ambulatory Visit: Payer: Self-pay

## 2023-01-08 ENCOUNTER — Other Ambulatory Visit: Payer: Self-pay | Admitting: Radiation Therapy

## 2023-01-08 ENCOUNTER — Encounter (HOSPITAL_COMMUNITY): Admission: RE | Admit: 2023-01-08 | Payer: Medicaid Other | Source: Ambulatory Visit

## 2023-01-08 DIAGNOSIS — C7931 Secondary malignant neoplasm of brain: Secondary | ICD-10-CM

## 2023-01-09 ENCOUNTER — Other Ambulatory Visit: Payer: Self-pay

## 2023-01-13 ENCOUNTER — Ambulatory Visit (HOSPITAL_COMMUNITY): Admission: RE | Admit: 2023-01-13 | Payer: Medicaid Other | Source: Ambulatory Visit

## 2023-01-14 ENCOUNTER — Other Ambulatory Visit: Payer: Self-pay | Admitting: Urology

## 2023-01-14 ENCOUNTER — Encounter: Payer: Self-pay | Admitting: Urology

## 2023-01-14 NOTE — Progress Notes (Signed)
Received a request for refill of dexamethasone from patient's pharmacy but Sara [name], RT-RT was able to confirm with her that she is no longer taking this medication. She was able to taper off without difficulty and has been off all steroids for the past week without issue.  Marguarite Arbour, MMS, PA-C [facility] Radiation Oncology Physician Assistant Direct Dial: [phone]  Fax: [phone]

## 2023-01-15 ENCOUNTER — Ambulatory Visit (HOSPITAL_COMMUNITY)
Admission: RE | Admit: 2023-01-15 | Discharge: 2023-01-15 | Disposition: A | Discharge status: Home / Self Care | Payer: Medicaid Other | Source: Ambulatory Visit | Attending: Hematology | Admitting: Hematology

## 2023-01-15 DIAGNOSIS — C50911 Malignant neoplasm of unspecified site of right female breast: Secondary | ICD-10-CM | POA: Diagnosis not present

## 2023-01-15 DIAGNOSIS — C50111 Malignant neoplasm of central portion of right female breast: Secondary | ICD-10-CM | POA: Diagnosis not present

## 2023-01-15 DIAGNOSIS — Z0189 Encounter for other specified special examinations: Secondary | ICD-10-CM | POA: Diagnosis not present

## 2023-01-15 DIAGNOSIS — Z170 Estrogen receptor positive status [ER+]: Secondary | ICD-10-CM | POA: Insufficient documentation

## 2023-01-15 LAB — ECHOCARDIOGRAM COMPLETE
AR max vel: 2.33 cm2
AV Area VTI: 2.16 cm2
AV Area mean vel: 2.5 cm2
AV Mean grad: 5 mmHg
AV Peak grad: 10.4 mmHg
Ao pk vel: 1.62 m/s
S' Lateral: 2.3 cm

## 2023-01-15 NOTE — Progress Notes (Signed)
  Echocardiogram 2D Echocardiogram has been performed.  Sara [name] 01/15/2023, 3:58 PM

## 2023-01-20 ENCOUNTER — Ambulatory Visit
Admission: RE | Admit: 2023-01-20 | Discharge: 2023-01-20 | Disposition: A | Discharge status: Home / Self Care | Payer: Medicaid Other | Source: Ambulatory Visit | Attending: Radiation Oncology | Admitting: Radiation Oncology

## 2023-01-20 NOTE — Progress Notes (Signed)
  Radiation Oncology         [phone]) [phone] ________________________________  Name: MURPHIE LACHICA MRN: [id number]  Date of Service: 01/20/2023  DOB: 1976-07-23  Post Treatment Telephone Note  Diagnosis:  47 yo woman with brain metastases secondary to metastatic breast cancer. (as documented in provider EOT note)  The patient was not available for call today.   The patient was encouraged to avoid sun exposure in the area of prior treatment for up to one year following radiation with either sunscreen or by the style of clothing worn in the sun.  The patient has scheduled follow up with her neuro surgeon Dr. Wynetta Emery for ongoing surveillance, and was encouraged to call if she develops concerns or questions regarding radiation.   Ruel Favors, LPN

## 2023-01-21 NOTE — Progress Notes (Signed)
[facility] [address], [place] [address]    Clinic Day:  01/22/2023  Referring physician: Toma Deiters, MD  Patient Care Team: Toma Deiters, MD as PCP - General (Internal Medicine) Doreatha Massed, MD as Medical Oncologist (Medical Oncology) Therese Sarah, RN as Oncology Nurse Navigator (Oncology)   ASSESSMENT & PLAN:   Assessment: 1. Her2+ metastatic breast cancer: - She reports feeling a knot in the right breast in July 2022.  She felt the same lump in September which has increased slightly in November.  In the last 3 months, it has increased in size from a quarter to size of an orange. - Mammogram/ultrasound on 10/01/2021: Large irregular mass involving the entire central right breast measuring 4.9 x 2.4 x 4 cm at 12:00.  At least 3 lymph nodes in the right axilla.  Overlying skin thickening and generalized erythema and marked tenderness. - Right breast mass and axillary lymph node biopsy on 10/08/2021: - Pathology: Poorly differentiated ductal carcinoma, grade 3, HER2 3+ positive, ER 5% strong staining intensity, PR negative, Ki-67 25%. - PET scan (11/30/2021): Hypermetabolic tumor involving right breast, metastatic mediastinal and hilar lymphadenopathy, hepatic metastatic disease, diffuse lytic bone lesions. - 4 cycles of THP from 12/03/2021 through 02/25/2022 - Cerebellar mass resection (11/12/2022): Metastatic carcinoma with tumor cells positive for CK7, GATA3, indicating breast primary.  ER and CK20 are negative.  HER2 is 3+. - Enhertu started on 01/01/2023   2. Social/family history: - She is currently living with her father, youngest daughter and sister.  She has 2 daughters ages 23 and 65.  She has worked in Journalist, newspaper previously.  She quit smoking 3 months ago.  Prior to that she smoked a pack a day for 4 to 5 years and less than half pack a day since 2011.  She vapes occasionally at this time. - Paternal great grandmother had  lung cancer.  Paternal aunt had lung cancer at age 68.  Paternal grandmother had breast cancer in her 33s.  Paternal grandfather had colon cancer.  Maternal grandfather had colon cancer.    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 - She received first cycle of Enhertu on 01/01/2023. - She reported nausea with dry heaves and diarrhea for 1 week.  Diarrhea was 4-5 times per day.  Compazine did not work for nausea. - Recommend that she fill Zofran which was already prescribed. - Recommend use Imodium for diarrhea which may happen again after today. - Reviewed labs today: Alk phos elevated at 159, rest of LFTs are normal.  Albumin low at 2.2.  CBC shows anemia with hemoglobin 9.9. - Recommend proceeding with cycle 2 today.  RTC 3 weeks for follow-up.  2.  Brain metastasis: - Dexamethasone has been tapered off.  3.  Anxiety: - Continue Xanax 0.5 mg at bedtime as needed. - She reported that her dad told her that she is falling asleep most of the time.  She asked me to increase Xanax to twice a day.  I am not comfortable at increasing it because of drowsiness.  4.  Chest pain, back pain, right ankle pain: - Continue Percocet 7.5/325 every 6 hours as needed. - Continue prochlorperazine for nausea.  5.  High risk drug monitoring: - 2D echo on 01/15/2023: EF 60 to 65%.    No orders of the defined types were placed in this encounter.  I,Katie Daubenspeck,acting as a Neurosurgeon for Doreatha Massed, MD.,have documented all relevant documentation on the behalf of Doreatha Massed, MD,as directed by  Doreatha Massed, MD while in the presence of Doreatha Massed, MD.   I, Doreatha Massed MD, have reviewed the above documentation for accuracy and completeness, and I agree with the above.   Doreatha Massed, MD   6/6/20246:06 PM  CHIEF COMPLAINT:   Diagnosis: Her2+ metastatic right breast cancer     Cancer Staging  HER2-positive carcinoma of right breast [facility]) Staging form: Breast, AJCC 8th Edition - Clinical stage from 10/28/2021: Stage IV (cT4d, cN1, cM1, G3, ER+, PR-, HER2+) - Unsigned    Prior Therapy: DOCEtaxel + Trastuzumab + Pertuzumab (THP) q21d x 4 cycles   Current Therapy:  Enhertu    HISTORY OF PRESENT ILLNESS:   Oncology History  HER2-positive carcinoma of right breast (HCC)  10/28/2021 Initial Diagnosis   Breast cancer, right breast (HCC)   12/03/2021 - 02/25/2022 Chemotherapy   Patient is on Treatment Plan : BREAST DOCEtaxel + Trastuzumab + Pertuzumab (THP) q21d x 8 cycles / Trastuzumab + Pertuzumab q21d x 4 cycles     01/01/2023 -  Chemotherapy   Patient is on Treatment Plan : BREAST METASTATIC Fam-Trastuzumab Deruxtecan-nxki (Enhertu) (5.4) q21d        INTERVAL HISTORY:   Sara [name] is a 47 y.o. female presenting to clinic today for follow up of Her2+ metastatic right breast cancer. She was last seen by me on 01/01/23.  Today, she states that she is doing well overall. Her appetite level is at 70%. Her energy level is at 70%.  PAST MEDICAL HISTORY:   Past Medical History: Past Medical History:  Diagnosis Date   Anxiety    Back pain    Cancer [facility])     Surgical History: Past Surgical History:  Procedure Laterality Date   CHOLECYSTECTOMY     ORTHOPEDIC SURGERY     PORTACATH PLACEMENT Left 11/18/2021   Procedure: INSERTION PORT-A-CATH;  Surgeon: Lucretia Roers, MD;  Location: [facility];  Service: General;  Laterality: Left;   SUBOCCIPITAL CRANIECTOMY CERVICAL LAMINECTOMY N/A 11/12/2022   Procedure: SUBOCCIPITAL CRANIECTOMY FOR RESECTION OF CEREBELLAR MASS;  Surgeon: Donalee Citrin, MD;  Location: [facility] OR;  Service: Neurosurgery;  Laterality: N/A;    Social History: Social History   Socioeconomic History   Marital status: Legally Separated    Spouse name: Not on file   Number of children: Not on file   Years of education: Not on file   Highest education  level: Not on file  Occupational History   Not on file  Tobacco Use   Smoking status: Former    Packs/day: 1.00    Years: 5.00    Additional pack years: 0.00    Total pack years: 5.00    Types: Cigarettes    Quit date: 11/08/2021    Years since quitting: 1.2   Smokeless tobacco: Never  Vaping Use   Vaping Use: Never used  Substance and Sexual Activity   Alcohol use: No    Comment: occasionally   Drug use: No   Sexual activity: Yes    Birth control/protection: None  Other Topics Concern   Not on file  Social History Narrative   Not on file   Social Determinants of Health   Financial Resource Strain: High Risk (12/03/2021)   Overall Financial Resource Strain (CARDIA)    Difficulty of Paying Living Expenses: Very hard  Food Insecurity: No Food Insecurity (11/25/2022)   Hunger  Vital Sign    Worried About Programme researcher, broadcasting/film/video in the Last Year: Never true    Ran Out of Food in the Last Year: Never true  Transportation Needs: No Transportation Needs (11/25/2022)   PRAPARE - Administrator, Civil Service (Medical): No    Lack of Transportation (Non-Medical): No  Recent Concern: Transportation Needs - Unmet Transportation Needs (11/11/2022)   PRAPARE - Transportation    Lack of Transportation (Medical): Yes    Lack of Transportation (Non-Medical): Yes  Physical Activity: Sufficiently Active (06/17/2021)   Exercise Vital Sign    Days of Exercise per Week: 5 days    Minutes of Exercise per Session: 30 min  Stress: Stress Concern Present (06/17/2021)   [employer] of Occupational Health - Occupational Stress Questionnaire    Feeling of Stress : Rather much  Social Connections: Socially Isolated (06/17/2021)   Social Connection and Isolation Panel [NHANES]    Frequency of Communication with Friends and Family: Twice a week    Frequency of Social Gatherings with Friends and Family: Never    Attends Religious Services: 1 to 4 times per year    Active Member of [employer]  or Organizations: No    Attends Banker Meetings: Never    Marital Status: Divorced  Catering manager Violence: Not At Risk (11/25/2022)   Humiliation, Afraid, Rape, and Kick questionnaire    Fear of Current or Ex-Partner: No    Emotionally Abused: No    Physically Abused: No    Sexually Abused: No    Family History: Family History  Problem Relation Age of Onset   Heart disease Brother    Lung cancer Paternal Grandmother        lung   Cancer Paternal Grandfather    Asthma Daughter    Asthma Daughter     Current Medications:  Current Outpatient Medications:    docusate sodium (COLACE) 100 MG capsule, Take 1 capsule (100 mg total) by mouth 2 (two) times daily., Disp: , Rfl:    furosemide (LASIX) 20 MG tablet, Take 1 tablet (20 mg total) by mouth daily as needed., Disp: 30 tablet, Rfl: 0   ondansetron (ZOFRAN) 8 MG tablet, TAKE 1 TABLET BY MOUTH EVERY 8 HOURS AS NEEDED FOR NAUSEA FOR VOMITING, Disp: 30 tablet, Rfl: 5   pantoprazole (PROTONIX) 20 MG tablet, Take 1 tablet (20 mg total) by mouth daily., Disp: 30 tablet, Rfl: 5   ALPRAZolam (XANAX) 0.5 MG tablet, TAKE 1 TABLET BY MOUTH AT BEDTIME AS NEEDED FOR ANXIETY, Disp: 30 tablet, Rfl: 0   oxyCODONE-acetaminophen (PERCOCET) 7.5-325 MG tablet, Take 1 tablet by mouth every 6 (six) hours as needed for severe pain., Disp: 84 tablet, Rfl: 0   prochlorperazine (COMPAZINE) 10 MG tablet, Take 1 tablet (10 mg total) by mouth every 6 (six) hours as needed for nausea or vomiting. (Patient not taking: Reported on 01/22/2023), Disp: 30 tablet, Rfl: 5 No current facility-administered medications for this visit.  Facility-Administered Medications Ordered in Other Visits:    sodium chloride flush (NS) 0.9 % injection 10 mL, 10 mL, Intracatheter, PRN, Doreatha Massed, MD, 10 mL at 01/22/23 1229   Allergies: Allergies  Allergen Reactions   Hydrocodone Itching, Nausea And Vomiting, Rash and Other (See Comments)    Hallucinations  when given vicodin post crani in 2024   Sulfa Antibiotics Nausea And Vomiting   Morphine And Codeine Rash    REVIEW OF SYSTEMS:   Review of Systems  Constitutional:  Negative for chills, fatigue and fever.  HENT:   Negative for lump/mass, mouth sores, nosebleeds, sore throat and trouble swallowing.   Eyes:  Negative for eye problems.  Respiratory:  Positive for cough. Negative for shortness of breath.   Cardiovascular:  Positive for chest pain. Negative for leg swelling and palpitations.  Gastrointestinal:  Positive for diarrhea, nausea and vomiting. Negative for abdominal pain and constipation.  Genitourinary:  Negative for bladder incontinence, difficulty urinating, dysuria, frequency, hematuria and nocturia.   Musculoskeletal:  Negative for arthralgias, back pain, flank pain, myalgias and neck pain.  Skin:  Negative for itching and rash.  Neurological:  Positive for dizziness and headaches. Negative for numbness.  Hematological:  Does not bruise/bleed easily.  Psychiatric/Behavioral:  Positive for sleep disturbance. Negative for depression and suicidal ideas. The patient is not nervous/anxious.   All other systems reviewed and are negative.    VITALS:   Blood pressure 106/68, pulse 85, temperature 98.3 F (36.8 C), temperature source Tympanic, resp. rate 18, SpO2 93 %.  Wt Readings from Last 3 Encounters:  01/22/23 129 lb 9.6 oz (58.8 kg)  01/01/23 127 lb 12.8 oz (58 kg)  01/01/23 127 lb 12.8 oz (58 kg)    There is no height or weight on file to calculate BMI.  Performance status (ECOG): 1 - Symptomatic but completely ambulatory  PHYSICAL EXAM:   Physical Exam Vitals and nursing note reviewed. Exam conducted with a chaperone present.  Constitutional:      Appearance: Normal appearance.  Cardiovascular:     Rate and Rhythm: Normal rate and regular rhythm.     Pulses: Normal pulses.     Heart sounds: Normal heart sounds.  Pulmonary:     Effort: Pulmonary effort is  normal.     Breath sounds: Normal breath sounds.  Abdominal:     Palpations: Abdomen is soft. There is no hepatomegaly, splenomegaly or mass.     Tenderness: There is no abdominal tenderness.  Musculoskeletal:     Right lower leg: No edema.     Left lower leg: No edema.  Lymphadenopathy:     Cervical: No cervical adenopathy.     Right cervical: No superficial, deep or posterior cervical adenopathy.    Left cervical: No superficial, deep or posterior cervical adenopathy.     Upper Body:     Right upper body: No supraclavicular or axillary adenopathy.     Left upper body: No supraclavicular or axillary adenopathy.  Neurological:     General: No focal deficit present.     Mental Status: She is alert and oriented to person, place, and time.  Psychiatric:        Mood and Affect: Mood normal.        Behavior: Behavior normal.     LABS:      Latest Ref Rng & Units 01/22/2023    8:46 AM 01/01/2023   11:59 AM 12/16/2022    8:01 AM  CBC  WBC 4.0 - 10.5 K/uL 8.4  15.0  16.7   Hemoglobin 12.0 - 15.0 g/dL 9.9  16.1  9.6   Hematocrit 36.0 - 46.0 % 29.9  36.9  30.6   Platelets 150 - 400 K/uL 393  428  223       Latest Ref Rng & Units 01/22/2023    8:46 AM 01/01/2023   11:59 AM 12/16/2022    8:01 AM  CMP  Glucose 70 - 99 mg/dL 096  045  92   BUN 6 -  20 mg/dL 14  32  31   Creatinine 0.44 - 1.00 mg/dL 1.61  0.96  0.45   Sodium 135 - 145 mmol/L 131  131  132   Potassium 3.5 - 5.1 mmol/L 3.1  4.1  3.8   Chloride 98 - 111 mmol/L 98  93  100   CO2 22 - 32 mmol/L 23  27  23    Calcium 8.9 - 10.3 mg/dL 8.1  9.2  8.3   Total Protein 6.5 - 8.1 g/dL 6.1  6.7  6.3   Total Bilirubin 0.3 - 1.2 mg/dL 0.4  0.5  0.6   Alkaline Phos 38 - 126 U/L 159  151  42   AST 15 - 41 U/L 23  39  25   ALT 0 - 44 U/L 26  94  32      No results found for: "CEA1", "CEA" / No results found for: "CEA1", "CEA" No results found for: "PSA1" No results found for: "WUJ811" No results found for: "CAN125"  No results  found for: "TOTALPROTELP", "ALBUMINELP", "A1GS", "A2GS", "BETS", "BETA2SER", "GAMS", "MSPIKE", "SPEI" No results found for: "TIBC", "FERRITIN", "IRONPCTSAT" No results found for: "LDH"   STUDIES:   ECHOCARDIOGRAM COMPLETE  Result Date: 01/15/2023    ECHOCARDIOGRAM REPORT   Patient Name:   Sara [name] Date of Exam: 01/15/2023 Medical Rec #:  [id number]       Height:       64.0 in Accession #:    [id number]      Weight:       127.8 lb Date of Birth:  1975-09-26       BSA:          1.617 m Patient Age:    47 years        BP:           96/64 mmHg Patient Gender: F               HR:           87 bpm. [facility] Procedure: 2D Echo, 3D Echo, Color Doppler, Cardiac Doppler and Strain Analysis Indications:    Chemo  History:        Patient has prior history of Echocardiogram examinations, most                 recent 10/29/2021. Risk Factors:Former Smoker. CA.  Sonographer:    Milda Smart Referring Phys: [id number] Doreatha Massed  Sonographer Comments: Global longitudinal strain was attempted. IMPRESSIONS  1. Left ventricular ejection fraction, by estimation, is 60 to 65%. The left ventricle has normal function. The left ventricle has no regional wall motion abnormalities. Left ventricular diastolic parameters were normal. The average left ventricular global longitudinal strain is -19.0 %. The global longitudinal strain is normal.  2. Right ventricular systolic function is normal. The right ventricular size is normal. Tricuspid regurgitation signal is inadequate for assessing PA pressure.  3. The mitral valve is normal in structure. No evidence of mitral valve regurgitation. No evidence of mitral stenosis.  4. The tricuspid valve is abnormal.  5. The aortic valve is tricuspid. Aortic valve regurgitation is not visualized. No aortic stenosis is present.  6. The inferior vena cava is normal in size with greater than 50% respiratory variability, suggesting right atrial pressure of 3 mmHg. FINDINGS   Left Ventricle: Left ventricular ejection fraction, by estimation, is 60 to 65%. The left ventricle has normal function. The left ventricle has no regional  wall motion abnormalities. The average left ventricular global longitudinal strain is -19.0 %. The global longitudinal strain is normal. The left ventricular internal cavity size was normal in size. There is no left ventricular hypertrophy. Left ventricular diastolic parameters were normal. Right Ventricle: The right ventricular size is normal. Right vetricular wall thickness was not well visualized. Right ventricular systolic function is normal. Tricuspid regurgitation signal is inadequate for assessing PA pressure. Left Atrium: Left atrial size was normal in size. Right Atrium: Right atrial size was normal in size. Pericardium: There is no evidence of pericardial effusion. Mitral Valve: The mitral valve is normal in structure. No evidence of mitral valve regurgitation. No evidence of mitral valve stenosis. Tricuspid Valve: The tricuspid valve is abnormal. Tricuspid valve regurgitation is mild . No evidence of tricuspid stenosis. Aortic Valve: The aortic valve is tricuspid. Aortic valve regurgitation is not visualized. No aortic stenosis is present. Aortic valve mean gradient measures 5.0 mmHg. Aortic valve peak gradient measures 10.4 mmHg. Aortic valve area, by VTI measures 2.16  cm. Pulmonic Valve: The pulmonic valve was not well visualized. Pulmonic valve regurgitation is not visualized. No evidence of pulmonic stenosis. Aorta: The aortic root and ascending aorta are structurally normal, with no evidence of dilitation. Venous: The inferior vena cava is normal in size with greater than 50% respiratory variability, suggesting right atrial pressure of 3 mmHg. IAS/Shunts: The interatrial septum appears to be lipomatous. No atrial level shunt detected by color flow Doppler.  LEFT VENTRICLE PLAX 2D LVIDd:         3.60 cm   Diastology LVIDs:         2.30 cm   LV e'  medial:  9.46 cm/s LV PW:         0.80 cm   LV e' lateral: 12.60 cm/s LV IVS:        0.70 cm LVOT diam:     2.00 cm   2D Longitudinal Strain LV SV:         67        2D Strain GLS Avg:     -19.0 % LV SV Index:   41 LVOT Area:     3.14 cm                           3D Volume EF:                          3D EF:        60 %                          LV EDV:       117 ml                          LV ESV:       47 ml                          LV SV:        70 ml RIGHT VENTRICLE             IVC RV Basal diam:  3.00 cm     IVC diam: 1.90 cm RV S prime:     12.20 cm/s TAPSE (M-mode): 1.6 cm LEFT ATRIUM  Index        RIGHT ATRIUM           Index LA diam:        3.00 cm 1.85 cm/m   RA Area:     11.70 cm LA Vol (A2C):   33.8 ml 20.87 ml/m  RA Volume:   25.20 ml  15.58 ml/m LA Vol (A4C):   22.4 ml 13.85 ml/m LA Biplane Vol: 29.4 ml 18.18 ml/m  AORTIC VALVE AV Area (Vmax):    2.33 cm AV Area (Vmean):   2.50 cm AV Area (VTI):     2.16 cm AV Vmax:           161.58 cm/s AV Vmean:          105.780 cm/s AV VTI:            0.310 m AV Peak Grad:      10.4 mmHg AV Mean Grad:      5.0 mmHg LVOT Vmax:         120.00 cm/s LVOT Vmean:        84.200 cm/s LVOT VTI:          0.213 m LVOT/AV VTI ratio: 0.69  AORTA Ao Root diam: 2.60 cm Ao Asc diam:  2.90 cm  SHUNTS Systemic VTI:  0.21 m Systemic Diam: 2.00 cm Dina Rich MD Electronically signed by Dina Rich MD Signature Date/Time: 01/15/2023/4:09:45 PM    Final

## 2023-01-22 ENCOUNTER — Other Ambulatory Visit: Payer: Self-pay

## 2023-01-22 ENCOUNTER — Inpatient Hospital Stay: Payer: Medicaid Other

## 2023-01-22 ENCOUNTER — Other Ambulatory Visit: Payer: Self-pay | Admitting: Hematology

## 2023-01-22 ENCOUNTER — Inpatient Hospital Stay: Payer: Medicaid Other | Attending: Hematology

## 2023-01-22 ENCOUNTER — Inpatient Hospital Stay (HOSPITAL_BASED_OUTPATIENT_CLINIC_OR_DEPARTMENT_OTHER): Payer: Medicaid Other | Admitting: Hematology

## 2023-01-22 VITALS — HR 88

## 2023-01-22 DIAGNOSIS — C50111 Malignant neoplasm of central portion of right female breast: Secondary | ICD-10-CM | POA: Diagnosis not present

## 2023-01-22 DIAGNOSIS — C50911 Malignant neoplasm of unspecified site of right female breast: Secondary | ICD-10-CM

## 2023-01-22 DIAGNOSIS — Z87891 Personal history of nicotine dependence: Secondary | ICD-10-CM | POA: Insufficient documentation

## 2023-01-22 DIAGNOSIS — C7951 Secondary malignant neoplasm of bone: Secondary | ICD-10-CM | POA: Insufficient documentation

## 2023-01-22 DIAGNOSIS — C787 Secondary malignant neoplasm of liver and intrahepatic bile duct: Secondary | ICD-10-CM | POA: Diagnosis not present

## 2023-01-22 DIAGNOSIS — C7931 Secondary malignant neoplasm of brain: Secondary | ICD-10-CM | POA: Insufficient documentation

## 2023-01-22 DIAGNOSIS — Z5112 Encounter for antineoplastic immunotherapy: Secondary | ICD-10-CM | POA: Insufficient documentation

## 2023-01-22 DIAGNOSIS — Z170 Estrogen receptor positive status [ER+]: Secondary | ICD-10-CM | POA: Insufficient documentation

## 2023-01-22 DIAGNOSIS — E876 Hypokalemia: Secondary | ICD-10-CM

## 2023-01-22 LAB — COMPREHENSIVE METABOLIC PANEL
ALT: 26 U/L (ref 0–44)
AST: 23 U/L (ref 15–41)
Albumin: 2.2 g/dL — ABNORMAL LOW (ref 3.5–5.0)
Alkaline Phosphatase: 159 U/L — ABNORMAL HIGH (ref 38–126)
Anion gap: 10 (ref 5–15)
BUN: 14 mg/dL (ref 6–20)
CO2: 23 mmol/L (ref 22–32)
Calcium: 8.1 mg/dL — ABNORMAL LOW (ref 8.9–10.3)
Chloride: 98 mmol/L (ref 98–111)
Creatinine, Ser: 0.64 mg/dL (ref 0.44–1.00)
GFR, Estimated: >60 mL/min (ref >60)
Glucose, Bld: 122 mg/dL — ABNORMAL HIGH (ref 70–99)
Potassium: 3.1 mmol/L — ABNORMAL LOW (ref 3.5–5.1)
Sodium: 131 mmol/L — ABNORMAL LOW (ref 135–145)
Total Bilirubin: 0.4 mg/dL (ref 0.3–1.2)
Total Protein: 6.1 g/dL — ABNORMAL LOW (ref 6.5–8.1)

## 2023-01-22 LAB — CBC WITH DIFFERENTIAL/PLATELET
Abs Immature Granulocytes: 0.1 10*3/uL — ABNORMAL HIGH (ref 0.00–0.07)
Basophils Absolute: 0 10*3/uL (ref 0.0–0.1)
Basophils Relative: 1 %
Eosinophils Absolute: 0.1 10*3/uL (ref 0.0–0.5)
Eosinophils Relative: 1 %
HCT: 29.9 % — ABNORMAL LOW (ref 36.0–46.0)
Hemoglobin: 9.9 g/dL — ABNORMAL LOW (ref 12.0–15.0)
Immature Granulocytes: 1 %
Lymphocytes Relative: 18 %
Lymphs Abs: 1.5 10*3/uL (ref 0.7–4.0)
MCH: 30.6 pg (ref 26.0–34.0)
MCHC: 33.1 g/dL (ref 30.0–36.0)
MCV: 92.3 fL (ref 80.0–100.0)
Monocytes Absolute: 1.5 10*3/uL — ABNORMAL HIGH (ref 0.1–1.0)
Monocytes Relative: 18 %
Neutro Abs: 5.2 10*3/uL (ref 1.7–7.7)
Neutrophils Relative %: 61 %
Platelets: 393 10*3/uL (ref 150–400)
RBC: 3.24 MIL/uL — ABNORMAL LOW (ref 3.87–5.11)
RDW: 14.1 % (ref 11.5–15.5)
WBC: 8.4 10*3/uL (ref 4.0–10.5)
nRBC: 0 % (ref 0.0–0.2)

## 2023-01-22 LAB — MAGNESIUM: Magnesium: 1.8 mg/dL (ref 1.7–2.4)

## 2023-01-22 MED ORDER — OXYCODONE-ACETAMINOPHEN 7.5-325 MG PO TABS: 1.0000 | ORAL_TABLET | Freq: Four times a day (QID) | ORAL | 0 refills | Status: DC | PRN

## 2023-01-22 MED ORDER — POTASSIUM CHLORIDE 20 MEQ PO PACK: 40.0000 meq | PACK | Freq: Two times a day (BID) | ORAL | Status: DC

## 2023-01-22 MED ORDER — DEXTROSE 5 % IV SOLN: Freq: Once | INTRAVENOUS | Status: AC

## 2023-01-22 MED ORDER — SODIUM CHLORIDE 0.9% FLUSH
10.0000 mL | INTRAVENOUS | Status: DC | PRN
  Administered 2023-01-22: 10 mL

## 2023-01-22 MED ORDER — SODIUM CHLORIDE 0.9 % IV SOLN
10.0000 mg | Freq: Once | INTRAVENOUS | Status: AC
  Administered 2023-01-22: 10 mg via INTRAVENOUS
  Filled 2023-01-22: qty 10

## 2023-01-22 MED ORDER — SODIUM CHLORIDE 0.9% FLUSH
10.0000 mL | INTRAVENOUS | Status: DC | PRN
  Administered 2023-01-22: 10 mL via INTRAVENOUS

## 2023-01-22 MED ORDER — HEPARIN SOD (PORK) LOCK FLUSH 100 UNIT/ML IV SOLN
500.0000 [IU] | Freq: Once | INTRAVENOUS | Status: AC | PRN
  Administered 2023-01-22: 500 [IU]

## 2023-01-22 MED ORDER — PALONOSETRON HCL INJECTION 0.25 MG/5ML
0.2500 mg | Freq: Once | INTRAVENOUS | Status: AC
  Administered 2023-01-22: 0.25 mg via INTRAVENOUS
  Filled 2023-01-22: qty 5

## 2023-01-22 MED ORDER — ACETAMINOPHEN 325 MG PO TABS
650.0000 mg | ORAL_TABLET | Freq: Once | ORAL | Status: AC
  Administered 2023-01-22: 650 mg via ORAL
  Filled 2023-01-22: qty 2

## 2023-01-22 MED ORDER — CETIRIZINE HCL 10 MG/ML IV SOLN
10.0000 mg | Freq: Once | INTRAVENOUS | Status: AC
  Administered 2023-01-22: 10 mg via INTRAVENOUS
  Filled 2023-01-22: qty 1

## 2023-01-22 MED ORDER — POTASSIUM CHLORIDE CRYS ER 20 MEQ PO TBCR
40.0000 meq | EXTENDED_RELEASE_TABLET | Freq: Once | ORAL | Status: AC
  Administered 2023-01-22: 40 meq via ORAL
  Filled 2023-01-22: qty 2

## 2023-01-22 MED ORDER — SODIUM CHLORIDE 0.9 % IV SOLN
150.0000 mg | Freq: Once | INTRAVENOUS | Status: AC
  Administered 2023-01-22: 150 mg via INTRAVENOUS
  Filled 2023-01-22: qty 5

## 2023-01-22 MED ORDER — FAM-TRASTUZUMAB DERUXTECAN-NXKI CHEMO 100 MG IV SOLR
300.0000 mg | Freq: Once | INTRAVENOUS | Status: AC
  Administered 2023-01-22: 300 mg via INTRAVENOUS
  Filled 2023-01-22: qty 15

## 2023-01-22 NOTE — Patient Instructions (Signed)
[facility]  Discharge Instructions: Thank you for choosing [facility] to provide your oncology and hematology care.  If you have a lab appointment with the Cancer Center - please note that after April 8th, 2024, all labs will be drawn in the cancer center.  You do not have to check in or register with the main entrance as you have in the past but will complete your check-in in the cancer center.  Wear comfortable clothing and clothing appropriate for easy access to any Portacath or PICC line.   We strive to give you quality time with your provider. You may need to reschedule your appointment if you arrive late (15 or more minutes).  Arriving late affects you and other patients whose appointments are after yours.  Also, if you miss three or more appointments without notifying the office, you may be dismissed from the clinic at the provider's discretion.      For prescription refill requests, have your pharmacy contact our office and allow 72 hours for refills to be completed.  To help prevent nausea and vomiting after your treatment, we encourage you to take your nausea medication as directed.  BELOW ARE SYMPTOMS THAT SHOULD BE REPORTED IMMEDIATELY: *FEVER GREATER THAN 100.4 F (38 C) OR HIGHER *CHILLS OR SWEATING *NAUSEA AND VOMITING THAT IS NOT CONTROLLED WITH YOUR NAUSEA MEDICATION *UNUSUAL SHORTNESS OF BREATH *UNUSUAL BRUISING OR BLEEDING *URINARY PROBLEMS (pain or burning when urinating, or frequent urination) *BOWEL PROBLEMS (unusual diarrhea, constipation, pain near the anus) TENDERNESS IN MOUTH AND THROAT WITH OR WITHOUT PRESENCE OF ULCERS (sore throat, sores in mouth, or a toothache) UNUSUAL RASH, SWELLING OR PAIN  UNUSUAL VAGINAL DISCHARGE OR ITCHING   Items with * indicate a potential emergency and should be followed up as soon as possible or go to the Emergency Department if any problems should occur.  Please show the CHEMOTHERAPY ALERT CARD or  IMMUNOTHERAPY ALERT CARD at check-in to the Emergency Department and triage nurse.  Should you have questions after your visit or need to cancel or reschedule your appointment, please contact [facility] [phone]  and follow the prompts.  Office hours are 8:00 a.m. to 4:30 p.m. Monday - Friday. Please note that voicemails left after 4:00 p.m. may not be returned until the following business day.  We are closed weekends and major holidays. You have access to a nurse at all times for urgent questions. Please call the main number to the clinic [phone] and follow the prompts.  For any non-urgent questions, you may also contact your provider using MyChart. We now offer e-Visits for anyone 18 and older to request care online for non-urgent symptoms. For details visit [email or web address].   Also download the MyChart app! Go to the app store, search "MyChart", open the app, select [facility], and log in with your MyChart username and password.   

## 2023-01-22 NOTE — Patient Instructions (Addendum)
[facility] at [facility] Discharge Instructions   You were seen and examined today by Dr. Ellin Saba.  He reviewed the results of your lab work which are normal/stable.   We will proceed with your treatment today.   Get Imodium and take it as needed for diarrhea.   Pick up the Zofran that was sent to your pharmacy and use as needed for nauses.   Return as scheduled.    Thank you for choosing [facility] at [facility] to provide your oncology and hematology care.  To afford each patient quality time with our provider, please arrive at least 15 minutes before your scheduled appointment time.   If you have a lab appointment with the Cancer Center please come in thru the Main Entrance and check in at the main information desk.  You need to re-schedule your appointment should you arrive 10 or more minutes late.  We strive to give you quality time with our providers, and arriving late affects you and other patients whose appointments are after yours.  Also, if you no show three or more times for appointments you may be dismissed from the clinic at the providers discretion.     Again, thank you for choosing [facility].  Our hope is that these requests will decrease the amount of time that you wait before being seen by our physicians.       _____________________________________________________________  Should you have questions after your visit to [facility], please contact our office at [phone] and follow the prompts.  Our office hours are 8:00 a.m. and 4:30 p.m. Monday - Friday.  Please note that voicemails left after 4:00 p.m. may not be returned until the following business day.  We are closed weekends and major holidays.  You do have access to a nurse 24-7, just call the main number to the clinic [phone] and do not press any options, hold on the line and a nurse will answer the phone.    For prescription  refill requests, have your pharmacy contact our office and allow 72 hours.    Due to Covid, you will need to wear a mask upon entering the hospital. If you do not have a mask, a mask will be given to you at the Main Entrance upon arrival. For doctor visits, patients may have 1 support person age 80 or older with them. For treatment visits, patients can not have anyone with them due to social distancing guidelines and our immunocompromised population.

## 2023-01-22 NOTE — Progress Notes (Signed)
Potassium 3.1 today.  Potassium 40 meq by mouth verbal order Dr. Ellin Saba.    Patient tolerated chemotherapy with no complaints voiced.  Side effects with management reviewed with understanding verbalized.  Port site clean and dry with no bruising or swelling noted at site.  Good blood return noted before and after administration of chemotherapy.  Band aid applied.  Patient left in satisfactory condition with VSS and no s/s of distress noted.

## 2023-01-22 NOTE — Progress Notes (Signed)
Patient has been examined by Dr. Ellin Saba. Vital signs (HR 108) and labs have been reviewed by MD - ANC, Creatinine, LFTs, hemoglobin, and platelets are within treatment parameters per M.D. - pt may proceed with treatment.  Primary RN and pharmacy notified.

## 2023-01-27 NOTE — Addendum Note (Signed)
Encounter addended by: Donalee Citrin, MD on: 01/27/2023 3:58 PM  Actions taken: Clinical Note Signed

## 2023-01-27 NOTE — Procedures (Signed)
  Name: Sara [name]  MRN: [id number]  Date: 12/05/2022   DOB: 10/13/1975  Stereotactic Radiosurgery Operative Note  PRE-OPERATIVE DIAGNOSIS:  Solitary Brain Metastasis  POST-OPERATIVE DIAGNOSIS:  Solitary Brain Metastasis  PROCEDURE:  Stereotactic Radiosurgery  SURGEON:  Mariam Dollar, MD  NARRATIVE: The patient underwent a radiation treatment planning session in the radiation oncology simulation suite under the care of the radiation oncology physician and physicist.  I participated closely in the radiation treatment planning afterwards. The patient underwent planning CT which was fused to 3T high resolution MRI with 1 mm axial slices.  These images were fused on the planning system.  We contoured the gross target volumes and subsequently expanded this to yield the Planning Target Volume. I actively participated in the planning process.  I helped to define and review the target contours and also the contours of the optic pathway, eyes, brainstem and selected nearby organs at risk.  All the dose constraints for critical structures were reviewed and compared to AAPM Task Group 101.  The prescription dose conformity was reviewed.  I approved the plan electronically.    Accordingly, Sara [name] was brought to the TrueBeam stereotactic radiation treatment linac and placed in the custom immobilization mask.  The patient was aligned according to the IR fiducial markers with BrainLab Exactrac, then orthogonal x-rays were used in ExacTrac with the 6DOF robotic table and the shifts were made to align the patient  Sara [name] received stereotactic radiosurgery uneventfully.    The detailed description of the procedure is recorded in the radiation oncology procedure note.  I was present for the duration of the procedure.  DISPOSITION:  Following delivery, the patient was transported to nursing in stable condition and monitored for possible acute effects to be discharged to home in stable condition  with follow-up in one month.  Mariam Dollar, MD 01/27/2023 3:58 PM

## 2023-02-09 ENCOUNTER — Other Ambulatory Visit: Payer: Self-pay

## 2023-02-09 MED ORDER — ONDANSETRON HCL 8 MG PO TABS: ORAL_TABLET | ORAL | 5 refills | Status: DC

## 2023-02-09 MED ORDER — PANTOPRAZOLE SODIUM 20 MG PO TBEC: 20.0000 mg | DELAYED_RELEASE_TABLET | Freq: Every day | ORAL | 5 refills | Status: DC

## 2023-02-11 NOTE — Progress Notes (Signed)
[facility] [address] [place] [address]    Clinic Day:  02/12/2023  Referring physician: Toma Deiters, MD  Patient Care Team: Toma Deiters, MD as PCP - General (Internal Medicine) Doreatha Massed, MD as Medical Oncologist (Medical Oncology) Therese Sarah, RN as Oncology Nurse Navigator (Oncology)   ASSESSMENT & PLAN:   Assessment: 1. Her2+ metastatic breast cancer: - She reports feeling a knot in the right breast in July 2022.  She felt the same lump in September which has increased slightly in November.  In the last 3 months, it has increased in size from a quarter to size of an orange. - Mammogram/ultrasound on 10/01/2021: Large irregular mass involving the entire central right breast measuring 4.9 x 2.4 x 4 cm at 12:00.  At least 3 lymph nodes in the right axilla.  Overlying skin thickening and generalized erythema and marked tenderness. - Right breast mass and axillary lymph node biopsy on 10/08/2021: - Pathology: Poorly differentiated ductal carcinoma, grade 3, HER2 3+ positive, ER 5% strong staining intensity, PR negative, Ki-67 25%. - PET scan (11/30/2021): Hypermetabolic tumor involving right breast, metastatic mediastinal and hilar lymphadenopathy, hepatic metastatic disease, diffuse lytic bone lesions. - 4 cycles of THP from 12/03/2021 through 02/25/2022 - Cerebellar mass resection (11/12/2022): Metastatic carcinoma with tumor cells positive for CK7, GATA3, indicating breast primary.  ER and CK20 are negative.  HER2 is 3+. - Enhertu started on 01/01/2023   2. Social/family history: - She is currently living with her father, youngest daughter and sister.  She has 2 daughters ages 59 and 72.  She has worked in Journalist, newspaper previously.  She quit smoking 3 months ago.  Prior to that she smoked a pack a day for 4 to 5 years and less than half pack a day since 2011.  She vapes occasionally at this time. - Paternal great grandmother had  lung cancer.  Paternal aunt had lung cancer at age 14.  Paternal grandmother had breast cancer in her 72s.  Paternal grandfather had colon cancer.  Maternal grandfather had colon cancer.    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 - No major GI side effects. - Reviewed labs today: Improving alk phos and albumin.  Rest of LFTs are normal.  CBC grossly normal. - Proceed with cycle 3 today. - RTC 3 weeks for follow-up.  Plan on repeating CT CAP scan after cycle 4.  2.  Brain metastasis: - Dexamethasone has been tapered off.  No headaches reported.  3.  Anxiety: - Continue Xanax 0.5 mg at bedtime as needed.  4.  Chest pain, back pain, right ankle pain: - Continue Percocet 7.5/325 every 6 hours as needed.  Urine drug screen positive for benzodiazepines and opiates. - Continue prochlorperazine for nausea as needed.   5.  High risk drug monitoring: - 2D echo on 01/15/2023: EF 60 to 65%.    Orders Placed This Encounter  Procedures   Cancer antigen 27.29    Standing Status:   Standing    Number of Occurrences:   10    Standing Expiration Date:   02/12/2024   Cancer antigen 15-3    Standing Status:   Standing    Number of Occurrences:   10    Standing Expiration Date:   02/12/2024      I,Katie Daubenspeck,acting as a scribe for Doreatha Massed, MD.,have documented all relevant  documentation on the behalf of Doreatha Massed, MD,as directed by  Doreatha Massed, MD while in the presence of Doreatha Massed, MD.   I, Doreatha Massed MD, have reviewed the above documentation for accuracy and completeness, and I agree with the above.   Doreatha Massed, MD   6/27/20245:18 PM  CHIEF COMPLAINT:   Diagnosis: Her2+ metastatic right breast cancer    Cancer Staging  HER2-positive carcinoma of right breast [facility]) Staging form: Breast, AJCC 8th Edition - Clinical stage from 10/28/2021: Stage IV (cT4d,  cN1, cM1, G3, ER+, PR-, HER2+) - Unsigned    Prior Therapy: DOCEtaxel + Trastuzumab + Pertuzumab (THP) q21d x 4 cycles   Current Therapy:  Enhertu    HISTORY OF PRESENT ILLNESS:   Oncology History  HER2-positive carcinoma of right breast (HCC)  10/28/2021 Initial Diagnosis   Breast cancer, right breast (HCC)   12/03/2021 - 02/25/2022 Chemotherapy   Patient is on Treatment Plan : BREAST DOCEtaxel + Trastuzumab + Pertuzumab (THP) q21d x 8 cycles / Trastuzumab + Pertuzumab q21d x 4 cycles     01/01/2023 -  Chemotherapy   Patient is on Treatment Plan : BREAST METASTATIC Fam-Trastuzumab Deruxtecan-nxki (Enhertu) (5.4) q21d        INTERVAL HISTORY:   Sara [name] is a 47 y.o. female presenting to clinic today for follow up of Her2+ metastatic right breast cancer. She was last seen by me on 01/22/23.  Today, she states that she is doing well overall. Her appetite level is at 100%. Her energy level is at 75%.  PAST MEDICAL HISTORY:   Past Medical History: Past Medical History:  Diagnosis Date   Anxiety    Back pain    Cancer [facility])     Surgical History: Past Surgical History:  Procedure Laterality Date   CHOLECYSTECTOMY     ORTHOPEDIC SURGERY     PORTACATH PLACEMENT Left 11/18/2021   Procedure: INSERTION PORT-A-CATH;  Surgeon: Lucretia Roers, MD;  Location: [facility];  Service: General;  Laterality: Left;   SUBOCCIPITAL CRANIECTOMY CERVICAL LAMINECTOMY N/A 11/12/2022   Procedure: SUBOCCIPITAL CRANIECTOMY FOR RESECTION OF CEREBELLAR MASS;  Surgeon: Donalee Citrin, MD;  Location: [facility] OR;  Service: Neurosurgery;  Laterality: N/A;    Social History: Social History   Socioeconomic History   Marital status: Legally Separated    Spouse name: Not on file   Number of children: Not on file   Years of education: Not on file   Highest education level: Not on file  Occupational History   Not on file  Tobacco Use   Smoking status: Former    Packs/day: 1.00    Years: 5.00    Additional pack  years: 0.00    Total pack years: 5.00    Types: Cigarettes    Quit date: 11/08/2021    Years since quitting: 1.2   Smokeless tobacco: Never  Vaping Use   Vaping Use: Never used  Substance and Sexual Activity   Alcohol use: No    Comment: occasionally   Drug use: No   Sexual activity: Yes    Birth control/protection: None  Other Topics Concern   Not on file  Social History Narrative   Not on file   Social Determinants of Health   Financial Resource Strain: High Risk (12/03/2021)   Overall Financial Resource Strain (CARDIA)    Difficulty of Paying Living Expenses: Very hard  Food Insecurity: No Food Insecurity (11/25/2022)   Hunger Vital Sign    Worried About Programme researcher, broadcasting/film/video in  the Last Year: Never true    Ran Out of Food in the Last Year: Never true  Transportation Needs: No Transportation Needs (11/25/2022)   PRAPARE - Administrator, Civil Service (Medical): No    Lack of Transportation (Non-Medical): No  Recent Concern: Transportation Needs - Unmet Transportation Needs (11/11/2022)   PRAPARE - Transportation    Lack of Transportation (Medical): Yes    Lack of Transportation (Non-Medical): Yes  Physical Activity: Sufficiently Active (06/17/2021)   Exercise Vital Sign    Days of Exercise per Week: 5 days    Minutes of Exercise per Session: 30 min  Stress: Stress Concern Present (06/17/2021)   [employer] of Occupational Health - Occupational Stress Questionnaire    Feeling of Stress : Rather much  Social Connections: Socially Isolated (06/17/2021)   Social Connection and Isolation Panel [NHANES]    Frequency of Communication with Friends and Family: Twice a week    Frequency of Social Gatherings with Friends and Family: Never    Attends Religious Services: 1 to 4 times per year    Active Member of [employer] or Organizations: No    Attends Banker Meetings: Never    Marital Status: Divorced  Catering manager Violence: Not At Risk (11/25/2022)    Humiliation, Afraid, Rape, and Kick questionnaire    Fear of Current or Ex-Partner: No    Emotionally Abused: No    Physically Abused: No    Sexually Abused: No    Family History: Family History  Problem Relation Age of Onset   Heart disease Brother    Lung cancer Paternal Grandmother        lung   Cancer Paternal Grandfather    Asthma Daughter    Asthma Daughter     Current Medications:  Current Outpatient Medications:    ALPRAZolam (XANAX) 0.5 MG tablet, TAKE 1 TABLET BY MOUTH AT BEDTIME AS NEEDED FOR ANXIETY, Disp: 30 tablet, Rfl: 0   docusate sodium (COLACE) 100 MG capsule, Take 1 capsule (100 mg total) by mouth 2 (two) times daily., Disp: , Rfl:    furosemide (LASIX) 20 MG tablet, Take 1 tablet (20 mg total) by mouth daily as needed., Disp: 30 tablet, Rfl: 0   ondansetron (ZOFRAN) 8 MG tablet, TAKE 1 TABLET BY MOUTH EVERY 8 HOURS AS NEEDED FOR NAUSEA FOR VOMITING, Disp: 90 tablet, Rfl: 5   oxyCODONE-acetaminophen (PERCOCET) 7.5-325 MG tablet, Take 1 tablet by mouth every 6 (six) hours as needed for severe pain., Disp: 84 tablet, Rfl: 0   pantoprazole (PROTONIX) 20 MG tablet, Take 1 tablet (20 mg total) by mouth daily., Disp: 30 tablet, Rfl: 5   prochlorperazine (COMPAZINE) 10 MG tablet, Take 1 tablet (10 mg total) by mouth every 6 (six) hours as needed for nausea or vomiting., Disp: 30 tablet, Rfl: 5 No current facility-administered medications for this visit.  Facility-Administered Medications Ordered in Other Visits:    sodium chloride flush (NS) 0.9 % injection 10 mL, 10 mL, Intracatheter, PRN, Doreatha Massed, MD, 10 mL at 02/12/23 1238   Allergies: Allergies  Allergen Reactions   Hydrocodone Itching, Nausea And Vomiting, Rash and Other (See Comments)    Hallucinations when given vicodin post crani in 2024   Sulfa Antibiotics Nausea And Vomiting   Morphine And Codeine Rash    REVIEW OF SYSTEMS:   Review of Systems  Constitutional:  Negative for chills,  fatigue and fever.  HENT:   Negative for lump/mass, mouth sores, nosebleeds, sore throat  and trouble swallowing.   Eyes:  Negative for eye problems.  Respiratory:  Negative for cough and shortness of breath.   Cardiovascular:  Negative for chest pain, leg swelling and palpitations.  Gastrointestinal:  Positive for diarrhea, nausea and vomiting. Negative for abdominal pain and constipation.  Genitourinary:  Negative for bladder incontinence, difficulty urinating, dysuria, frequency, hematuria and nocturia.   Musculoskeletal:  Positive for back pain. Negative for arthralgias, flank pain, myalgias and neck pain.  Skin:  Negative for itching and rash.  Neurological:  Negative for dizziness, headaches and numbness.  Hematological:  Does not bruise/bleed easily.  Psychiatric/Behavioral:  Negative for depression, sleep disturbance and suicidal ideas. The patient is nervous/anxious.   All other systems reviewed and are negative.    VITALS:   There were no vitals taken for this visit.  Wt Readings from Last 3 Encounters:  02/12/23 127 lb 12.8 oz (58 kg)  01/22/23 129 lb 9.6 oz (58.8 kg)  01/01/23 127 lb 12.8 oz (58 kg)    There is no height or weight on file to calculate BMI.  Performance status (ECOG): 1 - Symptomatic but completely ambulatory  PHYSICAL EXAM:   Physical Exam Vitals and nursing note reviewed. Exam conducted with a chaperone present.  Constitutional:      Appearance: Normal appearance.  Cardiovascular:     Rate and Rhythm: Normal rate and regular rhythm.     Pulses: Normal pulses.     Heart sounds: Normal heart sounds.  Pulmonary:     Effort: Pulmonary effort is normal.     Breath sounds: Normal breath sounds.  Abdominal:     Palpations: Abdomen is soft. There is no hepatomegaly, splenomegaly or mass.     Tenderness: There is no abdominal tenderness.  Musculoskeletal:     Right lower leg: No edema.     Left lower leg: No edema.  Lymphadenopathy:     Cervical: No  cervical adenopathy.     Right cervical: No superficial, deep or posterior cervical adenopathy.    Left cervical: No superficial, deep or posterior cervical adenopathy.     Upper Body:     Right upper body: No supraclavicular or axillary adenopathy.     Left upper body: No supraclavicular or axillary adenopathy.  Neurological:     General: No focal deficit present.     Mental Status: She is alert and oriented to person, place, and time.  Psychiatric:        Mood and Affect: Mood normal.        Behavior: Behavior normal.     LABS:      Latest Ref Rng & Units 02/12/2023    9:04 AM 01/22/2023    8:46 AM 01/01/2023   11:59 AM  CBC  WBC 4.0 - 10.5 K/uL 8.2  8.4  15.0   Hemoglobin 12.0 - 15.0 g/dL 95.6  9.9  38.7   Hematocrit 36.0 - 46.0 % 33.3  29.9  36.9   Platelets 150 - 400 K/uL 342  393  428       Latest Ref Rng & Units 02/12/2023    9:04 AM 01/22/2023    8:46 AM 01/01/2023   11:59 AM  CMP  Glucose 70 - 99 mg/dL 89  564  332   BUN 6 - 20 mg/dL 14  14  32   Creatinine 0.44 - 1.00 mg/dL 9.51  8.84  1.66   Sodium 135 - 145 mmol/L 136  131  131   Potassium 3.5 -  5.1 mmol/L 3.8  3.1  4.1   Chloride 98 - 111 mmol/L 103  98  93   CO2 22 - 32 mmol/L 25  23  27    Calcium 8.9 - 10.3 mg/dL 8.5  8.1  9.2   Total Protein 6.5 - 8.1 g/dL 6.2  6.1  6.7   Total Bilirubin 0.3 - 1.2 mg/dL 0.3  0.4  0.5   Alkaline Phos 38 - 126 U/L 146  159  151   AST 15 - 41 U/L 37  23  39   ALT 0 - 44 U/L 37  26  94      No results found for: "CEA1", "CEA" / No results found for: "CEA1", "CEA" No results found for: "PSA1" No results found for: "MKL491" No results found for: "CAN125"  No results found for: "TOTALPROTELP", "ALBUMINELP", "A1GS", "A2GS", "BETS", "BETA2SER", "GAMS", "MSPIKE", "SPEI" No results found for: "TIBC", "FERRITIN", "IRONPCTSAT" No results found for: "LDH"   STUDIES:   ECHOCARDIOGRAM COMPLETE  Result Date: 01/15/2023    ECHOCARDIOGRAM REPORT   Patient Name:   SANYIA DINI  Date of Exam: 01/15/2023 Medical Rec #:  [id number]       Height:       64.0 in Accession #:    [id number]      Weight:       127.8 lb Date of Birth:  11-20-1975       BSA:          1.617 m Patient Age:    47 years        BP:           96/64 mmHg Patient Gender: F               HR:           87 bpm. [facility] Procedure: 2D Echo, 3D Echo, Color Doppler, Cardiac Doppler and Strain Analysis Indications:    Chemo  History:        Patient has prior history of Echocardiogram examinations, most                 recent 10/29/2021. Risk Factors:Former Smoker. CA.  Sonographer:    Milda Smart Referring Phys: [id number] Doreatha Massed  Sonographer Comments: Global longitudinal strain was attempted. IMPRESSIONS  1. Left ventricular ejection fraction, by estimation, is 60 to 65%. The left ventricle has normal function. The left ventricle has no regional wall motion abnormalities. Left ventricular diastolic parameters were normal. The average left ventricular global longitudinal strain is -19.0 %. The global longitudinal strain is normal.  2. Right ventricular systolic function is normal. The right ventricular size is normal. Tricuspid regurgitation signal is inadequate for assessing PA pressure.  3. The mitral valve is normal in structure. No evidence of mitral valve regurgitation. No evidence of mitral stenosis.  4. The tricuspid valve is abnormal.  5. The aortic valve is tricuspid. Aortic valve regurgitation is not visualized. No aortic stenosis is present.  6. The inferior vena cava is normal in size with greater than 50% respiratory variability, suggesting right atrial pressure of 3 mmHg. FINDINGS  Left Ventricle: Left ventricular ejection fraction, by estimation, is 60 to 65%. The left ventricle has normal function. The left ventricle has no regional wall motion abnormalities. The average left ventricular global longitudinal strain is -19.0 %. The global longitudinal strain is normal. The left ventricular  internal cavity size was normal in size. There is no left ventricular hypertrophy. Left  ventricular diastolic parameters were normal. Right Ventricle: The right ventricular size is normal. Right vetricular wall thickness was not well visualized. Right ventricular systolic function is normal. Tricuspid regurgitation signal is inadequate for assessing PA pressure. Left Atrium: Left atrial size was normal in size. Right Atrium: Right atrial size was normal in size. Pericardium: There is no evidence of pericardial effusion. Mitral Valve: The mitral valve is normal in structure. No evidence of mitral valve regurgitation. No evidence of mitral valve stenosis. Tricuspid Valve: The tricuspid valve is abnormal. Tricuspid valve regurgitation is mild . No evidence of tricuspid stenosis. Aortic Valve: The aortic valve is tricuspid. Aortic valve regurgitation is not visualized. No aortic stenosis is present. Aortic valve mean gradient measures 5.0 mmHg. Aortic valve peak gradient measures 10.4 mmHg. Aortic valve area, by VTI measures 2.16  cm. Pulmonic Valve: The pulmonic valve was not well visualized. Pulmonic valve regurgitation is not visualized. No evidence of pulmonic stenosis. Aorta: The aortic root and ascending aorta are structurally normal, with no evidence of dilitation. Venous: The inferior vena cava is normal in size with greater than 50% respiratory variability, suggesting right atrial pressure of 3 mmHg. IAS/Shunts: The interatrial septum appears to be lipomatous. No atrial level shunt detected by color flow Doppler.  LEFT VENTRICLE PLAX 2D LVIDd:         3.60 cm   Diastology LVIDs:         2.30 cm   LV e' medial:  9.46 cm/s LV PW:         0.80 cm   LV e' lateral: 12.60 cm/s LV IVS:        0.70 cm LVOT diam:     2.00 cm   2D Longitudinal Strain LV SV:         67        2D Strain GLS Avg:     -19.0 % LV SV Index:   41 LVOT Area:     3.14 cm                           3D Volume EF:                          3D EF:         60 %                          LV EDV:       117 ml                          LV ESV:       47 ml                          LV SV:        70 ml RIGHT VENTRICLE             IVC RV Basal diam:  3.00 cm     IVC diam: 1.90 cm RV S prime:     12.20 cm/s TAPSE (M-mode): 1.6 cm LEFT ATRIUM             Index        RIGHT ATRIUM           Index LA diam:        3.00 cm 1.85  cm/m   RA Area:     11.70 cm LA Vol (A2C):   33.8 ml 20.87 ml/m  RA Volume:   25.20 ml  15.58 ml/m LA Vol (A4C):   22.4 ml 13.85 ml/m LA Biplane Vol: 29.4 ml 18.18 ml/m  AORTIC VALVE AV Area (Vmax):    2.33 cm AV Area (Vmean):   2.50 cm AV Area (VTI):     2.16 cm AV Vmax:           161.58 cm/s AV Vmean:          105.780 cm/s AV VTI:            0.310 m AV Peak Grad:      10.4 mmHg AV Mean Grad:      5.0 mmHg LVOT Vmax:         120.00 cm/s LVOT Vmean:        84.200 cm/s LVOT VTI:          0.213 m LVOT/AV VTI ratio: 0.69  AORTA Ao Root diam: 2.60 cm Ao Asc diam:  2.90 cm  SHUNTS Systemic VTI:  0.21 m Systemic Diam: 2.00 cm Dina Rich MD Electronically signed by Dina Rich MD Signature Date/Time: 01/15/2023/4:09:45 PM    Final

## 2023-02-12 ENCOUNTER — Other Ambulatory Visit: Payer: Self-pay

## 2023-02-12 ENCOUNTER — Other Ambulatory Visit: Payer: Self-pay | Admitting: *Deleted

## 2023-02-12 ENCOUNTER — Inpatient Hospital Stay: Payer: Medicaid Other

## 2023-02-12 ENCOUNTER — Inpatient Hospital Stay (HOSPITAL_BASED_OUTPATIENT_CLINIC_OR_DEPARTMENT_OTHER): Payer: Medicaid Other | Admitting: Hematology

## 2023-02-12 VITALS — BP 110/78 | HR 99 | Temp 98.2°F | Resp 18 | Wt 127.8 lb

## 2023-02-12 VITALS — BP 115/78 | HR 81 | Temp 97.6°F | Resp 18

## 2023-02-12 DIAGNOSIS — C50911 Malignant neoplasm of unspecified site of right female breast: Secondary | ICD-10-CM | POA: Diagnosis not present

## 2023-02-12 DIAGNOSIS — F1920 Other psychoactive substance dependence, uncomplicated: Secondary | ICD-10-CM

## 2023-02-12 DIAGNOSIS — Z95828 Presence of other vascular implants and grafts: Secondary | ICD-10-CM

## 2023-02-12 DIAGNOSIS — Z5112 Encounter for antineoplastic immunotherapy: Secondary | ICD-10-CM | POA: Diagnosis not present

## 2023-02-12 LAB — COMPREHENSIVE METABOLIC PANEL
ALT: 37 U/L (ref 0–44)
AST: 37 U/L (ref 15–41)
Albumin: 3 g/dL — ABNORMAL LOW (ref 3.5–5.0)
Alkaline Phosphatase: 146 U/L — ABNORMAL HIGH (ref 38–126)
Anion gap: 8 (ref 5–15)
BUN: 14 mg/dL (ref 6–20)
CO2: 25 mmol/L (ref 22–32)
Calcium: 8.5 mg/dL — ABNORMAL LOW (ref 8.9–10.3)
Chloride: 103 mmol/L (ref 98–111)
Creatinine, Ser: 0.79 mg/dL (ref 0.44–1.00)
GFR, Estimated: >60 mL/min (ref >60)
Glucose, Bld: 89 mg/dL (ref 70–99)
Potassium: 3.8 mmol/L (ref 3.5–5.1)
Sodium: 136 mmol/L (ref 135–145)
Total Bilirubin: 0.3 mg/dL (ref 0.3–1.2)
Total Protein: 6.2 g/dL — ABNORMAL LOW (ref 6.5–8.1)

## 2023-02-12 LAB — CBC WITH DIFFERENTIAL/PLATELET
Abs Immature Granulocytes: 0.05 10*3/uL (ref 0.00–0.07)
Basophils Absolute: 0.1 10*3/uL (ref 0.0–0.1)
Basophils Relative: 1 %
Eosinophils Absolute: 0.1 10*3/uL (ref 0.0–0.5)
Eosinophils Relative: 1 %
HCT: 33.3 % — ABNORMAL LOW (ref 36.0–46.0)
Hemoglobin: 10.7 g/dL — ABNORMAL LOW (ref 12.0–15.0)
Immature Granulocytes: 1 %
Lymphocytes Relative: 21 %
Lymphs Abs: 1.7 10*3/uL (ref 0.7–4.0)
MCH: 30.3 pg (ref 26.0–34.0)
MCHC: 32.1 g/dL (ref 30.0–36.0)
MCV: 94.3 fL (ref 80.0–100.0)
Monocytes Absolute: 1.1 10*3/uL — ABNORMAL HIGH (ref 0.1–1.0)
Monocytes Relative: 13 %
Neutro Abs: 5.2 10*3/uL (ref 1.7–7.7)
Neutrophils Relative %: 63 %
Platelets: 342 10*3/uL (ref 150–400)
RBC: 3.53 MIL/uL — ABNORMAL LOW (ref 3.87–5.11)
RDW: 15.9 % — ABNORMAL HIGH (ref 11.5–15.5)
WBC: 8.2 10*3/uL (ref 4.0–10.5)
nRBC: 0 % (ref 0.0–0.2)

## 2023-02-12 LAB — RAPID URINE DRUG SCREEN, HOSP PERFORMED
Amphetamines: NOT DETECTED
Barbiturates: NOT DETECTED
Benzodiazepines: POSITIVE — AB
Cocaine: NOT DETECTED
Opiates: POSITIVE — AB
Tetrahydrocannabinol: NOT DETECTED

## 2023-02-12 LAB — PREGNANCY, URINE: Preg Test, Ur: NEGATIVE

## 2023-02-12 LAB — MAGNESIUM: Magnesium: 1.8 mg/dL (ref 1.7–2.4)

## 2023-02-12 MED ORDER — SODIUM CHLORIDE 0.9% FLUSH
10.0000 mL | INTRAVENOUS | Status: DC | PRN
  Administered 2023-02-12: 10 mL

## 2023-02-12 MED ORDER — FAM-TRASTUZUMAB DERUXTECAN-NXKI CHEMO 100 MG IV SOLR
5.2000 mg/kg | Freq: Once | INTRAVENOUS | Status: AC
  Administered 2023-02-12: 300 mg via INTRAVENOUS
  Filled 2023-02-12: qty 15

## 2023-02-12 MED ORDER — OXYCODONE-ACETAMINOPHEN 7.5-325 MG PO TABS: 1.0000 | ORAL_TABLET | Freq: Four times a day (QID) | ORAL | 0 refills | Status: DC | PRN

## 2023-02-12 MED ORDER — HEPARIN SOD (PORK) LOCK FLUSH 100 UNIT/ML IV SOLN
500.0000 [IU] | Freq: Once | INTRAVENOUS | Status: AC | PRN
  Administered 2023-02-12: 500 [IU]

## 2023-02-12 MED ORDER — SODIUM CHLORIDE 0.9 % IV SOLN
10.0000 mg | Freq: Once | INTRAVENOUS | Status: AC
  Administered 2023-02-12: 10 mg via INTRAVENOUS
  Filled 2023-02-12: qty 10

## 2023-02-12 MED ORDER — ALPRAZOLAM 0.5 MG PO TABS: ORAL_TABLET | ORAL | 0 refills | Status: DC

## 2023-02-12 MED ORDER — SODIUM CHLORIDE 0.9% FLUSH
10.0000 mL | Freq: Once | INTRAVENOUS | Status: AC
  Administered 2023-02-12: 10 mL via INTRAVENOUS

## 2023-02-12 MED ORDER — ACETAMINOPHEN 325 MG PO TABS
650.0000 mg | ORAL_TABLET | Freq: Once | ORAL | Status: AC
  Administered 2023-02-12: 650 mg via ORAL
  Filled 2023-02-12: qty 2

## 2023-02-12 MED ORDER — SODIUM CHLORIDE 0.9 % IV SOLN
150.0000 mg | Freq: Once | INTRAVENOUS | Status: AC
  Administered 2023-02-12: 150 mg via INTRAVENOUS
  Filled 2023-02-12: qty 150

## 2023-02-12 MED ORDER — CETIRIZINE HCL 10 MG/ML IV SOLN
10.0000 mg | Freq: Once | INTRAVENOUS | Status: AC
  Administered 2023-02-12: 10 mg via INTRAVENOUS
  Filled 2023-02-12: qty 1

## 2023-02-12 MED ORDER — DEXTROSE 5 % IV SOLN: Freq: Once | INTRAVENOUS | Status: AC

## 2023-02-12 MED ORDER — PALONOSETRON HCL INJECTION 0.25 MG/5ML
0.2500 mg | Freq: Once | INTRAVENOUS | Status: AC
  Administered 2023-02-12: 0.25 mg via INTRAVENOUS
  Filled 2023-02-12: qty 5

## 2023-02-12 NOTE — Progress Notes (Signed)
Patient has been examined by Dr. Katragadda. Vital signs and labs have been reviewed by MD - ANC, Creatinine, LFTs, hemoglobin, and platelets are within treatment parameters per M.D. - pt may proceed with treatment.  Primary RN and pharmacy notified.  

## 2023-02-12 NOTE — Patient Instructions (Signed)
[facility] at [facility] Discharge Instructions   You were seen and examined today by Dr. Katragadda.  He reviewed the results of your lab work which are normal/stable.   We will proceed with your treatment today.  Return as scheduled.    Thank you for choosing [facility] at [facility] to provide your oncology and hematology care.  To afford each patient quality time with our provider, please arrive at least 15 minutes before your scheduled appointment time.   If you have a lab appointment with the Cancer Center please come in thru the Main Entrance and check in at the main information desk.  You need to re-schedule your appointment should you arrive 10 or more minutes late.  We strive to give you quality time with our providers, and arriving late affects you and other patients whose appointments are after yours.  Also, if you no show three or more times for appointments you may be dismissed from the clinic at the providers discretion.     Again, thank you for choosing [facility].  Our hope is that these requests will decrease the amount of time that you wait before being seen by our physicians.       _____________________________________________________________  Should you have questions after your visit to [facility], please contact our office at [phone] and follow the prompts.  Our office hours are 8:00 a.m. and 4:30 p.m. Monday - Friday.  Please note that voicemails left after 4:00 p.m. may not be returned until the following business day.  We are closed weekends and major holidays.  You do have access to a nurse 24-7, just call the main number to the clinic [phone] and do not press any options, hold on the line and a nurse will answer the phone.    For prescription refill requests, have your pharmacy contact our office and allow 72 hours.    Due to Covid, you will need to wear a mask upon entering  the hospital. If you do not have a mask, a mask will be given to you at the Main Entrance upon arrival. For doctor visits, patients may have 1 support person age 18 or older with them. For treatment visits, patients can not have anyone with them due to social distancing guidelines and our immunocompromised population.      

## 2023-02-12 NOTE — Patient Instructions (Signed)
[facility]  Discharge Instructions: Thank you for choosing [facility] to provide your oncology and hematology care.  If you have a lab appointment with the Cancer Center - please note that after April 8th, 2024, all labs will be drawn in the cancer center.  You do not have to check in or register with the main entrance as you have in the past but will complete your check-in in the cancer center.  Wear comfortable clothing and clothing appropriate for easy access to any Portacath or PICC line.   We strive to give you quality time with your provider. You may need to reschedule your appointment if you arrive late (15 or more minutes).  Arriving late affects you and other patients whose appointments are after yours.  Also, if you miss three or more appointments without notifying the office, you may be dismissed from the clinic at the provider's discretion.      For prescription refill requests, have your pharmacy contact our office and allow 72 hours for refills to be completed.    Today you received the following chemotherapy and/or immunotherapy agents Enhertu.  Fam-Trastuzumab Deruxtecan Injection What is this medication? FAM-TRASTUZUMAB DERUXTECAN (fam-tras TOOZ eu mab DER ux TEE kan) treats some types of cancer. It works by blocking a protein that causes cancer cells to grow and multiply. This helps to slow or stop the spread of cancer cells. This medicine may be used for other purposes; ask your health care provider or pharmacist if you have questions. COMMON BRAND NAME(S): ENHERTU What should I tell my care team before I take this medication? They need to know if you have any of these conditions: Heart disease Heart failure Infection, especially a viral infection, such as chickenpox, cold sores, or herpes Liver disease Lung or breathing disease, such as asthma or COPD An unusual or allergic reaction to fam-trastuzumab deruxtecan, other medications,  foods, dyes, or preservatives Pregnant or trying to get pregnant Breast-feeding How should I use this medication? This medication is injected into a vein. It is given by your care team in a hospital or clinic setting. A special MedGuide will be given to you before each treatment. Be sure to read this information carefully each time. Talk to your care team about the use of this medication in children. Special care may be needed. Overdosage: If you think you have taken too much of this medicine contact a poison control center or emergency room at once. NOTE: This medicine is only for you. Do not share this medicine with others. What if I miss a dose? It is important not to miss your dose. Call your care team if you are unable to keep an appointment. What may interact with this medication? Interactions are not expected. This list may not describe all possible interactions. Give your health care provider a list of all the medicines, herbs, non-prescription drugs, or dietary supplements you use. Also tell them if you smoke, drink alcohol, or use illegal drugs. Some items may interact with your medicine. What should I watch for while using this medication? Visit your care team for regular checks on your progress. Tell your care team if your symptoms do not start to get better or if they get worse. Your condition will be monitored carefully while you are receiving this medication. Do not become pregnant while taking this medication or for 7 months after stopping it. Women should inform their care team if they wish to become pregnant or think   they might be pregnant. Men should not father a child while taking this medication and for 4 months after stopping it. There is potential for serious side effects to an unborn child. Talk to your care team for more information. Do not breast-feed an infant while taking this medication or for 7 months after the last dose. This medication has caused decreased sperm  counts in some men. This may make it more difficult to father a child. Talk to your care team if you are concerned about your fertility. This medication may increase your risk to bruise or bleed. Call your care team if you notice any unusual bleeding. Be careful brushing or flossing your teeth or using a toothpick because you may get an infection or bleed more easily. If you have any dental work done, tell your dentist you are receiving this medication. This medication may cause dry eyes and blurred vision. If you wear contact lenses, you may feel some discomfort. Lubricating eye drops may help. See your care team if the problem does not go away or is severe. This medication may increase your risk of getting an infection. Call your care team for advice if you get a fever, chills, sore throat, or other symptoms of a cold or flu. Do not treat yourself. Try to avoid being around people who are sick. Avoid taking medications that contain aspirin, acetaminophen, ibuprofen, naproxen, or ketoprofen unless instructed by your care team. These medications may hide a fever. What side effects may I notice from receiving this medication? Side effects that you should report to your care team as soon as possible: Allergic reactions--skin rash, itching, hives, swelling of the face, lips, tongue, or throat Dry cough, shortness of breath or trouble breathing Infection--fever, chills, cough, sore throat, wounds that don't heal, pain or trouble when passing urine, general feeling of discomfort or being unwell Heart failure--shortness of breath, swelling of the ankles, feet, or hands, sudden weight gain, unusual weakness or fatigue Unusual bruising or bleeding Side effects that usually do not require medical attention (report these to your care team if they continue or are bothersome): Constipation Diarrhea Hair loss Muscle pain Nausea Vomiting This list may not describe all possible side effects. Call your doctor  for medical advice about side effects. You may report side effects to FDA at 1-800-FDA-1088. Where should I keep my medication? This medication is given in a hospital or clinic. It will not be stored at home. NOTE: This sheet is a summary. It may not cover all possible information. If you have questions about this medicine, talk to your doctor, pharmacist, or health care provider.  2024 Elsevier/Gold Standard (2021-05-21 00:00:00)        To help prevent nausea and vomiting after your treatment, we encourage you to take your nausea medication as directed.  BELOW ARE SYMPTOMS THAT SHOULD BE REPORTED IMMEDIATELY: *FEVER GREATER THAN 100.4 F (38 C) OR HIGHER *CHILLS OR SWEATING *NAUSEA AND VOMITING THAT IS NOT CONTROLLED WITH YOUR NAUSEA MEDICATION *UNUSUAL SHORTNESS OF BREATH *UNUSUAL BRUISING OR BLEEDING *URINARY PROBLEMS (pain or burning when urinating, or frequent urination) *BOWEL PROBLEMS (unusual diarrhea, constipation, pain near the anus) TENDERNESS IN MOUTH AND THROAT WITH OR WITHOUT PRESENCE OF ULCERS (sore throat, sores in mouth, or a toothache) UNUSUAL RASH, SWELLING OR PAIN  UNUSUAL VAGINAL DISCHARGE OR ITCHING   Items with * indicate a potential emergency and should be followed up as soon as possible or go to the Emergency Department if any problems should occur.    Please show the CHEMOTHERAPY ALERT CARD or IMMUNOTHERAPY ALERT CARD at check-in to the Emergency Department and triage nurse.  Should you have questions after your visit or need to cancel or reschedule your appointment, please contact [facility] [phone]  and follow the prompts.  Office hours are 8:00 a.m. to 4:30 p.m. Monday - Friday. Please note that voicemails left after 4:00 p.m. may not be returned until the following business day.  We are closed weekends and major holidays. You have access to a nurse at all times for urgent questions. Please call the main number to the clinic  [phone] and follow the prompts.  For any non-urgent questions, you may also contact your provider using MyChart. We now offer e-Visits for anyone 18 and older to request care online for non-urgent symptoms. For details visit [email or web address].   Also download the MyChart app! Go to the app store, search "MyChart", open the app, select [facility], and log in with your MyChart username and password.   

## 2023-02-12 NOTE — Progress Notes (Signed)
Patient presents today for Enhertu chemotherapy infusion. Patient is in satisfactory condition with no new complaints voiced.  Vital signs are stable.  Labs reviewed by Dr. Ellin Saba during the office visit and all labs are within treatment parameters.  We will proceed with treatment per MD orders.   Patient tolerated treatment well with no complaints voiced.  Patient left ambulatory in stable condition.  Vital signs stable at discharge.  Follow up as scheduled.

## 2023-02-26 ENCOUNTER — Encounter: Payer: Self-pay | Admitting: Hematology

## 2023-03-04 NOTE — Progress Notes (Signed)
[name] [address] [place] [address]    Clinic Day:  03/05/2023  Referring physician: Toma Deiters, MD  Patient Care Team: Toma Deiters, MD as PCP - General (Internal Medicine) Doreatha Massed, MD as Medical Oncologist (Medical Oncology) Therese Sarah, RN as Oncology Nurse Navigator (Oncology)   ASSESSMENT & PLAN:   Assessment: 1. Her2+ metastatic breast cancer: - She reports feeling a knot in the right breast in July 2022.  She felt the same lump in September which has increased slightly in November.  In the last 3 months, it has increased in size from a quarter to size of an orange. - Mammogram/ultrasound on 10/01/2021: Large irregular mass involving the entire central right breast measuring 4.9 x 2.4 x 4 cm at 12:00.  At least 3 lymph nodes in the right axilla.  Overlying skin thickening and generalized erythema and marked tenderness. - Right breast mass and axillary lymph node biopsy on 10/08/2021: - Pathology: Poorly differentiated ductal carcinoma, grade 3, HER2 3+ positive, ER 5% strong staining intensity, PR negative, Ki-67 25%. - PET scan (11/30/2021): Hypermetabolic tumor involving right breast, metastatic mediastinal and hilar lymphadenopathy, hepatic metastatic disease, diffuse lytic bone lesions. - 4 cycles of THP from 12/03/2021 through 02/25/2022 - Cerebellar mass resection (11/12/2022): Metastatic carcinoma with tumor cells positive for CK7, GATA3, indicating breast primary.  ER and CK20 are negative.  HER2 is 3+. - Enhertu started on 01/01/2023   2. Social/family history: - She is currently living with her father, youngest daughter and sister.  She has 2 daughters ages 74 and 37.  She has worked in Journalist, newspaper previously.  She quit smoking 3 months ago.  Prior to that she smoked a pack a day for 4 to 5 years and less than half pack a day since 2011.  She vapes occasionally at this time. - Paternal great grandmother had  lung cancer.  Paternal aunt had lung cancer at age 29.  Paternal grandmother had breast cancer in her 31s.  Paternal grandfather had colon cancer.  Maternal grandfather had colon cancer.    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 - She has diarrhea for 3 days, 2-3 times per day.  Takes Imodium 1 to 2 tablets daily. - No signs or symptoms of PND or orthopnea.  No dry cough reported. - I reviewed labs today: AST elevated at 46.  Rest of LFTs normal.  Creatinine and measures ALK lites are normal.  CBC grossly normal. - Recommend proceeding with cycle 4 Enhertu today.  RTC 3 weeks for follow-up.  Will plan on repeating CT CAP and tumor markers to evaluate response.  2.  Brain metastasis: - Dexamethasone has been tapered off.  No headaches reported.  3.  Anxiety: - Continue Xanax 0.5 mg at bedtime as needed.  4.  Chest pain, back pain, right ankle pain: - Continue Percocet 7.5/325 every 6 hours as needed.  Last UDS on 02/12/2023 positive for benzodiazepines and opiates.   5.  High risk drug monitoring: - 2D echo on 01/15/2023: EF 60 to 65%.    Orders Placed This Encounter  Procedures   CT CHEST ABDOMEN PELVIS W CONTRAST    Standing Status:   Future    Standing Expiration Date:   03/04/2024    Order Specific Question:   If indicated for the ordered procedure, I authorize the administration of contrast media  per Radiology protocol    Answer:   Yes    Order Specific Question:   Does the patient have a contrast media/X-ray dye allergy?    Answer:   No    Order Specific Question:   Is patient pregnant?    Answer:   No    Order Specific Question:   Preferred imaging location?    Answer:   [facility]    Order Specific Question:   If indicated for the ordered procedure, I authorize the administration of oral contrast media per Radiology protocol    Answer:   Yes      I,Helena R Teague,acting as a scribe for  Doreatha Massed, MD.,have documented all relevant documentation on the behalf of Doreatha Massed, MD,as directed by  Doreatha Massed, MD while in the presence of Doreatha Massed, MD.  I, Doreatha Massed MD, have reviewed the above documentation for accuracy and completeness, and I agree with the above.    Doreatha Massed, MD   7/18/20245:49 PM  CHIEF COMPLAINT:   Diagnosis: Her2+ metastatic right breast cancer    Cancer Staging  HER2-positive carcinoma of right breast [facility]) Staging form: Breast, AJCC 8th Edition - Clinical stage from 10/28/2021: Stage IV (cT4d, cN1, cM1, G3, ER+, PR-, HER2+) - Unsigned    Prior Therapy: DOCEtaxel + Trastuzumab + Pertuzumab (THP) q21d x 4 cycles   Current Therapy:  Enhertu    HISTORY OF PRESENT ILLNESS:   Oncology History  HER2-positive carcinoma of right breast (HCC)  10/28/2021 Initial Diagnosis   Breast cancer, right breast (HCC)   12/03/2021 - 02/25/2022 Chemotherapy   Patient is on Treatment Plan : BREAST DOCEtaxel + Trastuzumab + Pertuzumab (THP) q21d x 8 cycles / Trastuzumab + Pertuzumab q21d x 4 cycles     01/01/2023 -  Chemotherapy   Patient is on Treatment Plan : BREAST METASTATIC Fam-Trastuzumab Deruxtecan-nxki (Enhertu) (5.4) q21d        INTERVAL HISTORY:   Sara [name] is a 47 y.o. female presenting to clinic today for follow up of Her2+ metastatic right breast cancer. She was last seen by me on 02/12/23.  Today, she states that she is doing well overall. Her appetite level is at 50%. Her energy level is at 50%.  PAST MEDICAL HISTORY:   Past Medical History: Past Medical History:  Diagnosis Date   Anxiety    Back pain    Cancer [facility])     Surgical History: Past Surgical History:  Procedure Laterality Date   CHOLECYSTECTOMY     ORTHOPEDIC SURGERY     PORTACATH PLACEMENT Left 11/18/2021   Procedure: INSERTION PORT-A-CATH;  Surgeon: Lucretia Roers, MD;  Location: [facility];  Service: General;   Laterality: Left;   SUBOCCIPITAL CRANIECTOMY CERVICAL LAMINECTOMY N/A 11/12/2022   Procedure: SUBOCCIPITAL CRANIECTOMY FOR RESECTION OF CEREBELLAR MASS;  Surgeon: Donalee Citrin, MD;  Location: [facility] OR;  Service: Neurosurgery;  Laterality: N/A;    Social History: Social History   Socioeconomic History   Marital status: Legally Separated    Spouse name: Not on file   Number of children: Not on file   Years of education: Not on file   Highest education level: Not on file  Occupational History   Not on file  Tobacco Use   Smoking status: Former    Current packs/day: 0.00    Average packs/day: 1 pack/day for 5.0 years (5.0 ttl pk-yrs)    Types: Cigarettes    Start date: 11/08/2016    Quit date: 11/08/2021  Years since quitting: 1.3   Smokeless tobacco: Never  Vaping Use   Vaping status: Never Used  Substance and Sexual Activity   Alcohol use: No    Comment: occasionally   Drug use: No   Sexual activity: Yes    Birth control/protection: None  Other Topics Concern   Not on file  Social History Narrative   Not on file   Social Determinants of Health   Financial Resource Strain: High Risk (12/03/2021)   Overall Financial Resource Strain (CARDIA)    Difficulty of Paying Living Expenses: Very hard  Food Insecurity: No Food Insecurity (11/25/2022)   Hunger Vital Sign    Worried About Running Out of Food in the Last Year: Never true    Ran Out of Food in the Last Year: Never true  Transportation Needs: No Transportation Needs (11/25/2022)   PRAPARE - Administrator, Civil Service (Medical): No    Lack of Transportation (Non-Medical): No  Recent Concern: Transportation Needs - Unmet Transportation Needs (11/11/2022)   PRAPARE - Transportation    Lack of Transportation (Medical): Yes    Lack of Transportation (Non-Medical): Yes  Physical Activity: Sufficiently Active (06/17/2021)   Exercise Vital Sign    Days of Exercise per Week: 5 days    Minutes of Exercise per Session:  30 min  Stress: Stress Concern Present (06/17/2021)   [employer] of Occupational Health - Occupational Stress Questionnaire    Feeling of Stress : Rather much  Social Connections: Socially Isolated (06/17/2021)   Social Connection and Isolation Panel [NHANES]    Frequency of Communication with Friends and Family: Twice a week    Frequency of Social Gatherings with Friends and Family: Never    Attends Religious Services: 1 to 4 times per year    Active Member of [employer] or Organizations: No    Attends Banker Meetings: Never    Marital Status: Divorced  Catering manager Violence: Not At Risk (11/25/2022)   Humiliation, Afraid, Rape, and Kick questionnaire    Fear of Current or Ex-Partner: No    Emotionally Abused: No    Physically Abused: No    Sexually Abused: No    Family History: Family History  Problem Relation Age of Onset   Heart disease Brother    Lung cancer Paternal Grandmother        lung   Cancer Paternal Grandfather    Asthma Daughter    Asthma Daughter     Current Medications:  Current Outpatient Medications:    ALPRAZolam (XANAX) 0.5 MG tablet, TAKE 1 TABLET BY MOUTH AT BEDTIME AS NEEDED FOR ANXIETY, Disp: 30 tablet, Rfl: 0   docusate sodium (COLACE) 100 MG capsule, Take 1 capsule (100 mg total) by mouth 2 (two) times daily., Disp: , Rfl:    furosemide (LASIX) 20 MG tablet, Take 1 tablet (20 mg total) by mouth daily as needed., Disp: 30 tablet, Rfl: 0   oxyCODONE-acetaminophen (PERCOCET) 7.5-325 MG tablet, Take 1 tablet by mouth every 6 (six) hours as needed for severe pain., Disp: 84 tablet, Rfl: 0   pantoprazole (PROTONIX) 20 MG tablet, Take 1 tablet (20 mg total) by mouth daily., Disp: 30 tablet, Rfl: 5   ondansetron (ZOFRAN) 8 MG tablet, TAKE 1 TABLET BY MOUTH EVERY 8 HOURS AS NEEDED FOR NAUSEA FOR VOMITING, Disp: 90 tablet, Rfl: 5   prochlorperazine (COMPAZINE) 10 MG tablet, Take 1 tablet (10 mg total) by mouth every 6 (six) hours as needed  for nausea  or vomiting., Disp: 30 tablet, Rfl: 5 No current facility-administered medications for this visit.  Facility-Administered Medications Ordered in Other Visits:    sodium chloride flush (NS) 0.9 % injection 10 mL, 10 mL, Intracatheter, PRN, Doreatha Massed, MD, 10 mL at 03/05/23 1348   Allergies: Allergies  Allergen Reactions   Hydrocodone Itching, Nausea And Vomiting, Rash and Other (See Comments)    Hallucinations when given vicodin post crani in 2024   Sulfa Antibiotics Nausea And Vomiting   Morphine And Codeine Rash    REVIEW OF SYSTEMS:   Review of Systems  Constitutional:  Negative for chills, fatigue and fever.  HENT:   Negative for lump/mass, mouth sores, nosebleeds, sore throat and trouble swallowing.   Eyes:  Negative for eye problems.  Respiratory:  Negative for cough and shortness of breath.   Cardiovascular:  Negative for chest pain, leg swelling and palpitations.  Gastrointestinal:  Positive for diarrhea, nausea and vomiting. Negative for abdominal pain and constipation.  Genitourinary:  Negative for bladder incontinence, difficulty urinating, dysuria, frequency, hematuria and nocturia.   Musculoskeletal:  Negative for arthralgias, back pain, flank pain, myalgias and neck pain.  Skin:  Negative for itching and rash.  Neurological:  Positive for headaches. Negative for dizziness and numbness.  Hematological:  Does not bruise/bleed easily.  Psychiatric/Behavioral:  Negative for depression, sleep disturbance and suicidal ideas. The patient is not nervous/anxious.   All other systems reviewed and are negative.    VITALS:   There were no vitals taken for this visit.  Wt Readings from Last 3 Encounters:  03/05/23 123 lb 9.6 oz (56.1 kg)  02/12/23 127 lb 12.8 oz (58 kg)  01/22/23 129 lb 9.6 oz (58.8 kg)    There is no height or weight on file to calculate BMI.  Performance status (ECOG): 1 - Symptomatic but completely ambulatory  PHYSICAL EXAM:    Physical Exam Vitals and nursing note reviewed. Exam conducted with a chaperone present.  Constitutional:      Appearance: Normal appearance.  Cardiovascular:     Rate and Rhythm: Normal rate and regular rhythm.     Pulses: Normal pulses.     Heart sounds: Normal heart sounds.  Pulmonary:     Effort: Pulmonary effort is normal.     Breath sounds: Normal breath sounds.  Abdominal:     Palpations: Abdomen is soft. There is no hepatomegaly, splenomegaly or mass.     Tenderness: There is no abdominal tenderness.  Musculoskeletal:     Right lower leg: No edema.     Left lower leg: No edema.  Lymphadenopathy:     Cervical: No cervical adenopathy.     Right cervical: No superficial, deep or posterior cervical adenopathy.    Left cervical: No superficial, deep or posterior cervical adenopathy.     Upper Body:     Right upper body: No supraclavicular or axillary adenopathy.     Left upper body: No supraclavicular or axillary adenopathy.  Neurological:     General: No focal deficit present.     Mental Status: She is alert and oriented to person, place, and time.  Psychiatric:        Mood and Affect: Mood normal.        Behavior: Behavior normal.     LABS:      Latest Ref Rng & Units 03/05/2023   10:34 AM 02/12/2023    9:04 AM 01/22/2023    8:46 AM  CBC  WBC 4.0 - 10.5 K/uL 6.2  8.2  8.4   Hemoglobin 12.0 - 15.0 g/dL 16.1  09.6  9.9   Hematocrit 36.0 - 46.0 % 32.7  33.3  29.9   Platelets 150 - 400 K/uL 262  342  393       Latest Ref Rng & Units 03/05/2023   10:34 AM 02/12/2023    9:04 AM 01/22/2023    8:46 AM  CMP  Glucose 70 - 99 mg/dL 045  89  409   BUN 6 - 20 mg/dL 12  14  14    Creatinine 0.44 - 1.00 mg/dL 8.11  9.14  7.82   Sodium 135 - 145 mmol/L 136  136  131   Potassium 3.5 - 5.1 mmol/L 3.4  3.8  3.1   Chloride 98 - 111 mmol/L 106  103  98   CO2 22 - 32 mmol/L 24  25  23    Calcium 8.9 - 10.3 mg/dL 8.6  8.5  8.1   Total Protein 6.5 - 8.1 g/dL 6.4  6.2  6.1   Total  Bilirubin 0.3 - 1.2 mg/dL 0.3  0.3  0.4   Alkaline Phos 38 - 126 U/L 88  146  159   AST 15 - 41 U/L 46  37  23   ALT 0 - 44 U/L 32  37  26      No results found for: "CEA1", "CEA" / No results found for: "CEA1", "CEA" No results found for: "PSA1" No results found for: "NFA213" No results found for: "CAN125"  No results found for: "TOTALPROTELP", "ALBUMINELP", "A1GS", "A2GS", "BETS", "BETA2SER", "GAMS", "MSPIKE", "SPEI" No results found for: "TIBC", "FERRITIN", "IRONPCTSAT" No results found for: "LDH"   STUDIES:   No results found.

## 2023-03-05 ENCOUNTER — Inpatient Hospital Stay: Payer: MEDICAID

## 2023-03-05 ENCOUNTER — Encounter: Payer: Self-pay | Admitting: Hematology

## 2023-03-05 ENCOUNTER — Inpatient Hospital Stay: Payer: MEDICAID | Attending: Hematology

## 2023-03-05 ENCOUNTER — Inpatient Hospital Stay (HOSPITAL_BASED_OUTPATIENT_CLINIC_OR_DEPARTMENT_OTHER): Payer: MEDICAID | Admitting: Hematology

## 2023-03-05 VITALS — BP 116/79 | HR 89 | Temp 98.1°F | Resp 18 | Ht 64.0 in | Wt 123.6 lb

## 2023-03-05 VITALS — BP 113/77 | HR 75 | Temp 97.9°F | Resp 18

## 2023-03-05 DIAGNOSIS — Z95828 Presence of other vascular implants and grafts: Secondary | ICD-10-CM

## 2023-03-05 DIAGNOSIS — C50911 Malignant neoplasm of unspecified site of right female breast: Secondary | ICD-10-CM

## 2023-03-05 DIAGNOSIS — Z87891 Personal history of nicotine dependence: Secondary | ICD-10-CM | POA: Diagnosis not present

## 2023-03-05 DIAGNOSIS — C773 Secondary and unspecified malignant neoplasm of axilla and upper limb lymph nodes: Secondary | ICD-10-CM | POA: Insufficient documentation

## 2023-03-05 DIAGNOSIS — Z170 Estrogen receptor positive status [ER+]: Secondary | ICD-10-CM | POA: Diagnosis not present

## 2023-03-05 DIAGNOSIS — Z5112 Encounter for antineoplastic immunotherapy: Secondary | ICD-10-CM | POA: Insufficient documentation

## 2023-03-05 DIAGNOSIS — C787 Secondary malignant neoplasm of liver and intrahepatic bile duct: Secondary | ICD-10-CM | POA: Insufficient documentation

## 2023-03-05 DIAGNOSIS — C7951 Secondary malignant neoplasm of bone: Secondary | ICD-10-CM | POA: Insufficient documentation

## 2023-03-05 DIAGNOSIS — C7931 Secondary malignant neoplasm of brain: Secondary | ICD-10-CM | POA: Diagnosis not present

## 2023-03-05 DIAGNOSIS — C50111 Malignant neoplasm of central portion of right female breast: Secondary | ICD-10-CM | POA: Diagnosis not present

## 2023-03-05 LAB — CBC WITH DIFFERENTIAL/PLATELET
Abs Immature Granulocytes: 0.02 10*3/uL (ref 0.00–0.07)
Basophils Absolute: 0 10*3/uL (ref 0.0–0.1)
Basophils Relative: 1 %
Eosinophils Absolute: 0.1 10*3/uL (ref 0.0–0.5)
Eosinophils Relative: 2 %
HCT: 32.7 % — ABNORMAL LOW (ref 36.0–46.0)
Hemoglobin: 10.4 g/dL — ABNORMAL LOW (ref 12.0–15.0)
Immature Granulocytes: 0 %
Lymphocytes Relative: 21 %
Lymphs Abs: 1.3 10*3/uL (ref 0.7–4.0)
MCH: 30.1 pg (ref 26.0–34.0)
MCHC: 31.8 g/dL (ref 30.0–36.0)
MCV: 94.8 fL (ref 80.0–100.0)
Monocytes Absolute: 0.7 10*3/uL (ref 0.1–1.0)
Monocytes Relative: 11 %
Neutro Abs: 4.1 10*3/uL (ref 1.7–7.7)
Neutrophils Relative %: 65 %
Platelets: 262 10*3/uL (ref 150–400)
RBC: 3.45 MIL/uL — ABNORMAL LOW (ref 3.87–5.11)
RDW: 16.5 % — ABNORMAL HIGH (ref 11.5–15.5)
WBC: 6.2 10*3/uL (ref 4.0–10.5)
nRBC: 0 % (ref 0.0–0.2)

## 2023-03-05 LAB — COMPREHENSIVE METABOLIC PANEL
ALT: 32 U/L (ref 0–44)
AST: 46 U/L — ABNORMAL HIGH (ref 15–41)
Albumin: 3.4 g/dL — ABNORMAL LOW (ref 3.5–5.0)
Alkaline Phosphatase: 88 U/L (ref 38–126)
Anion gap: 6 (ref 5–15)
BUN: 12 mg/dL (ref 6–20)
CO2: 24 mmol/L (ref 22–32)
Calcium: 8.6 mg/dL — ABNORMAL LOW (ref 8.9–10.3)
Chloride: 106 mmol/L (ref 98–111)
Creatinine, Ser: 0.76 mg/dL (ref 0.44–1.00)
GFR, Estimated: >60 mL/min (ref >60)
Glucose, Bld: 114 mg/dL — ABNORMAL HIGH (ref 70–99)
Potassium: 3.4 mmol/L — ABNORMAL LOW (ref 3.5–5.1)
Sodium: 136 mmol/L (ref 135–145)
Total Bilirubin: 0.3 mg/dL (ref 0.3–1.2)
Total Protein: 6.4 g/dL — ABNORMAL LOW (ref 6.5–8.1)

## 2023-03-05 LAB — MAGNESIUM: Magnesium: 1.8 mg/dL (ref 1.7–2.4)

## 2023-03-05 MED ORDER — CETIRIZINE HCL 10 MG/ML IV SOLN
10.0000 mg | Freq: Once | INTRAVENOUS | Status: AC
  Administered 2023-03-05: 10 mg via INTRAVENOUS
  Filled 2023-03-05: qty 1

## 2023-03-05 MED ORDER — HEPARIN SOD (PORK) LOCK FLUSH 100 UNIT/ML IV SOLN
500.0000 [IU] | Freq: Once | INTRAVENOUS | Status: AC | PRN
  Administered 2023-03-05: 500 [IU]

## 2023-03-05 MED ORDER — PALONOSETRON HCL INJECTION 0.25 MG/5ML
0.2500 mg | Freq: Once | INTRAVENOUS | Status: AC
  Administered 2023-03-05: 0.25 mg via INTRAVENOUS
  Filled 2023-03-05: qty 5

## 2023-03-05 MED ORDER — POTASSIUM CHLORIDE CRYS ER 20 MEQ PO TBCR
40.0000 meq | EXTENDED_RELEASE_TABLET | Freq: Once | ORAL | Status: AC
  Administered 2023-03-05: 40 meq via ORAL
  Filled 2023-03-05: qty 2

## 2023-03-05 MED ORDER — DEXTROSE 5 % IV SOLN: Freq: Once | INTRAVENOUS | Status: AC

## 2023-03-05 MED ORDER — FAM-TRASTUZUMAB DERUXTECAN-NXKI CHEMO 100 MG IV SOLR
5.2000 mg/kg | Freq: Once | INTRAVENOUS | Status: AC
  Administered 2023-03-05: 300 mg via INTRAVENOUS
  Filled 2023-03-05: qty 15

## 2023-03-05 MED ORDER — ACETAMINOPHEN 325 MG PO TABS
650.0000 mg | ORAL_TABLET | Freq: Once | ORAL | Status: AC
  Administered 2023-03-05: 650 mg via ORAL
  Filled 2023-03-05: qty 2

## 2023-03-05 MED ORDER — SODIUM CHLORIDE 0.9 % IV SOLN
10.0000 mg | Freq: Once | INTRAVENOUS | Status: AC
  Administered 2023-03-05: 10 mg via INTRAVENOUS
  Filled 2023-03-05: qty 10

## 2023-03-05 MED ORDER — SODIUM CHLORIDE 0.9 % IV SOLN
150.0000 mg | Freq: Once | INTRAVENOUS | Status: AC
  Administered 2023-03-05: 150 mg via INTRAVENOUS
  Filled 2023-03-05: qty 5

## 2023-03-05 MED ORDER — SODIUM CHLORIDE 0.9% FLUSH
10.0000 mL | INTRAVENOUS | Status: DC | PRN
  Administered 2023-03-05: 10 mL via INTRAVENOUS

## 2023-03-05 MED ORDER — SODIUM CHLORIDE 0.9% FLUSH
10.0000 mL | INTRAVENOUS | Status: DC | PRN
  Administered 2023-03-05: 10 mL

## 2023-03-05 NOTE — Progress Notes (Signed)
Patients port flushed without difficulty.  Good blood return noted with no bruising or swelling noted at site.  Patient remains accessed for treatment.  

## 2023-03-05 NOTE — Patient Instructions (Signed)

## 2023-03-05 NOTE — Patient Instructions (Signed)
[facility]   Discharge Instructions: Thank you for choosing [facility] to provide your oncology and hematology care.  If you have a lab appointment with the Cancer Center - please note that after April 8th, 2024, all labs will be drawn in the cancer center.  You do not have to check in or register with the main entrance as you have in the past but will complete your check-in in the cancer center.  Wear comfortable clothing and clothing appropriate for easy access to any Portacath or PICC line.   We strive to give you quality time with your provider. You may need to reschedule your appointment if you arrive late (15 or more minutes).  Arriving late affects you and other patients whose appointments are after yours.  Also, if you miss three or more appointments without notifying the office, you may be dismissed from the clinic at the provider's discretion.      For prescription refill requests, have your pharmacy contact our office and allow 72 hours for refills to be completed.    Today you received the following chemotherapy and/or immunotherapy agents Enhertu   To help prevent nausea and vomiting after your treatment, we encourage you to take your nausea medication as directed.  Fam-Trastuzumab Deruxtecan Injection What is this medication? FAM-TRASTUZUMAB DERUXTECAN (fam-tras TOOZ eu mab DER ux TEE kan) treats some types of cancer. It works by blocking a protein that causes cancer cells to grow and multiply. This helps to slow or stop the spread of cancer cells. This medicine may be used for other purposes; ask your health care provider or pharmacist if you have questions. COMMON BRAND NAME(S): ENHERTU What should I tell my care team before I take this medication? They need to know if you have any of these conditions: Heart disease Heart failure Infection, especially a viral infection, such as chickenpox, cold sores, or herpes Liver disease Lung or  breathing disease, such as asthma or COPD An unusual or allergic reaction to fam-trastuzumab deruxtecan, other medications, foods, dyes, or preservatives Pregnant or trying to get pregnant Breast-feeding How should I use this medication? This medication is injected into a vein. It is given by your care team in a hospital or clinic setting. A special MedGuide will be given to you before each treatment. Be sure to read this information carefully each time. Talk to your care team about the use of this medication in children. Special care may be needed. Overdosage: If you think you have taken too much of this medicine contact a poison control center or emergency room at once. NOTE: This medicine is only for you. Do not share this medicine with others. What if I miss a dose? It is important not to miss your dose. Call your care team if you are unable to keep an appointment. What may interact with this medication? Interactions are not expected. This list may not describe all possible interactions. Give your health care provider a list of all the medicines, herbs, non-prescription drugs, or dietary supplements you use. Also tell them if you smoke, drink alcohol, or use illegal drugs. Some items may interact with your medicine. What should I watch for while using this medication? Visit your care team for regular checks on your progress. Tell your care team if your symptoms do not start to get better or if they get worse. Your condition will be monitored carefully while you are receiving this medication. Do not become pregnant while taking this medication   or for 7 months after stopping it. Women should inform their care team if they wish to become pregnant or think they might be pregnant. Men should not father a child while taking this medication and for 4 months after stopping it. There is potential for serious side effects to an unborn child. Talk to your care team for more information. Do not  breast-feed an infant while taking this medication or for 7 months after the last dose. This medication has caused decreased sperm counts in some men. This may make it more difficult to father a child. Talk to your care team if you are concerned about your fertility. This medication may increase your risk to bruise or bleed. Call your care team if you notice any unusual bleeding. Be careful brushing or flossing your teeth or using a toothpick because you may get an infection or bleed more easily. If you have any dental work done, tell your dentist you are receiving this medication. This medication may cause dry eyes and blurred vision. If you wear contact lenses, you may feel some discomfort. Lubricating eye drops may help. See your care team if the problem does not go away or is severe. This medication may increase your risk of getting an infection. Call your care team for advice if you get a fever, chills, sore throat, or other symptoms of a cold or flu. Do not treat yourself. Try to avoid being around people who are sick. Avoid taking medications that contain aspirin, acetaminophen, ibuprofen, naproxen, or ketoprofen unless instructed by your care team. These medications may hide a fever. What side effects may I notice from receiving this medication? Side effects that you should report to your care team as soon as possible: Allergic reactions--skin rash, itching, hives, swelling of the face, lips, tongue, or throat Dry cough, shortness of breath or trouble breathing Infection--fever, chills, cough, sore throat, wounds that don't heal, pain or trouble when passing urine, general feeling of discomfort or being unwell Heart failure--shortness of breath, swelling of the ankles, feet, or hands, sudden weight gain, unusual weakness or fatigue Unusual bruising or bleeding Side effects that usually do not require medical attention (report these to your care team if they continue or are  bothersome): Constipation Diarrhea Hair loss Muscle pain Nausea Vomiting This list may not describe all possible side effects. Call your doctor for medical advice about side effects. You may report side effects to FDA at 1-800-FDA-1088. Where should I keep my medication? This medication is given in a hospital or clinic. It will not be stored at home. NOTE: This sheet is a summary. It may not cover all possible information. If you have questions about this medicine, talk to your doctor, pharmacist, or health care provider.  2024 Elsevier/Gold Standard (2021-05-21 00:00:00)   BELOW ARE SYMPTOMS THAT SHOULD BE REPORTED IMMEDIATELY: *FEVER GREATER THAN 100.4 F (38 C) OR HIGHER *CHILLS OR SWEATING *NAUSEA AND VOMITING THAT IS NOT CONTROLLED WITH YOUR NAUSEA MEDICATION *UNUSUAL SHORTNESS OF BREATH *UNUSUAL BRUISING OR BLEEDING *URINARY PROBLEMS (pain or burning when urinating, or frequent urination) *BOWEL PROBLEMS (unusual diarrhea, constipation, pain near the anus) TENDERNESS IN MOUTH AND THROAT WITH OR WITHOUT PRESENCE OF ULCERS (sore throat, sores in mouth, or a toothache) UNUSUAL RASH, SWELLING OR PAIN  UNUSUAL VAGINAL DISCHARGE OR ITCHING   Items with * indicate a potential emergency and should be followed up as soon as possible or go to the Emergency Department if any problems should occur.  Please show the CHEMOTHERAPY  ALERT CARD or IMMUNOTHERAPY ALERT CARD at check-in to the Emergency Department and triage nurse.  Should you have questions after your visit or need to cancel or reschedule your appointment, please contact [facility] [place] [phone]  and follow the prompts.  Office hours are 8:00 a.m. to 4:30 p.m. Monday - Friday. Please note that voicemails left after 4:00 p.m. may not be returned until the following business day.  We are closed weekends and major holidays. You have access to a nurse at all times for urgent questions. Please call the main number  to the clinic [phone] and follow the prompts.  For any non-urgent questions, you may also contact your provider using MyChart. We now offer e-Visits for anyone 18 and older to request care online for non-urgent symptoms. For details visit [email or web address].   Also download the MyChart app! Go to the app store, search "MyChart", open the app, select [place], and log in with your MyChart username and password.   

## 2023-03-05 NOTE — Progress Notes (Signed)
Patient has been examined by Dr. Katragadda. Vital signs and labs have been reviewed by MD - ANC, Creatinine, LFTs, hemoglobin, and platelets are within treatment parameters per M.D. - pt may proceed with treatment.  Primary RN and pharmacy notified.  

## 2023-03-05 NOTE — Progress Notes (Signed)
Patient presents today for Enhertu infusion. Patient is in satisfactory condition with no new complaints voiced.  Vital signs are stable.  Labs reviewed by Dr. Ellin Saba during the office visit and all labs are within treatment parameters. Pt's potassium is 3.4 today, pt will receive 40 mEq potassium chloride per Dr.K's standing order. We will proceed with treatment per MD orders.   Treatment given today per MD orders. Tolerated infusion without adverse affects. Vital signs stable. No complaints at this time. Discharged from clinic ambulatory in stable condition. Alert and oriented x 3. F/U with [facility] as scheduled.

## 2023-03-06 LAB — CANCER ANTIGEN 27.29: CA 27.29: 14.9 U/mL (ref 0.0–38.6)

## 2023-03-06 LAB — CANCER ANTIGEN 15-3: CA 15-3: 17.5 U/mL (ref 0.0–25.0)

## 2023-03-09 ENCOUNTER — Other Ambulatory Visit: Payer: Self-pay

## 2023-03-09 MED ORDER — LIDOCAINE-PRILOCAINE 2.5-2.5 % EX CREA: 1.0000 | TOPICAL_CREAM | CUTANEOUS | 0 refills | Status: DC | PRN

## 2023-03-09 MED ORDER — OXYCODONE-ACETAMINOPHEN 7.5-325 MG PO TABS: 1.0000 | ORAL_TABLET | Freq: Four times a day (QID) | ORAL | 0 refills | Status: DC | PRN

## 2023-03-11 ENCOUNTER — Encounter: Payer: Self-pay | Admitting: *Deleted

## 2023-03-11 NOTE — Progress Notes (Signed)
Oxycodone/APAP 7.5/325 mg approved by plan until 09/10/23.

## 2023-03-24 ENCOUNTER — Ambulatory Visit (HOSPITAL_COMMUNITY): Admission: RE | Admit: 2023-03-24 | Payer: MEDICAID | Source: Ambulatory Visit

## 2023-03-24 ENCOUNTER — Ambulatory Visit (HOSPITAL_COMMUNITY): Payer: MEDICAID | Attending: Radiation Oncology

## 2023-03-29 NOTE — Progress Notes (Incomplete)
[facility] [address], [place] [address]    Clinic Day:  03/29/23   Referring physician: Toma Deiters, MD  Patient Care Team: Toma Deiters, MD as PCP - General (Internal Medicine) Doreatha Massed, MD as Medical Oncologist (Medical Oncology) Therese Sarah, RN as Oncology Nurse Navigator (Oncology)   ASSESSMENT & PLAN:   Assessment: 1. Her2+ metastatic breast cancer: - She reports feeling a knot in the right breast in July 2022.  She felt the same lump in September which has increased slightly in November.  In the last 3 months, it has increased in size from a quarter to size of an orange. - Mammogram/ultrasound on 10/01/2021: Large irregular mass involving the entire central right breast measuring 4.9 x 2.4 x 4 cm at 12:00.  At least 3 lymph nodes in the right axilla.  Overlying skin thickening and generalized erythema and marked tenderness. - Right breast mass and axillary lymph node biopsy on 10/08/2021: - Pathology: Poorly differentiated ductal carcinoma, grade 3, HER2 3+ positive, ER 5% strong staining intensity, PR negative, Ki-67 25%. - PET scan (11/30/2021): Hypermetabolic tumor involving right breast, metastatic mediastinal and hilar lymphadenopathy, hepatic metastatic disease, diffuse lytic bone lesions. - 4 cycles of THP from 12/03/2021 through 02/25/2022 - Cerebellar mass resection (11/12/2022): Metastatic carcinoma with tumor cells positive for CK7, GATA3, indicating breast primary.  ER and CK20 are negative.  HER2 is 3+. - Enhertu started on 01/01/2023   2. Social/family history: - She is currently living with her father, youngest daughter and sister.  She has 2 daughters ages 41 and 11.  She has worked in Journalist, newspaper previously.  She quit smoking 3 months ago.  Prior to that she smoked a pack a day for 4 to 5 years and less than half pack a day since 2011.  She vapes occasionally at this time. - Paternal great grandmother had  lung cancer.  Paternal aunt had lung cancer at age 55.  Paternal grandmother had breast cancer in her 47s.  Paternal grandfather had colon cancer.  Maternal grandfather had colon cancer.    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 - She has diarrhea for 3 days, 2-3 times per day.  Takes Imodium 1 to 2 tablets daily. - No signs or symptoms of PND or orthopnea.  No dry cough reported. - I reviewed labs today: AST elevated at 46.  Rest of LFTs normal.  Creatinine and measures ALK lites are normal.  CBC grossly normal. - Recommend proceeding with cycle 4 Enhertu today.  RTC 3 weeks for follow-up.  Will plan on repeating CT CAP and tumor markers to evaluate response.  2.  Brain metastasis: - Dexamethasone has been tapered off.  No headaches reported.  3.  Anxiety: - Continue Xanax 0.5 mg at bedtime as needed.  4.  Chest pain, back pain, right ankle pain: - Continue Percocet 7.5/325 every 6 hours as needed.  Last UDS on 02/12/2023 positive for benzodiazepines and opiates.   5.  High risk drug monitoring: - 2D echo on 01/15/2023: EF 60 to 65%.    No orders of the defined types were placed in this encounter.      Alben Deeds Teague,acting as a Neurosurgeon for Doreatha Massed, MD.,have documented all relevant documentation on the behalf of Doreatha Massed, MD,as directed by  Doreatha Massed, MD while in the presence of Doreatha Massed, MD.  ***  Rexford Maus   8/11/20249:13 PM  CHIEF COMPLAINT:   Diagnosis: Her2+ metastatic right breast cancer    Cancer Staging  HER2-positive carcinoma of right breast [facility]) Staging form: Breast, AJCC 8th Edition - Clinical stage from 10/28/2021: Stage IV (cT4d, cN1, cM1, G3, ER+, PR-, HER2+) - Unsigned    Prior Therapy: DOCEtaxel + Trastuzumab + Pertuzumab (THP) q21d x 4 cycles   Current Therapy:  Enhertu    HISTORY OF PRESENT ILLNESS:   Oncology History   HER2-positive carcinoma of right breast (HCC)  10/28/2021 Initial Diagnosis   Breast cancer, right breast (HCC)   12/03/2021 - 02/25/2022 Chemotherapy   Patient is on Treatment Plan : BREAST DOCEtaxel + Trastuzumab + Pertuzumab (THP) q21d x 8 cycles / Trastuzumab + Pertuzumab q21d x 4 cycles     01/01/2023 -  Chemotherapy   Patient is on Treatment Plan : BREAST METASTATIC Fam-Trastuzumab Deruxtecan-nxki (Enhertu) (5.4) q21d        INTERVAL HISTORY:   Sara [name] is a 47 y.o. female presenting to clinic today for follow up of Her2+ metastatic right breast cancer. She was last seen by me on 03/05/23.  Today, she states that she is doing well overall. [name]. Her energy level is at ***%.  PAST MEDICAL HISTORY:   Past Medical History: Past Medical History:  Diagnosis Date   Anxiety    Back pain    Cancer [facility])     Surgical History: Past Surgical History:  Procedure Laterality Date   CHOLECYSTECTOMY     ORTHOPEDIC SURGERY     PORTACATH PLACEMENT Left 11/18/2021   Procedure: INSERTION PORT-A-CATH;  Surgeon: Lucretia Roers, MD;  Location: [facility];  Service: General;  Laterality: Left;   SUBOCCIPITAL CRANIECTOMY CERVICAL LAMINECTOMY N/A 11/12/2022   Procedure: SUBOCCIPITAL CRANIECTOMY FOR RESECTION OF CEREBELLAR MASS;  Surgeon: Donalee Citrin, MD;  Location: [facility] OR;  Service: Neurosurgery;  Laterality: N/A;    Social History: Social History   Socioeconomic History   Marital status: Legally Separated    Spouse name: Not on file   Number of children: Not on file   Years of education: Not on file   Highest education level: Not on file  Occupational History   Not on file  Tobacco Use   Smoking status: Former    Current packs/day: 0.00    Average packs/day: 1 pack/day for 5.0 years (5.0 ttl pk-yrs)    Types: Cigarettes    Start date: 11/08/2016    Quit date: 11/08/2021    Years since quitting: 1.3   Smokeless tobacco: Never  Vaping Use   Vaping status: Never  Used  Substance and Sexual Activity   Alcohol use: No    Comment: occasionally   Drug use: No   Sexual activity: Yes    Birth control/protection: None  Other Topics Concern   Not on file  Social History Narrative   Not on file   Social Determinants of Health   Financial Resource Strain: High Risk (12/03/2021)   Overall Financial Resource Strain (CARDIA)    Difficulty of Paying Living Expenses: Very hard  Food Insecurity: No Food Insecurity (11/25/2022)   Hunger Vital Sign    Worried About Running Out of Food in the Last Year: Never true    Ran Out of Food in the Last Year: Never true  Transportation Needs: No Transportation Needs (11/25/2022)   PRAPARE - Administrator, Civil Service (Medical): No    Lack of Transportation (  Non-Medical): No  Recent Concern: Transportation Needs - Unmet Transportation Needs (11/11/2022)   PRAPARE - Transportation    Lack of Transportation (Medical): Yes    Lack of Transportation (Non-Medical): Yes  Physical Activity: Sufficiently Active (06/17/2021)   Exercise Vital Sign    Days of Exercise per Week: 5 days    Minutes of Exercise per Session: 30 min  Stress: Stress Concern Present (06/17/2021)   [employer] of Occupational Health - Occupational Stress Questionnaire    Feeling of Stress : Rather much  Social Connections: Socially Isolated (06/17/2021)   Social Connection and Isolation Panel [NHANES]    Frequency of Communication with Friends and Family: Twice a week    Frequency of Social Gatherings with Friends and Family: Never    Attends Religious Services: 1 to 4 times per year    Active Member of [employer] or Organizations: No    Attends Banker Meetings: Never    Marital Status: Divorced  Catering manager Violence: Not At Risk (11/25/2022)   Humiliation, Afraid, Rape, and Kick questionnaire    Fear of Current or Ex-Partner: No    Emotionally Abused: No    Physically Abused: No    Sexually Abused: No    Family  History: Family History  Problem Relation Age of Onset   Heart disease Brother    Lung cancer Paternal Grandmother        lung   Cancer Paternal Grandfather    Asthma Daughter    Asthma Daughter     Current Medications:  Current Outpatient Medications:    ALPRAZolam (XANAX) 0.5 MG tablet, TAKE 1 TABLET BY MOUTH AT BEDTIME AS NEEDED FOR ANXIETY, Disp: 30 tablet, Rfl: 0   docusate sodium (COLACE) 100 MG capsule, Take 1 capsule (100 mg total) by mouth 2 (two) times daily., Disp: , Rfl:    furosemide (LASIX) 20 MG tablet, Take 1 tablet (20 mg total) by mouth daily as needed., Disp: 30 tablet, Rfl: 0   lidocaine-prilocaine (EMLA) cream, Apply 1 Application topically as needed., Disp: 30 g, Rfl: 0   ondansetron (ZOFRAN) 8 MG tablet, TAKE 1 TABLET BY MOUTH EVERY 8 HOURS AS NEEDED FOR NAUSEA FOR VOMITING, Disp: 90 tablet, Rfl: 5   oxyCODONE-acetaminophen (PERCOCET) 7.5-325 MG tablet, Take 1 tablet by mouth every 6 (six) hours as needed for severe pain., Disp: 84 tablet, Rfl: 0   pantoprazole (PROTONIX) 20 MG tablet, Take 1 tablet (20 mg total) by mouth daily., Disp: 30 tablet, Rfl: 5   prochlorperazine (COMPAZINE) 10 MG tablet, Take 1 tablet (10 mg total) by mouth every 6 (six) hours as needed for nausea or vomiting., Disp: 30 tablet, Rfl: 5   Allergies: Allergies  Allergen Reactions   Hydrocodone Itching, Nausea And Vomiting, Rash and Other (See Comments)    Hallucinations when given vicodin post crani in 2024   Sulfa Antibiotics Nausea And Vomiting   Morphine And Codeine Rash    REVIEW OF SYSTEMS:   Review of Systems  Constitutional:  Negative for chills, fatigue and fever.  HENT:   Negative for lump/mass, mouth sores, nosebleeds, sore throat and trouble swallowing.   Eyes:  Negative for eye problems.  Respiratory:  Negative for cough and shortness of breath.   Cardiovascular:  Negative for chest pain, leg swelling and palpitations.  Gastrointestinal:  Negative for abdominal pain,  constipation, diarrhea, nausea and vomiting.  Genitourinary:  Negative for bladder incontinence, difficulty urinating, dysuria, frequency, hematuria and nocturia.   Musculoskeletal:  Negative for arthralgias,  back pain, flank pain, myalgias and neck pain.  Skin:  Negative for itching and rash.  Neurological:  Negative for dizziness, headaches and numbness.  Hematological:  Does not bruise/bleed easily.  Psychiatric/Behavioral:  Negative for depression, sleep disturbance and suicidal ideas. The patient is not nervous/anxious.   All other systems reviewed and are negative.    VITALS:   There were no vitals taken for this visit.  Wt Readings from Last 3 Encounters:  03/05/23 123 lb 9.6 oz (56.1 kg)  02/12/23 127 lb 12.8 oz (58 kg)  01/22/23 129 lb 9.6 oz (58.8 kg)    There is no height or weight on file to calculate BMI.  Performance status (ECOG): 1 - Symptomatic but completely ambulatory  PHYSICAL EXAM:   Physical Exam Vitals and nursing note reviewed. Exam conducted with a chaperone present.  Constitutional:      Appearance: Normal appearance.  Cardiovascular:     Rate and Rhythm: Normal rate and regular rhythm.     Pulses: Normal pulses.     Heart sounds: Normal heart sounds.  Pulmonary:     Effort: Pulmonary effort is normal.     Breath sounds: Normal breath sounds.  Abdominal:     Palpations: Abdomen is soft. There is no hepatomegaly, splenomegaly or mass.     Tenderness: There is no abdominal tenderness.  Musculoskeletal:     Right lower leg: No edema.     Left lower leg: No edema.  Lymphadenopathy:     Cervical: No cervical adenopathy.     Right cervical: No superficial, deep or posterior cervical adenopathy.    Left cervical: No superficial, deep or posterior cervical adenopathy.     Upper Body:     Right upper body: No supraclavicular or axillary adenopathy.     Left upper body: No supraclavicular or axillary adenopathy.  Neurological:     General: No focal  deficit present.     Mental Status: She is alert and oriented to person, place, and time.  Psychiatric:        Mood and Affect: Mood normal.        Behavior: Behavior normal.     LABS:      Latest Ref Rng & Units 03/05/2023   10:34 AM 02/12/2023    9:04 AM 01/22/2023    8:46 AM  CBC  WBC 4.0 - 10.5 K/uL 6.2  8.2  8.4   Hemoglobin 12.0 - 15.0 g/dL 13.0  86.5  9.9   Hematocrit 36.0 - 46.0 % 32.7  33.3  29.9   Platelets 150 - 400 K/uL 262  342  393       Latest Ref Rng & Units 03/05/2023   10:34 AM 02/12/2023    9:04 AM 01/22/2023    8:46 AM  CMP  Glucose 70 - 99 mg/dL 784  89  696   BUN 6 - 20 mg/dL 12  14  14    Creatinine 0.44 - 1.00 mg/dL 2.95  2.84  1.32   Sodium 135 - 145 mmol/L 136  136  131   Potassium 3.5 - 5.1 mmol/L 3.4  3.8  3.1   Chloride 98 - 111 mmol/L 106  103  98   CO2 22 - 32 mmol/L 24  25  23    Calcium 8.9 - 10.3 mg/dL 8.6  8.5  8.1   Total Protein 6.5 - 8.1 g/dL 6.4  6.2  6.1   Total Bilirubin 0.3 - 1.2 mg/dL 0.3  0.3  0.4  Alkaline Phos 38 - 126 U/L 88  146  159   AST 15 - 41 U/L 46  37  23   ALT 0 - 44 U/L 32  37  26      No results found for: "CEA1", "CEA" / No results found for: "CEA1", "CEA" No results found for: "PSA1" No results found for: "ZOX096" No results found for: "CAN125"  No results found for: "TOTALPROTELP", "ALBUMINELP", "A1GS", "A2GS", "BETS", "BETA2SER", "GAMS", "MSPIKE", "SPEI" No results found for: "TIBC", "FERRITIN", "IRONPCTSAT" No results found for: "LDH"   STUDIES:   No results found.

## 2023-03-30 ENCOUNTER — Inpatient Hospital Stay: Payer: MEDICAID | Admitting: Hematology

## 2023-03-30 ENCOUNTER — Ambulatory Visit: Payer: Medicaid Other

## 2023-03-30 ENCOUNTER — Inpatient Hospital Stay: Payer: MEDICAID

## 2023-03-31 ENCOUNTER — Telehealth: Payer: Self-pay | Admitting: Radiation Therapy

## 2023-03-31 NOTE — Telephone Encounter (Signed)
Left a voicemail requesting a call back about the missed brain MRI and reschedule of the follow-up with Ashlyn Bruning.   [name] R.T.(R)(T) Radiation Special Procedures Navigator

## 2023-04-01 ENCOUNTER — Ambulatory Visit: Payer: Self-pay | Admitting: Urology

## 2023-04-15 ENCOUNTER — Other Ambulatory Visit: Payer: Self-pay

## 2023-04-16 ENCOUNTER — Telehealth: Payer: Self-pay | Admitting: Dietician

## 2023-04-16 ENCOUNTER — Encounter: Payer: Self-pay | Admitting: Hematology

## 2023-04-16 MED ORDER — OXYCODONE-ACETAMINOPHEN 7.5-325 MG PO TABS: 1.0000 | ORAL_TABLET | Freq: Four times a day (QID) | ORAL | 0 refills | Status: DC | PRN

## 2023-04-16 MED ORDER — ALPRAZOLAM 0.5 MG PO TABS: ORAL_TABLET | ORAL | 0 refills | Status: DC

## 2023-04-16 NOTE — Telephone Encounter (Signed)
Received request from LCSW Ronda Fairly) to contact pt regarding reported wt loss and poor appetite.  Unsuccessful attempts to contact patient via telephone x 2. RD left VM with contact information. Informed Ensure samples + coupons will be left with nursing staff for pick up at 9/3 [facility] appointments.

## 2023-04-21 ENCOUNTER — Inpatient Hospital Stay: Payer: MEDICAID | Admitting: Hematology

## 2023-04-21 ENCOUNTER — Inpatient Hospital Stay: Payer: MEDICAID | Attending: Hematology

## 2023-04-21 ENCOUNTER — Inpatient Hospital Stay: Payer: MEDICAID

## 2023-04-21 DIAGNOSIS — C7931 Secondary malignant neoplasm of brain: Secondary | ICD-10-CM | POA: Insufficient documentation

## 2023-04-21 DIAGNOSIS — Z5112 Encounter for antineoplastic immunotherapy: Secondary | ICD-10-CM | POA: Insufficient documentation

## 2023-04-21 DIAGNOSIS — C7951 Secondary malignant neoplasm of bone: Secondary | ICD-10-CM | POA: Insufficient documentation

## 2023-04-21 DIAGNOSIS — C787 Secondary malignant neoplasm of liver and intrahepatic bile duct: Secondary | ICD-10-CM | POA: Insufficient documentation

## 2023-04-21 DIAGNOSIS — C773 Secondary and unspecified malignant neoplasm of axilla and upper limb lymph nodes: Secondary | ICD-10-CM | POA: Insufficient documentation

## 2023-04-21 DIAGNOSIS — Z171 Estrogen receptor negative status [ER-]: Secondary | ICD-10-CM | POA: Insufficient documentation

## 2023-04-21 DIAGNOSIS — C50111 Malignant neoplasm of central portion of right female breast: Secondary | ICD-10-CM | POA: Insufficient documentation

## 2023-04-21 NOTE — Progress Notes (Incomplete)
[facility] [address] [place] [address]    Clinic Day:  04/21/23   Referring physician: Toma Deiters, MD  Patient Care Team: Toma Deiters, MD as PCP - General (Internal Medicine) Doreatha Massed, MD as Medical Oncologist (Medical Oncology) Therese Sarah, RN as Oncology Nurse Navigator (Oncology)   ASSESSMENT & PLAN:   Assessment: 1. Her2+ metastatic breast cancer: - She reports feeling a knot in the right breast in July 2022.  She felt the same lump in September which has increased slightly in November.  In the last 3 months, it has increased in size from a quarter to size of an orange. - Mammogram/ultrasound on 10/01/2021: Large irregular mass involving the entire central right breast measuring 4.9 x 2.4 x 4 cm at 12:00.  At least 3 lymph nodes in the right axilla.  Overlying skin thickening and generalized erythema and marked tenderness. - Right breast mass and axillary lymph node biopsy on 10/08/2021: - Pathology: Poorly differentiated ductal carcinoma, grade 3, HER2 3+ positive, ER 5% strong staining intensity, PR negative, Ki-67 25%. - PET scan (11/30/2021): Hypermetabolic tumor involving right breast, metastatic mediastinal and hilar lymphadenopathy, hepatic metastatic disease, diffuse lytic bone lesions. - 4 cycles of THP from 12/03/2021 through 02/25/2022 - Cerebellar mass resection (11/12/2022): Metastatic carcinoma with tumor cells positive for CK7, GATA3, indicating breast primary.  ER and CK20 are negative.  HER2 is 3+. - Enhertu started on 01/01/2023   2. Social/family history: - She is currently living with her father, youngest daughter and sister.  She has 2 daughters ages 1 and 55.  She has worked in Journalist, newspaper previously.  She quit smoking 3 months ago.  Prior to that she smoked a pack a day for 4 to 5 years and less than half pack a day since 2011.  She vapes occasionally at this time. - Paternal great grandmother had  lung cancer.  Paternal aunt had lung cancer at age 48.  Paternal grandmother had breast cancer in her 62s.  Paternal grandfather had colon cancer.  Maternal grandfather had colon cancer.    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 - She has diarrhea for 3 days, 2-3 times per day.  Takes Imodium 1 to 2 tablets daily. - No signs or symptoms of PND or orthopnea.  No dry cough reported. - I reviewed labs today: AST elevated at 46.  Rest of LFTs normal.  Creatinine and measures ALK lites are normal.  CBC grossly normal. - Recommend proceeding with cycle 4 Enhertu today.  RTC 3 weeks for follow-up.  Will plan on repeating CT CAP and tumor markers to evaluate response.  2.  Brain metastasis: - Dexamethasone has been tapered off.  No headaches reported.  3.  Anxiety: - Continue Xanax 0.5 mg at bedtime as needed.  4.  Chest pain, back pain, right ankle pain: - Continue Percocet 7.5/325 every 6 hours as needed.  Last UDS on 02/12/2023 positive for benzodiazepines and opiates.   5.  High risk drug monitoring: - 2D echo on 01/15/2023: EF 60 to 65%.    No orders of the defined types were placed in this encounter.     Alben Deeds Teague,acting as a Neurosurgeon for Doreatha Massed, MD.,have documented all relevant documentation on the behalf of Doreatha Massed, MD,as directed by  Doreatha Massed, MD while in the presence of Doreatha Massed, MD.  ***  [address] [place]   9/3/20247:53 AM  CHIEF COMPLAINT:   Diagnosis: Her2+ metastatic right breast cancer    Cancer Staging  HER2-positive carcinoma of right breast [facility]) Staging form: Breast, AJCC 8th Edition - Clinical stage from 10/28/2021: Stage IV (cT4d, cN1, cM1, G3, ER+, PR-, HER2+) - Unsigned    Prior Therapy: DOCEtaxel + Trastuzumab + Pertuzumab (THP) q21d x 4 cycles   Current Therapy:  Enhertu    HISTORY OF PRESENT ILLNESS:   Oncology History   HER2-positive carcinoma of right breast (HCC)  10/28/2021 Initial Diagnosis   Breast cancer, right breast (HCC)   12/03/2021 - 02/25/2022 Chemotherapy   Patient is on Treatment Plan : BREAST DOCEtaxel + Trastuzumab + Pertuzumab (THP) q21d x 8 cycles / Trastuzumab + Pertuzumab q21d x 4 cycles     01/01/2023 -  Chemotherapy   Patient is on Treatment Plan : BREAST METASTATIC Fam-Trastuzumab Deruxtecan-nxki (Enhertu) (5.4) q21d        INTERVAL HISTORY:   Sara [name] is a 47 y.o. female presenting to clinic today for follow up of Her2+ metastatic right breast cancer. She was last seen by me on 03/05/23.  Today, she states that she is doing well overall. [name]. Her energy level is at ***%.  PAST MEDICAL HISTORY:   Past Medical History: Past Medical History:  Diagnosis Date   Anxiety    Back pain    Cancer [facility])     Surgical History: Past Surgical History:  Procedure Laterality Date   CHOLECYSTECTOMY     ORTHOPEDIC SURGERY     PORTACATH PLACEMENT Left 11/18/2021   Procedure: INSERTION PORT-A-CATH;  Surgeon: Lucretia Roers, MD;  Location: [facility];  Service: General;  Laterality: Left;   SUBOCCIPITAL CRANIECTOMY CERVICAL LAMINECTOMY N/A 11/12/2022   Procedure: SUBOCCIPITAL CRANIECTOMY FOR RESECTION OF CEREBELLAR MASS;  Surgeon: Donalee Citrin, MD;  Location: [facility] OR;  Service: Neurosurgery;  Laterality: N/A;    Social History: Social History   Socioeconomic History   Marital status: Legally Separated    Spouse name: Not on file   Number of children: Not on file   Years of education: Not on file   Highest education level: Not on file  Occupational History   Not on file  Tobacco Use   Smoking status: Former    Current packs/day: 0.00    Average packs/day: 1 pack/day for 5.0 years (5.0 ttl pk-yrs)    Types: Cigarettes    Start date: 11/08/2016    Quit date: 11/08/2021    Years since quitting: 1.4   Smokeless tobacco: Never  Vaping Use   Vaping status: Never  Used  Substance and Sexual Activity   Alcohol use: No    Comment: occasionally   Drug use: No   Sexual activity: Yes    Birth control/protection: None  Other Topics Concern   Not on file  Social History Narrative   Not on file   Social Determinants of Health   Financial Resource Strain: High Risk (12/03/2021)   Overall Financial Resource Strain (CARDIA)    Difficulty of Paying Living Expenses: Very hard  Food Insecurity: No Food Insecurity (11/25/2022)   Hunger Vital Sign    Worried About Running Out of Food in the Last Year: Never true    Ran Out of Food in the Last Year: Never true  Transportation Needs: No Transportation Needs (11/25/2022)   PRAPARE - Administrator, Civil Service (Medical): No    Lack of Transportation (  Non-Medical): No  Recent Concern: Transportation Needs - Unmet Transportation Needs (11/11/2022)   PRAPARE - Transportation    Lack of Transportation (Medical): Yes    Lack of Transportation (Non-Medical): Yes  Physical Activity: Sufficiently Active (06/17/2021)   Exercise Vital Sign    Days of Exercise per Week: 5 days    Minutes of Exercise per Session: 30 min  Stress: Stress Concern Present (06/17/2021)   [employer] of Occupational Health - Occupational Stress Questionnaire    Feeling of Stress : Rather much  Social Connections: Socially Isolated (06/17/2021)   Social Connection and Isolation Panel [NHANES]    Frequency of Communication with Friends and Family: Twice a week    Frequency of Social Gatherings with Friends and Family: Never    Attends Religious Services: 1 to 4 times per year    Active Member of [employer] or Organizations: No    Attends Banker Meetings: Never    Marital Status: Divorced  Catering manager Violence: Not At Risk (11/25/2022)   Humiliation, Afraid, Rape, and Kick questionnaire    Fear of Current or Ex-Partner: No    Emotionally Abused: No    Physically Abused: No    Sexually Abused: No    Family  History: Family History  Problem Relation Age of Onset   Heart disease Brother    Lung cancer Paternal Grandmother        lung   Cancer Paternal Grandfather    Asthma Daughter    Asthma Daughter     Current Medications:  Current Outpatient Medications:    ALPRAZolam (XANAX) 0.5 MG tablet, TAKE 1 TABLET BY MOUTH AT BEDTIME AS NEEDED FOR ANXIETY, Disp: 30 tablet, Rfl: 0   docusate sodium (COLACE) 100 MG capsule, Take 1 capsule (100 mg total) by mouth 2 (two) times daily., Disp: , Rfl:    furosemide (LASIX) 20 MG tablet, Take 1 tablet (20 mg total) by mouth daily as needed., Disp: 30 tablet, Rfl: 0   lidocaine-prilocaine (EMLA) cream, Apply 1 Application topically as needed., Disp: 30 g, Rfl: 0   ondansetron (ZOFRAN) 8 MG tablet, TAKE 1 TABLET BY MOUTH EVERY 8 HOURS AS NEEDED FOR NAUSEA FOR VOMITING, Disp: 90 tablet, Rfl: 5   oxyCODONE-acetaminophen (PERCOCET) 7.5-325 MG tablet, Take 1 tablet by mouth every 6 (six) hours as needed for severe pain., Disp: 84 tablet, Rfl: 0   pantoprazole (PROTONIX) 20 MG tablet, Take 1 tablet (20 mg total) by mouth daily., Disp: 30 tablet, Rfl: 5   prochlorperazine (COMPAZINE) 10 MG tablet, Take 1 tablet (10 mg total) by mouth every 6 (six) hours as needed for nausea or vomiting., Disp: 30 tablet, Rfl: 5   Allergies: Allergies  Allergen Reactions   Hydrocodone Itching, Nausea And Vomiting, Rash and Other (See Comments)    Hallucinations when given vicodin post crani in 2024   Sulfa Antibiotics Nausea And Vomiting   Morphine And Codeine Rash    REVIEW OF SYSTEMS:   Review of Systems  Constitutional:  Negative for chills, fatigue and fever.  HENT:   Negative for lump/mass, mouth sores, nosebleeds, sore throat and trouble swallowing.   Eyes:  Negative for eye problems.  Respiratory:  Negative for cough and shortness of breath.   Cardiovascular:  Negative for chest pain, leg swelling and palpitations.  Gastrointestinal:  Negative for abdominal pain,  constipation, diarrhea, nausea and vomiting.  Genitourinary:  Negative for bladder incontinence, difficulty urinating, dysuria, frequency, hematuria and nocturia.   Musculoskeletal:  Negative for arthralgias,  back pain, flank pain, myalgias and neck pain.  Skin:  Negative for itching and rash.  Neurological:  Negative for dizziness, headaches and numbness.  Hematological:  Does not bruise/bleed easily.  Psychiatric/Behavioral:  Negative for depression, sleep disturbance and suicidal ideas. The patient is not nervous/anxious.   All other systems reviewed and are negative.    VITALS:   There were no vitals taken for this visit.  Wt Readings from Last 3 Encounters:  03/05/23 123 lb 9.6 oz (56.1 kg)  02/12/23 127 lb 12.8 oz (58 kg)  01/22/23 129 lb 9.6 oz (58.8 kg)    There is no height or weight on file to calculate BMI.  Performance status (ECOG): 1 - Symptomatic but completely ambulatory  PHYSICAL EXAM:   Physical Exam Vitals and nursing note reviewed. Exam conducted with a chaperone present.  Constitutional:      Appearance: Normal appearance.  Cardiovascular:     Rate and Rhythm: Normal rate and regular rhythm.     Pulses: Normal pulses.     Heart sounds: Normal heart sounds.  Pulmonary:     Effort: Pulmonary effort is normal.     Breath sounds: Normal breath sounds.  Abdominal:     Palpations: Abdomen is soft. There is no hepatomegaly, splenomegaly or mass.     Tenderness: There is no abdominal tenderness.  Musculoskeletal:     Right lower leg: No edema.     Left lower leg: No edema.  Lymphadenopathy:     Cervical: No cervical adenopathy.     Right cervical: No superficial, deep or posterior cervical adenopathy.    Left cervical: No superficial, deep or posterior cervical adenopathy.     Upper Body:     Right upper body: No supraclavicular or axillary adenopathy.     Left upper body: No supraclavicular or axillary adenopathy.  Neurological:     General: No focal  deficit present.     Mental Status: She is alert and oriented to person, place, and time.  Psychiatric:        Mood and Affect: Mood normal.        Behavior: Behavior normal.     LABS:      Latest Ref Rng & Units 03/05/2023   10:34 AM 02/12/2023    9:04 AM 01/22/2023    8:46 AM  CBC  WBC 4.0 - 10.5 K/uL 6.2  8.2  8.4   Hemoglobin 12.0 - 15.0 g/dL 16.1  09.6  9.9   Hematocrit 36.0 - 46.0 % 32.7  33.3  29.9   Platelets 150 - 400 K/uL 262  342  393       Latest Ref Rng & Units 03/05/2023   10:34 AM 02/12/2023    9:04 AM 01/22/2023    8:46 AM  CMP  Glucose 70 - 99 mg/dL 045  89  409   BUN 6 - 20 mg/dL 12  14  14    Creatinine 0.44 - 1.00 mg/dL 8.11  9.14  7.82   Sodium 135 - 145 mmol/L 136  136  131   Potassium 3.5 - 5.1 mmol/L 3.4  3.8  3.1   Chloride 98 - 111 mmol/L 106  103  98   CO2 22 - 32 mmol/L 24  25  23    Calcium 8.9 - 10.3 mg/dL 8.6  8.5  8.1   Total Protein 6.5 - 8.1 g/dL 6.4  6.2  6.1   Total Bilirubin 0.3 - 1.2 mg/dL 0.3  0.3  0.4  Alkaline Phos 38 - 126 U/L 88  146  159   AST 15 - 41 U/L 46  37  23   ALT 0 - 44 U/L 32  37  26      No results found for: "CEA1", "CEA" / No results found for: "CEA1", "CEA" No results found for: "PSA1" No results found for: "ZOX096" No results found for: "CAN125"  No results found for: "TOTALPROTELP", "ALBUMINELP", "A1GS", "A2GS", "BETS", "BETA2SER", "GAMS", "MSPIKE", "SPEI" No results found for: "TIBC", "FERRITIN", "IRONPCTSAT" No results found for: "LDH"   STUDIES:   No results found.

## 2023-04-29 ENCOUNTER — Other Ambulatory Visit: Payer: Self-pay

## 2023-05-01 ENCOUNTER — Ambulatory Visit (HOSPITAL_COMMUNITY): Payer: MEDICAID

## 2023-05-01 ENCOUNTER — Encounter (HOSPITAL_COMMUNITY): Payer: Self-pay

## 2023-05-01 ENCOUNTER — Telehealth: Payer: Self-pay | Admitting: Dietician

## 2023-05-01 NOTE — Telephone Encounter (Signed)
Return call to patient regarding Ensure samples. RD left samples with nursing staff for pt to pick up at [facility] on 9/3. Pt did not show for appointments as scheduled.   Left VM informing pt may pick up samples + coupons at next [facility] appointment. Pt scheduled for infusion on 9/18.

## 2023-05-02 ENCOUNTER — Ambulatory Visit (HOSPITAL_COMMUNITY)
Admission: RE | Admit: 2023-05-02 | Discharge: 2023-05-02 | Disposition: A | Discharge status: Home / Self Care | Payer: MEDICAID | Source: Ambulatory Visit | Attending: Radiation Oncology | Admitting: Radiation Oncology

## 2023-05-02 DIAGNOSIS — C7931 Secondary malignant neoplasm of brain: Secondary | ICD-10-CM | POA: Insufficient documentation

## 2023-05-02 MED ORDER — GADOBUTROL 1 MMOL/ML IV SOLN
6.0000 mL | Freq: Once | INTRAVENOUS | Status: AC | PRN
  Administered 2023-05-02: 6 mL via INTRAVENOUS

## 2023-05-04 ENCOUNTER — Ambulatory Visit
Admission: RE | Admit: 2023-05-04 | Discharge: 2023-05-04 | Disposition: A | Discharge status: Home / Self Care | Payer: MEDICAID | Source: Ambulatory Visit | Attending: Radiology | Admitting: Radiology

## 2023-05-04 DIAGNOSIS — C50911 Malignant neoplasm of unspecified site of right female breast: Secondary | ICD-10-CM

## 2023-05-04 NOTE — Progress Notes (Addendum)
Radiation Oncology         [phone] ________________________________  Name: Sara [name] MRN: [id number]  Date: 05/04/2023  DOB: [birth year]  Follow-Up Visit Note - Conducted via telephone due to current COVID-19 concerns for limiting patient exposure  CC: Hasanaj, Myra Gianotti, MD  Toma Deiters, MD  Diagnosis:   47 yo woman with brain metastases secondary to metastatic breast cancer.     ICD-10-CM   1. Breast cancer metastasized to brain, right [facility])  C50.911    C79.31       Interval Since Last Radiation:  4 months  ==========DELIVERED PLANS==========  First Treatment Date: 2022-12-05 - Last Treatment Date: 2022-12-19   Plan Name: Brain_SRS Site: Brain Technique: SBRT/SRT-IMRT Mode: Photon Dose Per Fraction: 20 Gy Prescribed Dose (Delivered / Prescribed): 20 Gy / 20 Gy Prescribed Fxs (Delivered / Prescribed): 1 / 1   Plan Name: Brain_PstFos Site: Brain Technique: 3D Mode: Photon Dose Per Fraction: 3 Gy Prescribed Dose (Delivered / Prescribed): 30 Gy / 30 Gy Prescribed Fxs (Delivered / Prescribed): 10 / 10  Narrative:  The patient returns today to review her most recent MRI scan. I spoke with the patient to conduct her routine scheduled follow up visit via telephone to spare the patient unnecessary potential exposure in the healthcare setting during the current COVID-19 pandemic.  The patient was notified in advance and gave permission to proceed with this visit format.  Most recent MRI of the brain on 05/02/23 showed a new punctate focus of enhancement in the lateral high right parietal lobe; possible new punctate focus of enhancement in the superior right parietal lobe; interval decrease in size of the medial left parietal lesion; similar punctate focus of enhancement in the right caudate; and similar postoperative changes in the right cerebellum without mass like enhancement.   She continues with Enhertu therapy under the care of Dr. Ellin Saba. She started this on  01/01/2023. She completed her 4th cycle on 03/05/23 and has tolerated this well.   Today she reports to be doing well overall. She endorses some soreness to the backside of her head, where she received radiation treatment. Her hair has thinned in the treatment fields, but it is starting to grow back. She denies any headaches, visual disturbances, extremity weakness, or other concerns today.    ALLERGIES:  is allergic to hydrocodone, sulfa antibiotics, and morphine and codeine.  Meds: Current Outpatient Medications  Medication Sig Dispense Refill   ALPRAZolam (XANAX) 0.5 MG tablet TAKE 1 TABLET BY MOUTH AT BEDTIME AS NEEDED FOR ANXIETY 30 tablet 0   docusate sodium (COLACE) 100 MG capsule Take 1 capsule (100 mg total) by mouth 2 (two) times daily.     furosemide (LASIX) 20 MG tablet Take 1 tablet (20 mg total) by mouth daily as needed. 30 tablet 0   lidocaine-prilocaine (EMLA) cream Apply 1 Application topically as needed. 30 g 0   ondansetron (ZOFRAN) 8 MG tablet TAKE 1 TABLET BY MOUTH EVERY 8 HOURS AS NEEDED FOR NAUSEA FOR VOMITING 90 tablet 5   oxyCODONE-acetaminophen (PERCOCET) 7.5-325 MG tablet Take 1 tablet by mouth every 6 (six) hours as needed for severe pain. 84 tablet 0   pantoprazole (PROTONIX) 20 MG tablet Take 1 tablet (20 mg total) by mouth daily. 30 tablet 5   prochlorperazine (COMPAZINE) 10 MG tablet Take 1 tablet (10 mg total) by mouth every 6 (six) hours as needed for nausea or vomiting. 30 tablet 5   No current facility-administered medications for this  encounter.    Physical Findings: The patient is in no acute distress. Patient is alert and oriented.  vitals were not taken for this visit. Marland Kitchen  Deferred, due to nature of telephone visit.   Lab Findings: Lab Results  Component Value Date   WBC 6.2 03/05/2023   HGB 10.4 (L) 03/05/2023   HCT 32.7 (L) 03/05/2023   PLT 262 03/05/2023    Lab Results  Component Value Date   NA 136 03/05/2023   K 3.4 (L) 03/05/2023    CO2 24 03/05/2023   GLUCOSE 114 (H) 03/05/2023   BUN 12 03/05/2023   CREATININE 0.76 03/05/2023   BILITOT 0.3 03/05/2023   ALKPHOS 88 03/05/2023   AST 46 (H) 03/05/2023   ALT 32 03/05/2023   PROT 6.4 (L) 03/05/2023   ALBUMIN 3.4 (L) 03/05/2023   CALCIUM 8.6 (L) 03/05/2023   ANIONGAP 6 03/05/2023    Radiographic Findings: MR Brain W Wo Contrast  Result Date: 05/04/2023 CLINICAL DATA:  Brain metastases, assess treatment response 3T SRS Protocol EXAM: MRI HEAD WITHOUT AND WITH CONTRAST TECHNIQUE: Multiplanar, multiecho pulse sequences of the brain and surrounding structures were obtained without and with intravenous contrast. CONTRAST:  6mL GADAVIST GADOBUTROL 1 MMOL/ML IV SOLN COMPARISON:  MRI December 03, 2022. FINDINGS: Brain: The right parietal enhancing lesion described on the prior is decreased in size, now 6 x 5 mm on series 1100 image 203. New punctate focus of enhancement in the more lateral right parietal lobe on series 1100, image 202. Possible new punctate focus of enhancement in the or superior right parietal lobe (series 1100, image 223). Similar punctate focus of enhancement in the right caudate. Similar postoperative changes in the right cerebellum without masslike enhancement. Similar herniation of a small milk of cerebellum through the defect. Similar small overlying extra-axial fluid collection laterally. No evidence of acute infarct, acute hemorrhage midline shift, or hydrocephalus. Vascular: Major arterial flow voids are maintained at the skull base. Skull and upper cervical spine: Normal marrow signal. Sinuses/Orbits: Clear sinuses.  No acute orbital findings. IMPRESSION: 1. New punctate focus of enhancement in the lateral high right parietal lobe, suspicious for new metastasis. 2. Possible new punctate focus of enhancement in the or superior right parietal lobe, which could be a new metastasis or small vessel. 3. Interval decrease in size of the more medial left parietal lesion. 4.  Similar punctate focus of enhancement in the right caudate. 5. Similar postoperative changes in the right cerebellum without masslike enhancement. Electronically Signed   By: Feliberto Harts M.D.   On: 05/04/2023 15:08    Impression:  47 yo woman with brain metastases secondary to metastatic breast cancer.  We reviewed her most recent brain MRI today. Imaging unfortunately reveals a new focus of enhancement in the right parietal lobe. She remains asymptomatic at this time. We personally reviewed her imaging and discussed her case at our tumor board this morning. The consensus was to treat the new parietal lesion with stereotactic radiosurgery [facility]). Of note, the report also mentions a possible new punctate focus, also in the right parietal lobe. Dr. Kathrynn Running also recommends treatment to this area with SRS.   We discussed the nature of new brain metastasis and the role radiation plays in treatment today. We reviewed the logistics of treatment along with the benefits, risks, and possible side effects from treatment. Patient expressed understanding of the treatment and would like to proceed.   Plan:  She is scheduled for CT simulation on 05/08/23 and her  treatment on 05/13/23 in collaboration with Dr. Wynetta Emery. We will review a consent form at the time of her simulation. We are arranging transportation to get her to and from her appointments.   She knows to call with any questions in the meantime, we appreciate the opportunity to participate in this patient's care.   Given current concerns for patient exposure during the COVID-19 pandemic, this encounter was conducted via telephone. The patient was notified in advance and was offered a WebEX meeting to allow for face to face communication but unfortunately reported that he/she did not have the appropriate resources/technology to support such a visit and instead preferred to proceed with telephone consult. The patient has given verbal consent for this type of  encounter. The time spent during this encounter was 30 minutes. The attendants for this meeting include Joyice Faster PA-C and patient During the encounter, Maryjane Hurter was located at [facility] Radiation Oncology Department.  Patient was located at home.  We personally spent 30 minutes in this encounter including chart review, reviewing radiological studies, telephone conversation with the patient, entering orders and completing documentation.   _____________________________________    Joyice Faster, PA-C

## 2023-05-06 ENCOUNTER — Other Ambulatory Visit: Payer: Self-pay

## 2023-05-06 ENCOUNTER — Inpatient Hospital Stay: Payer: MEDICAID

## 2023-05-06 VITALS — BP 109/79 | HR 74 | Temp 98.1°F | Resp 18

## 2023-05-06 DIAGNOSIS — C50911 Malignant neoplasm of unspecified site of right female breast: Secondary | ICD-10-CM

## 2023-05-06 DIAGNOSIS — C50111 Malignant neoplasm of central portion of right female breast: Secondary | ICD-10-CM | POA: Diagnosis not present

## 2023-05-06 DIAGNOSIS — Z5112 Encounter for antineoplastic immunotherapy: Secondary | ICD-10-CM | POA: Diagnosis present

## 2023-05-06 DIAGNOSIS — C7931 Secondary malignant neoplasm of brain: Secondary | ICD-10-CM | POA: Diagnosis not present

## 2023-05-06 DIAGNOSIS — Z171 Estrogen receptor negative status [ER-]: Secondary | ICD-10-CM | POA: Diagnosis not present

## 2023-05-06 DIAGNOSIS — C7951 Secondary malignant neoplasm of bone: Secondary | ICD-10-CM | POA: Diagnosis not present

## 2023-05-06 DIAGNOSIS — G893 Neoplasm related pain (acute) (chronic): Secondary | ICD-10-CM

## 2023-05-06 DIAGNOSIS — C787 Secondary malignant neoplasm of liver and intrahepatic bile duct: Secondary | ICD-10-CM | POA: Diagnosis not present

## 2023-05-06 DIAGNOSIS — F419 Anxiety disorder, unspecified: Secondary | ICD-10-CM

## 2023-05-06 DIAGNOSIS — C773 Secondary and unspecified malignant neoplasm of axilla and upper limb lymph nodes: Secondary | ICD-10-CM | POA: Diagnosis not present

## 2023-05-06 LAB — COMPREHENSIVE METABOLIC PANEL WITH GFR
ALT: 29 U/L (ref 0–44)
AST: 40 U/L (ref 15–41)
Albumin: 3.7 g/dL (ref 3.5–5.0)
Alkaline Phosphatase: 67 U/L (ref 38–126)
Anion gap: 10 (ref 5–15)
BUN: 14 mg/dL (ref 6–20)
CO2: 26 mmol/L (ref 22–32)
Calcium: 9 mg/dL (ref 8.9–10.3)
Chloride: 97 mmol/L — ABNORMAL LOW (ref 98–111)
Creatinine, Ser: 0.64 mg/dL (ref 0.44–1.00)
GFR, Estimated: >60 mL/min (ref >60)
Glucose, Bld: 84 mg/dL (ref 70–99)
Potassium: 3.6 mmol/L (ref 3.5–5.1)
Sodium: 133 mmol/L — ABNORMAL LOW (ref 135–145)
Total Bilirubin: 0.5 mg/dL (ref 0.3–1.2)
Total Protein: 6.9 g/dL (ref 6.5–8.1)

## 2023-05-06 LAB — CBC WITH DIFFERENTIAL/PLATELET
Abs Immature Granulocytes: 0.01 10*3/uL (ref 0.00–0.07)
Basophils Absolute: 0 10*3/uL (ref 0.0–0.1)
Basophils Relative: 1 %
Eosinophils Absolute: 0.1 10*3/uL (ref 0.0–0.5)
Eosinophils Relative: 2 %
HCT: 37.2 % (ref 36.0–46.0)
Hemoglobin: 11.9 g/dL — ABNORMAL LOW (ref 12.0–15.0)
Immature Granulocytes: 0 %
Lymphocytes Relative: 21 %
Lymphs Abs: 1.2 10*3/uL (ref 0.7–4.0)
MCH: 31.5 pg (ref 26.0–34.0)
MCHC: 32 g/dL (ref 30.0–36.0)
MCV: 98.4 fL (ref 80.0–100.0)
Monocytes Absolute: 0.7 10*3/uL (ref 0.1–1.0)
Monocytes Relative: 12 %
Neutro Abs: 3.8 10*3/uL (ref 1.7–7.7)
Neutrophils Relative %: 64 %
Platelets: 250 10*3/uL (ref 150–400)
RBC: 3.78 MIL/uL — ABNORMAL LOW (ref 3.87–5.11)
RDW: 13.3 % (ref 11.5–15.5)
WBC: 5.9 10*3/uL (ref 4.0–10.5)
nRBC: 0 % (ref 0.0–0.2)

## 2023-05-06 LAB — MAGNESIUM: Magnesium: 1.9 mg/dL (ref 1.7–2.4)

## 2023-05-06 MED ORDER — SODIUM CHLORIDE 0.9 % IV SOLN
10.0000 mg | Freq: Once | INTRAVENOUS | Status: AC
  Administered 2023-05-06: 10 mg via INTRAVENOUS
  Filled 2023-05-06: qty 10

## 2023-05-06 MED ORDER — SODIUM CHLORIDE 0.9% FLUSH
10.0000 mL | Freq: Once | INTRAVENOUS | Status: AC
  Administered 2023-05-06: 10 mL via INTRAVENOUS

## 2023-05-06 MED ORDER — DEXTROSE 5 % IV SOLN: Freq: Once | INTRAVENOUS | Status: AC

## 2023-05-06 MED ORDER — HEPARIN SOD (PORK) LOCK FLUSH 100 UNIT/ML IV SOLN
500.0000 [IU] | Freq: Once | INTRAVENOUS | Status: AC | PRN
  Administered 2023-05-06: 500 [IU]

## 2023-05-06 MED ORDER — SODIUM CHLORIDE 0.9 % IV SOLN
150.0000 mg | Freq: Once | INTRAVENOUS | Status: AC
  Administered 2023-05-06: 150 mg via INTRAVENOUS
  Filled 2023-05-06: qty 150

## 2023-05-06 MED ORDER — CETIRIZINE HCL 10 MG/ML IV SOLN
10.0000 mg | Freq: Once | INTRAVENOUS | Status: AC
  Administered 2023-05-06: 10 mg via INTRAVENOUS
  Filled 2023-05-06: qty 1

## 2023-05-06 MED ORDER — ALPRAZOLAM 0.5 MG PO TABS: ORAL_TABLET | ORAL | 0 refills | Status: DC

## 2023-05-06 MED ORDER — ACETAMINOPHEN 325 MG PO TABS
650.0000 mg | ORAL_TABLET | Freq: Once | ORAL | Status: AC
  Administered 2023-05-06: 650 mg via ORAL
  Filled 2023-05-06: qty 2

## 2023-05-06 MED ORDER — FAM-TRASTUZUMAB DERUXTECAN-NXKI CHEMO 100 MG IV SOLR
5.4000 mg/kg | Freq: Once | INTRAVENOUS | Status: AC
  Administered 2023-05-06: 300 mg via INTRAVENOUS
  Filled 2023-05-06: qty 15

## 2023-05-06 MED ORDER — SODIUM CHLORIDE 0.9% FLUSH
10.0000 mL | INTRAVENOUS | Status: DC | PRN
  Administered 2023-05-06: 10 mL

## 2023-05-06 MED ORDER — PALONOSETRON HCL INJECTION 0.25 MG/5ML
0.2500 mg | Freq: Once | INTRAVENOUS | Status: AC
  Administered 2023-05-06: 0.25 mg via INTRAVENOUS
  Filled 2023-05-06: qty 5

## 2023-05-06 MED ORDER — OXYCODONE-ACETAMINOPHEN 7.5-325 MG PO TABS: 1.0000 | ORAL_TABLET | Freq: Four times a day (QID) | ORAL | 0 refills | Status: DC | PRN

## 2023-05-06 NOTE — Patient Instructions (Signed)
[facility]  Discharge Instructions: Thank you for choosing [facility] to provide your oncology and hematology care.  If you have a lab appointment with the Cancer Center - please note that after April 8th, 2024, all labs will be drawn in the cancer center.  You do not have to check in or register with the main entrance as you have in the past but will complete your check-in in the cancer center.  Wear comfortable clothing and clothing appropriate for easy access to any Portacath or PICC line.   We strive to give you quality time with your provider. You may need to reschedule your appointment if you arrive late (15 or more minutes).  Arriving late affects you and other patients whose appointments are after yours.  Also, if you miss three or more appointments without notifying the office, you may be dismissed from the clinic at the provider's discretion.      For prescription refill requests, have your pharmacy contact our office and allow 72 hours for refills to be completed.    Today you received the following chemotherapy and/or immunotherapy agents Enhertu.  Fam-Trastuzumab Deruxtecan Injection What is this medication? FAM-TRASTUZUMAB DERUXTECAN (fam-tras TOOZ eu mab DER ux TEE kan) treats some types of cancer. It works by blocking a protein that causes cancer cells to grow and multiply. This helps to slow or stop the spread of cancer cells. This medicine may be used for other purposes; ask your health care provider or pharmacist if you have questions. COMMON BRAND NAME(S): ENHERTU What should I tell my care team before I take this medication? They need to know if you have any of these conditions: Heart disease Heart failure Infection, especially a viral infection, such as chickenpox, cold sores, or herpes Liver disease Lung or breathing disease, such as asthma or COPD An unusual or allergic reaction to fam-trastuzumab deruxtecan, other medications,  foods, dyes, or preservatives Pregnant or trying to get pregnant Breast-feeding How should I use this medication? This medication is injected into a vein. It is given by your care team in a hospital or clinic setting. A special MedGuide will be given to you before each treatment. Be sure to read this information carefully each time. Talk to your care team about the use of this medication in children. Special care may be needed. Overdosage: If you think you have taken too much of this medicine contact a poison control center or emergency room at once. NOTE: This medicine is only for you. Do not share this medicine with others. What if I miss a dose? It is important not to miss your dose. Call your care team if you are unable to keep an appointment. What may interact with this medication? Interactions are not expected. This list may not describe all possible interactions. Give your health care provider a list of all the medicines, herbs, non-prescription drugs, or dietary supplements you use. Also tell them if you smoke, drink alcohol, or use illegal drugs. Some items may interact with your medicine. What should I watch for while using this medication? Visit your care team for regular checks on your progress. Tell your care team if your symptoms do not start to get better or if they get worse. Your condition will be monitored carefully while you are receiving this medication. Do not become pregnant while taking this medication or for 7 months after stopping it. Women should inform their care team if they wish to become pregnant or think  they might be pregnant. Men should not father a child while taking this medication and for 4 months after stopping it. There is potential for serious side effects to an unborn child. Talk to your care team for more information. Do not breast-feed an infant while taking this medication or for 7 months after the last dose. This medication has caused decreased sperm  counts in some men. This may make it more difficult to father a child. Talk to your care team if you are concerned about your fertility. This medication may increase your risk to bruise or bleed. Call your care team if you notice any unusual bleeding. Be careful brushing or flossing your teeth or using a toothpick because you may get an infection or bleed more easily. If you have any dental work done, tell your dentist you are receiving this medication. This medication may cause dry eyes and blurred vision. If you wear contact lenses, you may feel some discomfort. Lubricating eye drops may help. See your care team if the problem does not go away or is severe. This medication may increase your risk of getting an infection. Call your care team for advice if you get a fever, chills, sore throat, or other symptoms of a cold or flu. Do not treat yourself. Try to avoid being around people who are sick. Avoid taking medications that contain aspirin, acetaminophen, ibuprofen, naproxen, or ketoprofen unless instructed by your care team. These medications may hide a fever. What side effects may I notice from receiving this medication? Side effects that you should report to your care team as soon as possible: Allergic reactions--skin rash, itching, hives, swelling of the face, lips, tongue, or throat Dry cough, shortness of breath or trouble breathing Infection--fever, chills, cough, sore throat, wounds that don't heal, pain or trouble when passing urine, general feeling of discomfort or being unwell Heart failure--shortness of breath, swelling of the ankles, feet, or hands, sudden weight gain, unusual weakness or fatigue Unusual bruising or bleeding Side effects that usually do not require medical attention (report these to your care team if they continue or are bothersome): Constipation Diarrhea Hair loss Muscle pain Nausea Vomiting This list may not describe all possible side effects. Call your doctor  for medical advice about side effects. You may report side effects to FDA at 1-800-FDA-1088. Where should I keep my medication? This medication is given in a hospital or clinic. It will not be stored at home. NOTE: This sheet is a summary. It may not cover all possible information. If you have questions about this medicine, talk to your doctor, pharmacist, or health care provider.  2024 Elsevier/Gold Standard (2021-05-21 00:00:00)        To help prevent nausea and vomiting after your treatment, we encourage you to take your nausea medication as directed.  BELOW ARE SYMPTOMS THAT SHOULD BE REPORTED IMMEDIATELY: *FEVER GREATER THAN 100.4 F (38 C) OR HIGHER *CHILLS OR SWEATING *NAUSEA AND VOMITING THAT IS NOT CONTROLLED WITH YOUR NAUSEA MEDICATION *UNUSUAL SHORTNESS OF BREATH *UNUSUAL BRUISING OR BLEEDING *URINARY PROBLEMS (pain or burning when urinating, or frequent urination) *BOWEL PROBLEMS (unusual diarrhea, constipation, pain near the anus) TENDERNESS IN MOUTH AND THROAT WITH OR WITHOUT PRESENCE OF ULCERS (sore throat, sores in mouth, or a toothache) UNUSUAL RASH, SWELLING OR PAIN  UNUSUAL VAGINAL DISCHARGE OR ITCHING   Items with * indicate a potential emergency and should be followed up as soon as possible or go to the Emergency Department if any problems should occur.  Please show the CHEMOTHERAPY ALERT CARD or IMMUNOTHERAPY ALERT CARD at check-in to the Emergency Department and triage nurse.  Should you have questions after your visit or need to cancel or reschedule your appointment, please contact [facility] [phone]  and follow the prompts.  Office hours are 8:00 a.m. to 4:30 p.m. Monday - Friday. Please note that voicemails left after 4:00 p.m. may not be returned until the following business day.  We are closed weekends and major holidays. You have access to a nurse at all times for urgent questions. Please call the main number to the clinic  [phone] and follow the prompts.  For any non-urgent questions, you may also contact your provider using MyChart. We now offer e-Visits for anyone 75 and older to request care online for non-urgent symptoms. For details visit [email or web address].   Also download the MyChart app! Go to the app store, search "MyChart", open the app, select [facility], and log in with your MyChart username and password.

## 2023-05-06 NOTE — Progress Notes (Signed)
Patient presents today for chemotherapy infusion.  Patient is in satisfactory condition with no new complaints voiced.  Vital signs are stable.  Labs reviewed and all labs are within treatment parameters.  We will proceed with treatment per MD orders.    Patient tolerated treatment well with no complaints voiced.  Patient left ambulatory in stable condition.  Vital signs stable at discharge.  Follow up as scheduled.

## 2023-05-08 ENCOUNTER — Ambulatory Visit
Admission: RE | Admit: 2023-05-08 | Discharge: 2023-05-08 | Disposition: A | Discharge status: Home / Self Care | Payer: MEDICAID | Source: Ambulatory Visit | Attending: Radiation Oncology | Admitting: Radiation Oncology

## 2023-05-08 ENCOUNTER — Ambulatory Visit
Admission: RE | Admit: 2023-05-08 | Discharge: 2023-05-08 | Disposition: A | Discharge status: Home / Self Care | Payer: MEDICAID | Source: Ambulatory Visit | Attending: Radiation Oncology

## 2023-05-08 VITALS — BP 103/76 | HR 77 | Temp 97.7°F | Resp 18 | Ht 64.0 in | Wt 119.4 lb

## 2023-05-08 DIAGNOSIS — Z170 Estrogen receptor positive status [ER+]: Secondary | ICD-10-CM | POA: Diagnosis not present

## 2023-05-08 DIAGNOSIS — C50911 Malignant neoplasm of unspecified site of right female breast: Secondary | ICD-10-CM | POA: Diagnosis not present

## 2023-05-08 DIAGNOSIS — Z510 Encounter for antineoplastic radiation therapy: Secondary | ICD-10-CM | POA: Diagnosis present

## 2023-05-08 DIAGNOSIS — C7931 Secondary malignant neoplasm of brain: Secondary | ICD-10-CM | POA: Diagnosis present

## 2023-05-08 DIAGNOSIS — Z95828 Presence of other vascular implants and grafts: Secondary | ICD-10-CM

## 2023-05-08 MED ORDER — SODIUM CHLORIDE 0.9% FLUSH
10.0000 mL | Freq: Once | INTRAVENOUS | Status: AC
  Administered 2023-05-08: 10 mL via INTRAVENOUS

## 2023-05-08 MED ORDER — HEPARIN SOD (PORK) LOCK FLUSH 100 UNIT/ML IV SOLN
500.0000 [IU] | Freq: Once | INTRAVENOUS | Status: AC
  Administered 2023-05-08: 500 [IU] via INTRAVENOUS

## 2023-05-08 NOTE — Progress Notes (Signed)
Has armband been applied?  Yes.    Does patient have an allergy to IV contrast dye?: No.   Has patient ever received premedication for IV contrast dye?: No.   Does patient take metformin?: No.   Date of lab work: May 06, 2023 BUN: 14 CR: 0.64 eGfr: >60  IV site:  Left chest port accessed    Has IV site been added to flowsheet?  Yes.    BP 103/76 (BP Location: Right Arm, Patient Position: Sitting, Cuff Size: Normal)   Pulse 77   Temp 97.7 F (36.5 C)   Resp 18   Ht 5\' 4"  (1.626 m)   Wt 119 lb 6.4 oz (54.2 kg)   SpO2 100%   BMI 20.49 kg/m

## 2023-05-08 NOTE — Progress Notes (Signed)
Radiation Oncology         [phone] ________________________________  Name: Sara [name] MRN: [id number]  Date: 05/08/2023  DOB: [birth year]  SIMULATION AND TREATMENT PLANNING NOTE    ICD-10-CM   1. Breast cancer metastasized to brain, right (HCC)  C50.911    C79.31       DIAGNOSIS:  47 yo woman with brain metastases secondary to metastatic breast cancer.  NARRATIVE:  The patient was brought to the CT Simulation planning suite.  Identity was confirmed.  All relevant records and images related to the planned course of therapy were reviewed.  The patient freely provided informed written consent to proceed with treatment after reviewing the details related to the planned course of therapy. The consent form was witnessed and verified by the simulation staff. Intravenous access was established for contrast administration. Then, the patient was set-up in a stable reproducible supine position for radiation therapy.  A relocatable thermoplastic stereotactic head frame was fabricated for precise immobilization.  CT images were obtained.  Surface markings were placed.  The CT images were loaded into the planning software and fused with the patient's targeting MRI scan.  Then the target and avoidance structures were contoured.  Treatment planning then occurred.  The radiation prescription was entered and confirmed.  I have requested 3D planning  I have requested a DVH of the following structures: Brain stem, brain, left eye, right eye, lenses, optic chiasm, target volumes, uninvolved brain, and normal tissue.    SPECIAL TREATMENT PROCEDURE:  The planned course of therapy using radiation constitutes a special treatment procedure. Special care is required in the management of this patient for the following reasons. This treatment constitutes a Special Treatment Procedure for the following reason: High dose per fraction requiring special monitoring for increased toxicities of treatment including daily  imaging.  The special nature of the planned course of radiotherapy will require increased physician supervision and oversight to ensure patient's safety with optimal treatment outcomes.  This requires extended time and effort.  PLAN:  The patient will receive 20 Gy in 1 fraction to the two new punctate metastases in the lateral right parietal lobe and in the or superior right parietal lobe .  ________________________________  Artist Pais. Kathrynn Running, M.D.

## 2023-05-09 ENCOUNTER — Other Ambulatory Visit: Payer: Self-pay

## 2023-05-11 DIAGNOSIS — Z510 Encounter for antineoplastic radiation therapy: Secondary | ICD-10-CM | POA: Diagnosis not present

## 2023-05-13 ENCOUNTER — Other Ambulatory Visit: Payer: Self-pay

## 2023-05-13 ENCOUNTER — Ambulatory Visit
Admission: RE | Admit: 2023-05-13 | Discharge: 2023-05-13 | Disposition: A | Discharge status: Home / Self Care | Payer: MEDICAID | Source: Ambulatory Visit | Attending: Radiation Oncology | Admitting: Radiation Oncology

## 2023-05-13 DIAGNOSIS — C50911 Malignant neoplasm of unspecified site of right female breast: Secondary | ICD-10-CM

## 2023-05-13 DIAGNOSIS — Z510 Encounter for antineoplastic radiation therapy: Secondary | ICD-10-CM | POA: Diagnosis not present

## 2023-05-13 LAB — RAD ONC ARIA SESSION SUMMARY
Course Elapsed Days: 0
Plan Fractions Treated to Date: 1
Plan Prescribed Dose Per Fraction: 20 Gy
Plan Total Fractions Prescribed: 1
Plan Total Prescribed Dose: 20 Gy
Reference Point Dosage Given to Date: 20 Gy
Reference Point Session Dosage Given: 20 Gy
Session Number: 1

## 2023-05-13 NOTE — Progress Notes (Signed)
Patient to clinical after SRS Brain for 30 minute observation.  Reports headache, nausea/vomiting takes medication, and fatigue.  Denies visual changes skin irritation and ringing in ears.  Speech clear.  Ambulates independently without assist.  Reports having Chemo 3 weeks ago and not sure if hot flashes and not having menstrual cycle for 5 months related.  Vitals: 98.2-84-20-123/99 O2 sat 100% on room air.  Advised not to do anything strenuous for 24 hrs.  No Decadron orders.  Advised to report hot flashes and no menstrual cycle to PCP.  Advised to call [phone] if need.

## 2023-05-14 ENCOUNTER — Telehealth: Payer: Self-pay

## 2023-05-14 ENCOUNTER — Other Ambulatory Visit: Payer: Self-pay | Admitting: Radiation Therapy

## 2023-05-14 ENCOUNTER — Telehealth: Payer: Self-pay | Admitting: Radiation Oncology

## 2023-05-14 ENCOUNTER — Telehealth: Payer: Self-pay | Admitting: Radiation Therapy

## 2023-05-14 ENCOUNTER — Ambulatory Visit: Payer: MEDICAID | Admitting: Radiation Oncology

## 2023-05-14 DIAGNOSIS — C7931 Secondary malignant neoplasm of brain: Secondary | ICD-10-CM

## 2023-05-14 NOTE — Progress Notes (Signed)
Radiation Oncology         [phone] ________________________________  Stereotactic Treatment Procedure Note  Name: Sara [name] MRN: [id number]  Date: 05/13/2023  DOB: [birth year]  SPECIAL TREATMENT PROCEDURE    ICD-10-CM   1. Breast cancer metastasized to brain, right (HCC)  C50.911    C79.31       3D TREATMENT PLANNING AND DOSIMETRY:  The patient's radiation plan was reviewed and approved by neurosurgery and radiation oncology prior to treatment.  It showed 3-dimensional radiation distributions overlaid onto the planning CT/MRI image set.  The [facility] for the target structures as well as the organs at risk were reviewed. The documentation of the 3D plan and dosimetry are filed in the radiation oncology EMR.  NARRATIVE:  Sara [name] was brought to the TrueBeam stereotactic radiation treatment machine and placed supine on the CT couch. The head frame was applied, and the patient was set up for stereotactic radiosurgery.  Neurosurgery was present for the set-up and delivery  SIMULATION VERIFICATION:  In the couch zero-angle position, the patient underwent Exactrac imaging using the Brainlab system with orthogonal KV images.  These were carefully aligned and repeated to confirm treatment position for each of the isocenters.  The Exactrac snap film verification was repeated at each couch angle.  PROCEDURE: Sara [name] received stereotactic radiosurgery to the following targets: The following two targets were treated using one isocenter.    There were treated using 4 Rapid Arc VMAT Beams to a prescription dose of 20 Gy.  ExacTrac registration was performed for each couch angle.  The 100% isodose line was prescribed.  6 MV X-rays were delivered in the flattening filter free beam mode.  STEREOTACTIC TREATMENT MANAGEMENT:  Following delivery, the patient was transported to nursing in stable condition and monitored for possible acute effects.  Vital signs were recorded . The patient  tolerated treatment without significant acute effects, and was discharged to home in stable condition.    PLAN: Follow-up in one month.  ________________________________  Artist Pais. Kathrynn Running, M.D.

## 2023-05-14 NOTE — Radiation Completion Notes (Addendum)
Radiation Oncology         [phone] ________________________________  Name: Sara [name] MRN: [id number]  Date: 05/14/2023  DOB: 14-Nov-1975  Referring Physician: Lia Hopping, M.D. Date of Service: 2023-05-14 Radiation Oncologist: Margaretmary Bayley, M.D. [facility]     RADIATION ONCOLOGY END OF TREATMENT NOTE     Diagnosis: 47 yo woman with brain metastases secondary to metastatic breast cancer.   Intent: Palliative     ==========DELIVERED PLANS==========  First Treatment Date: 2023-05-13 - Last Treatment Date: 2023-05-13   Plan Name: Brain_SRS Site: Brain Technique: SBRT/SRT-IMRT Mode: Photon Dose Per Fraction: 20 Gy Prescribed Dose (Delivered / Prescribed): 20 Gy / 20 Gy Prescribed Fxs (Delivered / Prescribed): 1 / 1     ==========ON TREATMENT VISIT DATES========== 2023-05-13   See weekly On Treatment Notes in Epic for details.  She tolerated the [facility] treatment well with only modest fatigue.  The patient will receive a call in about one month from the radiation oncology department and we will plan to obtain a posttreatment MRI brain scan in December 2024 with a telephone call thereafter to review results and recommendations. She will continue follow up with her medical oncologist, Dr. Ellin Saba, as well.   ------------------------------------------------   Margaretmary Dys, MD [facility]  Radiation Oncology Direct Dial: [phone]  Fax: [phone] [email or web address]  Skype  LinkedIn

## 2023-05-14 NOTE — Telephone Encounter (Signed)
9/26 @ 8:44 am Patient called to requested a prescription for steroids to be sent to [facility] pharmacy in [place] due to having severe headaches this morning.  Patient stated was told to call to our office, if have any headaches.

## 2023-05-14 NOTE — Telephone Encounter (Signed)
Left a detailed message for patient requesting a call back. Included was my contact information and details of upcoming brain MRI on 12/30. I have reached out to [employer] to assist with transportation, but he cannot set up anything more than two weeks in advance.  [name] R.T(R)(T)

## 2023-05-14 NOTE — Telephone Encounter (Signed)
Sara [name] was in yesterday 05/13/2023 for SRS Brain and had some complaints of headache she denied visual changes, and nausea with vomiting she has medication for.  No Decadron orders at this time I told her if her headaches get worse to call us back.  Reports having chemotherapy 3 weeks ago and not sure if hot flashes and not having menstrual cycle for 5 months related.  She denies being pregnant she said she did take home pregnancy test.  Advised to see her PCP about that.  RN informed [employer], PA-C.  Called patient back per Ashlyn to advise she would only start decadron if headaches worsen or become persistent and not relieved with Tylenol or advil.  Since her lesions that were treated are tiny she would not suspect the need for decadron at this time. May possibly be dehydrated and need to increase water intake.

## 2023-05-15 ENCOUNTER — Other Ambulatory Visit: Payer: Self-pay

## 2023-05-15 ENCOUNTER — Other Ambulatory Visit: Payer: Self-pay | Admitting: Urology

## 2023-05-15 ENCOUNTER — Telehealth: Payer: Self-pay

## 2023-05-15 MED ORDER — DEXAMETHASONE 4 MG PO TABS: ORAL_TABLET | ORAL | 0 refills | Status: AC

## 2023-05-15 NOTE — Telephone Encounter (Addendum)
Spoke with patient to inform her of Decadron 4 mg taper script sent to her pharmacy of choice in [place].  Reiterated that she needs to increase fluid intake due to nausea/vomiting.  Sara [name] asking for pain medication.  RN reminded her that she just receive prescription for Percocet on 05/06/2023 she shouldn't be out this soon.  She stated that she didn't feel like she would have enough of pills to last before she sees her doctor on 05/26/2023.  RN reminded her that the steroid medication will help decrease inflammation that may cause headaches.  She was told we do not want her on steroids any longer than absolutely necessary since this can decrease the effectiveness of her Enhertu immunotherapy.  Sara [name] was advised if headaches persist or worsen, she will need to go to the ED immediately for evaluation.  She was appreciative of call but had already spoken to [name] (Special Procedures Navigator).

## 2023-05-15 NOTE — Telephone Encounter (Signed)
Ms. Sara [name] has called back after our last conversation with complaints of severe headaches that's keeping her up at night, double vision, has lost weight, and ringing in the ears.  She completed SRS Brain treatment on 05/13/2023.  She had been drinking more water but headaches continue. She is taking Percocet 7.5-325 mg q6h and said that she has been taking more then advised with little effect.  She said she won't see Dr. Kirtland Bouchard until 05/26/2023 and was told by someone that she may need steroids.  She hoping that will also help her to gain some weight back.  Will inform Ashlyn Bruning, PA-C of call and request.

## 2023-05-19 NOTE — Procedures (Signed)
Name: Sara [name]  MRN: [id number]  Date: 05/13/2023   DOB: [birth year]  Stereotactic Radiosurgery Operative Note  PRE-OPERATIVE DIAGNOSIS:  Solitary Brain Metastasis  POST-OPERATIVE DIAGNOSIS:  Solitary Brain Metastasis  PROCEDURE:  Stereotactic Radiosurgery  SURGEON:  Mariam Dollar, MD  NARRATIVE: The patient underwent a radiation treatment planning session in the radiation oncology simulation suite under the care of the radiation oncology physician and physicist.  I participated closely in the radiation treatment planning afterwards. The patient underwent planning CT which was fused to 3T high resolution MRI with 1 mm axial slices.  These images were fused on the planning system.  We contoured the gross target volumes and subsequently expanded this to yield the Planning Target Volume. I actively participated in the planning process.  I helped to define and review the target contours and also the contours of the optic pathway, eyes, brainstem and selected nearby organs at risk.  All the dose constraints for critical structures were reviewed and compared to AAPM Task Group 101.  The prescription dose conformity was reviewed.  I approved the plan electronically.    Accordingly, Sara [name] was brought to the TrueBeam stereotactic radiation treatment linac and placed in the custom immobilization mask.  The patient was aligned according to the IR fiducial markers with BrainLab Exactrac, then orthogonal x-rays were used in ExacTrac with the 6DOF robotic table and the shifts were made to align the patient  Sara [name] received stereotactic radiosurgery uneventfully.    The detailed description of the procedure is recorded in the radiation oncology procedure note.  I was present for the duration of the procedure.  DISPOSITION:  Following delivery, the patient was transported to nursing in stable condition and monitored for possible acute effects to be discharged to home in stable condition  with follow-up in one month.  Mariam Dollar, MD 05/19/2023 7:31 AM

## 2023-05-19 NOTE — Addendum Note (Signed)
Encounter addended by: Donalee Citrin, MD on: 05/19/2023 7:31 AM  Actions taken: Clinical Note Signed

## 2023-05-25 ENCOUNTER — Other Ambulatory Visit: Payer: Self-pay | Admitting: *Deleted

## 2023-05-25 MED ORDER — PAXLOVID (300/100) 20 X 150 MG & 10 X 100MG PO TBPK: 3.0000 | ORAL_TABLET | Freq: Two times a day (BID) | ORAL | 0 refills | Status: DC

## 2023-05-25 MED ORDER — PAXLOVID (300/100) 20 X 150 MG & 10 X 100MG PO TBPK: 3.0000 | ORAL_TABLET | Freq: Two times a day (BID) | ORAL | 0 refills | Status: AC

## 2023-05-25 NOTE — Telephone Encounter (Signed)
Patient called to cancel her infusion for tomorrow due to positive COVID test.  States she is SOB, Chills and has a cough.  Said she feels like she may need to go to the ER.  Per Dr. Ellin Saba, Paxlovid sent in for her.  Patient aware.

## 2023-05-26 ENCOUNTER — Inpatient Hospital Stay: Payer: MEDICAID

## 2023-05-26 ENCOUNTER — Inpatient Hospital Stay: Payer: MEDICAID | Admitting: Hematology

## 2023-05-27 ENCOUNTER — Other Ambulatory Visit: Payer: Self-pay | Admitting: *Deleted

## 2023-05-27 DIAGNOSIS — Z1731 Human epidermal growth factor receptor 2 positive status: Secondary | ICD-10-CM

## 2023-05-27 DIAGNOSIS — F419 Anxiety disorder, unspecified: Secondary | ICD-10-CM

## 2023-05-27 DIAGNOSIS — G893 Neoplasm related pain (acute) (chronic): Secondary | ICD-10-CM

## 2023-05-28 ENCOUNTER — Telehealth: Payer: Self-pay | Admitting: Radiation Therapy

## 2023-05-28 ENCOUNTER — Other Ambulatory Visit: Payer: Self-pay

## 2023-05-28 ENCOUNTER — Encounter: Payer: Self-pay | Admitting: Hematology

## 2023-05-28 DIAGNOSIS — C50911 Malignant neoplasm of unspecified site of right female breast: Secondary | ICD-10-CM

## 2023-05-28 DIAGNOSIS — G893 Neoplasm related pain (acute) (chronic): Secondary | ICD-10-CM

## 2023-05-28 DIAGNOSIS — F419 Anxiety disorder, unspecified: Secondary | ICD-10-CM

## 2023-05-28 MED ORDER — ALPRAZOLAM 0.5 MG PO TABS: ORAL_TABLET | ORAL | 0 refills | Status: DC

## 2023-05-28 NOTE — Telephone Encounter (Signed)
I called to check in with pt and see how she is feeling. I noticed that her recent Med Onc visit and restaging scans were cancelled and rescheduled, just wanted to know if she is still having significant headaches after the completion of the short course of steroids that was given to her.   Pt did not answer, I left a detailed message including my contact information, requesting a call back.   Sara [name] R.T.(R)(T) Radiation Special Procedures Navigator

## 2023-05-29 ENCOUNTER — Ambulatory Visit (HOSPITAL_COMMUNITY): Payer: MEDICAID

## 2023-06-03 ENCOUNTER — Other Ambulatory Visit: Payer: Self-pay

## 2023-06-03 DIAGNOSIS — F1920 Other psychoactive substance dependence, uncomplicated: Secondary | ICD-10-CM

## 2023-06-04 ENCOUNTER — Ambulatory Visit (HOSPITAL_COMMUNITY)
Admission: RE | Admit: 2023-06-04 | Discharge: 2023-06-04 | Disposition: A | Discharge status: Home / Self Care | Payer: MEDICAID | Source: Ambulatory Visit | Attending: Hematology | Admitting: Hematology

## 2023-06-04 ENCOUNTER — Inpatient Hospital Stay: Payer: MEDICAID | Attending: Hematology

## 2023-06-04 DIAGNOSIS — Z1731 Human epidermal growth factor receptor 2 positive status: Secondary | ICD-10-CM | POA: Insufficient documentation

## 2023-06-04 DIAGNOSIS — C787 Secondary malignant neoplasm of liver and intrahepatic bile duct: Secondary | ICD-10-CM | POA: Insufficient documentation

## 2023-06-04 DIAGNOSIS — C50911 Malignant neoplasm of unspecified site of right female breast: Secondary | ICD-10-CM | POA: Diagnosis present

## 2023-06-04 DIAGNOSIS — Z170 Estrogen receptor positive status [ER+]: Secondary | ICD-10-CM | POA: Insufficient documentation

## 2023-06-04 DIAGNOSIS — C7931 Secondary malignant neoplasm of brain: Secondary | ICD-10-CM | POA: Insufficient documentation

## 2023-06-04 DIAGNOSIS — F1920 Other psychoactive substance dependence, uncomplicated: Secondary | ICD-10-CM

## 2023-06-04 DIAGNOSIS — C7951 Secondary malignant neoplasm of bone: Secondary | ICD-10-CM | POA: Insufficient documentation

## 2023-06-04 DIAGNOSIS — Z5112 Encounter for antineoplastic immunotherapy: Secondary | ICD-10-CM | POA: Insufficient documentation

## 2023-06-04 LAB — RAPID URINE DRUG SCREEN, HOSP PERFORMED
Amphetamines: NOT DETECTED
Barbiturates: NOT DETECTED
Benzodiazepines: POSITIVE — AB
Cocaine: NOT DETECTED
Opiates: NOT DETECTED
Tetrahydrocannabinol: POSITIVE — AB

## 2023-06-04 MED ORDER — IOHEXOL 300 MG/ML  SOLN
100.0000 mL | Freq: Once | INTRAMUSCULAR | Status: AC | PRN
  Administered 2023-06-04: 100 mL via INTRAVENOUS

## 2023-06-04 MED ORDER — IOHEXOL 9 MG/ML PO SOLN
ORAL | Status: AC
  Filled 2023-06-04: qty 500

## 2023-06-05 ENCOUNTER — Other Ambulatory Visit: Payer: Self-pay

## 2023-06-05 DIAGNOSIS — Z1731 Malignant neoplasm of unspecified site of right female breast: Secondary | ICD-10-CM

## 2023-06-05 DIAGNOSIS — G893 Neoplasm related pain (acute) (chronic): Secondary | ICD-10-CM

## 2023-06-05 DIAGNOSIS — F419 Anxiety disorder, unspecified: Secondary | ICD-10-CM

## 2023-06-05 MED ORDER — OXYCODONE-ACETAMINOPHEN 7.5-325 MG PO TABS: 1.0000 | ORAL_TABLET | Freq: Four times a day (QID) | ORAL | 0 refills | Status: DC | PRN

## 2023-06-10 ENCOUNTER — Inpatient Hospital Stay: Payer: MEDICAID

## 2023-06-10 ENCOUNTER — Inpatient Hospital Stay (HOSPITAL_BASED_OUTPATIENT_CLINIC_OR_DEPARTMENT_OTHER): Payer: MEDICAID | Admitting: Hematology

## 2023-06-10 ENCOUNTER — Other Ambulatory Visit: Payer: Self-pay

## 2023-06-10 VITALS — BP 135/85 | HR 104 | Temp 99.6°F | Resp 20

## 2023-06-10 DIAGNOSIS — Z5112 Encounter for antineoplastic immunotherapy: Secondary | ICD-10-CM | POA: Diagnosis present

## 2023-06-10 DIAGNOSIS — F419 Anxiety disorder, unspecified: Secondary | ICD-10-CM

## 2023-06-10 DIAGNOSIS — F1920 Other psychoactive substance dependence, uncomplicated: Secondary | ICD-10-CM

## 2023-06-10 DIAGNOSIS — C7931 Secondary malignant neoplasm of brain: Secondary | ICD-10-CM | POA: Diagnosis not present

## 2023-06-10 DIAGNOSIS — C50911 Malignant neoplasm of unspecified site of right female breast: Secondary | ICD-10-CM | POA: Diagnosis not present

## 2023-06-10 DIAGNOSIS — Z170 Estrogen receptor positive status [ER+]: Secondary | ICD-10-CM | POA: Diagnosis not present

## 2023-06-10 DIAGNOSIS — G893 Neoplasm related pain (acute) (chronic): Secondary | ICD-10-CM

## 2023-06-10 DIAGNOSIS — C787 Secondary malignant neoplasm of liver and intrahepatic bile duct: Secondary | ICD-10-CM | POA: Diagnosis not present

## 2023-06-10 DIAGNOSIS — Z1731 Human epidermal growth factor receptor 2 positive status: Secondary | ICD-10-CM | POA: Diagnosis not present

## 2023-06-10 DIAGNOSIS — C7951 Secondary malignant neoplasm of bone: Secondary | ICD-10-CM | POA: Diagnosis not present

## 2023-06-10 LAB — COMPREHENSIVE METABOLIC PANEL
ALT: 26 U/L (ref 0–44)
AST: 28 U/L (ref 15–41)
Albumin: 3.7 g/dL (ref 3.5–5.0)
Alkaline Phosphatase: 69 U/L (ref 38–126)
Anion gap: 9 (ref 5–15)
BUN: 20 mg/dL (ref 6–20)
CO2: 26 mmol/L (ref 22–32)
Calcium: 9.1 mg/dL (ref 8.9–10.3)
Chloride: 101 mmol/L (ref 98–111)
Creatinine, Ser: 0.63 mg/dL (ref 0.44–1.00)
GFR, Estimated: >60 mL/min (ref >60)
Glucose, Bld: 108 mg/dL — ABNORMAL HIGH (ref 70–99)
Potassium: 3.6 mmol/L (ref 3.5–5.1)
Sodium: 136 mmol/L (ref 135–145)
Total Bilirubin: 0.3 mg/dL (ref 0.3–1.2)
Total Protein: 7.2 g/dL (ref 6.5–8.1)

## 2023-06-10 LAB — CBC WITH DIFFERENTIAL/PLATELET
Abs Immature Granulocytes: 0.02 10*3/uL (ref 0.00–0.07)
Basophils Absolute: 0 10*3/uL (ref 0.0–0.1)
Basophils Relative: 0 %
Eosinophils Absolute: 0 10*3/uL (ref 0.0–0.5)
Eosinophils Relative: 0 %
HCT: 37.1 % (ref 36.0–46.0)
Hemoglobin: 11.9 g/dL — ABNORMAL LOW (ref 12.0–15.0)
Immature Granulocytes: 0 %
Lymphocytes Relative: 10 %
Lymphs Abs: 0.9 10*3/uL (ref 0.7–4.0)
MCH: 31.6 pg (ref 26.0–34.0)
MCHC: 32.1 g/dL (ref 30.0–36.0)
MCV: 98.4 fL (ref 80.0–100.0)
Monocytes Absolute: 0.6 10*3/uL (ref 0.1–1.0)
Monocytes Relative: 7 %
Neutro Abs: 7.4 10*3/uL (ref 1.7–7.7)
Neutrophils Relative %: 83 %
Platelets: 144 10*3/uL — ABNORMAL LOW (ref 150–400)
RBC: 3.77 MIL/uL — ABNORMAL LOW (ref 3.87–5.11)
RDW: 14.1 % (ref 11.5–15.5)
WBC: 9 10*3/uL (ref 4.0–10.5)
nRBC: 0 % (ref 0.0–0.2)

## 2023-06-10 LAB — MAGNESIUM: Magnesium: 1.8 mg/dL (ref 1.7–2.4)

## 2023-06-10 LAB — RAPID URINE DRUG SCREEN, HOSP PERFORMED
Amphetamines: NOT DETECTED
Barbiturates: NOT DETECTED
Benzodiazepines: POSITIVE — AB
Cocaine: NOT DETECTED
Opiates: NOT DETECTED
Tetrahydrocannabinol: NOT DETECTED

## 2023-06-10 MED ORDER — SODIUM CHLORIDE 0.9 % IV SOLN: 10.0000 mg | Freq: Once | INTRAVENOUS | Status: DC

## 2023-06-10 MED ORDER — PALONOSETRON HCL INJECTION 0.25 MG/5ML
0.2500 mg | Freq: Once | INTRAVENOUS | Status: AC
Start: 2023-06-10 — End: 2023-06-10
  Administered 2023-06-10: 0.25 mg via INTRAVENOUS
  Filled 2023-06-10: qty 5

## 2023-06-10 MED ORDER — DEXAMETHASONE SODIUM PHOSPHATE 10 MG/ML IJ SOLN
10.0000 mg | Freq: Once | INTRAMUSCULAR | Status: AC
Start: 2023-06-10 — End: 2023-06-10
  Administered 2023-06-10: 10 mg via INTRAVENOUS
  Filled 2023-06-10: qty 1

## 2023-06-10 MED ORDER — HEPARIN SOD (PORK) LOCK FLUSH 100 UNIT/ML IV SOLN
500.0000 [IU] | Freq: Once | INTRAVENOUS | Status: AC | PRN
  Administered 2023-06-10: 500 [IU]

## 2023-06-10 MED ORDER — FAM-TRASTUZUMAB DERUXTECAN-NXKI CHEMO 100 MG IV SOLR
5.4000 mg/kg | Freq: Once | INTRAVENOUS | Status: AC
  Administered 2023-06-10: 300 mg via INTRAVENOUS
  Filled 2023-06-10: qty 15

## 2023-06-10 MED ORDER — ACETAMINOPHEN 325 MG PO TABS
650.0000 mg | ORAL_TABLET | Freq: Once | ORAL | Status: AC
  Administered 2023-06-10: 650 mg via ORAL
  Filled 2023-06-10: qty 2

## 2023-06-10 MED ORDER — HEPARIN SOD (PORK) LOCK FLUSH 100 UNIT/ML IV SOLN
500.0000 [IU] | Freq: Once | INTRAVENOUS | Status: AC
  Administered 2023-06-10: 500 [IU] via INTRAVENOUS

## 2023-06-10 MED ORDER — SODIUM CHLORIDE 0.9% FLUSH
10.0000 mL | INTRAVENOUS | Status: DC | PRN
  Administered 2023-06-10: 10 mL

## 2023-06-10 MED ORDER — SODIUM CHLORIDE 0.9 % IV SOLN
150.0000 mg | Freq: Once | INTRAVENOUS | Status: AC
  Administered 2023-06-10: 150 mg via INTRAVENOUS
  Filled 2023-06-10: qty 5

## 2023-06-10 MED ORDER — CETIRIZINE HCL 10 MG/ML IV SOLN
10.0000 mg | Freq: Once | INTRAVENOUS | Status: AC
Start: 2023-06-10 — End: 2023-06-10
  Administered 2023-06-10: 10 mg via INTRAVENOUS
  Filled 2023-06-10: qty 1

## 2023-06-10 MED ORDER — LIDOCAINE-PRILOCAINE 2.5-2.5 % EX CREA: 1.0000 | TOPICAL_CREAM | CUTANEOUS | 0 refills | Status: DC | PRN

## 2023-06-10 MED ORDER — DEXTROSE 5 % IV SOLN: Freq: Once | INTRAVENOUS | Status: AC

## 2023-06-10 MED ORDER — MIRTAZAPINE 15 MG PO TABS: 15.0000 mg | ORAL_TABLET | Freq: Every day | ORAL | 2 refills | Status: DC

## 2023-06-10 MED ORDER — SODIUM CHLORIDE 0.9% FLUSH
10.0000 mL | Freq: Once | INTRAVENOUS | Status: AC
  Administered 2023-06-10: 10 mL via INTRAVENOUS

## 2023-06-10 NOTE — Progress Notes (Signed)
OK to treat with HR per Dr Carilyn Goodpasture, PharmD

## 2023-06-10 NOTE — Progress Notes (Signed)
Patients port flushed without difficulty.  Good blood return noted with no bruising or swelling noted at site.  Band aid applied.  VSS with discharge and left in satisfactory condition with no s/s of distress noted.   

## 2023-06-10 NOTE — Patient Instructions (Signed)
[facility]  Discharge Instructions: Thank you for choosing [facility] to provide your oncology and hematology care.  If you have a lab appointment with the Cancer Center - please note that after April 8th, 2024, all labs will be drawn in the cancer center.  You do not have to check in or register with the main entrance as you have in the past but will complete your check-in in the cancer center.  Wear comfortable clothing and clothing appropriate for easy access to any Portacath or PICC line.   We strive to give you quality time with your provider. You may need to reschedule your appointment if you arrive late (15 or more minutes).  Arriving late affects you and other patients whose appointments are after yours.  Also, if you miss three or more appointments without notifying the office, you may be dismissed from the clinic at the provider's discretion.      For prescription refill requests, have your pharmacy contact our office and allow 72 hours for refills to be completed.    Today you received the following chemotherapy and/or immunotherapy agents Enhertu.  Fam-Trastuzumab Deruxtecan Injection What is this medication? FAM-TRASTUZUMAB DERUXTECAN (fam-tras TOOZ eu mab DER ux TEE kan) treats some types of cancer. It works by blocking a protein that causes cancer cells to grow and multiply. This helps to slow or stop the spread of cancer cells. This medicine may be used for other purposes; ask your health care provider or pharmacist if you have questions. COMMON BRAND NAME(S): ENHERTU What should I tell my care team before I take this medication? They need to know if you have any of these conditions: Heart disease Heart failure Infection, especially a viral infection, such as chickenpox, cold sores, or herpes Liver disease Lung or breathing disease, such as asthma or COPD An unusual or allergic reaction to fam-trastuzumab deruxtecan, other medications,  foods, dyes, or preservatives Pregnant or trying to get pregnant Breast-feeding How should I use this medication? This medication is injected into a vein. It is given by your care team in a hospital or clinic setting. A special MedGuide will be given to you before each treatment. Be sure to read this information carefully each time. Talk to your care team about the use of this medication in children. Special care may be needed. Overdosage: If you think you have taken too much of this medicine contact a poison control center or emergency room at once. NOTE: This medicine is only for you. Do not share this medicine with others. What if I miss a dose? It is important not to miss your dose. Call your care team if you are unable to keep an appointment. What may interact with this medication? Interactions are not expected. This list may not describe all possible interactions. Give your health care provider a list of all the medicines, herbs, non-prescription drugs, or dietary supplements you use. Also tell them if you smoke, drink alcohol, or use illegal drugs. Some items may interact with your medicine. What should I watch for while using this medication? Visit your care team for regular checks on your progress. Tell your care team if your symptoms do not start to get better or if they get worse. Your condition will be monitored carefully while you are receiving this medication. Do not become pregnant while taking this medication or for 7 months after stopping it. Women should inform their care team if they wish to become pregnant or think  they might be pregnant. Men should not father a child while taking this medication and for 4 months after stopping it. There is potential for serious side effects to an unborn child. Talk to your care team for more information. Do not breast-feed an infant while taking this medication or for 7 months after the last dose. This medication has caused decreased sperm  counts in some men. This may make it more difficult to father a child. Talk to your care team if you are concerned about your fertility. This medication may increase your risk to bruise or bleed. Call your care team if you notice any unusual bleeding. Be careful brushing or flossing your teeth or using a toothpick because you may get an infection or bleed more easily. If you have any dental work done, tell your dentist you are receiving this medication. This medication may cause dry eyes and blurred vision. If you wear contact lenses, you may feel some discomfort. Lubricating eye drops may help. See your care team if the problem does not go away or is severe. This medication may increase your risk of getting an infection. Call your care team for advice if you get a fever, chills, sore throat, or other symptoms of a cold or flu. Do not treat yourself. Try to avoid being around people who are sick. Avoid taking medications that contain aspirin, acetaminophen, ibuprofen, naproxen, or ketoprofen unless instructed by your care team. These medications may hide a fever. What side effects may I notice from receiving this medication? Side effects that you should report to your care team as soon as possible: Allergic reactions--skin rash, itching, hives, swelling of the face, lips, tongue, or throat Dry cough, shortness of breath or trouble breathing Infection--fever, chills, cough, sore throat, wounds that don't heal, pain or trouble when passing urine, general feeling of discomfort or being unwell Heart failure--shortness of breath, swelling of the ankles, feet, or hands, sudden weight gain, unusual weakness or fatigue Unusual bruising or bleeding Side effects that usually do not require medical attention (report these to your care team if they continue or are bothersome): Constipation Diarrhea Hair loss Muscle pain Nausea Vomiting This list may not describe all possible side effects. Call your doctor  for medical advice about side effects. You may report side effects to FDA at 1-800-FDA-1088. Where should I keep my medication? This medication is given in a hospital or clinic. It will not be stored at home. NOTE: This sheet is a summary. It may not cover all possible information. If you have questions about this medicine, talk to your doctor, pharmacist, or health care provider.  2024 Elsevier/Gold Standard (2021-05-21 00:00:00)        To help prevent nausea and vomiting after your treatment, we encourage you to take your nausea medication as directed.  BELOW ARE SYMPTOMS THAT SHOULD BE REPORTED IMMEDIATELY: *FEVER GREATER THAN 100.4 F (38 C) OR HIGHER *CHILLS OR SWEATING *NAUSEA AND VOMITING THAT IS NOT CONTROLLED WITH YOUR NAUSEA MEDICATION *UNUSUAL SHORTNESS OF BREATH *UNUSUAL BRUISING OR BLEEDING *URINARY PROBLEMS (pain or burning when urinating, or frequent urination) *BOWEL PROBLEMS (unusual diarrhea, constipation, pain near the anus) TENDERNESS IN MOUTH AND THROAT WITH OR WITHOUT PRESENCE OF ULCERS (sore throat, sores in mouth, or a toothache) UNUSUAL RASH, SWELLING OR PAIN  UNUSUAL VAGINAL DISCHARGE OR ITCHING   Items with * indicate a potential emergency and should be followed up as soon as possible or go to the Emergency Department if any problems should occur.  Please show the CHEMOTHERAPY ALERT CARD or IMMUNOTHERAPY ALERT CARD at check-in to the Emergency Department and triage nurse.  Should you have questions after your visit or need to cancel or reschedule your appointment, please contact [facility] [phone]  and follow the prompts.  Office hours are 8:00 a.m. to 4:30 p.m. Monday - Friday. Please note that voicemails left after 4:00 p.m. may not be returned until the following business day.  We are closed weekends and major holidays. You have access to a nurse at all times for urgent questions. Please call the main number to the clinic  [phone] and follow the prompts.  For any non-urgent questions, you may also contact your provider using MyChart. We now offer e-Visits for anyone 75 and older to request care online for non-urgent symptoms. For details visit [email or web address].   Also download the MyChart app! Go to the app store, search "MyChart", open the app, select [facility], and log in with your MyChart username and password.

## 2023-06-10 NOTE — Progress Notes (Signed)
[facility] [address] [place] [address]    Clinic Day:  06/10/2023  Referring physician: Toma Deiters, MD  Patient Care Team: Toma Deiters, MD as PCP - General (Internal Medicine) Doreatha Massed, MD as Medical Oncologist (Medical Oncology) Therese Sarah, RN as Oncology Nurse Navigator (Oncology)   ASSESSMENT & PLAN:   Assessment: 1. Her2+ metastatic breast cancer: - She reports feeling a knot in the right breast in July 2022.  She felt the same lump in September which has increased slightly in November.  In the last 3 months, it has increased in size from a quarter to size of an orange. - Mammogram/ultrasound on 10/01/2021: Large irregular mass involving the entire central right breast measuring 4.9 x 2.4 x 4 cm at 12:00.  At least 3 lymph nodes in the right axilla.  Overlying skin thickening and generalized erythema and marked tenderness. - Right breast mass and axillary lymph node biopsy on 10/08/2021: - Pathology: Poorly differentiated ductal carcinoma, grade 3, HER2 3+ positive, ER 5% strong staining intensity, PR negative, Ki-67 25%. - PET scan (11/30/2021): Hypermetabolic tumor involving right breast, metastatic mediastinal and hilar lymphadenopathy, hepatic metastatic disease, diffuse lytic bone lesions. - 4 cycles of THP from 12/03/2021 through 02/25/2022 - Cerebellar mass resection (11/12/2022): Metastatic carcinoma with tumor cells positive for CK7, GATA3, indicating breast primary.  ER and CK20 are negative.  HER2 is 3+. - Enhertu started on 01/01/2023   2. Social/family history: - She is currently living with her father, youngest daughter and sister.  She has 2 daughters ages 21 and 59.  She has worked in Journalist, newspaper previously.  She quit smoking 3 months ago.  Prior to that she smoked a pack a day for 4 to 5 years and less than half pack a day since 2011.  She vapes occasionally at this time. - Paternal great grandmother had  lung cancer.  Paternal aunt had lung cancer at age 73.  Paternal grandmother had breast cancer in her 10s.  Paternal grandfather had colon cancer.  Maternal grandfather had colon cancer.    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  She reports diarrhea which lasts about 3 to 4 days after each treatment. - CT CAP (06/04/2023): Improvement in lung lesions.  Improvement in liver lesions.  Also improvement in bone lesions.  She has incidental appendiceal mucocele. - She does not have any abdominal pain. - MRI brain from 05/02/2023: New punctate focus of enhancement in the lateral high right parietal lobe and possible new punctate focus of enhancement in the superior right parietal lobe. - Labs today with normal LFTs.  CBC was normal. - She may proceed with her treatment today.  RTC 3 weeks for follow-up.  2.  Difficulty falling asleep: - She reports difficulty falling asleep.  Will start her on mirtazapine 15 mg at bedtime.  She reports that it has worked well in the past.  3.  Anxiety: - Continue Xanax 0.5 mg daily as needed.  4.  Chest pain, back pain, right ankle pain: - She reports taking Percocet 7.5 mg / 325 mg every 6 hours as needed.  Last UDS on 06/04/2023 was positive for THC and benzo.  She reports that she ran out of pain pills few days prior to that urine test.  I will check another UDS today.   5.  High risk drug  monitoring: - 2D echo on 01/15/2023: EF 60 to 65%.  No signs of PND or orthopnea.    Orders Placed This Encounter  Procedures   Magnesium    Standing Status:   Future    Standing Expiration Date:   06/30/2024   CBC with Differential    Standing Status:   Future    Standing Expiration Date:   06/30/2024   Comprehensive metabolic panel    Standing Status:   Future    Standing Expiration Date:   06/30/2024   Magnesium    Standing Status:   Future    Standing Expiration Date:   07/21/2024    CBC with Differential    Standing Status:   Future    Standing Expiration Date:   07/21/2024   Comprehensive metabolic panel    Standing Status:   Future    Standing Expiration Date:   07/21/2024   Magnesium    Standing Status:   Future    Standing Expiration Date:   08/11/2024   CBC with Differential    Standing Status:   Future    Standing Expiration Date:   08/11/2024   Comprehensive metabolic panel    Standing Status:   Future    Standing Expiration Date:   08/11/2024   Magnesium    Standing Status:   Future    Standing Expiration Date:   09/01/2024   CBC with Differential    Standing Status:   Future    Standing Expiration Date:   09/01/2024   Comprehensive metabolic panel    Standing Status:   Future    Standing Expiration Date:   09/01/2024      I,Katie Daubenspeck,acting as a scribe for Doreatha Massed, MD.,have documented all relevant documentation on the behalf of Doreatha Massed, MD,as directed by  Doreatha Massed, MD while in the presence of Doreatha Massed, MD.   I, Doreatha Massed MD, have reviewed the above documentation for accuracy and completeness, and I agree with the above.   Doreatha Massed, MD   10/23/20244:53 PM  CHIEF COMPLAINT:   Diagnosis: Her2+ metastatic right breast cancer    Cancer Staging  HER2-positive carcinoma of right breast [facility]) Staging form: Breast, AJCC 8th Edition - Clinical stage from 10/28/2021: Stage IV (cT4d, cN1, cM1, G3, ER+, PR-, HER2+) - Unsigned    Prior Therapy: 1. DOCEtaxel + Trastuzumab + Pertuzumab (THP) q21d x 4 cycles  2. SRS to brain metastases-- 10 fractions from 12/05/22 through 12/19/22, 1 fraction on 05/13/23  Current Therapy:  Enhertu    HISTORY OF PRESENT ILLNESS:   Oncology History  HER2-positive carcinoma of right breast (HCC)  10/28/2021 Initial Diagnosis   Breast cancer, right breast (HCC)   12/03/2021 - 02/25/2022 Chemotherapy   Patient is on Treatment Plan : BREAST DOCEtaxel  + Trastuzumab + Pertuzumab (THP) q21d x 8 cycles / Trastuzumab + Pertuzumab q21d x 4 cycles     01/01/2023 -  Chemotherapy   Patient is on Treatment Plan : BREAST METASTATIC Fam-Trastuzumab Deruxtecan-nxki (Enhertu) (5.4) q21d        INTERVAL HISTORY:   Dannika is a 47 y.o. female presenting to clinic today for follow up of Her2+ metastatic right breast cancer. She was last seen by me on 03/05/23.  Since her last visit, she received one fraction of SRS treatment to her brain metastasis on 05/13/23.  She also underwent restaging CT C/A/P on 06/04/23.   Today, she states that she is doing well overall. Her appetite level is  at 50%. Her energy level is at 75%.  PAST MEDICAL HISTORY:   Past Medical History: Past Medical History:  Diagnosis Date   Anxiety    Back pain    Cancer [facility])     Surgical History: Past Surgical History:  Procedure Laterality Date   CHOLECYSTECTOMY     ORTHOPEDIC SURGERY     PORTACATH PLACEMENT Left 11/18/2021   Procedure: INSERTION PORT-A-CATH;  Surgeon: Lucretia Roers, MD;  Location: [facility];  Service: General;  Laterality: Left;   SUBOCCIPITAL CRANIECTOMY CERVICAL LAMINECTOMY N/A 11/12/2022   Procedure: SUBOCCIPITAL CRANIECTOMY FOR RESECTION OF CEREBELLAR MASS;  Surgeon: Donalee Citrin, MD;  Location: [facility] OR;  Service: Neurosurgery;  Laterality: N/A;    Social History: Social History   Socioeconomic History   Marital status: Legally Separated    Spouse name: Not on file   Number of children: Not on file   Years of education: Not on file   Highest education level: Not on file  Occupational History   Not on file  Tobacco Use   Smoking status: Former    Current packs/day: 0.00    Average packs/day: 1 pack/day for 5.0 years (5.0 ttl pk-yrs)    Types: Cigarettes    Start date: 11/08/2016    Quit date: 11/08/2021    Years since quitting: 1.5   Smokeless tobacco: Never  Vaping Use   Vaping status: Never Used  Substance and Sexual Activity   Alcohol  use: No    Comment: occasionally   Drug use: No   Sexual activity: Yes    Birth control/protection: None  Other Topics Concern   Not on file  Social History Narrative   Not on file   Social Determinants of Health   Financial Resource Strain: High Risk (12/03/2021)   Overall Financial Resource Strain (CARDIA)    Difficulty of Paying Living Expenses: Very hard  Food Insecurity: No Food Insecurity (11/25/2022)   Hunger Vital Sign    Worried About Running Out of Food in the Last Year: Never true    Ran Out of Food in the Last Year: Never true  Transportation Needs: No Transportation Needs (11/25/2022)   PRAPARE - Administrator, Civil Service (Medical): No    Lack of Transportation (Non-Medical): No  Recent Concern: Transportation Needs - Unmet Transportation Needs (11/11/2022)   PRAPARE - Transportation    Lack of Transportation (Medical): Yes    Lack of Transportation (Non-Medical): Yes  Physical Activity: Sufficiently Active (06/17/2021)   Exercise Vital Sign    Days of Exercise per Week: 5 days    Minutes of Exercise per Session: 30 min  Stress: Stress Concern Present (06/17/2021)   [employer] of Occupational Health - Occupational Stress Questionnaire    Feeling of Stress : Rather much  Social Connections: Socially Isolated (06/17/2021)   Social Connection and Isolation Panel [NHANES]    Frequency of Communication with Friends and Family: Twice a week    Frequency of Social Gatherings with Friends and Family: Never    Attends Religious Services: 1 to 4 times per year    Active Member of [employer] or Organizations: No    Attends Banker Meetings: Never    Marital Status: Divorced  Catering manager Violence: Not At Risk (11/25/2022)   Humiliation, Afraid, Rape, and Kick questionnaire    Fear of Current or Ex-Partner: No    Emotionally Abused: No    Physically Abused: No    Sexually Abused: No  Family History: Family History  Problem Relation Age  of Onset   Heart disease Brother    Lung cancer Paternal Grandmother        lung   Cancer Paternal Grandfather    Asthma Daughter    Asthma Daughter     Current Medications:  Current Outpatient Medications:    ALPRAZolam (XANAX) 0.5 MG tablet, TAKE 1 TABLET BY MOUTH AT BEDTIME AS NEEDED FOR ANXIETY, Disp: 30 tablet, Rfl: 0   docusate sodium (COLACE) 100 MG capsule, Take 1 capsule (100 mg total) by mouth 2 (two) times daily., Disp: , Rfl:    furosemide (LASIX) 20 MG tablet, Take 1 tablet (20 mg total) by mouth daily as needed., Disp: 30 tablet, Rfl: 0   lidocaine-prilocaine (EMLA) cream, Apply 1 Application topically as needed., Disp: 30 g, Rfl: 0   mirtazapine (REMERON) 15 MG tablet, Take 1 tablet (15 mg total) by mouth at bedtime., Disp: 30 tablet, Rfl: 2   ondansetron (ZOFRAN) 8 MG tablet, TAKE 1 TABLET BY MOUTH EVERY 8 HOURS AS NEEDED FOR NAUSEA FOR VOMITING, Disp: 90 tablet, Rfl: 5   oxyCODONE-acetaminophen (PERCOCET) 7.5-325 MG tablet, Take 1 tablet by mouth every 6 (six) hours as needed for severe pain (pain score 7-10)., Disp: 24 tablet, Rfl: 0   pantoprazole (PROTONIX) 20 MG tablet, Take 1 tablet (20 mg total) by mouth daily., Disp: 30 tablet, Rfl: 5   prochlorperazine (COMPAZINE) 10 MG tablet, Take 1 tablet (10 mg total) by mouth every 6 (six) hours as needed for nausea or vomiting., Disp: 30 tablet, Rfl: 5 No current facility-administered medications for this visit.  Facility-Administered Medications Ordered in Other Visits:    sodium chloride flush (NS) 0.9 % injection 10 mL, 10 mL, Intracatheter, PRN, Doreatha Massed, MD, 10 mL at 06/10/23 1404   Allergies: Allergies  Allergen Reactions   Hydrocodone Itching, Nausea And Vomiting, Rash and Other (See Comments)    Hallucinations when given vicodin post crani in 2024   Sulfa Antibiotics Nausea And Vomiting   Morphine And Codeine Rash    REVIEW OF SYSTEMS:   Review of Systems  Constitutional:  Negative for chills,  fatigue and fever.  HENT:   Negative for lump/mass, mouth sores, nosebleeds, sore throat and trouble swallowing.   Eyes:  Negative for eye problems.  Respiratory:  Positive for cough and shortness of breath.   Cardiovascular:  Negative for chest pain, leg swelling and palpitations.  Gastrointestinal:  Negative for abdominal pain, constipation, diarrhea, nausea and vomiting.  Genitourinary:  Negative for bladder incontinence, difficulty urinating, dysuria, frequency, hematuria and nocturia.   Musculoskeletal:  Negative for arthralgias, back pain, flank pain, myalgias and neck pain.  Skin:  Negative for itching and rash.  Neurological:  Positive for headaches. Negative for dizziness and numbness.  Hematological:  Does not bruise/bleed easily.  Psychiatric/Behavioral:  Negative for depression, sleep disturbance and suicidal ideas. The patient is nervous/anxious.   All other systems reviewed and are negative.    VITALS:   There were no vitals taken for this visit.  Wt Readings from Last 3 Encounters:  06/10/23 119 lb 12.8 oz (54.3 kg)  05/08/23 119 lb 6.4 oz (54.2 kg)  05/06/23 117 lb 8 oz (53.3 kg)    There is no height or weight on file to calculate BMI.  Performance status (ECOG): 1 - Symptomatic but completely ambulatory  PHYSICAL EXAM:   Physical Exam Vitals and nursing note reviewed. Exam conducted with a chaperone present.  Constitutional:  Appearance: Normal appearance.  Cardiovascular:     Rate and Rhythm: Normal rate and regular rhythm.     Pulses: Normal pulses.     Heart sounds: Normal heart sounds.  Pulmonary:     Effort: Pulmonary effort is normal.     Breath sounds: Normal breath sounds.  Abdominal:     Palpations: Abdomen is soft. There is no hepatomegaly, splenomegaly or mass.     Tenderness: There is no abdominal tenderness.  Musculoskeletal:     Right lower leg: No edema.     Left lower leg: No edema.  Lymphadenopathy:     Cervical: No cervical  adenopathy.     Right cervical: No superficial, deep or posterior cervical adenopathy.    Left cervical: No superficial, deep or posterior cervical adenopathy.     Upper Body:     Right upper body: No supraclavicular or axillary adenopathy.     Left upper body: No supraclavicular or axillary adenopathy.  Neurological:     General: No focal deficit present.     Mental Status: She is alert and oriented to person, place, and time.  Psychiatric:        Mood and Affect: Mood normal.        Behavior: Behavior normal.     LABS:      Latest Ref Rng & Units 06/10/2023   10:51 AM 05/06/2023   10:17 AM 03/05/2023   10:34 AM  CBC  WBC 4.0 - 10.5 K/uL 9.0  5.9  6.2   Hemoglobin 12.0 - 15.0 g/dL 44.0  10.2  72.5   Hematocrit 36.0 - 46.0 % 37.1  37.2  32.7   Platelets 150 - 400 K/uL 144  250  262       Latest Ref Rng & Units 06/10/2023   10:51 AM 05/06/2023   10:17 AM 03/05/2023   10:34 AM  CMP  Glucose 70 - 99 mg/dL 366  84  440   BUN 6 - 20 mg/dL 20  14  12    Creatinine 0.44 - 1.00 mg/dL 3.47  4.25  9.56   Sodium 135 - 145 mmol/L 136  133  136   Potassium 3.5 - 5.1 mmol/L 3.6  3.6  3.4   Chloride 98 - 111 mmol/L 101  97  106   CO2 22 - 32 mmol/L 26  26  24    Calcium 8.9 - 10.3 mg/dL 9.1  9.0  8.6   Total Protein 6.5 - 8.1 g/dL 7.2  6.9  6.4   Total Bilirubin 0.3 - 1.2 mg/dL 0.3  0.5  0.3   Alkaline Phos 38 - 126 U/L 69  67  88   AST 15 - 41 U/L 28  40  46   ALT 0 - 44 U/L 26  29  32      No results found for: "CEA1", "CEA" / No results found for: "CEA1", "CEA" No results found for: "PSA1" No results found for: "LOV564" No results found for: "CAN125"  No results found for: "TOTALPROTELP", "ALBUMINELP", "A1GS", "A2GS", "BETS", "BETA2SER", "GAMS", "MSPIKE", "SPEI" No results found for: "TIBC", "FERRITIN", "IRONPCTSAT" No results found for: "LDH"   STUDIES:   CT CHEST ABDOMEN PELVIS W CONTRAST  Result Date: 06/10/2023 CLINICAL DATA:  Metastatic breast cancer restaging,  status post chemotherapy * Tracking Code: BO * EXAM: CT CHEST, ABDOMEN, AND PELVIS WITH CONTRAST TECHNIQUE: Multidetector CT imaging of the chest, abdomen and pelvis was performed following the standard protocol during bolus administration of  intravenous contrast. RADIATION DOSE REDUCTION: This exam was performed according to the departmental dose-optimization program which includes automated exposure control, adjustment of the mA and/or kV according to patient size and/or use of iterative reconstruction technique. CONTRAST:  OMNIPAQUE IOHEXOL 300 MG/ML SOLN additional oral enteric contrast COMPARISON:  CT chest abdomen pelvis, 11/10/2022, PET-CT, 11/28/2021 FINDINGS: CT CHEST FINDINGS Cardiovascular: No significant vascular findings. Normal heart size. No pericardial effusion. Mediastinum/Nodes: No enlarged mediastinal, hilar, or axillary lymph nodes. Thyroid gland, trachea, and esophagus demonstrate no significant findings. Lungs/Pleura: Minimal fine, scattered ground-glass throughout the upper lobes. Appearance of nodularity and interlobular septal thickening seen on prior examination is improved. No pleural effusion or pneumothorax. Musculoskeletal: Mild, persistent skin thickening of the right breast. Enhancing nodularity throughout the glandular tissue of the right breast seen on prior examination is no longer appreciated (series 2, image 24). No acute osseous findings. CT ABDOMEN PELVIS FINDINGS Hepatobiliary: No focal liver abnormality is seen. Specifically, no evidence of residual or recurrent hepatic metastatic disease. Status post cholecystectomy. Unchanged postoperative biliary ductal dilatation. Pancreas: Unremarkable. No pancreatic ductal dilatation or surrounding inflammatory changes. Spleen: Normal in size without significant abnormality. Adrenals/Urinary Tract: Adrenal glands are unremarkable. Kidneys are normal, without renal calculi, solid lesion, or hydronephrosis. Bladder is unremarkable.  Stomach/Bowel: Stomach is within normal limits. Slight interval enlargement of an appendiceal mucocele, measuring 1.4 cm in diameter (series 2, image 96). No evidence of bowel wall thickening, distention, or inflammatory changes. Large burden of stool throughout the colon and rectum. Vascular/Lymphatic: No significant vascular findings are present. No enlarged abdominal or pelvic lymph nodes. Reproductive: No mass or other abnormality. Other: No abdominal wall hernia or abnormality. No ascites. Musculoskeletal: No acute osseous findings. Very minimal, if any visible evidence of patient's previously established lytic osseous metastatic disease, for example a subtle lucency involving the posterior right acetabulum (series 2, image 106). IMPRESSION: 1. Mild, persistent skin thickening of the right breast. Enhancing nodularity throughout the glandular tissue of the right breast seen on prior examination is no longer appreciated, consistent with treatment response. 2. Minimal fine, scattered ground-glass throughout the upper lobes. Appearance of nodularity and interlobular septal thickening seen on prior examination is improved. Appearance on prior examination is suggestive of lymphangitic metastatic disease in the setting of otherwise widespread metastatic breast malignancy. 3. No evidence of residual or recurrent hepatic metastatic disease. 4. Unusually, patient has essentially no visible evidence of previously established widespread lytic osseous metastatic disease; lesions seen throughout on prior PET-CT dated 11/28/2021 appear healed without associated sclerosis. 5. Slight interval enlargement of an incidentally noted appendiceal mucocele, measuring 1.4 cm in diameter. Recommend surgical referral, as this may reflect an incidental metachronous mucinous appendiceal neoplasm. 6. Large burden of stool. These results will be called to the ordering clinician or representative by the Radiologist Assistant, and  communication documented in the PACS or [employer]. Electronically Signed   By: Jearld Lesch M.D.   On: 06/10/2023 11:16

## 2023-06-10 NOTE — Patient Instructions (Addendum)
[facility] at [facility] Discharge Instructions   You were seen and examined today by Dr. Ellin Saba.  He reviewed the results of your lab work which are normal/stable.   He reviewed the results of your CT scan which shows improvement of all the areas where the cancer was (in the lungs, bones, and liver).   We will proceed with your treatment today.   Return as scheduled.    Thank you for choosing [facility] at [facility] to provide your oncology and hematology care.  To afford each patient quality time with our provider, please arrive at least 15 minutes before your scheduled appointment time.   If you have a lab appointment with the Cancer Center please come in thru the Main Entrance and check in at the main information desk.  You need to re-schedule your appointment should you arrive 10 or more minutes late.  We strive to give you quality time with our providers, and arriving late affects you and other patients whose appointments are after yours.  Also, if you no show three or more times for appointments you may be dismissed from the clinic at the providers discretion.     Again, thank you for choosing [facility].  Our hope is that these requests will decrease the amount of time that you wait before being seen by our physicians.       _____________________________________________________________  Should you have questions after your visit to [facility], please contact our office at [phone] and follow the prompts.  Our office hours are 8:00 a.m. and 4:30 p.m. Monday - Friday.  Please note that voicemails left after 4:00 p.m. may not be returned until the following business day.  We are closed weekends and major holidays.  You do have access to a nurse 24-7, just call the main number to the clinic [phone] and do not press any options, hold on the line and a nurse will answer the phone.    For  prescription refill requests, have your pharmacy contact our office and allow 72 hours.    Due to Covid, you will need to wear a mask upon entering the hospital. If you do not have a mask, a mask will be given to you at the Main Entrance upon arrival. For doctor visits, patients may have 1 support person age 46 or older with them. For treatment visits, patients can not have anyone with them due to social distancing guidelines and our immunocompromised population.

## 2023-06-10 NOTE — Progress Notes (Signed)
Patient presents today for chemotherapy infusion. Patient is in satisfactory condition with no new complaints voiced.  Vital signs are stable.  Labs reviewed by Dr. Katragadda during the office visit and all labs are within treatment parameters.  We will proceed with treatment per MD orders.   Patient tolerated treatment well with no complaints voiced.  Patient left ambulatory in stable condition.  Vital signs stable at discharge.  Follow up as scheduled.       

## 2023-06-11 LAB — CANCER ANTIGEN 27.29: CA 27.29: 19.2 U/mL (ref 0.0–38.6)

## 2023-06-11 LAB — CANCER ANTIGEN 15-3: CA 15-3: 13.4 U/mL (ref 0.0–25.0)

## 2023-06-15 ENCOUNTER — Other Ambulatory Visit: Payer: Self-pay

## 2023-06-15 ENCOUNTER — Inpatient Hospital Stay: Payer: MEDICAID

## 2023-06-15 DIAGNOSIS — F1920 Other psychoactive substance dependence, uncomplicated: Secondary | ICD-10-CM

## 2023-06-15 DIAGNOSIS — Z1731 Human epidermal growth factor receptor 2 positive status: Secondary | ICD-10-CM

## 2023-06-16 ENCOUNTER — Other Ambulatory Visit: Payer: Self-pay

## 2023-06-16 ENCOUNTER — Other Ambulatory Visit: Payer: MEDICAID

## 2023-06-16 DIAGNOSIS — G893 Neoplasm related pain (acute) (chronic): Secondary | ICD-10-CM

## 2023-06-16 DIAGNOSIS — Z1731 Human epidermal growth factor receptor 2 positive status: Secondary | ICD-10-CM

## 2023-06-16 DIAGNOSIS — F1920 Other psychoactive substance dependence, uncomplicated: Secondary | ICD-10-CM

## 2023-06-16 DIAGNOSIS — F419 Anxiety disorder, unspecified: Secondary | ICD-10-CM

## 2023-06-16 DIAGNOSIS — Z5112 Encounter for antineoplastic immunotherapy: Secondary | ICD-10-CM | POA: Diagnosis not present

## 2023-06-16 LAB — RAPID URINE DRUG SCREEN, HOSP PERFORMED
Amphetamines: NOT DETECTED
Barbiturates: NOT DETECTED
Benzodiazepines: POSITIVE — AB
Cocaine: NOT DETECTED
Opiates: NOT DETECTED
Tetrahydrocannabinol: POSITIVE — AB

## 2023-06-16 MED ORDER — OXYCODONE-ACETAMINOPHEN 7.5-325 MG PO TABS: 1.0000 | ORAL_TABLET | Freq: Four times a day (QID) | ORAL | 0 refills | Status: DC | PRN

## 2023-06-23 ENCOUNTER — Ambulatory Visit
Admission: RE | Admit: 2023-06-23 | Discharge: 2023-06-23 | Disposition: A | Discharge status: Home / Self Care | Payer: MEDICAID | Source: Ambulatory Visit | Attending: Radiation Oncology | Admitting: Radiation Oncology

## 2023-06-23 NOTE — Progress Notes (Signed)
  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 (as documented in provider EOT note)  The patient was available for call today.  The patient did  note fatigue during radiation. The patient did not note hair loss or skin changes in the field of radiation during therapy. The patient is not taking dexamethasone. The patient does not have symptoms of  weakness or loss of control of the extremities. The patient does not have symptoms of headache. The patient does not have symptoms of seizure or uncontrolled movement. The patient does not have symptoms of changes in vision. The patient does not have changes in speech. The patient does not have confusion.   The patient was counseled that she will be contacted by our brain and spine navigator to schedule surveillance imaging. The patient was encouraged to call if she have not received a call to schedule imaging, or if she develops concerns or questions regarding radiation. The patient will also continue to follow up with Dr. Ellin Saba in medical oncology.  This concludes the interaction.  Ruel Favors, LPN

## 2023-07-01 ENCOUNTER — Inpatient Hospital Stay: Payer: MEDICAID

## 2023-07-01 ENCOUNTER — Inpatient Hospital Stay: Payer: MEDICAID | Admitting: Hematology

## 2023-07-09 ENCOUNTER — Other Ambulatory Visit: Payer: Self-pay

## 2023-07-09 ENCOUNTER — Inpatient Hospital Stay: Payer: MEDICAID | Attending: Hematology

## 2023-07-09 ENCOUNTER — Inpatient Hospital Stay: Payer: MEDICAID

## 2023-07-09 VITALS — BP 132/117 | HR 124 | Temp 98.1°F | Resp 20 | Wt 123.0 lb

## 2023-07-09 VITALS — BP 143/85 | HR 92 | Temp 98.1°F | Resp 20

## 2023-07-09 DIAGNOSIS — Z170 Estrogen receptor positive status [ER+]: Secondary | ICD-10-CM | POA: Diagnosis not present

## 2023-07-09 DIAGNOSIS — C50911 Malignant neoplasm of unspecified site of right female breast: Secondary | ICD-10-CM

## 2023-07-09 DIAGNOSIS — C50111 Malignant neoplasm of central portion of right female breast: Secondary | ICD-10-CM | POA: Diagnosis not present

## 2023-07-09 DIAGNOSIS — Z1731 Human epidermal growth factor receptor 2 positive status: Secondary | ICD-10-CM

## 2023-07-09 DIAGNOSIS — Z5112 Encounter for antineoplastic immunotherapy: Secondary | ICD-10-CM | POA: Insufficient documentation

## 2023-07-09 DIAGNOSIS — F419 Anxiety disorder, unspecified: Secondary | ICD-10-CM

## 2023-07-09 DIAGNOSIS — Z79899 Other long term (current) drug therapy: Secondary | ICD-10-CM

## 2023-07-09 DIAGNOSIS — C7931 Secondary malignant neoplasm of brain: Secondary | ICD-10-CM | POA: Insufficient documentation

## 2023-07-09 DIAGNOSIS — G893 Neoplasm related pain (acute) (chronic): Secondary | ICD-10-CM

## 2023-07-09 LAB — CBC WITH DIFFERENTIAL/PLATELET
Abs Immature Granulocytes: 0.03 10*3/uL (ref 0.00–0.07)
Basophils Absolute: 0.1 10*3/uL (ref 0.0–0.1)
Basophils Relative: 1 %
Eosinophils Absolute: 0.1 10*3/uL (ref 0.0–0.5)
Eosinophils Relative: 1 %
HCT: 39.8 % (ref 36.0–46.0)
Hemoglobin: 12.9 g/dL (ref 12.0–15.0)
Immature Granulocytes: 0 %
Lymphocytes Relative: 18 %
Lymphs Abs: 1.7 10*3/uL (ref 0.7–4.0)
MCH: 31.9 pg (ref 26.0–34.0)
MCHC: 32.4 g/dL (ref 30.0–36.0)
MCV: 98.3 fL (ref 80.0–100.0)
Monocytes Absolute: 0.8 10*3/uL (ref 0.1–1.0)
Monocytes Relative: 9 %
Neutro Abs: 6.8 10*3/uL (ref 1.7–7.7)
Neutrophils Relative %: 71 %
Platelets: 158 10*3/uL (ref 150–400)
RBC: 4.05 MIL/uL (ref 3.87–5.11)
RDW: 15.2 % (ref 11.5–15.5)
WBC: 9.6 10*3/uL (ref 4.0–10.5)
nRBC: 0 % (ref 0.0–0.2)

## 2023-07-09 LAB — RAPID URINE DRUG SCREEN, HOSP PERFORMED
Amphetamines: NOT DETECTED
Amphetamines: NOT DETECTED
Barbiturates: NOT DETECTED
Barbiturates: NOT DETECTED
Benzodiazepines: POSITIVE — AB
Benzodiazepines: NOT DETECTED
Cocaine: NOT DETECTED
Cocaine: NOT DETECTED
Opiates: NOT DETECTED
Opiates: NOT DETECTED
Tetrahydrocannabinol: NOT DETECTED

## 2023-07-09 LAB — COMPREHENSIVE METABOLIC PANEL
ALT: 18 U/L (ref 0–44)
AST: 22 U/L (ref 15–41)
Albumin: 3.8 g/dL (ref 3.5–5.0)
Alkaline Phosphatase: 68 U/L (ref 38–126)
Anion gap: 7 (ref 5–15)
BUN: 24 mg/dL — ABNORMAL HIGH (ref 6–20)
CO2: 25 mmol/L (ref 22–32)
Calcium: 9.5 mg/dL (ref 8.9–10.3)
Chloride: 104 mmol/L (ref 98–111)
Creatinine, Ser: 0.8 mg/dL (ref 0.44–1.00)
GFR, Estimated: >60 mL/min (ref >60)
Glucose, Bld: 115 mg/dL — ABNORMAL HIGH (ref 70–99)
Potassium: 3.9 mmol/L (ref 3.5–5.1)
Sodium: 136 mmol/L (ref 135–145)
Total Bilirubin: 0.6 mg/dL (ref <1.2)
Total Protein: 7.4 g/dL (ref 6.5–8.1)

## 2023-07-09 LAB — MAGNESIUM: Magnesium: 2.1 mg/dL (ref 1.7–2.4)

## 2023-07-09 MED ORDER — DEXAMETHASONE SODIUM PHOSPHATE 10 MG/ML IJ SOLN
10.0000 mg | Freq: Once | INTRAMUSCULAR | Status: AC
Start: 2023-07-09 — End: 2023-07-09
  Administered 2023-07-09: 10 mg via INTRAVENOUS
  Filled 2023-07-09: qty 1

## 2023-07-09 MED ORDER — HEPARIN SOD (PORK) LOCK FLUSH 100 UNIT/ML IV SOLN
500.0000 [IU] | Freq: Once | INTRAVENOUS | Status: AC | PRN
  Administered 2023-07-09: 500 [IU]

## 2023-07-09 MED ORDER — SODIUM CHLORIDE 0.9 % IV SOLN: 10.0000 mg | Freq: Once | INTRAVENOUS | Status: DC

## 2023-07-09 MED ORDER — ALPRAZOLAM 0.5 MG PO TABS: ORAL_TABLET | ORAL | 0 refills | Status: DC

## 2023-07-09 MED ORDER — SODIUM CHLORIDE 0.9% FLUSH
10.0000 mL | INTRAVENOUS | Status: DC | PRN
  Administered 2023-07-09: 10 mL

## 2023-07-09 MED ORDER — CETIRIZINE HCL 10 MG/ML IV SOLN
10.0000 mg | Freq: Once | INTRAVENOUS | Status: AC
  Administered 2023-07-09: 10 mg via INTRAVENOUS
  Filled 2023-07-09: qty 1

## 2023-07-09 MED ORDER — PALONOSETRON HCL INJECTION 0.25 MG/5ML
0.2500 mg | Freq: Once | INTRAVENOUS | Status: AC
  Administered 2023-07-09: 0.25 mg via INTRAVENOUS
  Filled 2023-07-09: qty 5

## 2023-07-09 MED ORDER — DEXTROSE 5 % IV SOLN: Freq: Once | INTRAVENOUS | Status: AC

## 2023-07-09 MED ORDER — ACETAMINOPHEN 325 MG PO TABS
650.0000 mg | ORAL_TABLET | Freq: Once | ORAL | Status: AC
  Administered 2023-07-09: 650 mg via ORAL
  Filled 2023-07-09: qty 2

## 2023-07-09 MED ORDER — SODIUM CHLORIDE 0.9 % IV SOLN
150.0000 mg | Freq: Once | INTRAVENOUS | Status: AC
  Administered 2023-07-09: 150 mg via INTRAVENOUS
  Filled 2023-07-09: qty 150

## 2023-07-09 MED ORDER — OXYCODONE-ACETAMINOPHEN 7.5-325 MG PO TABS: 1.0000 | ORAL_TABLET | Freq: Four times a day (QID) | ORAL | 0 refills | Status: DC | PRN

## 2023-07-09 MED ORDER — FAM-TRASTUZUMAB DERUXTECAN-NXKI CHEMO 100 MG IV SOLR
5.4000 mg/kg | Freq: Once | INTRAVENOUS | Status: AC
  Administered 2023-07-09: 300 mg via INTRAVENOUS
  Filled 2023-07-09: qty 15

## 2023-07-09 NOTE — Progress Notes (Signed)
Patient presents today for Enhertu, labs within treatment parameters. ECHO scheduled for 12/4, patient made aware and advised to pick upnew schedule when discharged. Patient aware that opiate screen was negative, patient states she took a pain pill this morning. Patient took pain medication in clinic for pain in front of RN. Patient requesting drug screen ran again, Dr. Anders Simmonds made aware, new urine collected for drug screen before discharge.  Patient tolerated therapy with no complaints voiced. Side effects with management reviewed with understanding verbalized. Port site clean and dry with no bruising or swelling noted at site. Good blood return noted before and after administration of therapy. Band aid applied. Patient left in satisfactory condition with VSS and no s/s of distress noted.

## 2023-07-09 NOTE — Patient Instructions (Signed)
[facility] - A DEPT OF [facility]  Discharge Instructions: Thank you for choosing [facility] to provide your oncology and hematology care.  If you have a lab appointment with the Cancer Center - please note that after April 8th, 2024, all labs will be drawn in the cancer center.  You do not have to check in or register with the main entrance as you have in the past but will complete your check-in in the cancer center.  Wear comfortable clothing and clothing appropriate for easy access to any Portacath or PICC line.   We strive to give you quality time with your provider. You may need to reschedule your appointment if you arrive late (15 or more minutes).  Arriving late affects you and other patients whose appointments are after yours.  Also, if you miss three or more appointments without notifying the office, you may be dismissed from the clinic at the provider's discretion.      For prescription refill requests, have your pharmacy contact our office and allow 72 hours for refills to be completed.    Today you received the following chemotherapy and/or immunotherapy agents Enhertu, return as scheduled.   To help prevent nausea and vomiting after your treatment, we encourage you to take your nausea medication as directed.  BELOW ARE SYMPTOMS THAT SHOULD BE REPORTED IMMEDIATELY: *FEVER GREATER THAN 100.4 F (38 C) OR HIGHER *CHILLS OR SWEATING *NAUSEA AND VOMITING THAT IS NOT CONTROLLED WITH YOUR NAUSEA MEDICATION *UNUSUAL SHORTNESS OF BREATH *UNUSUAL BRUISING OR BLEEDING *URINARY PROBLEMS (pain or burning when urinating, or frequent urination) *BOWEL PROBLEMS (unusual diarrhea, constipation, pain near the anus) TENDERNESS IN MOUTH AND THROAT WITH OR WITHOUT PRESENCE OF ULCERS (sore throat, sores in mouth, or a toothache) UNUSUAL RASH, SWELLING OR PAIN  UNUSUAL VAGINAL DISCHARGE OR ITCHING   Items with * indicate a potential emergency and  should be followed up as soon as possible or go to the Emergency Department if any problems should occur.  Please show the CHEMOTHERAPY ALERT CARD or IMMUNOTHERAPY ALERT CARD at check-in to the Emergency Department and triage nurse.  Should you have questions after your visit or need to cancel or reschedule your appointment, please contact [facility] - A DEPT OF [facility] [phone]  and follow the prompts.  Office hours are 8:00 a.m. to 4:30 p.m. Monday - Friday. Please note that voicemails left after 4:00 p.m. may not be returned until the following business day.  We are closed weekends and major holidays. You have access to a nurse at all times for urgent questions. Please call the main number to the clinic [phone] and follow the prompts.  For any non-urgent questions, you may also contact your provider using MyChart. We now offer e-Visits for anyone 11 and older to request care online for non-urgent symptoms. For details visit [email or web address].   Also download the MyChart app! Go to the app store, search "MyChart", open the app, select [place], and log in with your MyChart username and password.

## 2023-07-21 ENCOUNTER — Other Ambulatory Visit: Payer: Self-pay

## 2023-07-22 ENCOUNTER — Ambulatory Visit (HOSPITAL_COMMUNITY): Admission: RE | Admit: 2023-07-22 | Payer: MEDICAID | Source: Ambulatory Visit

## 2023-07-23 ENCOUNTER — Inpatient Hospital Stay: Payer: MEDICAID

## 2023-07-23 ENCOUNTER — Inpatient Hospital Stay: Payer: MEDICAID | Admitting: Hematology

## 2023-07-28 ENCOUNTER — Other Ambulatory Visit: Payer: Self-pay

## 2023-07-30 ENCOUNTER — Inpatient Hospital Stay: Payer: MEDICAID

## 2023-07-30 ENCOUNTER — Inpatient Hospital Stay: Payer: MEDICAID | Admitting: Hematology

## 2023-08-03 ENCOUNTER — Ambulatory Visit (HOSPITAL_COMMUNITY)
Admission: RE | Admit: 2023-08-03 | Discharge: 2023-08-03 | Disposition: A | Discharge status: Home / Self Care | Payer: MEDICAID | Source: Ambulatory Visit | Attending: Hematology | Admitting: Hematology

## 2023-08-03 DIAGNOSIS — Z79899 Other long term (current) drug therapy: Secondary | ICD-10-CM | POA: Insufficient documentation

## 2023-08-03 DIAGNOSIS — Z1731 Human epidermal growth factor receptor 2 positive status: Secondary | ICD-10-CM | POA: Diagnosis present

## 2023-08-03 DIAGNOSIS — C50911 Malignant neoplasm of unspecified site of right female breast: Secondary | ICD-10-CM | POA: Insufficient documentation

## 2023-08-03 LAB — ECHOCARDIOGRAM COMPLETE
AR max vel: 1.83 cm2
AV Area VTI: 1.69 cm2
AV Area mean vel: 1.8 cm2
AV Mean grad: 4 mm[Hg]
AV Peak grad: 7.1 mm[Hg]
Ao pk vel: 1.33 m/s
Area-P 1/2: 4.44 cm2
S' Lateral: 2.7 cm

## 2023-08-03 NOTE — Progress Notes (Signed)
*  PRELIMINARY RESULTS* Echocardiogram 2D Echocardiogram has been performed.  Sara [name] 08/03/2023, 10:32 AM

## 2023-08-05 NOTE — Progress Notes (Signed)
[facility] [address], [place] [address]    Clinic Day:  08/06/2023  Referring physician: Toma Deiters, MD  Patient Care Team: Toma Deiters, MD as PCP - General (Internal Medicine) Doreatha Massed, MD as Medical Oncologist (Medical Oncology) Therese Sarah, RN as Oncology Nurse Navigator (Oncology)   ASSESSMENT & PLAN:   Assessment: 1. Her2+ metastatic breast cancer: - She reports feeling a knot in the right breast in July 2022.  She felt the same lump in September which has increased slightly in November.  In the last 3 months, it has increased in size from a quarter to size of an orange. - Mammogram/ultrasound on 10/01/2021: Large irregular mass involving the entire central right breast measuring 4.9 x 2.4 x 4 cm at 12:00.  At least 3 lymph nodes in the right axilla.  Overlying skin thickening and generalized erythema and marked tenderness. - Right breast mass and axillary lymph node biopsy on 10/08/2021: - Pathology: Poorly differentiated ductal carcinoma, grade 3, HER2 3+ positive, ER 5% strong staining intensity, PR negative, Ki-67 25%. - PET scan (11/30/2021): Hypermetabolic tumor involving right breast, metastatic mediastinal and hilar lymphadenopathy, hepatic metastatic disease, diffuse lytic bone lesions. - 4 cycles of THP from 12/03/2021 through 02/25/2022 - Cerebellar mass resection (11/12/2022): Metastatic carcinoma with tumor cells positive for CK7, GATA3, indicating breast primary.  ER and CK20 are negative.  HER2 is 3+. - Enhertu started on 01/01/2023   2. Social/family history: - She is currently living with her father, youngest daughter and sister.  She has 2 daughters ages 67 and 69.  She has worked in Journalist, newspaper previously.  She quit smoking 3 months ago.  Prior to that she smoked a pack a day for 4 to 5 years and less than half pack a day since 2011.  She vapes occasionally at this time. - Paternal great grandmother had  lung cancer.  Paternal aunt had lung cancer at age 79.  Paternal grandmother had breast cancer in her 18s.  Paternal grandfather had colon cancer.  Maternal grandfather had colon cancer.    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  Also improvement in bone lesions.  Incidental appendiceal mucocele was seen. - She is asymptomatic.  She is tolerating Enhertu reasonably well. - Reviewed labs today: Normal LFTs and creatinine.  CBC normal. - She may proceed with her treatment today and in 3 weeks.  RTC 6 weeks for follow-up with repeat CT CAP with contrast and tumor markers. - She will have 3T MRI of the brain done on 08/17/2023.  2.  Difficulty falling asleep: - Continue mirtazapine 15 mg at bedtime which is helping.  3.  Anxiety: - Continue Xanax 0.5 mg daily as needed.  This is well-controlled.  4.  Chest pain, back pain, right ankle pain: - She ran out of her pills few days ago as they were given for 3 weeks.  Urine drug screen today shows positivity for benzodiazepines and negative for opiates. - She will be given refill for Percocet 7.5 mg #84 today.   5.  High risk drug monitoring: - Reviewed 2D echo from 08/03/2023, LVEF 60 to 65% and stable.    Orders Placed This Encounter  Procedures   CT CHEST ABDOMEN PELVIS W CONTRAST    Standing Status:   Future    Expected Date:   09/06/2023  Expiration Date:   08/05/2024    If indicated for the ordered procedure, I authorize the administration of contrast media per Radiology protocol:   Yes    Does the patient have a contrast media/X-ray dye allergy?:   No    Preferred imaging location?:   [facility]    Release to patient:   Immediate    If indicated for the ordered procedure, I authorize the administration of oral contrast media per Radiology protocol:   Yes   Magnesium    Standing Status:   Future    Expected Date:   09/23/2023     Expiration Date:   09/22/2024   CBC with Differential    Standing Status:   Future    Expected Date:   09/23/2023    Expiration Date:   09/22/2024   Comprehensive metabolic panel    Standing Status:   Future    Expected Date:   09/23/2023    Expiration Date:   09/22/2024   Magnesium    Standing Status:   Future    Expected Date:   10/14/2023    Expiration Date:   10/13/2024   CBC with Differential    Standing Status:   Future    Expected Date:   10/14/2023    Expiration Date:   10/13/2024   Comprehensive metabolic panel    Standing Status:   Future    Expected Date:   10/14/2023    Expiration Date:   10/13/2024      Mikeal Hawthorne R Teague,acting as a scribe for Doreatha Massed, MD.,have documented all relevant documentation on the behalf of Doreatha Massed, MD,as directed by  Doreatha Massed, MD while in the presence of Doreatha Massed, MD.  I, Doreatha Massed MD, have reviewed the above documentation for accuracy and completeness, and I agree with the above.    Doreatha Massed, MD   12/19/20244:44 PM  CHIEF COMPLAINT:   Diagnosis: Her2+ metastatic right breast cancer    Cancer Staging  HER2-positive carcinoma of right breast [facility]) Staging form: Breast, AJCC 8th Edition - Clinical stage from 10/28/2021: Stage IV (cT4d, cN1, cM1, G3, ER+, PR-, HER2+) - Unsigned    Prior Therapy: 1. DOCEtaxel + Trastuzumab + Pertuzumab (THP) q21d x 4 cycles  2. SRS to brain metastases-- 10 fractions from 12/05/22 through 12/19/22, 1 fraction on 05/13/23  Current Therapy:  Enhertu    HISTORY OF PRESENT ILLNESS:   Oncology History  HER2-positive carcinoma of right breast (HCC)  10/28/2021 Initial Diagnosis   Breast cancer, right breast (HCC)   12/03/2021 - 02/25/2022 Chemotherapy   Patient is on Treatment Plan : BREAST DOCEtaxel + Trastuzumab + Pertuzumab (THP) q21d x 8 cycles / Trastuzumab + Pertuzumab q21d x 4 cycles     01/01/2023 -  Chemotherapy   Patient is on Treatment Plan :  BREAST METASTATIC Fam-Trastuzumab Deruxtecan-nxki (Enhertu) (5.4) q21d        INTERVAL HISTORY:   Sara [name] is a 47 y.o. female presenting to clinic today for follow up of Her2+ metastatic right breast cancer. She was last seen by me on 06/10/23.  Today, she states that she is doing well overall. Her appetite level is at 60%. Her energy level is at 60%. She is accompanied by a family member.   She has intermittent headaches, with a history of migraines. She notes left knee pain that is a new onset. She reports occasional diarrhea. She takes Colace when constipated that improves symptoms. She would like a refill of  pain medication.  She is taking acid reflux medication prn.   PAST MEDICAL HISTORY:   Past Medical History: Past Medical History:  Diagnosis Date   Anxiety    Back pain    Cancer [facility])     Surgical History: Past Surgical History:  Procedure Laterality Date   CHOLECYSTECTOMY     ORTHOPEDIC SURGERY     PORTACATH PLACEMENT Left 11/18/2021   Procedure: INSERTION PORT-A-CATH;  Surgeon: Lucretia Roers, MD;  Location: [facility];  Service: General;  Laterality: Left;   SUBOCCIPITAL CRANIECTOMY CERVICAL LAMINECTOMY N/A 11/12/2022   Procedure: SUBOCCIPITAL CRANIECTOMY FOR RESECTION OF CEREBELLAR MASS;  Surgeon: Donalee Citrin, MD;  Location: [facility] OR;  Service: Neurosurgery;  Laterality: N/A;    Social History: Social History   Socioeconomic History   Marital status: Legally Separated    Spouse name: Not on file   Number of children: Not on file   Years of education: Not on file   Highest education level: Not on file  Occupational History   Not on file  Tobacco Use   Smoking status: Former    Current packs/day: 0.00    Average packs/day: 1 pack/day for 5.0 years (5.0 ttl pk-yrs)    Types: Cigarettes    Start date: 11/08/2016    Quit date: 11/08/2021    Years since quitting: 1.7   Smokeless tobacco: Never  Vaping Use   Vaping status: Never Used  Substance and Sexual Activity    Alcohol use: No    Comment: occasionally   Drug use: No   Sexual activity: Yes    Birth control/protection: None  Other Topics Concern   Not on file  Social History Narrative   Not on file   Social Drivers of Health   Financial Resource Strain: High Risk (12/03/2021)   Overall Financial Resource Strain (CARDIA)    Difficulty of Paying Living Expenses: Very hard  Food Insecurity: No Food Insecurity (11/25/2022)   Hunger Vital Sign    Worried About Running Out of Food in the Last Year: Never true    Ran Out of Food in the Last Year: Never true  Transportation Needs: No Transportation Needs (11/25/2022)   PRAPARE - Administrator, Civil Service (Medical): No    Lack of Transportation (Non-Medical): No  Recent Concern: Transportation Needs - Unmet Transportation Needs (11/11/2022)   PRAPARE - Transportation    Lack of Transportation (Medical): Yes    Lack of Transportation (Non-Medical): Yes  Physical Activity: Sufficiently Active (06/17/2021)   Exercise Vital Sign    Days of Exercise per Week: 5 days    Minutes of Exercise per Session: 30 min  Stress: Stress Concern Present (06/17/2021)   [employer] of Occupational Health - Occupational Stress Questionnaire    Feeling of Stress : Rather much  Social Connections: Socially Isolated (06/17/2021)   Social Connection and Isolation Panel [NHANES]    Frequency of Communication with Friends and Family: Twice a week    Frequency of Social Gatherings with Friends and Family: Never    Attends Religious Services: 1 to 4 times per year    Active Member of [employer] or Organizations: No    Attends Banker Meetings: Never    Marital Status: Divorced  Catering manager Violence: Not At Risk (11/25/2022)   Humiliation, Afraid, Rape, and Kick questionnaire    Fear of Current or Ex-Partner: No    Emotionally Abused: No    Physically Abused: No    Sexually Abused:  No    Family History: Family History  Problem  Relation Age of Onset   Heart disease Brother    Lung cancer Paternal Grandmother        lung   Cancer Paternal Grandfather    Asthma Daughter    Asthma Daughter     Current Medications:  Current Outpatient Medications:    docusate sodium (COLACE) 100 MG capsule, Take 1 capsule (100 mg total) by mouth 2 (two) times daily., Disp: , Rfl:    furosemide (LASIX) 20 MG tablet, Take 1 tablet (20 mg total) by mouth daily as needed., Disp: 30 tablet, Rfl: 0   lidocaine-prilocaine (EMLA) cream, Apply 1 Application topically as needed., Disp: 30 g, Rfl: 0   mirtazapine (REMERON) 15 MG tablet, Take 1 tablet (15 mg total) by mouth at bedtime., Disp: 30 tablet, Rfl: 2   ondansetron (ZOFRAN) 8 MG tablet, TAKE 1 TABLET BY MOUTH EVERY 8 HOURS AS NEEDED FOR NAUSEA FOR VOMITING, Disp: 90 tablet, Rfl: 5   pantoprazole (PROTONIX) 20 MG tablet, Take 1 tablet (20 mg total) by mouth daily., Disp: 30 tablet, Rfl: 5   prochlorperazine (COMPAZINE) 10 MG tablet, Take 1 tablet (10 mg total) by mouth every 6 (six) hours as needed for nausea or vomiting., Disp: 30 tablet, Rfl: 5   ALPRAZolam (XANAX) 0.5 MG tablet, TAKE 1 TABLET BY MOUTH AT BEDTIME AS NEEDED FOR ANXIETY, Disp: 30 tablet, Rfl: 0   oxyCODONE-acetaminophen (PERCOCET) 7.5-325 MG tablet, Take 1 tablet by mouth every 6 (six) hours as needed for severe pain (pain score 7-10)., Disp: 84 tablet, Rfl: 0 No current facility-administered medications for this visit.  Facility-Administered Medications Ordered in Other Visits:    sodium chloride flush (NS) 0.9 % injection 10 mL, 10 mL, Intracatheter, PRN, Doreatha Massed, MD, 10 mL at 08/06/23 1327   Allergies: Allergies  Allergen Reactions   Hydrocodone Itching, Nausea And Vomiting, Rash and Other (See Comments)    Hallucinations when given vicodin post crani in 2024   Sulfa Antibiotics Nausea And Vomiting   Morphine And Codeine Rash    REVIEW OF SYSTEMS:   Review of Systems  Constitutional:  Negative  for chills, fatigue and fever.  HENT:   Negative for lump/mass, mouth sores, nosebleeds, sore throat and trouble swallowing.   Eyes:  Negative for eye problems.  Respiratory:  Negative for cough and shortness of breath.   Cardiovascular:  Negative for chest pain, leg swelling and palpitations.  Gastrointestinal:  Positive for constipation and diarrhea. Negative for abdominal pain, nausea and vomiting.  Genitourinary:  Negative for bladder incontinence, difficulty urinating, dysuria, frequency, hematuria and nocturia.   Musculoskeletal:  Negative for arthralgias, back pain, flank pain, myalgias and neck pain.       +pain in hip and left knee, 6-7/10 severity  Skin:  Negative for itching and rash.  Neurological:  Positive for headaches. Negative for dizziness and numbness.  Hematological:  Does not bruise/bleed easily.  Psychiatric/Behavioral:  Positive for depression. Negative for sleep disturbance and suicidal ideas. The patient is nervous/anxious.   All other systems reviewed and are negative.    VITALS:   Height 5\' 4"  (1.626 m), weight 122 lb 6.4 oz (55.5 kg).  Wt Readings from Last 3 Encounters:  08/06/23 122 lb 6.4 oz (55.5 kg)  07/09/23 123 lb (55.8 kg)  06/10/23 119 lb 12.8 oz (54.3 kg)    Body mass index is 21.01 kg/m.  Performance status (ECOG): 1 - Symptomatic but completely ambulatory  PHYSICAL  EXAM:   Physical Exam Vitals and nursing note reviewed. Exam conducted with a chaperone present.  Constitutional:      Appearance: Normal appearance.  Cardiovascular:     Rate and Rhythm: Normal rate and regular rhythm.     Pulses: Normal pulses.     Heart sounds: Normal heart sounds.  Pulmonary:     Effort: Pulmonary effort is normal.     Breath sounds: Normal breath sounds.  Abdominal:     Palpations: Abdomen is soft. There is no hepatomegaly, splenomegaly or mass.     Tenderness: There is no abdominal tenderness.  Musculoskeletal:     Right lower leg: No edema.      Left lower leg: No edema.  Lymphadenopathy:     Cervical: No cervical adenopathy.     Right cervical: No superficial, deep or posterior cervical adenopathy.    Left cervical: No superficial, deep or posterior cervical adenopathy.     Upper Body:     Right upper body: No supraclavicular or axillary adenopathy.     Left upper body: No supraclavicular or axillary adenopathy.  Neurological:     General: No focal deficit present.     Mental Status: She is alert and oriented to person, place, and time.  Psychiatric:        Mood and Affect: Mood normal.        Behavior: Behavior normal.     LABS:      Latest Ref Rng & Units 08/06/2023    9:46 AM 07/09/2023    9:37 AM 06/10/2023   10:51 AM  CBC  WBC 4.0 - 10.5 K/uL 6.1  9.6  9.0   Hemoglobin 12.0 - 15.0 g/dL 09.8  11.9  14.7   Hematocrit 36.0 - 46.0 % 39.0  39.8  37.1   Platelets 150 - 400 K/uL 204  158  144       Latest Ref Rng & Units 08/06/2023    9:46 AM 07/09/2023    9:37 AM 06/10/2023   10:51 AM  CMP  Glucose 70 - 99 mg/dL 829  562  130   BUN 6 - 20 mg/dL 15  24  20    Creatinine 0.44 - 1.00 mg/dL 8.65  7.84  6.96   Sodium 135 - 145 mmol/L 136  136  136   Potassium 3.5 - 5.1 mmol/L 3.4  3.9  3.6   Chloride 98 - 111 mmol/L 103  104  101   CO2 22 - 32 mmol/L 23  25  26    Calcium 8.9 - 10.3 mg/dL 9.4  9.5  9.1   Total Protein 6.5 - 8.1 g/dL 7.2  7.4  7.2   Total Bilirubin <1.2 mg/dL 0.3  0.6  0.3   Alkaline Phos 38 - 126 U/L 75  68  69   AST 15 - 41 U/L 22  22  28    ALT 0 - 44 U/L 19  18  26       No results found for: "CEA1", "CEA" / No results found for: "CEA1", "CEA" No results found for: "PSA1" No results found for: "EXB284" No results found for: "CAN125"  No results found for: "TOTALPROTELP", "ALBUMINELP", "A1GS", "A2GS", "BETS", "BETA2SER", "GAMS", "MSPIKE", "SPEI" No results found for: "TIBC", "FERRITIN", "IRONPCTSAT" No results found for: "LDH"   STUDIES:   ECHOCARDIOGRAM COMPLETE Result Date:  08/03/2023    ECHOCARDIOGRAM REPORT   Patient Name:   Sara [name] Date of Exam: 08/03/2023 Medical Rec #:  [id number]  Height:       64.0 in Accession #:    [id number]      Weight:       123.0 lb Date of Birth:  1976/04/03       BSA:          1.591 m Patient Age:    47 years        BP:           118/74 mmHg Patient Gender: F               HR:           112 bpm. [facility] Procedure: 2D Echo, Cardiac Doppler, Color Doppler and Strain Analysis Indications:    Chemo  History:        Patient has prior history of Echocardiogram examinations, most                 recent 12/17/2022. Breast Cancer.  Sonographer:    Dondra Prader RVT RCS Referring Phys: [phone] [facility] IMPRESSIONS  1. Left ventricular ejection fraction, by estimation, is 60 to 65%. The left ventricle has normal function. The left ventricle has no regional wall motion abnormalities. Left ventricular diastolic parameters were normal.  2. Right ventricular systolic function is normal. The right ventricular size is normal.  3. The mitral valve is normal in structure. No evidence of mitral valve regurgitation. No evidence of mitral stenosis.  4. The aortic valve was not well visualized. Aortic valve regurgitation is not visualized. No aortic stenosis is present.  5. The inferior vena cava is normal in size with greater than 50% respiratory variability, suggesting right atrial pressure of 3 mmHg. FINDINGS  Left Ventricle: Left ventricular ejection fraction, by estimation, is 60 to 65%. The left ventricle has normal function. The left ventricle has no regional wall motion abnormalities. Global longitudinal strain performed but not reported based on interpreter judgement due to suboptimal tracking. The left ventricular internal cavity size was normal in size. There is no left ventricular hypertrophy. Left ventricular diastolic parameters were normal. Right Ventricle: The right ventricular size is normal. Right vetricular wall  thickness was not well visualized. Right ventricular systolic function is normal. Left Atrium: Left atrial size was normal in size. Right Atrium: Right atrial size was normal in size. Pericardium: There is no evidence of pericardial effusion. Mitral Valve: The mitral valve is normal in structure. No evidence of mitral valve regurgitation. No evidence of mitral valve stenosis. Tricuspid Valve: The tricuspid valve is normal in structure. Tricuspid valve regurgitation is not demonstrated. No evidence of tricuspid stenosis. Aortic Valve: The aortic valve was not well visualized. Aortic valve regurgitation is not visualized. No aortic stenosis is present. Aortic valve mean gradient measures 4.0 mmHg. Aortic valve peak gradient measures 7.1 mmHg. Aortic valve area, by VTI measures 1.69 cm. Pulmonic Valve: The pulmonic valve was not well visualized. Pulmonic valve regurgitation is not visualized. No evidence of pulmonic stenosis. Aorta: The aortic root and ascending aorta are structurally normal, with no evidence of dilitation. Venous: The inferior vena cava is normal in size with greater than 50% respiratory variability, suggesting right atrial pressure of 3 mmHg. IAS/Shunts: No atrial level shunt detected by color flow Doppler.  LEFT VENTRICLE PLAX 2D LVIDd:         4.00 cm   Diastology LVIDs:         2.70 cm   LV e' medial:    9.57 cm/s LV PW:  0.70 cm   LV E/e' medial:  7.6 LV IVS:        0.80 cm   LV e' lateral:   12.70 cm/s LVOT diam:     1.70 cm   LV E/e' lateral: 5.7 LV SV:         37 LV SV Index:   23 LVOT Area:     2.27 cm  RIGHT VENTRICLE             IVC RV Basal diam:  2.70 cm     IVC diam: 1.30 cm RV S prime:     17.80 cm/s TAPSE (M-mode): 2.1 cm LEFT ATRIUM             Index        RIGHT ATRIUM          Index LA diam:        2.30 cm 1.45 cm/m   RA Area:     6.75 cm LA Vol (A2C):   15.5 ml 9.74 ml/m   RA Volume:   10.80 ml 6.79 ml/m LA Vol (A4C):   14.3 ml 8.99 ml/m LA Biplane Vol: 17.7 ml 11.12  ml/m  AORTIC VALVE                    PULMONIC VALVE AV Area (Vmax):    1.83 cm     PV Vmax:       1.32 m/s AV Area (Vmean):   1.80 cm     PV Peak grad:  7.0 mmHg AV Area (VTI):     1.69 cm AV Vmax:           133.00 cm/s AV Vmean:          86.200 cm/s AV VTI:            0.218 m AV Peak Grad:      7.1 mmHg AV Mean Grad:      4.0 mmHg LVOT Vmax:         107.00 cm/s LVOT Vmean:        68.400 cm/s LVOT VTI:          0.162 m LVOT/AV VTI ratio: 0.74  AORTA Ao Root diam: 2.60 cm Ao Asc diam:  2.80 cm MITRAL VALVE MV Area (PHT): 4.44 cm    SHUNTS MV Decel Time: 171 msec    Systemic VTI:  0.16 m MV E velocity: 72.80 cm/s  Systemic Diam: 1.70 cm MV A velocity: 66.00 cm/s MV E/A ratio:  1.10 Dina Rich MD Electronically signed by Dina Rich MD Signature Date/Time: 08/03/2023/11:01:06 AM    Final

## 2023-08-06 ENCOUNTER — Inpatient Hospital Stay: Payer: MEDICAID

## 2023-08-06 ENCOUNTER — Inpatient Hospital Stay: Payer: MEDICAID | Attending: Hematology | Admitting: Hematology

## 2023-08-06 ENCOUNTER — Other Ambulatory Visit: Payer: Self-pay | Admitting: *Deleted

## 2023-08-06 VITALS — Ht 64.0 in | Wt 122.4 lb

## 2023-08-06 VITALS — BP 134/87 | HR 114 | Temp 97.9°F | Resp 20

## 2023-08-06 VITALS — BP 126/81 | HR 91 | Temp 96.7°F | Resp 18

## 2023-08-06 DIAGNOSIS — G893 Neoplasm related pain (acute) (chronic): Secondary | ICD-10-CM

## 2023-08-06 DIAGNOSIS — C7931 Secondary malignant neoplasm of brain: Secondary | ICD-10-CM | POA: Diagnosis not present

## 2023-08-06 DIAGNOSIS — Z1731 Human epidermal growth factor receptor 2 positive status: Secondary | ICD-10-CM

## 2023-08-06 DIAGNOSIS — C50911 Malignant neoplasm of unspecified site of right female breast: Secondary | ICD-10-CM | POA: Diagnosis not present

## 2023-08-06 DIAGNOSIS — C50811 Malignant neoplasm of overlapping sites of right female breast: Secondary | ICD-10-CM | POA: Insufficient documentation

## 2023-08-06 DIAGNOSIS — Z1722 Progesterone receptor negative status: Secondary | ICD-10-CM | POA: Insufficient documentation

## 2023-08-06 DIAGNOSIS — Z5112 Encounter for antineoplastic immunotherapy: Secondary | ICD-10-CM | POA: Diagnosis present

## 2023-08-06 DIAGNOSIS — Z79899 Other long term (current) drug therapy: Secondary | ICD-10-CM

## 2023-08-06 DIAGNOSIS — F419 Anxiety disorder, unspecified: Secondary | ICD-10-CM

## 2023-08-06 DIAGNOSIS — C7951 Secondary malignant neoplasm of bone: Secondary | ICD-10-CM | POA: Diagnosis not present

## 2023-08-06 DIAGNOSIS — C787 Secondary malignant neoplasm of liver and intrahepatic bile duct: Secondary | ICD-10-CM | POA: Insufficient documentation

## 2023-08-06 DIAGNOSIS — Z95828 Presence of other vascular implants and grafts: Secondary | ICD-10-CM

## 2023-08-06 DIAGNOSIS — Z87891 Personal history of nicotine dependence: Secondary | ICD-10-CM | POA: Diagnosis not present

## 2023-08-06 LAB — COMPREHENSIVE METABOLIC PANEL
ALT: 19 U/L (ref 0–44)
AST: 22 U/L (ref 15–41)
Albumin: 3.8 g/dL (ref 3.5–5.0)
Alkaline Phosphatase: 75 U/L (ref 38–126)
Anion gap: 10 (ref 5–15)
BUN: 15 mg/dL (ref 6–20)
CO2: 23 mmol/L (ref 22–32)
Calcium: 9.4 mg/dL (ref 8.9–10.3)
Chloride: 103 mmol/L (ref 98–111)
Creatinine, Ser: 0.82 mg/dL (ref 0.44–1.00)
GFR, Estimated: >60 mL/min (ref >60)
Glucose, Bld: 152 mg/dL — ABNORMAL HIGH (ref 70–99)
Potassium: 3.4 mmol/L — ABNORMAL LOW (ref 3.5–5.1)
Sodium: 136 mmol/L (ref 135–145)
Total Bilirubin: 0.3 mg/dL (ref <1.2)
Total Protein: 7.2 g/dL (ref 6.5–8.1)

## 2023-08-06 LAB — CBC WITH DIFFERENTIAL/PLATELET
Abs Immature Granulocytes: 0.02 10*3/uL (ref 0.00–0.07)
Basophils Absolute: 0 10*3/uL (ref 0.0–0.1)
Basophils Relative: 1 %
Eosinophils Absolute: 0.1 10*3/uL (ref 0.0–0.5)
Eosinophils Relative: 1 %
HCT: 39 % (ref 36.0–46.0)
Hemoglobin: 12.9 g/dL (ref 12.0–15.0)
Immature Granulocytes: 0 %
Lymphocytes Relative: 20 %
Lymphs Abs: 1.2 10*3/uL (ref 0.7–4.0)
MCH: 32.8 pg (ref 26.0–34.0)
MCHC: 33.1 g/dL (ref 30.0–36.0)
MCV: 99.2 fL (ref 80.0–100.0)
Monocytes Absolute: 0.5 10*3/uL (ref 0.1–1.0)
Monocytes Relative: 8 %
Neutro Abs: 4.3 10*3/uL (ref 1.7–7.7)
Neutrophils Relative %: 70 %
Platelets: 204 10*3/uL (ref 150–400)
RBC: 3.93 MIL/uL (ref 3.87–5.11)
RDW: 14.2 % (ref 11.5–15.5)
WBC: 6.1 10*3/uL (ref 4.0–10.5)
nRBC: 0 % (ref 0.0–0.2)

## 2023-08-06 LAB — RAPID URINE DRUG SCREEN, HOSP PERFORMED
Amphetamines: NOT DETECTED
Barbiturates: NOT DETECTED
Benzodiazepines: POSITIVE — AB
Cocaine: NOT DETECTED
Opiates: NOT DETECTED
Tetrahydrocannabinol: NOT DETECTED

## 2023-08-06 LAB — MAGNESIUM: Magnesium: 1.9 mg/dL (ref 1.7–2.4)

## 2023-08-06 MED ORDER — ALPRAZOLAM 0.5 MG PO TABS: ORAL_TABLET | ORAL | 0 refills | Status: DC

## 2023-08-06 MED ORDER — DEXAMETHASONE SODIUM PHOSPHATE 100 MG/10ML IJ SOLN: 10.0000 mg | Freq: Once | INTRAMUSCULAR | Status: DC

## 2023-08-06 MED ORDER — SODIUM CHLORIDE 0.9% FLUSH
10.0000 mL | INTRAVENOUS | Status: DC | PRN
  Administered 2023-08-06: 10 mL via INTRAVENOUS

## 2023-08-06 MED ORDER — FAM-TRASTUZUMAB DERUXTECAN-NXKI CHEMO 100 MG IV SOLR
5.4000 mg/kg | Freq: Once | INTRAVENOUS | Status: AC
  Administered 2023-08-06: 300 mg via INTRAVENOUS
  Filled 2023-08-06: qty 15

## 2023-08-06 MED ORDER — SODIUM CHLORIDE 0.9% FLUSH
10.0000 mL | INTRAVENOUS | Status: DC | PRN
  Administered 2023-08-06: 10 mL

## 2023-08-06 MED ORDER — SODIUM CHLORIDE 0.9 % IV SOLN
150.0000 mg | Freq: Once | INTRAVENOUS | Status: AC
  Administered 2023-08-06: 150 mg via INTRAVENOUS
  Filled 2023-08-06: qty 150

## 2023-08-06 MED ORDER — DEXAMETHASONE SODIUM PHOSPHATE 10 MG/ML IJ SOLN
10.0000 mg | Freq: Once | INTRAMUSCULAR | Status: AC
  Administered 2023-08-06: 10 mg via INTRAVENOUS
  Filled 2023-08-06: qty 1

## 2023-08-06 MED ORDER — PALONOSETRON HCL INJECTION 0.25 MG/5ML
0.2500 mg | Freq: Once | INTRAVENOUS | Status: AC
  Administered 2023-08-06: 0.25 mg via INTRAVENOUS
  Filled 2023-08-06: qty 5

## 2023-08-06 MED ORDER — HEPARIN SOD (PORK) LOCK FLUSH 100 UNIT/ML IV SOLN
500.0000 [IU] | Freq: Once | INTRAVENOUS | Status: AC | PRN
Start: 2023-08-06 — End: 2023-08-06
  Administered 2023-08-06: 500 [IU]

## 2023-08-06 MED ORDER — DEXTROSE 5 % IV SOLN: Freq: Once | INTRAVENOUS | Status: AC

## 2023-08-06 MED ORDER — CETIRIZINE HCL 10 MG/ML IV SOLN
10.0000 mg | Freq: Once | INTRAVENOUS | Status: AC
  Administered 2023-08-06: 10 mg via INTRAVENOUS
  Filled 2023-08-06: qty 1

## 2023-08-06 MED ORDER — ACETAMINOPHEN 325 MG PO TABS
650.0000 mg | ORAL_TABLET | Freq: Once | ORAL | Status: AC
  Administered 2023-08-06: 650 mg via ORAL
  Filled 2023-08-06: qty 2

## 2023-08-06 MED ORDER — OXYCODONE-ACETAMINOPHEN 7.5-325 MG PO TABS: 1.0000 | ORAL_TABLET | Freq: Four times a day (QID) | ORAL | 0 refills | Status: DC | PRN

## 2023-08-06 NOTE — Progress Notes (Signed)
Patient has been examined by Dr. Katragadda. Vital signs and labs have been reviewed by MD - ANC, Creatinine, LFTs, hemoglobin, and platelets are within treatment parameters per M.D. - pt may proceed with treatment.  Primary RN and pharmacy notified.  

## 2023-08-06 NOTE — Progress Notes (Signed)
Patient presents today for treatment, patient okay for treatment today per Dr. Ellin Saba. Patient tolerated therapy with no complaints voiced. Side effects with management reviewed with understanding verbalized. Port site clean and dry with no bruising or swelling noted at site. Good blood return noted before and after administration of therapy. Band aid applied. Patient left in satisfactory condition with VSS and no s/s of distress noted.

## 2023-08-06 NOTE — Patient Instructions (Signed)
[facility] at [facility] Discharge Instructions   You were seen and examined today by Dr. Ellin Saba.  He reviewed the results of your lab work which are normal/stable.   He reviewed the results of your echocardiogram of your heart which was normal.  We will proceed with your treatment today.   Return as scheduled.    Thank you for choosing [facility] at [facility] to provide your oncology and hematology care.  To afford each patient quality time with our provider, please arrive at least 15 minutes before your scheduled appointment time.   If you have a lab appointment with the Cancer Center please come in thru the Main Entrance and check in at the main information desk.  You need to re-schedule your appointment should you arrive 10 or more minutes late.  We strive to give you quality time with our providers, and arriving late affects you and other patients whose appointments are after yours.  Also, if you no show three or more times for appointments you may be dismissed from the clinic at the providers discretion.     Again, thank you for choosing [facility].  Our hope is that these requests will decrease the amount of time that you wait before being seen by our physicians.       _____________________________________________________________  Should you have questions after your visit to [facility], please contact our office at [phone] and follow the prompts.  Our office hours are 8:00 a.m. and 4:30 p.m. Monday - Friday.  Please note that voicemails left after 4:00 p.m. may not be returned until the following business day.  We are closed weekends and major holidays.  You do have access to a nurse 24-7, just call the main number to the clinic [phone] and do not press any options, hold on the line and a nurse will answer the phone.    For prescription refill requests, have your pharmacy contact our office  and allow 72 hours.    Due to Covid, you will need to wear a mask upon entering the hospital. If you do not have a mask, a mask will be given to you at the Main Entrance upon arrival. For doctor visits, patients may have 1 support person age 80 or older with them. For treatment visits, patients can not have anyone with them due to social distancing guidelines and our immunocompromised population.

## 2023-08-06 NOTE — Progress Notes (Signed)
Patients port flushed without difficulty.  Good blood return noted with no bruising or swelling noted at site.  Patient remains accessed for treatment.  

## 2023-08-06 NOTE — Patient Instructions (Signed)
[facility] [place] - [facility]  Discharge Instructions: Thank you for choosing [facility] to provide your oncology and hematology care.  If you have a lab appointment with the Cancer Center - please note that after April 8th, 2024, all labs will be drawn in the cancer center.  You do not have to check in or register with the main entrance as you have in the past but will complete your check-in in the cancer center.  Wear comfortable clothing and clothing appropriate for easy access to any Portacath or PICC line.   We strive to give you quality time with your provider. You may need to reschedule your appointment if you arrive late (15 or more minutes).  Arriving late affects you and other patients whose appointments are after yours.  Also, if you miss three or more appointments without notifying the office, you may be dismissed from the clinic at the provider's discretion.      For prescription refill requests, have your pharmacy contact our office and allow 72 hours for refills to be completed.    Today you received the following chemotherapy and/or immunotherapy agents Enhertu, return as scheduled.   To help prevent nausea and vomiting after your treatment, we encourage you to take your nausea medication as directed.  BELOW ARE SYMPTOMS THAT SHOULD BE REPORTED IMMEDIATELY: *FEVER GREATER THAN 100.4 F (38 C) OR HIGHER *CHILLS OR SWEATING *NAUSEA AND VOMITING THAT IS NOT CONTROLLED WITH YOUR NAUSEA MEDICATION *UNUSUAL SHORTNESS OF BREATH *UNUSUAL BRUISING OR BLEEDING *URINARY PROBLEMS (pain or burning when urinating, or frequent urination) *BOWEL PROBLEMS (unusual diarrhea, constipation, pain near the anus) TENDERNESS IN MOUTH AND THROAT WITH OR WITHOUT PRESENCE OF ULCERS (sore throat, sores in mouth, or a toothache) UNUSUAL RASH, SWELLING OR PAIN  UNUSUAL VAGINAL DISCHARGE OR ITCHING   Items with * indicate a potential emergency and  should be followed up as soon as possible or go to the Emergency Department if any problems should occur.  Please show the CHEMOTHERAPY ALERT CARD or IMMUNOTHERAPY ALERT CARD at check-in to the Emergency Department and triage nurse.  Should you have questions after your visit or need to cancel or reschedule your appointment, please contact [facility] [place] - A DEPT OF [facility] [phone]  and follow the prompts.  Office hours are 8:00 a.m. to 4:30 p.m. Monday - Friday. Please note that voicemails left after 4:00 p.m. may not be returned until the following business day.  We are closed weekends and major holidays. You have access to a nurse at all times for urgent questions. Please call the main number to the clinic [phone] and follow the prompts.  For any non-urgent questions, you may also contact your provider using MyChart. We now offer e-Visits for anyone 51 and older to request care online for non-urgent symptoms. For details visit [email or web address].   Also download the MyChart app! Go to the app store, search "MyChart", open the app, select [place], and log in with your MyChart username and password.

## 2023-08-07 LAB — CANCER ANTIGEN 15-3: CA 15-3: 14.6 U/mL (ref 0.0–25.0)

## 2023-08-07 LAB — CANCER ANTIGEN 27.29: CA 27.29: 23.6 U/mL (ref 0.0–38.6)

## 2023-08-12 ENCOUNTER — Other Ambulatory Visit: Payer: Self-pay

## 2023-08-17 ENCOUNTER — Telehealth: Payer: Self-pay | Admitting: Oncology

## 2023-08-17 ENCOUNTER — Ambulatory Visit (HOSPITAL_COMMUNITY): Payer: MEDICAID

## 2023-08-17 NOTE — Telephone Encounter (Signed)
Patient called in to cancel MRI scheduled for today @1200 ; provide patient with correct a number to call and cancel the appt.

## 2023-08-20 ENCOUNTER — Inpatient Hospital Stay: Payer: MEDICAID

## 2023-08-24 ENCOUNTER — Ambulatory Visit: Payer: MEDICAID | Admitting: Radiology

## 2023-08-26 ENCOUNTER — Ambulatory Visit: Payer: MEDICAID | Admitting: Urology

## 2023-08-27 ENCOUNTER — Inpatient Hospital Stay: Payer: MEDICAID

## 2023-08-27 ENCOUNTER — Inpatient Hospital Stay: Payer: MEDICAID | Attending: Hematology

## 2023-08-27 VITALS — BP 137/79 | HR 92 | Temp 99.4°F | Resp 20

## 2023-08-27 DIAGNOSIS — C7951 Secondary malignant neoplasm of bone: Secondary | ICD-10-CM | POA: Diagnosis not present

## 2023-08-27 DIAGNOSIS — Z171 Estrogen receptor negative status [ER-]: Secondary | ICD-10-CM | POA: Insufficient documentation

## 2023-08-27 DIAGNOSIS — C787 Secondary malignant neoplasm of liver and intrahepatic bile duct: Secondary | ICD-10-CM | POA: Insufficient documentation

## 2023-08-27 DIAGNOSIS — C7931 Secondary malignant neoplasm of brain: Secondary | ICD-10-CM | POA: Diagnosis not present

## 2023-08-27 DIAGNOSIS — C50111 Malignant neoplasm of central portion of right female breast: Secondary | ICD-10-CM | POA: Diagnosis not present

## 2023-08-27 DIAGNOSIS — C50911 Malignant neoplasm of unspecified site of right female breast: Secondary | ICD-10-CM

## 2023-08-27 DIAGNOSIS — Z5112 Encounter for antineoplastic immunotherapy: Secondary | ICD-10-CM | POA: Insufficient documentation

## 2023-08-27 DIAGNOSIS — Z1731 Human epidermal growth factor receptor 2 positive status: Secondary | ICD-10-CM | POA: Insufficient documentation

## 2023-08-27 LAB — COMPREHENSIVE METABOLIC PANEL
ALT: 27 U/L (ref 0–44)
AST: 27 U/L (ref 15–41)
Albumin: 3.8 g/dL (ref 3.5–5.0)
Alkaline Phosphatase: 64 U/L (ref 38–126)
Anion gap: 8 (ref 5–15)
BUN: 21 mg/dL — ABNORMAL HIGH (ref 6–20)
CO2: 26 mmol/L (ref 22–32)
Calcium: 9.1 mg/dL (ref 8.9–10.3)
Chloride: 104 mmol/L (ref 98–111)
Creatinine, Ser: 0.69 mg/dL (ref 0.44–1.00)
GFR, Estimated: >60 mL/min (ref >60)
Glucose, Bld: 96 mg/dL (ref 70–99)
Potassium: 3.6 mmol/L (ref 3.5–5.1)
Sodium: 138 mmol/L (ref 135–145)
Total Bilirubin: <0.2 mg/dL (ref 0.0–1.2)
Total Protein: 7 g/dL (ref 6.5–8.1)

## 2023-08-27 LAB — CBC WITH DIFFERENTIAL/PLATELET
Abs Immature Granulocytes: 0.01 10*3/uL (ref 0.00–0.07)
Basophils Absolute: 0.1 10*3/uL (ref 0.0–0.1)
Basophils Relative: 1 %
Eosinophils Absolute: 0.1 10*3/uL (ref 0.0–0.5)
Eosinophils Relative: 1 %
HCT: 36.7 % (ref 36.0–46.0)
Hemoglobin: 12 g/dL (ref 12.0–15.0)
Immature Granulocytes: 0 %
Lymphocytes Relative: 23 %
Lymphs Abs: 1.3 10*3/uL (ref 0.7–4.0)
MCH: 33.2 pg (ref 26.0–34.0)
MCHC: 32.7 g/dL (ref 30.0–36.0)
MCV: 101.7 fL — ABNORMAL HIGH (ref 80.0–100.0)
Monocytes Absolute: 0.8 10*3/uL (ref 0.1–1.0)
Monocytes Relative: 15 %
Neutro Abs: 3.4 10*3/uL (ref 1.7–7.7)
Neutrophils Relative %: 60 %
Platelets: 299 10*3/uL (ref 150–400)
RBC: 3.61 MIL/uL — ABNORMAL LOW (ref 3.87–5.11)
RDW: 13.8 % (ref 11.5–15.5)
WBC: 5.7 10*3/uL (ref 4.0–10.5)
nRBC: 0 % (ref 0.0–0.2)

## 2023-08-27 LAB — MAGNESIUM: Magnesium: 2 mg/dL (ref 1.7–2.4)

## 2023-08-27 MED ORDER — DEXTROSE 5 % IV SOLN
5.4000 mg/kg | Freq: Once | INTRAVENOUS | Status: AC
  Administered 2023-08-27: 300 mg via INTRAVENOUS
  Filled 2023-08-27: qty 15

## 2023-08-27 MED ORDER — CETIRIZINE HCL 10 MG/ML IV SOLN
10.0000 mg | Freq: Once | INTRAVENOUS | Status: AC
  Administered 2023-08-27: 10 mg via INTRAVENOUS
  Filled 2023-08-27: qty 1

## 2023-08-27 MED ORDER — DEXAMETHASONE SODIUM PHOSPHATE 10 MG/ML IJ SOLN
10.0000 mg | Freq: Once | INTRAMUSCULAR | Status: AC
Start: 2023-08-27 — End: 2023-08-27
  Administered 2023-08-27: 10 mg via INTRAVENOUS
  Filled 2023-08-27: qty 1

## 2023-08-27 MED ORDER — ACETAMINOPHEN 325 MG PO TABS
650.0000 mg | ORAL_TABLET | Freq: Once | ORAL | Status: AC
  Administered 2023-08-27: 650 mg via ORAL
  Filled 2023-08-27: qty 2

## 2023-08-27 MED ORDER — DEXTROSE 5 % IV SOLN: Freq: Once | INTRAVENOUS | Status: AC

## 2023-08-27 MED ORDER — SODIUM CHLORIDE 0.9% FLUSH
10.0000 mL | INTRAVENOUS | Status: DC | PRN
  Administered 2023-08-27: 10 mL

## 2023-08-27 MED ORDER — PALONOSETRON HCL INJECTION 0.25 MG/5ML
0.2500 mg | Freq: Once | INTRAVENOUS | Status: AC
  Administered 2023-08-27: 0.25 mg via INTRAVENOUS
  Filled 2023-08-27: qty 5

## 2023-08-27 MED ORDER — SODIUM CHLORIDE 0.9 % IV SOLN
150.0000 mg | Freq: Once | INTRAVENOUS | Status: AC
  Administered 2023-08-27: 150 mg via INTRAVENOUS
  Filled 2023-08-27: qty 150

## 2023-08-27 MED ORDER — HEPARIN SOD (PORK) LOCK FLUSH 100 UNIT/ML IV SOLN
500.0000 [IU] | Freq: Once | INTRAVENOUS | Status: AC | PRN
Start: 2023-08-27 — End: 2023-08-27
  Administered 2023-08-27: 500 [IU]

## 2023-08-27 NOTE — Patient Instructions (Signed)
 [facility] [place] [facility]  Discharge Instructions: Thank you for choosing [facility] to provide your oncology and hematology care.  If you have a lab appointment with the Cancer Center - please note that after April 8th, 2024, all labs will be drawn in the cancer center.  You do not have to check in or register with the main entrance as you have in the past but will complete your check-in in the cancer center.  Wear comfortable clothing and clothing appropriate for easy access to any Portacath or PICC line.   We strive to give you quality time with your provider. You may need to reschedule your appointment if you arrive late (15 or more minutes).  Arriving late affects you and other patients whose appointments are after yours.  Also, if you miss three or more appointments without notifying the office, you may be dismissed from the clinic at the provider's discretion.      For prescription refill requests, have your pharmacy contact our office and allow 72 hours for refills to be completed.    Today you received the following chemotherapy and/or immunotherapy agents Enhertu. Fam-Trastuzumab Deruxtecan Injection What is this medication? FAM-TRASTUZUMAB DERUXTECAN (fam-tras TOOZ eu mab DER ux TEE kan) treats some types of cancer. It works by blocking a protein that causes cancer cells to grow and multiply. This helps to slow or stop the spread of cancer cells. This medicine may be used for other purposes; ask your health care provider or pharmacist if you have questions. COMMON BRAND NAME(S): ENHERTU What should I tell my care team before I take this medication? They need to know if you have any of these conditions: Heart disease Heart failure Infection, especially a viral infection, such as chickenpox, cold sores, or herpes Liver disease Lung or breathing disease, such as asthma or COPD An unusual or allergic reaction to fam-trastuzumab  deruxtecan, other medications, foods, dyes, or preservatives Pregnant or trying to get pregnant Breast-feeding How should I use this medication? This medication is injected into a vein. It is given by your care team in a hospital or clinic setting. A special MedGuide will be given to you before each treatment. Be sure to read this information carefully each time. Talk to your care team about the use of this medication in children. Special care may be needed. Overdosage: If you think you have taken too much of this medicine contact a poison control center or emergency room at once. NOTE: This medicine is only for you. Do not share this medicine with others. What if I miss a dose? It is important not to miss your dose. Call your care team if you are unable to keep an appointment. What may interact with this medication? Interactions are not expected. This list may not describe all possible interactions. Give your health care provider a list of all the medicines, herbs, non-prescription drugs, or dietary supplements you use. Also tell them if you smoke, drink alcohol, or use illegal drugs. Some items may interact with your medicine. What should I watch for while using this medication? Visit your care team for regular checks on your progress. Tell your care team if your symptoms do not start to get better or if they get worse. Your condition will be monitored carefully while you are receiving this medication. Do not become pregnant while taking this medication or for 7 months after stopping it. Women should inform their care team  if they wish to become pregnant or think they might be pregnant. Men should not father a child while taking this medication and for 4 months after stopping it. There is potential for serious side effects to an unborn child. Talk to your care team for more information. Do not breast-feed an infant while taking this medication or for 7 months after the last dose. This medication  has caused decreased sperm counts in some men. This may make it more difficult to father a child. Talk to your care team if you are concerned about your fertility. This medication may increase your risk to bruise or bleed. Call your care team if you notice any unusual bleeding. Be careful brushing or flossing your teeth or using a toothpick because you may get an infection or bleed more easily. If you have any dental work done, tell your dentist you are receiving this medication. This medication may cause dry eyes and blurred vision. If you wear contact lenses, you may feel some discomfort. Lubricating eye drops may help. See your care team if the problem does not go away or is severe. This medication may increase your risk of getting an infection. Call your care team for advice if you get a fever, chills, sore throat, or other symptoms of a cold or flu. Do not treat yourself. Try to avoid being around people who are sick. Avoid taking medications that contain aspirin, acetaminophen, ibuprofen, naproxen, or ketoprofen unless instructed by your care team. These medications may hide a fever. What side effects may I notice from receiving this medication? Side effects that you should report to your care team as soon as possible: Allergic reactions--skin rash, itching, hives, swelling of the face, lips, tongue, or throat Dry cough, shortness of breath or trouble breathing Infection--fever, chills, cough, sore throat, wounds that don't heal, pain or trouble when passing urine, general feeling of discomfort or being unwell Heart failure--shortness of breath, swelling of the ankles, feet, or hands, sudden weight gain, unusual weakness or fatigue Unusual bruising or bleeding Side effects that usually do not require medical attention (report these to your care team if they continue or are bothersome): Constipation Diarrhea Hair loss Muscle pain Nausea Vomiting This list may not describe all possible side  effects. Call your doctor for medical advice about side effects. You may report side effects to FDA at 1-800-FDA-1088. Where should I keep my medication? This medication is given in a hospital or clinic. It will not be stored at home. NOTE: This sheet is a summary. It may not cover all possible information. If you have questions about this medicine, talk to your doctor, pharmacist, or health care provider.  2024 Elsevier/Gold Standard (2021-05-21 00:00:00)       To help prevent nausea and vomiting after your treatment, we encourage you to take your nausea medication as directed.  BELOW ARE SYMPTOMS THAT SHOULD BE REPORTED IMMEDIATELY: *FEVER GREATER THAN 100.4 F (38 C) OR HIGHER *CHILLS OR SWEATING *NAUSEA AND VOMITING THAT IS NOT CONTROLLED WITH YOUR NAUSEA MEDICATION *UNUSUAL SHORTNESS OF BREATH *UNUSUAL BRUISING OR BLEEDING *URINARY PROBLEMS (pain or burning when urinating, or frequent urination) *BOWEL PROBLEMS (unusual diarrhea, constipation, pain near the anus) TENDERNESS IN MOUTH AND THROAT WITH OR WITHOUT PRESENCE OF ULCERS (sore throat, sores in mouth, or a toothache) UNUSUAL RASH, SWELLING OR PAIN  UNUSUAL VAGINAL DISCHARGE OR ITCHING   Items with * indicate a potential emergency and should be followed up as soon as possible or go to the Emergency  Department if any problems should occur.  Please show the CHEMOTHERAPY ALERT CARD or IMMUNOTHERAPY ALERT CARD at check-in to the Emergency Department and triage nurse.  Should you have questions after your visit or need to cancel or reschedule your appointment, please contact [facility] [place] - A DEPT OF [facility] [phone]  and follow the prompts.  Office hours are 8:00 a.m. to 4:30 p.m. Monday - Friday. Please note that voicemails left after 4:00 p.m. may not be returned until the following business day.  We are closed weekends and major holidays. You have access to a nurse at all times for urgent  questions. Please call the main number to the clinic [phone] and follow the prompts.  For any non-urgent questions, you may also contact your provider using MyChart. We now offer e-Visits for anyone 70 and older to request care online for non-urgent symptoms. For details visit [email or web address].   Also download the MyChart app! Go to the app store, search "MyChart", open the app, select [place], and log in with your MyChart username and password.

## 2023-08-27 NOTE — Progress Notes (Signed)
 Patient presents today for Enhertu. Last echo 08-03-2023. EF is 60-65 %. Vital signs and labs within parameters for treatment.

## 2023-08-27 NOTE — Progress Notes (Signed)

## 2023-08-28 ENCOUNTER — Ambulatory Visit (HOSPITAL_COMMUNITY): Payer: MEDICAID

## 2023-08-31 ENCOUNTER — Other Ambulatory Visit: Payer: Self-pay | Admitting: *Deleted

## 2023-08-31 DIAGNOSIS — G893 Neoplasm related pain (acute) (chronic): Secondary | ICD-10-CM

## 2023-08-31 DIAGNOSIS — F419 Anxiety disorder, unspecified: Secondary | ICD-10-CM

## 2023-08-31 DIAGNOSIS — Z1731 Human epidermal growth factor receptor 2 positive status: Secondary | ICD-10-CM

## 2023-08-31 MED ORDER — ALPRAZOLAM 0.5 MG PO TABS: ORAL_TABLET | ORAL | 0 refills | Status: DC

## 2023-08-31 MED ORDER — OXYCODONE-ACETAMINOPHEN 7.5-325 MG PO TABS: 1.0000 | ORAL_TABLET | Freq: Four times a day (QID) | ORAL | 0 refills | Status: DC | PRN

## 2023-09-02 ENCOUNTER — Ambulatory Visit (HOSPITAL_COMMUNITY): Admission: RE | Admit: 2023-09-02 | Payer: MEDICAID | Source: Ambulatory Visit

## 2023-09-02 ENCOUNTER — Ambulatory Visit: Payer: MEDICAID | Admitting: Urology

## 2023-09-03 ENCOUNTER — Telehealth: Payer: Self-pay | Admitting: Radiation Therapy

## 2023-09-03 NOTE — Telephone Encounter (Signed)
Left a vm requesting a call back about her missed brain MRI appointment.   [name] R.T.(R)(T) Radiation Special Procedures Navigator

## 2023-09-04 ENCOUNTER — Ambulatory Visit (HOSPITAL_COMMUNITY): Admission: RE | Admit: 2023-09-04 | Payer: MEDICAID | Source: Ambulatory Visit

## 2023-09-09 ENCOUNTER — Ambulatory Visit: Payer: MEDICAID | Admitting: Urology

## 2023-09-10 ENCOUNTER — Inpatient Hospital Stay: Payer: MEDICAID

## 2023-09-10 ENCOUNTER — Inpatient Hospital Stay: Payer: MEDICAID | Admitting: Hematology

## 2023-09-14 ENCOUNTER — Ambulatory Visit (HOSPITAL_COMMUNITY)
Admission: RE | Admit: 2023-09-14 | Discharge: 2023-09-14 | Disposition: A | Discharge status: Home / Self Care | Payer: MEDICAID | Source: Ambulatory Visit | Attending: Hematology | Admitting: Hematology

## 2023-09-14 ENCOUNTER — Encounter (HOSPITAL_COMMUNITY): Payer: Self-pay

## 2023-09-14 ENCOUNTER — Ambulatory Visit (HOSPITAL_COMMUNITY)
Admission: RE | Admit: 2023-09-14 | Discharge: 2023-09-14 | Disposition: A | Discharge status: Home / Self Care | Payer: MEDICAID | Source: Ambulatory Visit | Attending: Radiation Oncology | Admitting: Radiation Oncology

## 2023-09-14 DIAGNOSIS — Z1731 Human epidermal growth factor receptor 2 positive status: Secondary | ICD-10-CM

## 2023-09-14 DIAGNOSIS — C7931 Secondary malignant neoplasm of brain: Secondary | ICD-10-CM | POA: Insufficient documentation

## 2023-09-14 DIAGNOSIS — C50911 Malignant neoplasm of unspecified site of right female breast: Secondary | ICD-10-CM | POA: Diagnosis present

## 2023-09-14 MED ORDER — GADOBUTROL 1 MMOL/ML IV SOLN
6.0000 mL | Freq: Once | INTRAVENOUS | Status: AC | PRN
  Administered 2023-09-14: 6 mL via INTRAVENOUS

## 2023-09-15 ENCOUNTER — Other Ambulatory Visit: Payer: Self-pay

## 2023-09-16 NOTE — Progress Notes (Incomplete)
Radiation Oncology         [phone] ________________________________  Name: Sara [name] MRN: [id number]  Date: 09/17/2023  DOB: 1975-08-31  Post Treatment Note  CC: Toma Deiters, MD  Toma Deiters, MD  Diagnosis:   48 yo woman with brain metastases secondary to metastatic breast cancer.   Interval Since Last Radiation:  4 months  05/13/23//SRS:  The following two targets were treated to 20 Gy in a single fraction of SRS:    12/05/22 - 12/19/22:  The brain received 20 Gy in a single fraction to her two intact metastases in the right parietal lobe (1.3 cm) and falx cerebri (1.2 cm) and 30 Gy in 10 fractions to the resection cavity in the right cerebellum/posterior fossa.    Plan Name: Brain_SRS Site: Brain Technique: SBRT/SRT-IMRT Mode: Photon Dose Per Fraction: 20 Gy Prescribed Dose (Delivered / Prescribed): 20 Gy / 20 Gy Prescribed Fxs (Delivered / Prescribed): 1 / 1   Plan Name: Brain_PstFos Site: Brain Technique: 3D Mode: Photon Dose Per Fraction: 3 Gy Prescribed Dose (Delivered / Prescribed): 30 Gy / 30 Gy Prescribed Fxs (Delivered / Prescribed): 10 / 10  Narrative:  I spoke with the patient to conduct her routine scheduled 3 month follow up visit to review her MRI brain results via telephone to spare the patient unnecessary potential exposure in the healthcare setting during the current COVID-19 pandemic.  The patient was notified in advance and gave permission to proceed with this visit format.    She tolerated the recent [facility] treatment well, without any acute ill side effects.  She had her post-treatment MRI brain scan on 09/14/23 and this shows a stable appearance of the brain as compared to 05/02/2023 with postoperative changes in the right cerebellum without evidence of residual or recurrent viable tumor and a stable appearance of the treated metastasis in the medial right parietal lobe. Additionally, there is a stable punctate focus of enhancement in the right  caudate head and along the surface of the right parietal lobe but no new lesions noted. We reviewed these findings by telephone today.                 She continues with Enhertu therapy under the care of Dr. Ellin Saba. She started this on 01/01/2023 and continues to tolerate this well.            On review of systems, the patient states that she is doing well overall aside from significant anxiety around the time of her scans. She does have some imbalance for approximately 5 days following each of her systemic treatments but this resolves spontaneously and then recurs with the next cycle of treatment. She denies any more frequent headaches or any nausea, vomiting, visual disturbances, extremity weakness, or other concerns today.    ALLERGIES:  is allergic to hydrocodone, sulfa antibiotics, and morphine and codeine.  Meds: Current Outpatient Medications  Medication Sig Dispense Refill   ALPRAZolam (XANAX) 0.5 MG tablet TAKE 1 TABLET BY MOUTH AT BEDTIME AS NEEDED FOR ANXIETY 30 tablet 0   docusate sodium (COLACE) 100 MG capsule Take 1 capsule (100 mg total) by mouth 2 (two) times daily.     furosemide (LASIX) 20 MG tablet Take 1 tablet (20 mg total) by mouth daily as needed. 30 tablet 0   lidocaine-prilocaine (EMLA) cream Apply 1 Application topically as needed. 30 g 0   mirtazapine (REMERON) 15 MG tablet Take 1 tablet (15 mg total) by mouth at bedtime.  30 tablet 2   ondansetron (ZOFRAN) 8 MG tablet TAKE 1 TABLET BY MOUTH EVERY 8 HOURS AS NEEDED FOR NAUSEA FOR VOMITING 90 tablet 5   oxyCODONE-acetaminophen (PERCOCET) 7.5-325 MG tablet Take 1 tablet by mouth every 6 (six) hours as needed for severe pain (pain score 7-10). 84 tablet 0   pantoprazole (PROTONIX) 20 MG tablet Take 1 tablet (20 mg total) by mouth daily. 30 tablet 5   prochlorperazine (COMPAZINE) 10 MG tablet Take 1 tablet (10 mg total) by mouth every 6 (six) hours as needed for nausea or vomiting. 30 tablet 5   No current  facility-administered medications for this visit.    Physical Findings:  vitals were not taken for this visit.   /10 Unable to assess due to telephone follow-up visit format.  Lab Findings: Lab Results  Component Value Date   WBC 5.7 08/27/2023   HGB 12.0 08/27/2023   HCT 36.7 08/27/2023   MCV 101.7 (H) 08/27/2023   PLT 299 08/27/2023     Radiographic Findings: MR Brain W Wo Contrast Result Date: 09/15/2023 CLINICAL DATA:  Follow-up treated brain metastases. EXAM: MRI HEAD WITHOUT AND WITH CONTRAST TECHNIQUE: Multiplanar, multiecho pulse sequences of the brain and surrounding structures were obtained without and with intravenous contrast. CONTRAST:  6mL GADAVIST GADOBUTROL 1 MMOL/ML IV SOLN COMPARISON:  05/02/2023 FINDINGS: Brain: Postoperative changes in the right cerebellum appears stable without evidence of residual or recurrent viable tumor. No new posterior fossa lesion. No abnormal finding in the left hemisphere. Treated metastasis in the medial right parietal lobe is stable with the persistent enhancing focus measuring 5 x 6 mm, axial image 204. Persistent punctate focus of enhancement in the right caudate head, axial image 174, without change. No change in a punctate focus of enhancement along the surface of the right parietal lobe, axial image 201. Other small punctate focus question above that on the prior study is not confirmed on today's exam. No new lesion. No hydrocephalus or extra-axial collection. Vascular: Major vessels at the base of the brain show flow. Skull and upper cervical spine: Negative Sinuses/Orbits: Clear/normal Other: None IMPRESSION: 1. Stable appearance of the brain since 05/02/2023. Postoperative changes in the right cerebellum without evidence of residual or recurrent viable tumor. 2. Stable treated metastasis in the medial right parietal lobe. 3. Stable punctate focus of enhancement in the right caudate head. 4. Stable punctate focus of enhancement along the  surface of the right parietal lobe. 5. No new lesion. Electronically Signed   By: Paulina Fusi M.D.   On: 09/15/2023 15:28    Impression/Plan: 1. 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  She has recovered well from the effects of her recent [facility] treatment and remains without complaints.  Her most recent posttreatment MRI brain scan from 09/14/2023 shows an overall stable appearance with no new lesions so we discussed the plan to continue with serial MRI brain scans every 3 months to continue to monitor for any evidence of disease recurrence or progression.  I will plan to call her with the results following each scan but she knows that she is welcome to call at anytime in the interim with any questions or concerns.  She appears to have a good understanding of these recommendations and is comfortable and in agreement with the stated plan.   I personally spent 30 minutes in this encounter including chart review, reviewing radiological studies, telephone conversation with the patient, entering orders, coordinating her care and completing documentation.  Marguarite Arbour, PA-C

## 2023-09-17 ENCOUNTER — Inpatient Hospital Stay: Payer: MEDICAID | Admitting: Dietician

## 2023-09-17 ENCOUNTER — Encounter: Payer: Self-pay | Admitting: Urology

## 2023-09-17 ENCOUNTER — Inpatient Hospital Stay: Payer: MEDICAID

## 2023-09-17 ENCOUNTER — Ambulatory Visit
Admission: RE | Admit: 2023-09-17 | Discharge: 2023-09-17 | Disposition: A | Discharge status: Home / Self Care | Payer: MEDICAID | Source: Ambulatory Visit | Attending: Urology | Admitting: Urology

## 2023-09-17 ENCOUNTER — Inpatient Hospital Stay: Payer: MEDICAID | Admitting: Hematology

## 2023-09-17 DIAGNOSIS — C50911 Malignant neoplasm of unspecified site of right female breast: Secondary | ICD-10-CM

## 2023-09-17 NOTE — Progress Notes (Incomplete)
[facility] [address] [place] [address]    Clinic Day:  09/17/2023  Referring physician: Toma Deiters, MD  Patient Care Team: Toma Deiters, MD as PCP - General (Internal Medicine) Doreatha Massed, MD as Medical Oncologist (Medical Oncology) Therese Sarah, RN as Oncology Nurse Navigator (Oncology)   ASSESSMENT & PLAN:   Assessment: 1. Her2+ metastatic breast cancer: - She reports feeling a knot in the right breast in July 2022.  She felt the same lump in September which has increased slightly in November.  In the last 3 months, it has increased in size from a quarter to size of an orange. - Mammogram/ultrasound on 10/01/2021: Large irregular mass involving the entire central right breast measuring 4.9 x 2.4 x 4 cm at 12:00.  At least 3 lymph nodes in the right axilla.  Overlying skin thickening and generalized erythema and marked tenderness. - Right breast mass and axillary lymph node biopsy on 10/08/2021: - Pathology: Poorly differentiated ductal carcinoma, grade 3, HER2 3+ positive, ER 5% strong staining intensity, PR negative, Ki-67 25%. - PET scan (11/30/2021): Hypermetabolic tumor involving right breast, metastatic mediastinal and hilar lymphadenopathy, hepatic metastatic disease, diffuse lytic bone lesions. - 4 cycles of THP from 12/03/2021 through 02/25/2022 - Cerebellar mass resection (11/12/2022): Metastatic carcinoma with tumor cells positive for CK7, GATA3, indicating breast primary.  ER and CK20 are negative.  HER2 is 3+. - Enhertu started on 01/01/2023   2. Social/family history: - She is currently living with her father, youngest daughter and sister.  She has 2 daughters ages 57 and 22.  She has worked in Journalist, newspaper previously.  She quit smoking 3 months ago.  Prior to that she smoked a pack a day for 4 to 5 years and less than half pack a day since 2011.  She vapes occasionally at this time. - Paternal great grandmother had  lung cancer.  Paternal aunt had lung cancer at age 24.  Paternal grandmother had breast cancer in her 30s.  Paternal grandfather had colon cancer.  Maternal grandfather had colon cancer.    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  Also improvement in bone lesions.  Incidental appendiceal mucocele was seen. - She is asymptomatic.  She is tolerating Enhertu reasonably well. - Reviewed labs today: Normal LFTs and creatinine.  CBC normal. - She may proceed with her treatment today and in 3 weeks.  RTC 6 weeks for follow-up with repeat CT CAP with contrast and tumor markers. - She will have 3T MRI of the brain done on 08/17/2023.  2.  Difficulty falling asleep: - Continue mirtazapine 15 mg at bedtime which is helping.  3.  Anxiety: - Continue Xanax 0.5 mg daily as needed.  This is well-controlled.  4.  Chest pain, back pain, right ankle pain: - She ran out of her pills few days ago as they were given for 3 weeks.  Urine drug screen today shows positivity for benzodiazepines and negative for opiates. - She will be given refill for Percocet 7.5 mg #84 today.   5.  High risk drug monitoring: - Reviewed 2D echo from 08/03/2023, LVEF 60 to 65% and stable.    No orders of the defined types were placed in this encounter.     Alben Deeds Teague,acting as a Neurosurgeon for Doreatha Massed, MD.,have documented all relevant documentation on the behalf  of Doreatha Massed, MD,as directed by  Doreatha Massed, MD while in the presence of Doreatha Massed, MD.  ***    [place] R Teague   1/30/20257:59 AM  CHIEF COMPLAINT:   Diagnosis: Her2+ metastatic right breast cancer    Cancer Staging  HER2-positive carcinoma of right breast [facility]) Staging form: Breast, AJCC 8th Edition - Clinical stage from 10/28/2021: Stage IV (cT4d, cN1, cM1, G3, ER+, PR-, HER2+) - Unsigned    Prior Therapy: 1.  DOCEtaxel + Trastuzumab + Pertuzumab (THP) q21d x 4 cycles  2. SRS to brain metastases-- 10 fractions from 12/05/22 through 12/19/22, 1 fraction on 05/13/23  Current Therapy:  Enhertu    HISTORY OF PRESENT ILLNESS:   Oncology History  HER2-positive carcinoma of right breast (HCC)  10/28/2021 Initial Diagnosis   Breast cancer, right breast (HCC)   12/03/2021 - 02/25/2022 Chemotherapy   Patient is on Treatment Plan : BREAST DOCEtaxel + Trastuzumab + Pertuzumab (THP) q21d x 8 cycles / Trastuzumab + Pertuzumab q21d x 4 cycles     01/01/2023 -  Chemotherapy   Patient is on Treatment Plan : BREAST METASTATIC Fam-Trastuzumab Deruxtecan-nxki (Enhertu) (5.4) q21d        INTERVAL HISTORY:   Soo is a 48 y.o. female presenting to clinic today for follow up of Her2+ metastatic right breast cancer. She was last seen by me on 06/10/23.  Since her last visit, she underwent brain MRI on 09/14/23 that found: stable appearance of the brain; postoperative changes in the right cerebellum without evidence of residual or recurrent viable tumor; and no new lesion.  Today, she states that she is doing well overall. [name]. Her energy level is at ***%. She is accompanied by a family member.   PAST MEDICAL HISTORY:   Past Medical History: Past Medical History:  Diagnosis Date   Anxiety    Back pain    Cancer [facility])     Surgical History: Past Surgical History:  Procedure Laterality Date   CHOLECYSTECTOMY     ORTHOPEDIC SURGERY     PORTACATH PLACEMENT Left 11/18/2021   Procedure: INSERTION PORT-A-CATH;  Surgeon: Lucretia Roers, MD;  Location: [facility];  Service: General;  Laterality: Left;   SUBOCCIPITAL CRANIECTOMY CERVICAL LAMINECTOMY N/A 11/12/2022   Procedure: SUBOCCIPITAL CRANIECTOMY FOR RESECTION OF CEREBELLAR MASS;  Surgeon: Donalee Citrin, MD;  Location: [facility] OR;  Service: Neurosurgery;  Laterality: N/A;    Social History: Social History   Socioeconomic History   Marital  status: Legally Separated    Spouse name: Not on file   Number of children: Not on file   Years of education: Not on file   Highest education level: Not on file  Occupational History   Not on file  Tobacco Use   Smoking status: Former    Current packs/day: 0.00    Average packs/day: 1 pack/day for 5.0 years (5.0 ttl pk-yrs)    Types: Cigarettes    Start date: 11/08/2016    Quit date: 11/08/2021    Years since quitting: 1.8   Smokeless tobacco: Never  Vaping Use   Vaping status: Never Used  Substance and Sexual Activity   Alcohol use: No    Comment: occasionally   Drug use: No   Sexual activity: Yes    Birth control/protection: None  Other Topics Concern   Not on file  Social History Narrative   Not on file   Social Drivers of Health   Financial Resource Strain: High Risk (12/03/2021)  Overall Financial Resource Strain (CARDIA)    Difficulty of Paying Living Expenses: Very hard  Food Insecurity: No Food Insecurity (11/25/2022)   Hunger Vital Sign    Worried About Running Out of Food in the Last Year: Never true    Ran Out of Food in the Last Year: Never true  Transportation Needs: No Transportation Needs (11/25/2022)   PRAPARE - Administrator, Civil Service (Medical): No    Lack of Transportation (Non-Medical): No  Recent Concern: Transportation Needs - Unmet Transportation Needs (11/11/2022)   PRAPARE - Transportation    Lack of Transportation (Medical): Yes    Lack of Transportation (Non-Medical): Yes  Physical Activity: Sufficiently Active (06/17/2021)   Exercise Vital Sign    Days of Exercise per Week: 5 days    Minutes of Exercise per Session: 30 min  Stress: Stress Concern Present (06/17/2021)   [employer] of Occupational Health - Occupational Stress Questionnaire    Feeling of Stress : Rather much  Social Connections: Socially Isolated (06/17/2021)   Social Connection and Isolation Panel [NHANES]    Frequency of Communication with Friends and  Family: Twice a week    Frequency of Social Gatherings with Friends and Family: Never    Attends Religious Services: 1 to 4 times per year    Active Member of [employer] or Organizations: No    Attends Banker Meetings: Never    Marital Status: Divorced  Catering manager Violence: Not At Risk (11/25/2022)   Humiliation, Afraid, Rape, and Kick questionnaire    Fear of Current or Ex-Partner: No    Emotionally Abused: No    Physically Abused: No    Sexually Abused: No    Family History: Family History  Problem Relation Age of Onset   Heart disease Brother    Lung cancer Paternal Grandmother        lung   Cancer Paternal Grandfather    Asthma Daughter    Asthma Daughter     Current Medications:  Current Outpatient Medications:    ALPRAZolam (XANAX) 0.5 MG tablet, TAKE 1 TABLET BY MOUTH AT BEDTIME AS NEEDED FOR ANXIETY, Disp: 30 tablet, Rfl: 0   docusate sodium (COLACE) 100 MG capsule, Take 1 capsule (100 mg total) by mouth 2 (two) times daily., Disp: , Rfl:    furosemide (LASIX) 20 MG tablet, Take 1 tablet (20 mg total) by mouth daily as needed., Disp: 30 tablet, Rfl: 0   lidocaine-prilocaine (EMLA) cream, Apply 1 Application topically as needed., Disp: 30 g, Rfl: 0   mirtazapine (REMERON) 15 MG tablet, Take 1 tablet (15 mg total) by mouth at bedtime., Disp: 30 tablet, Rfl: 2   ondansetron (ZOFRAN) 8 MG tablet, TAKE 1 TABLET BY MOUTH EVERY 8 HOURS AS NEEDED FOR NAUSEA FOR VOMITING, Disp: 90 tablet, Rfl: 5   oxyCODONE-acetaminophen (PERCOCET) 7.5-325 MG tablet, Take 1 tablet by mouth every 6 (six) hours as needed for severe pain (pain score 7-10)., Disp: 84 tablet, Rfl: 0   pantoprazole (PROTONIX) 20 MG tablet, Take 1 tablet (20 mg total) by mouth daily., Disp: 30 tablet, Rfl: 5   prochlorperazine (COMPAZINE) 10 MG tablet, Take 1 tablet (10 mg total) by mouth every 6 (six) hours as needed for nausea or vomiting., Disp: 30 tablet, Rfl: 5   Allergies: Allergies  Allergen  Reactions   Hydrocodone Itching, Nausea And Vomiting, Rash and Other (See Comments)    Hallucinations when given vicodin post crani in 2024   Sulfa Antibiotics Nausea  And Vomiting   Morphine And Codeine Rash    REVIEW OF SYSTEMS:   Review of Systems  Constitutional:  Negative for chills, fatigue and fever.  HENT:   Negative for lump/mass, mouth sores, nosebleeds, sore throat and trouble swallowing.   Eyes:  Negative for eye problems.  Respiratory:  Negative for cough and shortness of breath.   Cardiovascular:  Negative for chest pain, leg swelling and palpitations.  Gastrointestinal:  Negative for abdominal pain, constipation, diarrhea, nausea and vomiting.  Genitourinary:  Negative for bladder incontinence, difficulty urinating, dysuria, frequency, hematuria and nocturia.   Musculoskeletal:  Negative for arthralgias, back pain, flank pain, myalgias and neck pain.  Skin:  Negative for itching and rash.  Neurological:  Negative for dizziness, headaches and numbness.  Hematological:  Does not bruise/bleed easily.  Psychiatric/Behavioral:  Negative for depression, sleep disturbance and suicidal ideas. The patient is not nervous/anxious.   All other systems reviewed and are negative.    VITALS:   There were no vitals taken for this visit.  Wt Readings from Last 3 Encounters:  08/27/23 120 lb 9.5 oz (54.7 kg)  08/06/23 122 lb 6.4 oz (55.5 kg)  07/09/23 123 lb (55.8 kg)    There is no height or weight on file to calculate BMI.  Performance status (ECOG): 1 - Symptomatic but completely ambulatory  PHYSICAL EXAM:   Physical Exam Vitals and nursing note reviewed. Exam conducted with a chaperone present.  Constitutional:      Appearance: Normal appearance.  Cardiovascular:     Rate and Rhythm: Normal rate and regular rhythm.     Pulses: Normal pulses.     Heart sounds: Normal heart sounds.  Pulmonary:     Effort: Pulmonary effort is normal.     Breath sounds: Normal breath  sounds.  Abdominal:     Palpations: Abdomen is soft. There is no hepatomegaly, splenomegaly or mass.     Tenderness: There is no abdominal tenderness.  Musculoskeletal:     Right lower leg: No edema.     Left lower leg: No edema.  Lymphadenopathy:     Cervical: No cervical adenopathy.     Right cervical: No superficial, deep or posterior cervical adenopathy.    Left cervical: No superficial, deep or posterior cervical adenopathy.     Upper Body:     Right upper body: No supraclavicular or axillary adenopathy.     Left upper body: No supraclavicular or axillary adenopathy.  Neurological:     General: No focal deficit present.     Mental Status: She is alert and oriented to person, place, and time.  Psychiatric:        Mood and Affect: Mood normal.        Behavior: Behavior normal.     LABS:      Latest Ref Rng & Units 08/27/2023   12:26 PM 08/06/2023    9:46 AM 07/09/2023    9:37 AM  CBC  WBC 4.0 - 10.5 K/uL 5.7  6.1  9.6   Hemoglobin 12.0 - 15.0 g/dL 16.1  09.6  04.5   Hematocrit 36.0 - 46.0 % 36.7  39.0  39.8   Platelets 150 - 400 K/uL 299  204  158       Latest Ref Rng & Units 08/27/2023   12:26 PM 08/06/2023    9:46 AM 07/09/2023    9:37 AM  CMP  Glucose 70 - 99 mg/dL 96  409  811   BUN 6 - 20  mg/dL 21  15  24    Creatinine 0.44 - 1.00 mg/dL 1.61  0.96  0.45   Sodium 135 - 145 mmol/L 138  136  136   Potassium 3.5 - 5.1 mmol/L 3.6  3.4  3.9   Chloride 98 - 111 mmol/L 104  103  104   CO2 22 - 32 mmol/L 26  23  25    Calcium 8.9 - 10.3 mg/dL 9.1  9.4  9.5   Total Protein 6.5 - 8.1 g/dL 7.0  7.2  7.4   Total Bilirubin 0.0 - 1.2 mg/dL <4.0  0.3  0.6   Alkaline Phos 38 - 126 U/L 64  75  68   AST 15 - 41 U/L 27  22  22    ALT 0 - 44 U/L 27  19  18       No results found for: "CEA1", "CEA" / No results found for: "CEA1", "CEA" No results found for: "PSA1" No results found for: "JWJ191" No results found for: "CAN125"  No results found for: "TOTALPROTELP", "ALBUMINELP",  "A1GS", "A2GS", "BETS", "BETA2SER", "GAMS", "MSPIKE", "SPEI" No results found for: "TIBC", "FERRITIN", "IRONPCTSAT" No results found for: "LDH"   STUDIES:   MR Brain W Wo Contrast Result Date: 09/15/2023 CLINICAL DATA:  Follow-up treated brain metastases. EXAM: MRI HEAD WITHOUT AND WITH CONTRAST TECHNIQUE: Multiplanar, multiecho pulse sequences of the brain and surrounding structures were obtained without and with intravenous contrast. CONTRAST:  6mL GADAVIST GADOBUTROL 1 MMOL/ML IV SOLN COMPARISON:  05/02/2023 FINDINGS: Brain: Postoperative changes in the right cerebellum appears stable without evidence of residual or recurrent viable tumor. No new posterior fossa lesion. No abnormal finding in the left hemisphere. Treated metastasis in the medial right parietal lobe is stable with the persistent enhancing focus measuring 5 x 6 mm, axial image 204. Persistent punctate focus of enhancement in the right caudate head, axial image 174, without change. No change in a punctate focus of enhancement along the surface of the right parietal lobe, axial image 201. Other small punctate focus question above that on the prior study is not confirmed on today's exam. No new lesion. No hydrocephalus or extra-axial collection. Vascular: Major vessels at the base of the brain show flow. Skull and upper cervical spine: Negative Sinuses/Orbits: Clear/normal Other: None IMPRESSION: 1. Stable appearance of the brain since 05/02/2023. Postoperative changes in the right cerebellum without evidence of residual or recurrent viable tumor. 2. Stable treated metastasis in the medial right parietal lobe. 3. Stable punctate focus of enhancement in the right caudate head. 4. Stable punctate focus of enhancement along the surface of the right parietal lobe. 5. No new lesion. Electronically Signed   By: Paulina Fusi M.D.   On: 09/15/2023 15:28

## 2023-09-17 NOTE — Progress Notes (Signed)
Telephone nursing appointment for review of most recent MRI results. I verified patient's identity x2 and began nursing interview.   Patient reports mild gait disturbance post chemo, lasting x5 days, began in October 20024. Patient denies any other related issues at this time.   Meaningful use complete.   Patient aware of their 10:30am-09/17/23 telephone appointment w/ Ashlyn Bruning PA-C. I left my extension [phone] in case patient needs anything. Patient verbalized understanding. This concludes the nursing interview.   Patient contact [phone]     Ruel Favors, LPN

## 2023-09-18 ENCOUNTER — Other Ambulatory Visit: Payer: Self-pay

## 2023-09-22 ENCOUNTER — Ambulatory Visit (HOSPITAL_COMMUNITY): Admission: RE | Admit: 2023-09-22 | Payer: MEDICAID | Source: Ambulatory Visit

## 2023-09-23 ENCOUNTER — Other Ambulatory Visit: Payer: Self-pay

## 2023-09-24 ENCOUNTER — Inpatient Hospital Stay: Payer: MEDICAID | Attending: Hematology

## 2023-09-24 ENCOUNTER — Inpatient Hospital Stay: Payer: MEDICAID

## 2023-09-24 ENCOUNTER — Inpatient Hospital Stay: Payer: MEDICAID | Admitting: Dietician

## 2023-09-24 ENCOUNTER — Inpatient Hospital Stay: Payer: MEDICAID | Admitting: Hematology

## 2023-09-24 ENCOUNTER — Telehealth: Payer: Self-pay | Admitting: *Deleted

## 2023-09-24 NOTE — Telephone Encounter (Signed)
 Patient cancelled CT scan, therefore appointment was cancelled for office visit today.  She is requesting refill on oxycodone , however, per Dr. Katragadda, medication will not be refilled at this time due to habitual no show and rescheduled appointments.  Explained to patient and verbalized understanding.

## 2023-09-28 ENCOUNTER — Ambulatory Visit (HOSPITAL_COMMUNITY)
Admission: RE | Admit: 2023-09-28 | Discharge: 2023-09-28 | Disposition: A | Discharge status: Home / Self Care | Payer: MEDICAID | Source: Ambulatory Visit | Attending: Hematology | Admitting: Hematology

## 2023-09-28 ENCOUNTER — Inpatient Hospital Stay: Payer: MEDICAID | Admitting: Hematology

## 2023-09-28 ENCOUNTER — Inpatient Hospital Stay: Payer: MEDICAID

## 2023-09-28 DIAGNOSIS — C50911 Malignant neoplasm of unspecified site of right female breast: Secondary | ICD-10-CM | POA: Diagnosis present

## 2023-09-28 DIAGNOSIS — Z1731 Human epidermal growth factor receptor 2 positive status: Secondary | ICD-10-CM | POA: Diagnosis present

## 2023-09-28 MED ORDER — IOHEXOL 350 MG/ML SOLN
75.0000 mL | Freq: Once | INTRAVENOUS | Status: AC | PRN
  Administered 2023-09-28: 75 mL via INTRAVENOUS

## 2023-09-28 NOTE — Progress Notes (Incomplete)
 [facility] [address] [place] [address]    Clinic Day:  09/28/2023  Referring physician: Veda Gerald, MD  Patient Care Team: Veda Gerald, MD as PCP - General (Internal Medicine) Paulett Boros, MD as Medical Oncologist (Medical Oncology) Gerhard Knuckles, RN as Oncology Nurse Navigator (Oncology)   ASSESSMENT & PLAN:   Assessment: 1. Her2+ metastatic breast cancer: - She reports feeling a knot in the right breast in July 2022.  She felt the same lump in September which has increased slightly in November.  In the last 3 months, it has increased in size from a quarter to size of an orange. - Mammogram/ultrasound on 10/01/2021: Large irregular mass involving the entire central right breast measuring 4.9 x 2.4 x 4 cm at 12:00.  At least 3 lymph nodes in the right axilla.  Overlying skin thickening and generalized erythema and marked tenderness. - Right breast mass and axillary lymph node biopsy on 10/08/2021: - Pathology: Poorly differentiated ductal carcinoma, grade 3, HER2 3+ positive, ER 5% strong staining intensity, PR negative, Ki-67 25%. - PET scan (11/30/2021): Hypermetabolic tumor involving right breast, metastatic mediastinal and hilar lymphadenopathy, hepatic metastatic disease, diffuse lytic bone lesions. - 4 cycles of THP from 12/03/2021 through 02/25/2022 - Cerebellar mass resection (11/12/2022): Metastatic carcinoma with tumor cells positive for CK7, GATA3, indicating breast primary.  ER and CK20 are negative.  HER2 is 3+. - Enhertu  started on 01/01/2023   2. Social/family history: - She is currently living with her father, youngest daughter and sister.  She has 2 daughters ages 63 and 37.  She has worked in Journalist, newspaper previously.  She quit smoking 3 months ago.  Prior to that she smoked a pack a day for 4 to 5 years and less than half pack a day since 2011.  She vapes occasionally at this time. - Paternal great grandmother had  lung cancer.  Paternal aunt had lung cancer at age 36.  Paternal grandmother had breast cancer in her 55s.  Paternal grandfather had colon cancer.  Maternal grandfather had colon cancer.    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  Also improvement in bone lesions.  Incidental appendiceal mucocele was seen. - She is asymptomatic.  She is tolerating Enhertu  reasonably well. - Reviewed labs today: Normal LFTs and creatinine.  CBC normal. - She may proceed with her treatment today and in 3 weeks.  RTC 6 weeks for follow-up with repeat CT CAP with contrast and tumor markers. - She will have 3T MRI of the brain done on 08/17/2023.  2.  Difficulty falling asleep: - Continue mirtazapine  15 mg at bedtime which is helping.  3.  Anxiety: - Continue Xanax  0.5 mg daily as needed.  This is well-controlled.  4.  Chest pain, back pain, right ankle pain: - She ran out of her pills few days ago as they were given for 3 weeks.  Urine drug screen today shows positivity for benzodiazepines and negative for opiates. - She will be given refill for Percocet 7.5 mg #84 today.   5.  High risk drug monitoring: - Reviewed 2D echo from 08/03/2023, LVEF 60 to 65% and stable.    No orders of the defined types were placed in this encounter.     Sara [name],acting as a Neurosurgeon for Paulett Boros, MD.,have documented all relevant documentation on the behalf  of Paulett Boros, MD,as directed by  Paulett Boros, MD while in the presence of Paulett Boros, MD.  ***    Sara [name]   2/10/20259:50 AM  CHIEF COMPLAINT:   Diagnosis: Her2+ metastatic right breast cancer    Cancer Staging  HER2-positive carcinoma of right breast [facility]) Staging form: Breast, AJCC 8th Edition - Clinical stage from 10/28/2021: Stage IV (cT4d, cN1, cM1, G3, ER+, PR-, HER2+) - Unsigned    Prior Therapy: 1.  DOCEtaxel  + Trastuzumab  + Pertuzumab  (THP) q21d x 4 cycles  2. SRS to brain metastases-- 10 fractions from 12/05/22 through 12/19/22, 1 fraction on 05/13/23  Current Therapy:  Enhertu     HISTORY OF PRESENT ILLNESS:   Oncology History  HER2-positive carcinoma of right breast (HCC)  10/28/2021 Initial Diagnosis   Breast cancer, right breast (HCC)   12/03/2021 - 02/25/2022 Chemotherapy   Patient is on Treatment Plan : BREAST DOCEtaxel  + Trastuzumab  + Pertuzumab  (THP) q21d x 8 cycles / Trastuzumab  + Pertuzumab  q21d x 4 cycles     01/01/2023 -  Chemotherapy   Patient is on Treatment Plan : BREAST METASTATIC Fam-Trastuzumab Deruxtecan-nxki  (Enhertu ) (5.4) q21d        INTERVAL HISTORY:   Sara [name] is a 48 y.o. female presenting to clinic today for follow up of Her2+ metastatic right breast cancer. She was last seen by me on 08/06/23.  Today, she states that she is doing well overall. [name]. Her energy level is at ***%.    PAST MEDICAL HISTORY:   Past Medical History: Past Medical History:  Diagnosis Date   Anxiety    Back pain    Cancer [facility])     Surgical History: Past Surgical History:  Procedure Laterality Date   CHOLECYSTECTOMY     ORTHOPEDIC SURGERY     PORTACATH PLACEMENT Left 11/18/2021   Procedure: INSERTION PORT-A-CATH;  Surgeon: Awilda Bogus, MD;  Location: [facility];  Service: General;  Laterality: Left;   SUBOCCIPITAL CRANIECTOMY CERVICAL LAMINECTOMY N/A 11/12/2022   Procedure: SUBOCCIPITAL CRANIECTOMY FOR RESECTION OF CEREBELLAR MASS;  Surgeon: Gearl Keens, MD;  Location: [facility] OR;  Service: Neurosurgery;  Laterality: N/A;    Social History: Social History   Socioeconomic History   Marital status: Legally Separated    Spouse name: Not on file   Number of children: Not on file   Years of education: Not on file   Highest education level: Not on file  Occupational History   Not on file  Tobacco Use   Smoking status: Former    Current packs/day:  0.00    Average packs/day: 1 pack/day for 5.0 years (5.0 ttl pk-yrs)    Types: Cigarettes    Start date: 11/08/2016    Quit date: 11/08/2021    Years since quitting: 1.8   Smokeless tobacco: Never  Vaping Use   Vaping status: Never Used  Substance and Sexual Activity   Alcohol use: No    Comment: occasionally   Drug use: No   Sexual activity: Yes    Birth control/protection: None  Other Topics Concern   Not on file  Social History Narrative   Not on file   Social Drivers of Health   Financial Resource Strain: High Risk (12/03/2021)   Overall Financial Resource Strain (CARDIA)    Difficulty of Paying Living Expenses: Very hard  Food Insecurity: No Food Insecurity (09/17/2023)   Hunger Vital Sign    Worried About Running Out of Food in the Last Year:  Never true    Ran Out of Food in the Last Year: Never true  Transportation Needs: No Transportation Needs (11/25/2022)   PRAPARE - Administrator, Civil Service (Medical): No    Lack of Transportation (Non-Medical): No  Recent Concern: Transportation Needs - Unmet Transportation Needs (11/11/2022)   PRAPARE - Transportation    Lack of Transportation (Medical): Yes    Lack of Transportation (Non-Medical): Yes  Physical Activity: Sufficiently Active (06/17/2021)   Exercise Vital Sign    Days of Exercise per Week: 5 days    Minutes of Exercise per Session: 30 min  Stress: Stress Concern Present (06/17/2021)   [employer] of Occupational Health - Occupational Stress Questionnaire    Feeling of Stress : Rather much  Social Connections: Socially Isolated (06/17/2021)   Social Connection and Isolation Panel [NHANES]    Frequency of Communication with Friends and Family: Twice a week    Frequency of Social Gatherings with Friends and Family: Never    Attends Religious Services: 1 to 4 times per year    Active Member of [employer] or Organizations: No    Attends Banker Meetings: Never    Marital Status: Divorced   Catering manager Violence: Not At Risk (09/17/2023)   Humiliation, Afraid, Rape, and Kick questionnaire    Fear of Current or Ex-Partner: No    Emotionally Abused: No    Physically Abused: No    Sexually Abused: No    Family History: Family History  Problem Relation Age of Onset   Heart disease Brother    Lung cancer Paternal Grandmother        lung   Cancer Paternal Grandfather    Asthma Daughter    Asthma Daughter     Current Medications:  Current Outpatient Medications:    ALPRAZolam  (XANAX ) 0.5 MG tablet, TAKE 1 TABLET BY MOUTH AT BEDTIME AS NEEDED FOR ANXIETY, Disp: 30 tablet, Rfl: 0   docusate sodium  (COLACE) 100 MG capsule, Take 1 capsule (100 mg total) by mouth 2 (two) times daily., Disp: , Rfl:    furosemide  (LASIX ) 20 MG tablet, Take 1 tablet (20 mg total) by mouth daily as needed., Disp: 30 tablet, Rfl: 0   lidocaine -prilocaine  (EMLA ) cream, Apply 1 Application topically as needed., Disp: 30 g, Rfl: 0   mirtazapine  (REMERON ) 15 MG tablet, Take 1 tablet (15 mg total) by mouth at bedtime., Disp: 30 tablet, Rfl: 2   ondansetron  (ZOFRAN ) 8 MG tablet, TAKE 1 TABLET BY MOUTH EVERY 8 HOURS AS NEEDED FOR NAUSEA FOR VOMITING, Disp: 90 tablet, Rfl: 5   oxyCODONE -acetaminophen  (PERCOCET) 7.5-325 MG tablet, Take 1 tablet by mouth every 6 (six) hours as needed for severe pain (pain score 7-10)., Disp: 84 tablet, Rfl: 0   pantoprazole  (PROTONIX ) 20 MG tablet, Take 1 tablet (20 mg total) by mouth daily., Disp: 30 tablet, Rfl: 5   prochlorperazine  (COMPAZINE ) 10 MG tablet, Take 1 tablet (10 mg total) by mouth every 6 (six) hours as needed for nausea or vomiting., Disp: 30 tablet, Rfl: 5   Allergies: Allergies  Allergen Reactions   Hydrocodone  Itching, Nausea And Vomiting, Rash and Other (See Comments)    Hallucinations when given vicodin post crani in 2024   Sulfa Antibiotics Nausea And Vomiting   Morphine  And Codeine Rash    REVIEW OF SYSTEMS:   Review of Systems   Constitutional:  Negative for chills, fatigue and fever.  HENT:   Negative for lump/mass, mouth sores, nosebleeds, sore throat  and trouble swallowing.   Eyes:  Negative for eye problems.  Respiratory:  Negative for cough and shortness of breath.   Cardiovascular:  Negative for chest pain, leg swelling and palpitations.  Gastrointestinal:  Negative for abdominal pain, constipation, diarrhea, nausea and vomiting.  Genitourinary:  Negative for bladder incontinence, difficulty urinating, dysuria, frequency, hematuria and nocturia.   Musculoskeletal:  Negative for arthralgias, back pain, flank pain, myalgias and neck pain.  Skin:  Negative for itching and rash.  Neurological:  Negative for dizziness, headaches and numbness.  Hematological:  Does not bruise/bleed easily.  Psychiatric/Behavioral:  Negative for depression, sleep disturbance and suicidal ideas. The patient is not nervous/anxious.   All other systems reviewed and are negative.    VITALS:   There were no vitals taken for this visit.  Wt Readings from Last 3 Encounters:  08/27/23 120 lb 9.5 oz (54.7 kg)  08/06/23 122 lb 6.4 oz (55.5 kg)  07/09/23 123 lb (55.8 kg)    There is no height or weight on file to calculate BMI.  Performance status (ECOG): 1 - Symptomatic but completely ambulatory  PHYSICAL EXAM:   Physical Exam Vitals and nursing note reviewed. Exam conducted with a chaperone present.  Constitutional:      Appearance: Normal appearance.  Cardiovascular:     Rate and Rhythm: Normal rate and regular rhythm.     Pulses: Normal pulses.     Heart sounds: Normal heart sounds.  Pulmonary:     Effort: Pulmonary effort is normal.     Breath sounds: Normal breath sounds.  Abdominal:     Palpations: Abdomen is soft. There is no hepatomegaly, splenomegaly or mass.     Tenderness: There is no abdominal tenderness.  Musculoskeletal:     Right lower leg: No edema.     Left lower leg: No edema.  Lymphadenopathy:      Cervical: No cervical adenopathy.     Right cervical: No superficial, deep or posterior cervical adenopathy.    Left cervical: No superficial, deep or posterior cervical adenopathy.     Upper Body:     Right upper body: No supraclavicular or axillary adenopathy.     Left upper body: No supraclavicular or axillary adenopathy.  Neurological:     General: No focal deficit present.     Mental Status: She is alert and oriented to person, place, and time.  Psychiatric:        Mood and Affect: Mood normal.        Behavior: Behavior normal.     LABS:      Latest Ref Rng & Units 08/27/2023   12:26 PM 08/06/2023    9:46 AM 07/09/2023    9:37 AM  CBC  WBC 4.0 - 10.5 K/uL 5.7  6.1  9.6   Hemoglobin 12.0 - 15.0 g/dL 40.9  81.1  91.4   Hematocrit 36.0 - 46.0 % 36.7  39.0  39.8   Platelets 150 - 400 K/uL 299  204  158       Latest Ref Rng & Units 08/27/2023   12:26 PM 08/06/2023    9:46 AM 07/09/2023    9:37 AM  CMP  Glucose 70 - 99 mg/dL 96  782  956   BUN 6 - 20 mg/dL 21  15  24    Creatinine 0.44 - 1.00 mg/dL 2.13  0.86  5.78   Sodium 135 - 145 mmol/L 138  136  136   Potassium 3.5 - 5.1 mmol/L 3.6  3.4  3.9   Chloride 98 - 111 mmol/L 104  103  104   CO2 22 - 32 mmol/L 26  23  25    Calcium 8.9 - 10.3 mg/dL 9.1  9.4  9.5   Total Protein 6.5 - 8.1 g/dL 7.0  7.2  7.4   Total Bilirubin 0.0 - 1.2 mg/dL <0.9  0.3  0.6   Alkaline Phos 38 - 126 U/L 64  75  68   AST 15 - 41 U/L 27  22  22    ALT 0 - 44 U/L 27  19  18       No results found for: "CEA1", "CEA" / No results found for: "CEA1", "CEA" No results found for: "PSA1" No results found for: "WJX914" No results found for: "CAN125"  No results found for: "TOTALPROTELP", "ALBUMINELP", "A1GS", "A2GS", "BETS", "BETA2SER", "GAMS", "MSPIKE", "SPEI" No results found for: "TIBC", "FERRITIN", "IRONPCTSAT" No results found for: "LDH"   STUDIES:   MR Brain W Wo Contrast Result Date: 09/15/2023 CLINICAL DATA:  Follow-up treated brain  metastases. EXAM: MRI HEAD WITHOUT AND WITH CONTRAST TECHNIQUE: Multiplanar, multiecho pulse sequences of the brain and surrounding structures were obtained without and with intravenous contrast. CONTRAST:  6mL GADAVIST  GADOBUTROL  1 MMOL/ML IV SOLN COMPARISON:  05/02/2023 FINDINGS: Brain: Postoperative changes in the right cerebellum appears stable without evidence of residual or recurrent viable tumor. No new posterior fossa lesion. No abnormal finding in the left hemisphere. Treated metastasis in the medial right parietal lobe is stable with the persistent enhancing focus measuring 5 x 6 mm, axial image 204. Persistent punctate focus of enhancement in the right caudate head, axial image 174, without change. No change in a punctate focus of enhancement along the surface of the right parietal lobe, axial image 201. Other small punctate focus question above that on the prior study is not confirmed on today's exam. No new lesion. No hydrocephalus or extra-axial collection. Vascular: Major vessels at the base of the brain show flow. Skull and upper cervical spine: Negative Sinuses/Orbits: Clear/normal Other: None IMPRESSION: 1. Stable appearance of the brain since 05/02/2023. Postoperative changes in the right cerebellum without evidence of residual or recurrent viable tumor. 2. Stable treated metastasis in the medial right parietal lobe. 3. Stable punctate focus of enhancement in the right caudate head. 4. Stable punctate focus of enhancement along the surface of the right parietal lobe. 5. No new lesion. Electronically Signed   By: Bettylou Brunner M.D.   On: 09/15/2023 15:28

## 2023-09-29 ENCOUNTER — Other Ambulatory Visit: Payer: Self-pay | Admitting: Radiation Therapy

## 2023-09-29 DIAGNOSIS — C7931 Secondary malignant neoplasm of brain: Secondary | ICD-10-CM

## 2023-09-30 ENCOUNTER — Other Ambulatory Visit: Payer: Self-pay

## 2023-09-30 NOTE — Progress Notes (Signed)
[facility] [address] [place] [address]    Clinic Day:  10/01/2023  Referring physician: Toma Deiters, MD  Patient Care Team: Toma Deiters, MD as PCP - General (Internal Medicine) Doreatha Massed, MD as Medical Oncologist (Medical Oncology) Therese Sarah, RN as Oncology Nurse Navigator (Oncology)   ASSESSMENT & PLAN:   Assessment: 1. Her2+ metastatic breast cancer: - She reports feeling a knot in the right breast in July 2022.  She felt the same lump in September which has increased slightly in November.  In the last 3 months, it has increased in size from a quarter to size of an orange. - Mammogram/ultrasound on 10/01/2021: Large irregular mass involving the entire central right breast measuring 4.9 x 2.4 x 4 cm at 12:00.  At least 3 lymph nodes in the right axilla.  Overlying skin thickening and generalized erythema and marked tenderness. - Right breast mass and axillary lymph node biopsy on 10/08/2021: - Pathology: Poorly differentiated ductal carcinoma, grade 3, HER2 3+ positive, ER 5% strong staining intensity, PR negative, Ki-67 25%. - PET scan (11/30/2021): Hypermetabolic tumor involving right breast, metastatic mediastinal and hilar lymphadenopathy, hepatic metastatic disease, diffuse lytic bone lesions. - 4 cycles of THP from 12/03/2021 through 02/25/2022 - Cerebellar mass resection (11/12/2022): Metastatic carcinoma with tumor cells positive for CK7, GATA3, indicating breast primary.  ER and CK20 are negative.  HER2 is 3+. - Enhertu started on 01/01/2023   2. Social/family history: - She is currently living with her father, youngest daughter and sister.  She has 2 daughters ages 60 and 43.  She has worked in Journalist, newspaper previously.  She quit smoking 3 months ago.  Prior to that she smoked a pack a day for 4 to 5 years and less than half pack a day since 2011.  She vapes occasionally at this time. - Paternal great grandmother had  lung cancer.  Paternal aunt had lung cancer at age 64.  Paternal grandmother had breast cancer in her 53s.  Paternal grandfather had colon cancer.  Maternal grandfather had colon cancer.    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 - She had CT CAP on 09/28/2023.  Results are pending at this time. - She reported feeling off balance for 4 days after last treatment.  She has some diarrhea but gets constipated if she takes Imodium.  She missed appointments last 2 weeks and reports personal problems.  Reviewed labs today: Normal LFTs and creatinine.  CBC grossly normal.  CA 15-3 and CA 27-29 were normal. - She will come back on Monday to receive her treatment.  RTC 3 weeks for follow-up.  2.  Difficulty falling asleep: - Continue Remeron 15 mg at bedtime which is helping.  3.  Anxiety: - Continue Xanax 0.5 mg daily as needed.  Anxiety symptoms well-controlled.  4.  Chest pain, back pain, right ankle pain: - We reviewed the urine drug screen from today.  It was positive for cocaine.  It was also positive for [facility] and she admits taking Gummies. -'s urine is positive for cocaine, I would not give hydrocodone refill today.   5.  High risk drug monitoring: - 2D echo on 08/03/2023 with LVEF 60 to 65%.  Will repeat echo prior to next visit.    Orders Placed This Encounter  Procedures   ECHOCARDIOGRAM COMPLETE    Standing Status:  Future    Expected Date:   10/29/2023    Expiration Date:   09/30/2024    Where should this test be performed:   [facility]    Perflutren DEFINITY (image enhancing agent) should be administered unless hypersensitivity or allergy exist:   Administer Perflutren    Reason for exam-Echo:   Chemo  Z09      I,Helena R Teague,acting as a scribe for Doreatha Massed, MD.,have documented all relevant documentation on the behalf of Doreatha Massed, MD,as directed by  Doreatha Massed, MD while in the presence of Doreatha Massed, MD.  I, Doreatha Massed MD, have reviewed the above documentation for accuracy and completeness, and I agree with the above.     Doreatha Massed, MD   2/13/202512:51 PM  CHIEF COMPLAINT:   Diagnosis: Her2+ metastatic right breast cancer    Cancer Staging  HER2-positive carcinoma of right breast [facility]) Staging form: Breast, AJCC 8th Edition - Clinical stage from 10/28/2021: Stage IV (cT4d, cN1, cM1, G3, ER+, PR-, HER2+) - Unsigned    Prior Therapy: 1. DOCEtaxel + Trastuzumab + Pertuzumab (THP) q21d x 4 cycles  2. SRS to brain metastases-- 10 fractions from 12/05/22 through 12/19/22, 1 fraction on 05/13/23  Current Therapy:  Enhertu    HISTORY OF PRESENT ILLNESS:   Oncology History  HER2-positive carcinoma of right breast (HCC)  10/28/2021 Initial Diagnosis   Breast cancer, right breast (HCC)   12/03/2021 - 02/25/2022 Chemotherapy   Patient is on Treatment Plan : BREAST DOCEtaxel + Trastuzumab + Pertuzumab (THP) q21d x 8 cycles / Trastuzumab + Pertuzumab q21d x 4 cycles     01/01/2023 -  Chemotherapy   Patient is on Treatment Plan : BREAST METASTATIC Fam-Trastuzumab Deruxtecan-nxki (Enhertu) (5.4) q21d        INTERVAL HISTORY:   Sara [name] is a 48 y.o. female presenting to clinic today for follow up of Her2+ metastatic right breast cancer. She was last seen by me on 08/06/23.  Since her last visit, she underwent CT C/A/P on 09/28/23.  Sara [name] had a brain MRI on 09/14/23 that found: Stable appearance of the brain since 05/02/2023. Postoperative changes in the right cerebellum without evidence of residual or recurrent viable tumor. Stable treated metastasis in the medial right parietal lobe. Stable punctate focus of enhancement in the right caudate head. Stable punctate focus of enhancement along the surface of the right parietal lobe. No new lesion.  Today, she states that she is doing well overall. Her appetite level  is at 80%. Her energy level is at 70%. She is accompanied by a family member.   She notes a painful cyst on the 3rd digit of the left hand. Movement worsens pain and she does not have full dexterity of the finger. She has a family history of cysts, as her father has an inoperable cyst on the right hand and her grandmother had a cyst on her right breast.   Omeka has finished her Perocet pills for chest, back, and right ankle pain this morning. She takes THC gummies to help with pain. She would like a refill of her pain medication. She is taking Remeron and Xanax as prescribed.   She reports after her last treatment, she was off-balance and dizzy for 4-5 days. Gabrella denies any falls.  She occasionally has alternating diarrhea and constipation, treated with Imodium.   PAST MEDICAL HISTORY:   Past Medical History: Past Medical History:  Diagnosis Date   Anxiety    Back pain  Cancer [facility])     Surgical History: Past Surgical History:  Procedure Laterality Date   CHOLECYSTECTOMY     ORTHOPEDIC SURGERY     PORTACATH PLACEMENT Left 11/18/2021   Procedure: INSERTION PORT-A-CATH;  Surgeon: Lucretia Roers, MD;  Location: [facility];  Service: General;  Laterality: Left;   SUBOCCIPITAL CRANIECTOMY CERVICAL LAMINECTOMY N/A 11/12/2022   Procedure: SUBOCCIPITAL CRANIECTOMY FOR RESECTION OF CEREBELLAR MASS;  Surgeon: Donalee Citrin, MD;  Location: [facility] OR;  Service: Neurosurgery;  Laterality: N/A;    Social History: Social History   Socioeconomic History   Marital status: Legally Separated    Spouse name: Not on file   Number of children: Not on file   Years of education: Not on file   Highest education level: Not on file  Occupational History   Not on file  Tobacco Use   Smoking status: Former    Current packs/day: 0.00    Average packs/day: 1 pack/day for 5.0 years (5.0 ttl pk-yrs)    Types: Cigarettes    Start date: 11/08/2016    Quit date: 11/08/2021    Years since quitting: 1.8    Smokeless tobacco: Never  Vaping Use   Vaping status: Never Used  Substance and Sexual Activity   Alcohol use: No    Comment: occasionally   Drug use: No   Sexual activity: Yes    Birth control/protection: None  Other Topics Concern   Not on file  Social History Narrative   Not on file   Social Drivers of Health   Financial Resource Strain: High Risk (12/03/2021)   Overall Financial Resource Strain (CARDIA)    Difficulty of Paying Living Expenses: Very hard  Food Insecurity: No Food Insecurity (09/17/2023)   Hunger Vital Sign    Worried About Running Out of Food in the Last Year: Never true    Ran Out of Food in the Last Year: Never true  Transportation Needs: No Transportation Needs (11/25/2022)   PRAPARE - Administrator, Civil Service (Medical): No    Lack of Transportation (Non-Medical): No  Recent Concern: Transportation Needs - Unmet Transportation Needs (11/11/2022)   PRAPARE - Transportation    Lack of Transportation (Medical): Yes    Lack of Transportation (Non-Medical): Yes  Physical Activity: Sufficiently Active (06/17/2021)   Exercise Vital Sign    Days of Exercise per Week: 5 days    Minutes of Exercise per Session: 30 min  Stress: Stress Concern Present (06/17/2021)   [employer] of Occupational Health - Occupational Stress Questionnaire    Feeling of Stress : Rather much  Social Connections: Socially Isolated (06/17/2021)   Social Connection and Isolation Panel [NHANES]    Frequency of Communication with Friends and Family: Twice a week    Frequency of Social Gatherings with Friends and Family: Never    Attends Religious Services: 1 to 4 times per year    Active Member of [employer] or Organizations: No    Attends Banker Meetings: Never    Marital Status: Divorced  Catering manager Violence: Not At Risk (09/17/2023)   Humiliation, Afraid, Rape, and Kick questionnaire    Fear of Current or Ex-Partner: No    Emotionally Abused: No     Physically Abused: No    Sexually Abused: No    Family History: Family History  Problem Relation Age of Onset   Heart disease Brother    Lung cancer Paternal Grandmother        lung  Cancer Paternal Grandfather    Asthma Daughter    Asthma Daughter     Current Medications:  Current Outpatient Medications:    ALPRAZolam (XANAX) 0.5 MG tablet, TAKE 1 TABLET BY MOUTH AT BEDTIME AS NEEDED FOR ANXIETY, Disp: 30 tablet, Rfl: 0   docusate sodium (COLACE) 100 MG capsule, Take 1 capsule (100 mg total) by mouth 2 (two) times daily., Disp: , Rfl:    furosemide (LASIX) 20 MG tablet, Take 1 tablet (20 mg total) by mouth daily as needed., Disp: 30 tablet, Rfl: 0   lidocaine-prilocaine (EMLA) cream, Apply 1 Application topically as needed., Disp: 30 g, Rfl: 0   mirtazapine (REMERON) 15 MG tablet, Take 1 tablet (15 mg total) by mouth at bedtime., Disp: 30 tablet, Rfl: 2   ondansetron (ZOFRAN) 8 MG tablet, TAKE 1 TABLET BY MOUTH EVERY 8 HOURS AS NEEDED FOR NAUSEA FOR VOMITING, Disp: 90 tablet, Rfl: 5   oxyCODONE-acetaminophen (PERCOCET) 7.5-325 MG tablet, Take 1 tablet by mouth every 6 (six) hours as needed for severe pain (pain score 7-10)., Disp: 84 tablet, Rfl: 0   pantoprazole (PROTONIX) 20 MG tablet, Take 1 tablet (20 mg total) by mouth daily., Disp: 30 tablet, Rfl: 5   prochlorperazine (COMPAZINE) 10 MG tablet, Take 1 tablet (10 mg total) by mouth every 6 (six) hours as needed for nausea or vomiting., Disp: 30 tablet, Rfl: 5   Allergies: Allergies  Allergen Reactions   Hydrocodone Itching, Nausea And Vomiting, Rash and Other (See Comments)    Hallucinations when given vicodin post crani in 2024   Sulfa Antibiotics Nausea And Vomiting   Morphine And Codeine Rash    REVIEW OF SYSTEMS:   Review of Systems  Constitutional:  Negative for chills, fatigue and fever.  HENT:   Negative for lump/mass, mouth sores, nosebleeds, sore throat and trouble swallowing.   Eyes:  Negative for eye  problems.  Respiratory:  Negative for cough and shortness of breath.   Cardiovascular:  Negative for chest pain, leg swelling and palpitations.  Gastrointestinal:  Positive for constipation and nausea. Negative for abdominal pain, diarrhea and vomiting.  Genitourinary:  Negative for bladder incontinence, difficulty urinating, dysuria, frequency, hematuria and nocturia.   Musculoskeletal:  Positive for back pain (lower, 8/10 severity). Negative for arthralgias, flank pain, myalgias and neck pain.  Skin:  Negative for itching and rash.  Neurological:  Positive for dizziness and headaches. Negative for numbness.  Hematological:  Does not bruise/bleed easily.  Psychiatric/Behavioral:  Positive for depression. Negative for sleep disturbance and suicidal ideas. The patient is nervous/anxious.   All other systems reviewed and are negative.    VITALS:   There were no vitals taken for this visit.  Wt Readings from Last 3 Encounters:  10/01/23 120 lb 9.5 oz (54.7 kg)  08/27/23 120 lb 9.5 oz (54.7 kg)  08/06/23 122 lb 6.4 oz (55.5 kg)    There is no height or weight on file to calculate BMI.  Performance status (ECOG): 1 - Symptomatic but completely ambulatory  PHYSICAL EXAM:   Physical Exam Vitals and nursing note reviewed. Exam conducted with a chaperone present.  Constitutional:      Appearance: Normal appearance.  Cardiovascular:     Rate and Rhythm: Normal rate and regular rhythm.     Pulses: Normal pulses.     Heart sounds: Normal heart sounds.  Pulmonary:     Effort: Pulmonary effort is normal.     Breath sounds: Normal breath sounds.  Abdominal:  Palpations: Abdomen is soft. There is no hepatomegaly, splenomegaly or mass.     Tenderness: There is no abdominal tenderness.  Musculoskeletal:     Right lower leg: No edema.     Left lower leg: No edema.  Lymphadenopathy:     Cervical: No cervical adenopathy.     Right cervical: No superficial, deep or posterior cervical  adenopathy.    Left cervical: No superficial, deep or posterior cervical adenopathy.     Upper Body:     Right upper body: No supraclavicular or axillary adenopathy.     Left upper body: No supraclavicular or axillary adenopathy.  Neurological:     General: No focal deficit present.     Mental Status: She is alert and oriented to person, place, and time.  Psychiatric:        Mood and Affect: Mood normal.        Behavior: Behavior normal.     LABS:      Latest Ref Rng & Units 10/01/2023   11:01 AM 08/27/2023   12:26 PM 08/06/2023    9:46 AM  CBC  WBC 4.0 - 10.5 K/uL 8.9  5.7  6.1   Hemoglobin 12.0 - 15.0 g/dL 60.4  54.0  98.1   Hematocrit 36.0 - 46.0 % 40.9  36.7  39.0   Platelets 150 - 400 K/uL 220  299  204       Latest Ref Rng & Units 10/01/2023   11:01 AM 08/27/2023   12:26 PM 08/06/2023    9:46 AM  CMP  Glucose 70 - 99 mg/dL 191  96  478   BUN 6 - 20 mg/dL 30  21  15    Creatinine 0.44 - 1.00 mg/dL 2.95  6.21  3.08   Sodium 135 - 145 mmol/L 138  138  136   Potassium 3.5 - 5.1 mmol/L 4.0  3.6  3.4   Chloride 98 - 111 mmol/L 101  104  103   CO2 22 - 32 mmol/L 26  26  23    Calcium 8.9 - 10.3 mg/dL 9.4  9.1  9.4   Total Protein 6.5 - 8.1 g/dL 7.4  7.0  7.2   Total Bilirubin 0.0 - 1.2 mg/dL 0.4  <6.5  0.3   Alkaline Phos 38 - 126 U/L 59  64  75   AST 15 - 41 U/L 28  27  22    ALT 0 - 44 U/L 21  27  19       No results found for: "CEA1", "CEA" / No results found for: "CEA1", "CEA" No results found for: "PSA1" No results found for: "HQI696" No results found for: "CAN125"  No results found for: "TOTALPROTELP", "ALBUMINELP", "A1GS", "A2GS", "BETS", "BETA2SER", "GAMS", "MSPIKE", "SPEI" No results found for: "TIBC", "FERRITIN", "IRONPCTSAT" No results found for: "LDH"   STUDIES:   MR Brain W Wo Contrast Result Date: 09/15/2023 CLINICAL DATA:  Follow-up treated brain metastases. EXAM: MRI HEAD WITHOUT AND WITH CONTRAST TECHNIQUE: Multiplanar, multiecho pulse sequences of  the brain and surrounding structures were obtained without and with intravenous contrast. CONTRAST:  6mL GADAVIST GADOBUTROL 1 MMOL/ML IV SOLN COMPARISON:  05/02/2023 FINDINGS: Brain: Postoperative changes in the right cerebellum appears stable without evidence of residual or recurrent viable tumor. No new posterior fossa lesion. No abnormal finding in the left hemisphere. Treated metastasis in the medial right parietal lobe is stable with the persistent enhancing focus measuring 5 x 6 mm, axial image 204. Persistent punctate focus of enhancement  in the right caudate head, axial image 174, without change. No change in a punctate focus of enhancement along the surface of the right parietal lobe, axial image 201. Other small punctate focus question above that on the prior study is not confirmed on today's exam. No new lesion. No hydrocephalus or extra-axial collection. Vascular: Major vessels at the base of the brain show flow. Skull and upper cervical spine: Negative Sinuses/Orbits: Clear/normal Other: None IMPRESSION: 1. Stable appearance of the brain since 05/02/2023. Postoperative changes in the right cerebellum without evidence of residual or recurrent viable tumor. 2. Stable treated metastasis in the medial right parietal lobe. 3. Stable punctate focus of enhancement in the right caudate head. 4. Stable punctate focus of enhancement along the surface of the right parietal lobe. 5. No new lesion. Electronically Signed   By: Paulina Fusi M.D.   On: 09/15/2023 15:28

## 2023-10-01 ENCOUNTER — Inpatient Hospital Stay (HOSPITAL_BASED_OUTPATIENT_CLINIC_OR_DEPARTMENT_OTHER): Payer: MEDICAID | Admitting: Hematology

## 2023-10-01 ENCOUNTER — Inpatient Hospital Stay: Payer: MEDICAID | Attending: Hematology

## 2023-10-01 VITALS — BP 132/92 | HR 117 | Temp 99.3°F | Resp 20 | Wt 120.6 lb

## 2023-10-01 DIAGNOSIS — Z79899 Other long term (current) drug therapy: Secondary | ICD-10-CM

## 2023-10-01 DIAGNOSIS — Z95828 Presence of other vascular implants and grafts: Secondary | ICD-10-CM

## 2023-10-01 DIAGNOSIS — Z5112 Encounter for antineoplastic immunotherapy: Secondary | ICD-10-CM | POA: Diagnosis present

## 2023-10-01 DIAGNOSIS — C7951 Secondary malignant neoplasm of bone: Secondary | ICD-10-CM | POA: Insufficient documentation

## 2023-10-01 DIAGNOSIS — Z1731 Human epidermal growth factor receptor 2 positive status: Secondary | ICD-10-CM | POA: Diagnosis not present

## 2023-10-01 DIAGNOSIS — Z87891 Personal history of nicotine dependence: Secondary | ICD-10-CM | POA: Diagnosis not present

## 2023-10-01 DIAGNOSIS — C50911 Malignant neoplasm of unspecified site of right female breast: Secondary | ICD-10-CM | POA: Diagnosis not present

## 2023-10-01 DIAGNOSIS — C7931 Secondary malignant neoplasm of brain: Secondary | ICD-10-CM | POA: Diagnosis not present

## 2023-10-01 DIAGNOSIS — C787 Secondary malignant neoplasm of liver and intrahepatic bile duct: Secondary | ICD-10-CM | POA: Diagnosis not present

## 2023-10-01 DIAGNOSIS — G893 Neoplasm related pain (acute) (chronic): Secondary | ICD-10-CM

## 2023-10-01 LAB — COMPREHENSIVE METABOLIC PANEL
ALT: 21 U/L (ref 0–44)
AST: 28 U/L (ref 15–41)
Albumin: 3.9 g/dL (ref 3.5–5.0)
Alkaline Phosphatase: 59 U/L (ref 38–126)
Anion gap: 11 (ref 5–15)
BUN: 30 mg/dL — ABNORMAL HIGH (ref 6–20)
CO2: 26 mmol/L (ref 22–32)
Calcium: 9.4 mg/dL (ref 8.9–10.3)
Chloride: 101 mmol/L (ref 98–111)
Creatinine, Ser: 0.71 mg/dL (ref 0.44–1.00)
GFR, Estimated: >60 mL/min (ref >60)
Glucose, Bld: 101 mg/dL — ABNORMAL HIGH (ref 70–99)
Potassium: 4 mmol/L (ref 3.5–5.1)
Sodium: 138 mmol/L (ref 135–145)
Total Bilirubin: 0.4 mg/dL (ref 0.0–1.2)
Total Protein: 7.4 g/dL (ref 6.5–8.1)

## 2023-10-01 LAB — CBC WITH DIFFERENTIAL/PLATELET
Abs Immature Granulocytes: 0.03 10*3/uL (ref 0.00–0.07)
Basophils Absolute: 0.1 10*3/uL (ref 0.0–0.1)
Basophils Relative: 1 %
Eosinophils Absolute: 0.1 10*3/uL (ref 0.0–0.5)
Eosinophils Relative: 1 %
HCT: 40.9 % (ref 36.0–46.0)
Hemoglobin: 13.3 g/dL (ref 12.0–15.0)
Immature Granulocytes: 0 %
Lymphocytes Relative: 18 %
Lymphs Abs: 1.6 10*3/uL (ref 0.7–4.0)
MCH: 32.9 pg (ref 26.0–34.0)
MCHC: 32.5 g/dL (ref 30.0–36.0)
MCV: 101.2 fL — ABNORMAL HIGH (ref 80.0–100.0)
Monocytes Absolute: 0.8 10*3/uL (ref 0.1–1.0)
Monocytes Relative: 10 %
Neutro Abs: 6.3 10*3/uL (ref 1.7–7.7)
Neutrophils Relative %: 70 %
Platelets: 220 10*3/uL (ref 150–400)
RBC: 4.04 MIL/uL (ref 3.87–5.11)
RDW: 13 % (ref 11.5–15.5)
WBC: 8.9 10*3/uL (ref 4.0–10.5)
nRBC: 0 % (ref 0.0–0.2)

## 2023-10-01 LAB — RAPID URINE DRUG SCREEN, HOSP PERFORMED
Amphetamines: NOT DETECTED
Barbiturates: NOT DETECTED
Benzodiazepines: POSITIVE — AB
Cocaine: POSITIVE — AB
Opiates: NOT DETECTED
Tetrahydrocannabinol: POSITIVE — AB

## 2023-10-01 LAB — MAGNESIUM: Magnesium: 1.9 mg/dL (ref 1.7–2.4)

## 2023-10-01 MED ORDER — SODIUM CHLORIDE 0.9% FLUSH
10.0000 mL | INTRAVENOUS | Status: DC | PRN
  Administered 2023-10-01: 10 mL via INTRAVENOUS

## 2023-10-01 MED ORDER — HEPARIN SOD (PORK) LOCK FLUSH 100 UNIT/ML IV SOLN
500.0000 [IU] | Freq: Once | INTRAVENOUS | Status: AC
Start: 2023-10-01 — End: 2023-10-01
  Administered 2023-10-01: 500 [IU] via INTRAVENOUS

## 2023-10-01 NOTE — Progress Notes (Signed)
Patients port flushed without difficulty.  Good blood return noted with no bruising or swelling noted at site.  Band aid applied.  VSS with discharge and left in satisfactory condition with no s/s of distress noted.

## 2023-10-01 NOTE — Patient Instructions (Signed)
[facility] Discharge Instructions   You were seen and examined today by Dr. Ellin Saba.  He reviewed the results of your lab work which are normal/stable. Your urine drug screen is positive for cocaine. Dr. Kirtland Bouchard will not prescribe your pain medication today.   We will proceed with your treatment on Monday.    Return as scheduled.    Thank you for choosing  [facility] Center at [facility] to provide your oncology and hematology care.  To afford each patient quality time with our provider, please arrive at least 15 minutes before your scheduled appointment time.   If you have a lab appointment with the Cancer Center please come in thru the Main Entrance and check in at the main information desk.  You need to re-schedule your appointment should you arrive 10 or more minutes late.  We strive to give you quality time with our providers, and arriving late affects you and other patients whose appointments are after yours.  Also, if you no show three or more times for appointments you may be dismissed from the clinic at the providers discretion.     Again, thank you for choosing [facility].  Our hope is that these requests will decrease the amount of time that you wait before being seen by our physicians.       _____________________________________________________________  Should you have questions after your visit to [facility], please contact our office at [phone] and follow the prompts.  Our office hours are 8:00 a.m. and 4:30 p.m. Monday - Friday.  Please note that voicemails left after 4:00 p.m. may not be returned until the following business day.  We are closed weekends and major holidays.  You do have access to a nurse 24-7, just call the main number to the clinic [phone] and do not press any options, hold on the line and a nurse will answer the phone.    For prescription refill requests, have your  pharmacy contact our office and allow 72 hours.    Due to Covid, you will need to wear a mask upon entering the hospital. If you do not have a mask, a mask will be given to you at the Main Entrance upon arrival. For doctor visits, patients may have 1 support person age 7 or older with them. For treatment visits, patients can not have anyone with them due to social distancing guidelines and our immunocompromised population.

## 2023-10-05 ENCOUNTER — Other Ambulatory Visit: Payer: MEDICAID

## 2023-10-05 ENCOUNTER — Other Ambulatory Visit: Payer: Self-pay

## 2023-10-05 ENCOUNTER — Ambulatory Visit: Payer: MEDICAID | Admitting: Hematology

## 2023-10-05 ENCOUNTER — Inpatient Hospital Stay: Payer: MEDICAID

## 2023-10-05 VITALS — BP 120/82 | HR 93 | Temp 98.6°F | Resp 18 | Wt 123.0 lb

## 2023-10-05 DIAGNOSIS — G893 Neoplasm related pain (acute) (chronic): Secondary | ICD-10-CM

## 2023-10-05 DIAGNOSIS — Z5112 Encounter for antineoplastic immunotherapy: Secondary | ICD-10-CM | POA: Diagnosis not present

## 2023-10-05 DIAGNOSIS — F419 Anxiety disorder, unspecified: Secondary | ICD-10-CM

## 2023-10-05 DIAGNOSIS — Z1731 Human epidermal growth factor receptor 2 positive status: Secondary | ICD-10-CM

## 2023-10-05 DIAGNOSIS — C50911 Malignant neoplasm of unspecified site of right female breast: Secondary | ICD-10-CM

## 2023-10-05 LAB — RAPID URINE DRUG SCREEN, HOSP PERFORMED
Amphetamines: NOT DETECTED
Barbiturates: NOT DETECTED
Benzodiazepines: POSITIVE — AB
Cocaine: NOT DETECTED
Opiates: NOT DETECTED
Tetrahydrocannabinol: POSITIVE — AB

## 2023-10-05 MED ORDER — SODIUM CHLORIDE 0.9 % IV SOLN
150.0000 mg | Freq: Once | INTRAVENOUS | Status: AC
  Administered 2023-10-05: 150 mg via INTRAVENOUS
  Filled 2023-10-05: qty 150

## 2023-10-05 MED ORDER — PALONOSETRON HCL INJECTION 0.25 MG/5ML
0.2500 mg | Freq: Once | INTRAVENOUS | Status: AC
  Administered 2023-10-05: 0.25 mg via INTRAVENOUS
  Filled 2023-10-05: qty 5

## 2023-10-05 MED ORDER — DEXTROSE 5 % IV SOLN
Freq: Once | INTRAVENOUS | Status: AC
Start: 2023-10-05 — End: 2023-10-05

## 2023-10-05 MED ORDER — FAM-TRASTUZUMAB DERUXTECAN-NXKI CHEMO 100 MG IV SOLR
5.4000 mg/kg | Freq: Once | INTRAVENOUS | Status: AC
  Administered 2023-10-05: 300 mg via INTRAVENOUS
  Filled 2023-10-05: qty 15

## 2023-10-05 MED ORDER — CETIRIZINE HCL 10 MG/ML IV SOLN
10.0000 mg | Freq: Once | INTRAVENOUS | Status: AC
  Administered 2023-10-05: 10 mg via INTRAVENOUS
  Filled 2023-10-05: qty 1

## 2023-10-05 MED ORDER — SODIUM CHLORIDE 0.9% FLUSH
10.0000 mL | INTRAVENOUS | Status: DC | PRN
  Administered 2023-10-05: 10 mL

## 2023-10-05 MED ORDER — DEXAMETHASONE SODIUM PHOSPHATE 10 MG/ML IJ SOLN
10.0000 mg | Freq: Once | INTRAMUSCULAR | Status: AC
  Administered 2023-10-05: 10 mg via INTRAVENOUS
  Filled 2023-10-05: qty 1

## 2023-10-05 MED ORDER — HEPARIN SOD (PORK) LOCK FLUSH 100 UNIT/ML IV SOLN
500.0000 [IU] | Freq: Once | INTRAVENOUS | Status: AC | PRN
  Administered 2023-10-05: 500 [IU]

## 2023-10-05 MED ORDER — ACETAMINOPHEN 325 MG PO TABS
650.0000 mg | ORAL_TABLET | Freq: Once | ORAL | Status: AC
  Administered 2023-10-05: 650 mg via ORAL
  Filled 2023-10-05: qty 2

## 2023-10-05 NOTE — Progress Notes (Signed)
 Patient presents today for Enhertu infusion. Patient is in satisfactory condition with no new complaints voiced.  All other vital signs are stable. Patient's HR noted to be 109 today. Labs reviewed by Dr. Ellin Saba (10/01/23) during the office visit and all labs are within treatment parameters.  We will proceed with treatment per MD orders.   Treatment given today per MD orders. Tolerated infusion without adverse affects. Vital signs stable. No complaints at this time. Discharged from clinic ambulatory in stable condition. Alert and oriented x 3. F/U with [facility] as scheduled.

## 2023-10-05 NOTE — Patient Instructions (Signed)
 [facility]  - [facility]  Discharge Instructions: Thank you for choosing [facility] to provide your oncology and hematology care.  If you have a lab appointment with the Cancer Center - please note that after April 8th, 2024, all labs will be drawn in the cancer center.  You do not have to check in or register with the main entrance as you have in the past but will complete your check-in in the cancer center.  Wear comfortable clothing and clothing appropriate for easy access to any Portacath or PICC line.   We strive to give you quality time with your provider. You may need to reschedule your appointment if you arrive late (15 or more minutes).  Arriving late affects you and other patients whose appointments are after yours.  Also, if you miss three or more appointments without notifying the office, you may be dismissed from the clinic at the provider's discretion.      For prescription refill requests, have your pharmacy contact our office and allow 72 hours for refills to be completed.    Today you received the following chemotherapy and/or immunotherapy agents Enhertu   To help prevent nausea and vomiting after your treatment, we encourage you to take your nausea medication as directed.  Fam-Trastuzumab Deruxtecan Injection What is this medication? FAM-TRASTUZUMAB DERUXTECAN (fam-tras TOOZ eu mab DER ux TEE kan) treats some types of cancer. It works by blocking a protein that causes cancer cells to grow and multiply. This helps to slow or stop the spread of cancer cells. This medicine may be used for other purposes; ask your health care provider or pharmacist if you have questions. COMMON BRAND NAME(S): ENHERTU What should I tell my care team before I take this medication? They need to know if you have any of these conditions: Heart disease Heart failure Infection, especially a viral infection, such as chickenpox, cold sores, or  herpes Liver disease Lung or breathing disease, such as asthma or COPD An unusual or allergic reaction to fam-trastuzumab deruxtecan, other medications, foods, dyes, or preservatives Pregnant or trying to get pregnant Breast-feeding How should I use this medication? This medication is injected into a vein. It is given by your care team in a hospital or clinic setting. A special MedGuide will be given to you before each treatment. Be sure to read this information carefully each time. Talk to your care team about the use of this medication in children. Special care may be needed. Overdosage: If you think you have taken too much of this medicine contact a poison control center or emergency room at once. NOTE: This medicine is only for you. Do not share this medicine with others. What if I miss a dose? It is important not to miss your dose. Call your care team if you are unable to keep an appointment. What may interact with this medication? Interactions are not expected. This list may not describe all possible interactions. Give your health care provider a list of all the medicines, herbs, non-prescription drugs, or dietary supplements you use. Also tell them if you smoke, drink alcohol, or use illegal drugs. Some items may interact with your medicine. What should I watch for while using this medication? Visit your care team for regular checks on your progress. Tell your care team if your symptoms do not start to get better or if they get worse. This medication may increase your risk of getting an infection. Call  your care team for advice if you get a fever, chills, sore throat, or other symptoms of a cold or flu. Do not treat yourself. Try to avoid being around people who are sick. Avoid taking medications that contain aspirin, acetaminophen, ibuprofen, naproxen, or ketoprofen unless instructed by your care team. These medications may hide a fever. Be careful brushing or flossing your teeth or  using a toothpick because you may get an infection or bleed more easily. If you have any dental work done, tell your dentist you are receiving this medication. This medication may cause dry eyes and blurred vision. If you wear contact lenses, you may feel some discomfort. Lubricating eye drops may help. See your care team if the problem does not go away or is severe. Talk to your care team if you may be pregnant. Serious birth defects can occur if you take this medication during pregnancy and for 7 months after the last dose. If your partner can get pregnant, use a condom during sex while taking this medication and for 4 months after the last dose. Do not breastfeed while taking this medication and for 7 months after the last dose. This medication may cause infertility. Talk to your care team if you are concerned about your fertility. What side effects may I notice from receiving this medication? Side effects that you should report to your care team as soon as possible: Allergic reactions--skin rash, itching, hives, swelling of the face, lips, tongue, or throat Dry cough, shortness of breath or trouble breathing Infection--fever, chills, cough, sore throat, wounds that don't heal, pain or trouble when passing urine, general feeling of discomfort or being unwell Heart failure--shortness of breath, swelling of the ankles, feet, or hands, sudden weight gain, unusual weakness or fatigue Unusual bruising or bleeding Side effects that usually do not require medical attention (report these to your care team if they continue or are bothersome): Constipation Diarrhea Hair loss Muscle pain Nausea Vomiting This list may not describe all possible side effects. Call your doctor for medical advice about side effects. You may report side effects to FDA at 1-800-FDA-1088. Where should I keep my medication? This medication is given in a hospital or clinic. It will not be stored at home. NOTE: This sheet is a  summary. It may not cover all possible information. If you have questions about this medicine, talk to your doctor, pharmacist, or health care provider.  2024 Elsevier/Gold Standard (2023-04-03 00:00:00)  BELOW ARE SYMPTOMS THAT SHOULD BE REPORTED IMMEDIATELY: *FEVER GREATER THAN 100.4 F (38 C) OR HIGHER *CHILLS OR SWEATING *NAUSEA AND VOMITING THAT IS NOT CONTROLLED WITH YOUR NAUSEA MEDICATION *UNUSUAL SHORTNESS OF BREATH *UNUSUAL BRUISING OR BLEEDING *URINARY PROBLEMS (pain or burning when urinating, or frequent urination) *BOWEL PROBLEMS (unusual diarrhea, constipation, pain near the anus) TENDERNESS IN MOUTH AND THROAT WITH OR WITHOUT PRESENCE OF ULCERS (sore throat, sores in mouth, or a toothache) UNUSUAL RASH, SWELLING OR PAIN  UNUSUAL VAGINAL DISCHARGE OR ITCHING   Items with * indicate a potential emergency and should be followed up as soon as possible or go to the Emergency Department if any problems should occur.  Please show the CHEMOTHERAPY ALERT CARD or IMMUNOTHERAPY ALERT CARD at check-in to the Emergency Department and triage nurse.  Should you have questions after your visit or need to cancel or reschedule your appointment, please contact [facility] [place] - A DEPT OF [facility] Brideg[facility] [phone]  and follow the prompts.  Office hours are  8:00 a.m. to 4:30 p.m. Monday - Friday. Please note that voicemails left after 4:00 p.m. may not be returned until the following business day.  We are closed weekends and major holidays. You have access to a nurse at all times for urgent questions. Please call the main number to the clinic [phone] and follow the prompts.  For any non-urgent questions, you may also contact your provider using MyChart. We now offer e-Visits for anyone 65 and older to request care online for non-urgent symptoms. For details visit [email or web address].   Also download the MyChart app! Go to the app store, search "MyChart", open the  app, select [place], and log in with your MyChart username and password.

## 2023-10-06 ENCOUNTER — Encounter: Payer: Self-pay | Admitting: Hematology

## 2023-10-06 MED ORDER — MIRTAZAPINE 15 MG PO TABS
15.0000 mg | ORAL_TABLET | Freq: Every day | ORAL | 2 refills | Status: DC
Start: 2023-10-06 — End: 2024-06-08

## 2023-10-06 MED ORDER — OXYCODONE-ACETAMINOPHEN 7.5-325 MG PO TABS: 1.0000 | ORAL_TABLET | Freq: Four times a day (QID) | ORAL | 0 refills | Status: DC | PRN

## 2023-10-06 MED ORDER — ALPRAZOLAM 0.5 MG PO TABS: ORAL_TABLET | ORAL | 0 refills | Status: DC

## 2023-10-08 ENCOUNTER — Inpatient Hospital Stay: Payer: MEDICAID | Admitting: Hematology

## 2023-10-08 ENCOUNTER — Ambulatory Visit: Payer: MEDICAID

## 2023-10-08 ENCOUNTER — Inpatient Hospital Stay: Payer: MEDICAID

## 2023-10-15 ENCOUNTER — Inpatient Hospital Stay: Payer: MEDICAID

## 2023-10-15 ENCOUNTER — Inpatient Hospital Stay: Payer: MEDICAID | Admitting: Hematology

## 2023-10-26 ENCOUNTER — Other Ambulatory Visit: Payer: Self-pay | Admitting: *Deleted

## 2023-10-26 ENCOUNTER — Inpatient Hospital Stay: Payer: MEDICAID | Attending: Hematology

## 2023-10-26 ENCOUNTER — Inpatient Hospital Stay: Payer: MEDICAID

## 2023-10-26 VITALS — BP 140/95 | HR 88 | Temp 98.6°F | Resp 18

## 2023-10-26 DIAGNOSIS — C50111 Malignant neoplasm of central portion of right female breast: Secondary | ICD-10-CM | POA: Insufficient documentation

## 2023-10-26 DIAGNOSIS — C773 Secondary and unspecified malignant neoplasm of axilla and upper limb lymph nodes: Secondary | ICD-10-CM | POA: Insufficient documentation

## 2023-10-26 DIAGNOSIS — Z1731 Human epidermal growth factor receptor 2 positive status: Secondary | ICD-10-CM | POA: Insufficient documentation

## 2023-10-26 DIAGNOSIS — C787 Secondary malignant neoplasm of liver and intrahepatic bile duct: Secondary | ICD-10-CM | POA: Insufficient documentation

## 2023-10-26 DIAGNOSIS — Z5112 Encounter for antineoplastic immunotherapy: Secondary | ICD-10-CM | POA: Diagnosis present

## 2023-10-26 DIAGNOSIS — F1920 Other psychoactive substance dependence, uncomplicated: Secondary | ICD-10-CM

## 2023-10-26 DIAGNOSIS — C7951 Secondary malignant neoplasm of bone: Secondary | ICD-10-CM | POA: Insufficient documentation

## 2023-10-26 LAB — CBC WITH DIFFERENTIAL/PLATELET
Abs Immature Granulocytes: 0.01 10*3/uL (ref 0.00–0.07)
Basophils Absolute: 0.1 10*3/uL (ref 0.0–0.1)
Basophils Relative: 1 %
Eosinophils Absolute: 0.1 10*3/uL (ref 0.0–0.5)
Eosinophils Relative: 1 %
HCT: 36.8 % (ref 36.0–46.0)
Hemoglobin: 11.8 g/dL — ABNORMAL LOW (ref 12.0–15.0)
Immature Granulocytes: 0 %
Lymphocytes Relative: 26 %
Lymphs Abs: 1.4 10*3/uL (ref 0.7–4.0)
MCH: 32.7 pg (ref 26.0–34.0)
MCHC: 32.1 g/dL (ref 30.0–36.0)
MCV: 101.9 fL — ABNORMAL HIGH (ref 80.0–100.0)
Monocytes Absolute: 0.8 10*3/uL (ref 0.1–1.0)
Monocytes Relative: 15 %
Neutro Abs: 3.1 10*3/uL (ref 1.7–7.7)
Neutrophils Relative %: 57 %
Platelets: 255 10*3/uL (ref 150–400)
RBC: 3.61 MIL/uL — ABNORMAL LOW (ref 3.87–5.11)
RDW: 13.1 % (ref 11.5–15.5)
WBC: 5.4 10*3/uL (ref 4.0–10.5)
nRBC: 0 % (ref 0.0–0.2)

## 2023-10-26 LAB — COMPREHENSIVE METABOLIC PANEL
ALT: 19 U/L (ref 0–44)
AST: 22 U/L (ref 15–41)
Albumin: 3.8 g/dL (ref 3.5–5.0)
Alkaline Phosphatase: 64 U/L (ref 38–126)
Anion gap: 12 (ref 5–15)
BUN: 20 mg/dL (ref 6–20)
CO2: 26 mmol/L (ref 22–32)
Calcium: 9.2 mg/dL (ref 8.9–10.3)
Chloride: 102 mmol/L (ref 98–111)
Creatinine, Ser: 0.68 mg/dL (ref 0.44–1.00)
GFR, Estimated: >60 mL/min (ref >60)
Glucose, Bld: 119 mg/dL — ABNORMAL HIGH (ref 70–99)
Potassium: 3.6 mmol/L (ref 3.5–5.1)
Sodium: 140 mmol/L (ref 135–145)
Total Bilirubin: 0.2 mg/dL (ref 0.0–1.2)
Total Protein: 7 g/dL (ref 6.5–8.1)

## 2023-10-26 LAB — RAPID URINE DRUG SCREEN, HOSP PERFORMED
Amphetamines: NOT DETECTED
Barbiturates: NOT DETECTED
Benzodiazepines: NOT DETECTED
Cocaine: NOT DETECTED
Opiates: NOT DETECTED
Tetrahydrocannabinol: NOT DETECTED

## 2023-10-26 LAB — MAGNESIUM: Magnesium: 2.2 mg/dL (ref 1.7–2.4)

## 2023-10-26 MED ORDER — PALONOSETRON HCL INJECTION 0.25 MG/5ML
0.2500 mg | Freq: Once | INTRAVENOUS | Status: AC
  Administered 2023-10-26: 0.25 mg via INTRAVENOUS
  Filled 2023-10-26: qty 5

## 2023-10-26 MED ORDER — ACETAMINOPHEN 325 MG PO TABS
650.0000 mg | ORAL_TABLET | Freq: Once | ORAL | Status: AC
  Administered 2023-10-26: 650 mg via ORAL
  Filled 2023-10-26: qty 2

## 2023-10-26 MED ORDER — DEXTROSE 5 % IV SOLN
Freq: Once | INTRAVENOUS | Status: AC
Start: 2023-10-26 — End: 2023-10-26

## 2023-10-26 MED ORDER — SODIUM CHLORIDE 0.9 % IV SOLN
150.0000 mg | Freq: Once | INTRAVENOUS | Status: AC
  Administered 2023-10-26: 150 mg via INTRAVENOUS
  Filled 2023-10-26: qty 150

## 2023-10-26 MED ORDER — DEXAMETHASONE SODIUM PHOSPHATE 10 MG/ML IJ SOLN
10.0000 mg | Freq: Once | INTRAMUSCULAR | Status: AC
  Administered 2023-10-26: 10 mg via INTRAVENOUS
  Filled 2023-10-26: qty 1

## 2023-10-26 MED ORDER — SODIUM CHLORIDE 0.9% FLUSH
10.0000 mL | Freq: Once | INTRAVENOUS | Status: AC
  Administered 2023-10-26: 10 mL via INTRAVENOUS

## 2023-10-26 MED ORDER — CETIRIZINE HCL 10 MG/ML IV SOLN
10.0000 mg | Freq: Once | INTRAVENOUS | Status: AC
  Administered 2023-10-26: 10 mg via INTRAVENOUS
  Filled 2023-10-26: qty 1

## 2023-10-26 MED ORDER — FAM-TRASTUZUMAB DERUXTECAN-NXKI CHEMO 100 MG IV SOLR
5.4000 mg/kg | Freq: Once | INTRAVENOUS | Status: AC
  Administered 2023-10-26: 300 mg via INTRAVENOUS
  Filled 2023-10-26: qty 15

## 2023-10-26 MED ORDER — HEPARIN SOD (PORK) LOCK FLUSH 100 UNIT/ML IV SOLN
500.0000 [IU] | Freq: Once | INTRAVENOUS | Status: AC | PRN
  Administered 2023-10-26: 500 [IU]

## 2023-10-26 NOTE — Progress Notes (Signed)
 Urine drug screen sent per orders to have her pain medication refilled.   Patient tolerated chemotherapy with no complaints voiced.  Side effects with management reviewed with understanding verbalized.  Port site clean and dry with no bruising or swelling noted at site.  Good blood return noted before and after administration of chemotherapy.  Band aid applied.  Patient left in satisfactory condition with VSS and no s/s of distress noted. All follow ups as scheduled.   Sara [name] [employer]

## 2023-10-26 NOTE — Patient Instructions (Signed)
 [facility] [place] [facility]  Discharge Instructions: Thank you for choosing [facility] to provide your oncology and hematology care.  If you have a lab appointment with the Cancer Center - please note that after April 8th, 2024, all labs will be drawn in the cancer center.  You do not have to check in or register with the main entrance as you have in the past but will complete your check-in in the cancer center.  Wear comfortable clothing and clothing appropriate for easy access to any Portacath or PICC line.   We strive to give you quality time with your provider. You may need to reschedule your appointment if you arrive late (15 or more minutes).  Arriving late affects you and other patients whose appointments are after yours.  Also, if you miss three or more appointments without notifying the office, you may be dismissed from the clinic at the provider's discretion.      For prescription refill requests, have your pharmacy contact our office and allow 72 hours for refills to be completed.    Today you received the following chemotherapy and/or immunotherapy agents Enhertu   To help prevent nausea and vomiting after your treatment, we encourage you to take your nausea medication as directed.  BELOW ARE SYMPTOMS THAT SHOULD BE REPORTED IMMEDIATELY: *FEVER GREATER THAN 100.4 F (38 C) OR HIGHER *CHILLS OR SWEATING *NAUSEA AND VOMITING THAT IS NOT CONTROLLED WITH YOUR NAUSEA MEDICATION *UNUSUAL SHORTNESS OF BREATH *UNUSUAL BRUISING OR BLEEDING *URINARY PROBLEMS (pain or burning when urinating, or frequent urination) *BOWEL PROBLEMS (unusual diarrhea, constipation, pain near the anus) TENDERNESS IN MOUTH AND THROAT WITH OR WITHOUT PRESENCE OF ULCERS (sore throat, sores in mouth, or a toothache) UNUSUAL RASH, SWELLING OR PAIN  UNUSUAL VAGINAL DISCHARGE OR ITCHING   Items with * indicate a potential emergency and should be followed up as  soon as possible or go to the Emergency Department if any problems should occur.  Please show the CHEMOTHERAPY ALERT CARD or IMMUNOTHERAPY ALERT CARD at check-in to the Emergency Department and triage nurse.  Should you have questions after your visit or need to cancel or reschedule your appointment, please contact [facility] [place] - A DEPT OF [facility] [phone]  and follow the prompts.  Office hours are 8:00 a.m. to 4:30 p.m. Monday - Friday. Please note that voicemails left after 4:00 p.m. may not be returned until the following business day.  We are closed weekends and major holidays. You have access to a nurse at all times for urgent questions. Please call the main number to the clinic [phone] and follow the prompts.  For any non-urgent questions, you may also contact your provider using MyChart. We now offer e-Visits for anyone 41 and older to request care online for non-urgent symptoms. For details visit [email or web address].   Also download the MyChart app! Go to the app store, search "MyChart", open the app, select , and log in with your MyChart username and password.

## 2023-10-27 ENCOUNTER — Other Ambulatory Visit: Payer: Self-pay | Admitting: *Deleted

## 2023-10-27 DIAGNOSIS — G893 Neoplasm related pain (acute) (chronic): Secondary | ICD-10-CM

## 2023-10-27 DIAGNOSIS — Z1731 Human epidermal growth factor receptor 2 positive status: Secondary | ICD-10-CM

## 2023-10-27 DIAGNOSIS — F419 Anxiety disorder, unspecified: Secondary | ICD-10-CM

## 2023-10-27 MED ORDER — OXYCODONE-ACETAMINOPHEN 7.5-325 MG PO TABS: 1.0000 | ORAL_TABLET | Freq: Four times a day (QID) | ORAL | 0 refills | Status: DC | PRN

## 2023-10-29 ENCOUNTER — Other Ambulatory Visit: Payer: MEDICAID

## 2023-10-29 ENCOUNTER — Ambulatory Visit: Payer: MEDICAID

## 2023-10-29 ENCOUNTER — Ambulatory Visit: Payer: MEDICAID | Admitting: Hematology

## 2023-11-02 ENCOUNTER — Ambulatory Visit (HOSPITAL_COMMUNITY): Admission: RE | Admit: 2023-11-02 | Payer: MEDICAID | Source: Ambulatory Visit

## 2023-11-03 NOTE — Progress Notes (Signed)
 VM received from patient reporting a sore throat. Patient reports her child has similar symptoms. Attempted to call patient back, unable to reach patient directly. Detailed VM left asking that the patient go to urgent care to be seen, tested, and treated appropriately for her symptoms.

## 2023-11-05 ENCOUNTER — Ambulatory Visit: Payer: MEDICAID

## 2023-11-05 ENCOUNTER — Other Ambulatory Visit: Payer: MEDICAID

## 2023-11-05 ENCOUNTER — Ambulatory Visit: Payer: MEDICAID | Admitting: Hematology

## 2023-11-16 ENCOUNTER — Inpatient Hospital Stay: Payer: MEDICAID

## 2023-11-16 ENCOUNTER — Inpatient Hospital Stay: Payer: MEDICAID | Admitting: Hematology

## 2023-11-16 NOTE — Progress Notes (Incomplete)
 [facility] [address], [place] [address]    Clinic Day:  11/16/2023  Referring physician: Toma Deiters, MD  Patient Care Team: Toma Deiters, MD as PCP - General (Internal Medicine) Doreatha Massed, MD as Medical Oncologist (Medical Oncology) Therese Sarah, RN as Oncology Nurse Navigator (Oncology)   ASSESSMENT & PLAN:   Assessment: 1. Her2+ metastatic breast cancer: - Sara reports feeling a knot in the right breast in July 2022.  Sara felt the same lump in September which has increased slightly in November.  In the last 3 months, it has increased in size from a quarter to size of an orange. - Mammogram/ultrasound on 10/01/2021: Large irregular mass involving the entire central right breast measuring 4.9 x 2.4 x 4 cm at 12:00.  At least 3 lymph nodes in the right axilla.  Overlying skin thickening and generalized erythema and marked tenderness. - Right breast mass and axillary lymph node biopsy on 10/08/2021: - Pathology: Poorly differentiated ductal carcinoma, grade 3, HER2 3+ positive, ER 5% strong staining intensity, PR negative, Ki-67 25%. - PET scan (11/30/2021): Hypermetabolic tumor involving right breast, metastatic mediastinal and hilar lymphadenopathy, hepatic metastatic disease, diffuse lytic bone lesions. - 4 cycles of THP from 12/03/2021 through 02/25/2022 - Cerebellar mass resection (11/12/2022): Metastatic carcinoma with tumor cells positive for CK7, GATA3, indicating breast primary.  ER and CK20 are negative.  HER2 is 3+. - Enhertu started on 01/01/2023   2. Social/family history: - Sara is currently living with her father, youngest daughter and sister.  Sara has 2 daughters ages 87 and 63.  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  Sara quit smoking 3 months ago.  Prior to that Sara smoked a pack a day for 4 to 5 years and less than half pack a day since 2011.  Sara vapes occasionally at this time. - Paternal great grandmother had  lung cancer.  Paternal aunt had lung cancer at age 17.  Paternal grandmother had breast cancer in her 47s.  Paternal grandfather had colon cancer.  Maternal grandfather had colon cancer.    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 - Sara had CT CAP on 09/28/2023.  Results are pending at this time. - Sara reported feeling off balance for 4 days after last treatment.  Sara has some diarrhea but gets constipated if Sara takes Imodium.  Sara missed appointments last 2 weeks and reports personal problems.  Reviewed labs [name]: Normal LFTs and creatinine.  CBC grossly normal.  CA 15-3 and CA 27-29 were normal. - Sara will come back on Monday to receive her treatment.  RTC 3 weeks for follow-up.  2.  Difficulty falling asleep: - Continue Remeron 15 mg at bedtime which is helping.  3.  Anxiety: - Continue Xanax 0.5 mg daily as needed.  Anxiety symptoms well-controlled.  4.  Chest pain, back pain, right ankle pain: - We reviewed the urine drug screen from [name].  It was positive for cocaine.  It was also positive for [facility] and Sara admits taking Gummies. -'s urine is positive for cocaine, I would not give hydrocodone refill [name].   5.  High risk drug monitoring: - 2D echo on 08/03/2023 with LVEF 60 to 65%.  Will repeat echo prior to next visit.    No orders of the defined types were placed in this encounter.  Alben Deeds Teague,acting as a Neurosurgeon for Doreatha Massed, MD.,have documented all relevant documentation on the behalf of Doreatha Massed, MD,as directed by  Doreatha Massed, MD while in the presence of Doreatha Massed, MD.  ***    [place] R Teague   3/31/202510:09 AM  CHIEF COMPLAINT:   Diagnosis: Her2+ metastatic right breast cancer    Cancer Staging  HER2-positive carcinoma of right breast [facility]) Staging form: Breast, AJCC 8th Edition - Clinical stage from 10/28/2021: Stage IV  (cT4d, cN1, cM1, G3, ER+, PR-, HER2+) - Unsigned    Prior Therapy: 1. DOCEtaxel + Trastuzumab + Pertuzumab (THP) q21d x 4 cycles  2. SRS to brain metastases-- 10 fractions from 12/05/22 through 12/19/22, 1 fraction on 05/13/23  Current Therapy:  Enhertu    HISTORY OF PRESENT ILLNESS:   Oncology History  HER2-positive carcinoma of right breast (HCC)  10/28/2021 Initial Diagnosis   Breast cancer, right breast (HCC)   12/03/2021 - 02/25/2022 Chemotherapy   Patient is on Treatment Plan : BREAST DOCEtaxel + Trastuzumab + Pertuzumab (THP) q21d x 8 cycles / Trastuzumab + Pertuzumab q21d x 4 cycles     01/01/2023 -  Chemotherapy   Patient is on Treatment Plan : BREAST METASTATIC Fam-Trastuzumab Deruxtecan-nxki (Enhertu) (5.4) q21d        INTERVAL HISTORY:   Sara [name] is a 48 y.o. female presenting to clinic [name] for follow up of Her2+ metastatic right breast cancer. Sara was last seen by me on 10/01/23.  [name], Sara [name]. [name]. Her energy level is at ***%.    PAST MEDICAL HISTORY:   Past Medical History: Past Medical History:  Diagnosis Date   Anxiety    Back pain    Cancer [facility])     Surgical History: Past Surgical History:  Procedure Laterality Date   CHOLECYSTECTOMY     ORTHOPEDIC SURGERY     PORTACATH PLACEMENT Left 11/18/2021   Procedure: INSERTION PORT-A-CATH;  Surgeon: Lucretia Roers, MD;  Location: [facility];  Service: General;  Laterality: Left;   SUBOCCIPITAL CRANIECTOMY CERVICAL LAMINECTOMY N/A 11/12/2022   Procedure: SUBOCCIPITAL CRANIECTOMY FOR RESECTION OF CEREBELLAR MASS;  Surgeon: Donalee Citrin, MD;  Location: [facility] OR;  Service: Neurosurgery;  Laterality: N/A;    Social History: Social History   Socioeconomic History   Marital status: Legally Separated    Spouse name: Not on file   Number of children: Not on file   Years of education: Not on file   Highest education level: Not on file  Occupational History    Not on file  Tobacco Use   Smoking status: Former    Current packs/day: 0.00    Average packs/day: 1 pack/day for 5.0 years (5.0 ttl pk-yrs)    Types: Cigarettes    Start date: 11/08/2016    Quit date: 11/08/2021    Years since quitting: 2.0   Smokeless tobacco: Never  Vaping Use   Vaping status: Never Used  Substance and Sexual Activity   Alcohol use: No    Comment: occasionally   Drug use: No   Sexual activity: Yes    Birth control/protection: None  Other Topics Concern   Not on file  Social History Narrative   Not on file   Social Drivers of Health   Financial Resource Strain: High Risk (12/03/2021)   [name] Financial Resource Strain (CARDIA)    Difficulty of Paying Living Expenses: Very hard  Food Insecurity: No Food Insecurity (09/17/2023)  Hunger Vital Sign    Worried About Running Out of Food in the Last Year: Never true    Ran Out of Food in the Last Year: Never true  Transportation Needs: No Transportation Needs (11/25/2022)   PRAPARE - Administrator, Civil Service (Medical): No    Lack of Transportation (Non-Medical): No  Recent Concern: Transportation Needs - Unmet Transportation Needs (11/11/2022)   PRAPARE - Transportation    Lack of Transportation (Medical): Yes    Lack of Transportation (Non-Medical): Yes  Physical Activity: Sufficiently Active (06/17/2021)   Exercise Vital Sign    Days of Exercise per Week: 5 days    Minutes of Exercise per Session: 30 min  Stress: Stress Concern Present (06/17/2021)   [employer] of Occupational Health - Occupational Stress Questionnaire    Feeling of Stress : Rather much  Social Connections: Socially Isolated (06/17/2021)   Social Connection and Isolation Panel [NHANES]    Frequency of Communication with Friends and Family: Twice a week    Frequency of Social Gatherings with Friends and Family: Never    Attends Religious Services: 1 to 4 times per year    Active Member of [employer] or Organizations: No     Attends Banker Meetings: Never    Marital Status: Divorced  Catering manager Violence: Not At Risk (09/17/2023)   Humiliation, Afraid, Rape, and Kick questionnaire    Fear of Current or Ex-Partner: No    Emotionally Abused: No    Physically Abused: No    Sexually Abused: No    Family History: Family History  Problem Relation Age of Onset   Heart disease Brother    Lung cancer Paternal Grandmother        lung   Cancer Paternal Grandfather    Asthma Daughter    Asthma Daughter     Current Medications:  Current Outpatient Medications:    ALPRAZolam (XANAX) 0.5 MG tablet, TAKE 1 TABLET BY MOUTH AT BEDTIME AS NEEDED FOR ANXIETY, Disp: 30 tablet, Rfl: 0   docusate sodium (COLACE) 100 MG capsule, Take 1 capsule (100 mg total) by mouth 2 (two) times daily., Disp: , Rfl:    furosemide (LASIX) 20 MG tablet, Take 1 tablet (20 mg total) by mouth daily as needed., Disp: 30 tablet, Rfl: 0   lidocaine-prilocaine (EMLA) cream, Apply 1 Application topically as needed., Disp: 30 g, Rfl: 0   mirtazapine (REMERON) 15 MG tablet, Take 1 tablet (15 mg total) by mouth at bedtime., Disp: 30 tablet, Rfl: 2   ondansetron (ZOFRAN) 8 MG tablet, TAKE 1 TABLET BY MOUTH EVERY 8 HOURS AS NEEDED FOR NAUSEA FOR VOMITING, Disp: 90 tablet, Rfl: 5   oxyCODONE-acetaminophen (PERCOCET) 7.5-325 MG tablet, Take 1 tablet by mouth every 6 (six) hours as needed for severe pain (pain score 7-10)., Disp: 84 tablet, Rfl: 0   pantoprazole (PROTONIX) 20 MG tablet, Take 1 tablet (20 mg total) by mouth daily., Disp: 30 tablet, Rfl: 5   prochlorperazine (COMPAZINE) 10 MG tablet, Take 1 tablet (10 mg total) by mouth every 6 (six) hours as needed for nausea or vomiting., Disp: 30 tablet, Rfl: 5   Allergies: Allergies  Allergen Reactions   Hydrocodone Itching, Nausea And Vomiting, Rash and Other (See Comments)    Hallucinations when given vicodin post crani in 2024   Sulfa Antibiotics Nausea And Vomiting   Morphine  And Codeine Rash    REVIEW OF SYSTEMS:   Review of Systems  Constitutional:  Negative for  chills, fatigue and fever.  HENT:   Negative for lump/mass, mouth sores, nosebleeds, sore throat and trouble swallowing.   Eyes:  Negative for eye problems.  Respiratory:  Negative for cough and shortness of breath.   Cardiovascular:  Negative for chest pain, leg swelling and palpitations.  Gastrointestinal:  Negative for abdominal pain, constipation, diarrhea, nausea and vomiting.  Genitourinary:  Negative for bladder incontinence, difficulty urinating, dysuria, frequency, hematuria and nocturia.   Musculoskeletal:  Negative for arthralgias, back pain, flank pain, myalgias and neck pain.  Skin:  Negative for itching and rash.  Neurological:  Negative for dizziness, headaches and numbness.  Hematological:  Does not bruise/bleed easily.  Psychiatric/Behavioral:  Negative for depression, sleep disturbance and suicidal ideas. The patient is not nervous/anxious.   All other systems reviewed and are negative.    VITALS:   There were no vitals taken for this visit.  Wt Readings from Last 3 Encounters:  10/26/23 124 lb 5.4 oz (56.4 kg)  10/05/23 123 lb (55.8 kg)  10/01/23 120 lb 9.5 oz (54.7 kg)    There is no height or weight on file to calculate BMI.  Performance status (ECOG): 1 - Symptomatic but completely ambulatory  PHYSICAL EXAM:   Physical Exam Vitals and nursing note reviewed. Exam conducted with a chaperone present.  Constitutional:      Appearance: Normal appearance.  Cardiovascular:     Rate and Rhythm: Normal rate and regular rhythm.     Pulses: Normal pulses.     Heart sounds: Normal heart sounds.  Pulmonary:     Effort: Pulmonary effort is normal.     Breath sounds: Normal breath sounds.  Abdominal:     Palpations: Abdomen is soft. There is no hepatomegaly, splenomegaly or mass.     Tenderness: There is no abdominal tenderness.  Musculoskeletal:     Right lower leg: No  edema.     Left lower leg: No edema.  Lymphadenopathy:     Cervical: No cervical adenopathy.     Right cervical: No superficial, deep or posterior cervical adenopathy.    Left cervical: No superficial, deep or posterior cervical adenopathy.     Upper Body:     Right upper body: No supraclavicular or axillary adenopathy.     Left upper body: No supraclavicular or axillary adenopathy.  Neurological:     General: No focal deficit present.     Mental Status: Sara is alert and oriented to person, place, and time.  Psychiatric:        Mood and Affect: Mood normal.        Behavior: Behavior normal.     LABS:      Latest Ref Rng & Units 10/26/2023    1:33 PM 10/01/2023   11:01 AM 08/27/2023   12:26 PM  CBC  WBC 4.0 - 10.5 K/uL 5.4  8.9  5.7   Hemoglobin 12.0 - 15.0 g/dL 47.8  29.5  62.1   Hematocrit 36.0 - 46.0 % 36.8  40.9  36.7   Platelets 150 - 400 K/uL 255  220  299       Latest Ref Rng & Units 10/26/2023    1:33 PM 10/01/2023   11:01 AM 08/27/2023   12:26 PM  CMP  Glucose 70 - 99 mg/dL 308  657  96   BUN 6 - 20 mg/dL 20  30  21    Creatinine 0.44 - 1.00 mg/dL 8.46  9.62  9.52   Sodium 135 - 145 mmol/L 140  138  138   Potassium 3.5 - 5.1 mmol/L 3.6  4.0  3.6   Chloride 98 - 111 mmol/L 102  101  104   CO2 22 - 32 mmol/L 26  26  26    Calcium 8.9 - 10.3 mg/dL 9.2  9.4  9.1   Total Protein 6.5 - 8.1 g/dL 7.0  7.4  7.0   Total Bilirubin 0.0 - 1.2 mg/dL 0.2  0.4  <6.2   Alkaline Phos 38 - 126 U/L 64  59  64   AST 15 - 41 U/L 22  28  27    ALT 0 - 44 U/L 19  21  27       No results found for: "CEA1", "CEA" / No results found for: "CEA1", "CEA" No results found for: "PSA1" No results found for: "ZHY865" No results found for: "CAN125"  No results found for: "TOTALPROTELP", "ALBUMINELP", "A1GS", "A2GS", "BETS", "BETA2SER", "GAMS", "MSPIKE", "SPEI" No results found for: "TIBC", "FERRITIN", "IRONPCTSAT" No results found for: "LDH"   STUDIES:   No results found.

## 2023-11-23 NOTE — Progress Notes (Incomplete)
 [facility] [address], [place] [address]    Clinic Day:  11/23/2023  Referring physician: Toma Deiters, MD  Patient Care Team: Toma Deiters, MD as PCP - General (Internal Medicine) Doreatha Massed, MD as Medical Oncologist (Medical Oncology) Therese Sarah, RN as Oncology Nurse Navigator (Oncology)   ASSESSMENT & PLAN:   Assessment: 1. Her2+ metastatic breast cancer: - She reports feeling a knot in the right breast in July 2022.  She felt the same lump in September which has increased slightly in November.  In the last 3 months, it has increased in size from a quarter to size of an orange. - Mammogram/ultrasound on 10/01/2021: Large irregular mass involving the entire central right breast measuring 4.9 x 2.4 x 4 cm at 12:00.  At least 3 lymph nodes in the right axilla.  Overlying skin thickening and generalized erythema and marked tenderness. - Right breast mass and axillary lymph node biopsy on 10/08/2021: - Pathology: Poorly differentiated ductal carcinoma, grade 3, HER2 3+ positive, ER 5% strong staining intensity, PR negative, Ki-67 25%. - PET scan (11/30/2021): Hypermetabolic tumor involving right breast, metastatic mediastinal and hilar lymphadenopathy, hepatic metastatic disease, diffuse lytic bone lesions. - 4 cycles of THP from 12/03/2021 through 02/25/2022 - Cerebellar mass resection (11/12/2022): Metastatic carcinoma with tumor cells positive for CK7, GATA3, indicating breast primary.  ER and CK20 are negative.  HER2 is 3+. - Enhertu started on 01/01/2023   2. Social/family history: - She is currently living with her father, youngest daughter and sister.  She has 2 daughters ages 27 and 63.  She has worked in Journalist, newspaper previously.  She quit smoking 3 months ago.  Prior to that she smoked a pack a day for 4 to 5 years and less than half pack a day since 2011.  She vapes occasionally at this time. - Paternal great grandmother had  lung cancer.  Paternal aunt had lung cancer at age 30.  Paternal grandmother had breast cancer in her 25s.  Paternal grandfather had colon cancer.  Maternal grandfather had colon cancer.    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 - She had CT CAP on 09/28/2023.  Results are pending at this time. - She reported feeling off balance for 4 days after last treatment.  She has some diarrhea but gets constipated if she takes Imodium.  She missed appointments last 2 weeks and reports personal problems.  Reviewed labs today: Normal LFTs and creatinine.  CBC grossly normal.  CA 15-3 and CA 27-29 were normal. - She will come back on Monday to receive her treatment.  RTC 3 weeks for follow-up.  2.  Difficulty falling asleep: - Continue Remeron 15 mg at bedtime which is helping.  3.  Anxiety: - Continue Xanax 0.5 mg daily as needed.  Anxiety symptoms well-controlled.  4.  Chest pain, back pain, right ankle pain: - We reviewed the urine drug screen from today.  It was positive for cocaine.  It was also positive for [facility] and she admits taking Gummies. -'s urine is positive for cocaine, I would not give hydrocodone refill today.   5.  High risk drug monitoring: - 2D echo on 08/03/2023 with LVEF 60 to 65%.  Will repeat echo prior to next visit.    No orders of the defined types were placed in this encounter.  Alben Deeds Teague,acting as a Neurosurgeon for Doreatha Massed, MD.,have documented all relevant documentation on the behalf of Doreatha Massed, MD,as directed by  Doreatha Massed, MD while in the presence of Doreatha Massed, MD.  ***    [place]   4/7/20259:47 AM  CHIEF COMPLAINT:   Diagnosis: Her2+ metastatic right breast cancer    Cancer Staging  HER2-positive carcinoma of right breast [facility]) Staging form: Breast, AJCC 8th Edition - Clinical stage from 10/28/2021: Stage IV  (cT4d, cN1, cM1, G3, ER+, PR-, HER2+) - Unsigned    Prior Therapy: 1. DOCEtaxel + Trastuzumab + Pertuzumab (THP) q21d x 4 cycles  2. SRS to brain metastases-- 10 fractions from 12/05/22 through 12/19/22, 1 fraction on 05/13/23  Current Therapy:  Enhertu    HISTORY OF PRESENT ILLNESS:   Oncology History  HER2-positive carcinoma of right breast (HCC)  10/28/2021 Initial Diagnosis   Breast cancer, right breast (HCC)   12/03/2021 - 02/25/2022 Chemotherapy   Patient is on Treatment Plan : BREAST DOCEtaxel + Trastuzumab + Pertuzumab (THP) q21d x 8 cycles / Trastuzumab + Pertuzumab q21d x 4 cycles     01/01/2023 -  Chemotherapy   Patient is on Treatment Plan : BREAST METASTATIC Fam-Trastuzumab Deruxtecan-nxki (Enhertu) (5.4) q21d        INTERVAL HISTORY:   Sara [name] is a 48 y.o. female presenting to clinic today for follow up of Her2+ metastatic right breast cancer. She was last seen by me on 10/01/23.  Today, she states that she is doing well overall. [name]. Her energy level is at ***%. She is accompanied by a family member.    PAST MEDICAL HISTORY:   Past Medical History: Past Medical History:  Diagnosis Date   Anxiety    Back pain    Cancer [facility])     Surgical History: Past Surgical History:  Procedure Laterality Date   CHOLECYSTECTOMY     ORTHOPEDIC SURGERY     PORTACATH PLACEMENT Left 11/18/2021   Procedure: INSERTION PORT-A-CATH;  Surgeon: Lucretia Roers, MD;  Location: [facility];  Service: General;  Laterality: Left;   SUBOCCIPITAL CRANIECTOMY CERVICAL LAMINECTOMY N/A 11/12/2022   Procedure: SUBOCCIPITAL CRANIECTOMY FOR RESECTION OF CEREBELLAR MASS;  Surgeon: Donalee Citrin, MD;  Location: [facility] OR;  Service: Neurosurgery;  Laterality: N/A;    Social History: Social History   Socioeconomic History   Marital status: Legally Separated    Spouse name: Not on file   Number of children: Not on file   Years of education: Not on file   Highest education level:  Not on file  Occupational History   Not on file  Tobacco Use   Smoking status: Former    Current packs/day: 0.00    Average packs/day: 1 pack/day for 5.0 years (5.0 ttl pk-yrs)    Types: Cigarettes    Start date: 11/08/2016    Quit date: 11/08/2021    Years since quitting: 2.0   Smokeless tobacco: Never  Vaping Use   Vaping status: Never Used  Substance and Sexual Activity   Alcohol use: No    Comment: occasionally   Drug use: No   Sexual activity: Yes    Birth control/protection: None  Other Topics Concern   Not on file  Social History Narrative   Not on file   Social Drivers of Health   Financial Resource Strain: High Risk (12/03/2021)   Overall Financial Resource Strain (CARDIA)    Difficulty of Paying Living Expenses: Very hard  Food Insecurity: No Food Insecurity (09/17/2023)   Hunger Vital Sign    Worried About Running Out of Food in the Last Year: Never true    Ran Out of Food in the Last Year: Never true  Transportation Needs: No Transportation Needs (11/25/2022)   PRAPARE - Administrator, Civil Service (Medical): No    Lack of Transportation (Non-Medical): No  Recent Concern: Transportation Needs - Unmet Transportation Needs (11/11/2022)   PRAPARE - Transportation    Lack of Transportation (Medical): Yes    Lack of Transportation (Non-Medical): Yes  Physical Activity: Sufficiently Active (06/17/2021)   Exercise Vital Sign    Days of Exercise per Week: 5 days    Minutes of Exercise per Session: 30 min  Stress: Stress Concern Present (06/17/2021)   [employer] of Occupational Health - Occupational Stress Questionnaire    Feeling of Stress : Rather much  Social Connections: Socially Isolated (06/17/2021)   Social Connection and Isolation Panel [NHANES]    Frequency of Communication with Friends and Family: Twice a week    Frequency of Social Gatherings with Friends and Family: Never    Attends Religious Services: 1 to 4 times per year    Active  Member of [employer] or Organizations: No    Attends Banker Meetings: Never    Marital Status: Divorced  Catering manager Violence: Not At Risk (09/17/2023)   Humiliation, Afraid, Rape, and Kick questionnaire    Fear of Current or Ex-Partner: No    Emotionally Abused: No    Physically Abused: No    Sexually Abused: No    Family History: Family History  Problem Relation Age of Onset   Heart disease Brother    Lung cancer Paternal Grandmother        lung   Cancer Paternal Grandfather    Asthma Daughter    Asthma Daughter     Current Medications:  Current Outpatient Medications:    ALPRAZolam (XANAX) 0.5 MG tablet, TAKE 1 TABLET BY MOUTH AT BEDTIME AS NEEDED FOR ANXIETY, Disp: 30 tablet, Rfl: 0   docusate sodium (COLACE) 100 MG capsule, Take 1 capsule (100 mg total) by mouth 2 (two) times daily., Disp: , Rfl:    furosemide (LASIX) 20 MG tablet, Take 1 tablet (20 mg total) by mouth daily as needed., Disp: 30 tablet, Rfl: 0   lidocaine-prilocaine (EMLA) cream, Apply 1 Application topically as needed., Disp: 30 g, Rfl: 0   mirtazapine (REMERON) 15 MG tablet, Take 1 tablet (15 mg total) by mouth at bedtime., Disp: 30 tablet, Rfl: 2   ondansetron (ZOFRAN) 8 MG tablet, TAKE 1 TABLET BY MOUTH EVERY 8 HOURS AS NEEDED FOR NAUSEA FOR VOMITING, Disp: 90 tablet, Rfl: 5   oxyCODONE-acetaminophen (PERCOCET) 7.5-325 MG tablet, Take 1 tablet by mouth every 6 (six) hours as needed for severe pain (pain score 7-10)., Disp: 84 tablet, Rfl: 0   pantoprazole (PROTONIX) 20 MG tablet, Take 1 tablet (20 mg total) by mouth daily., Disp: 30 tablet, Rfl: 5   prochlorperazine (COMPAZINE) 10 MG tablet, Take 1 tablet (10 mg total) by mouth every 6 (six) hours as needed for nausea or vomiting., Disp: 30 tablet, Rfl: 5   Allergies: Allergies  Allergen Reactions   Hydrocodone Itching, Nausea And Vomiting, Rash and Other (See Comments)    Hallucinations when given vicodin post crani in 2024   Sulfa  Antibiotics Nausea And Vomiting   Morphine And Codeine Rash    REVIEW OF SYSTEMS:  Review of Systems  Constitutional:  Negative for chills, fatigue and fever.  HENT:   Negative for lump/mass, mouth sores, nosebleeds, sore throat and trouble swallowing.   Eyes:  Negative for eye problems.  Respiratory:  Negative for cough and shortness of breath.   Cardiovascular:  Negative for chest pain, leg swelling and palpitations.  Gastrointestinal:  Negative for abdominal pain, constipation, diarrhea, nausea and vomiting.  Genitourinary:  Negative for bladder incontinence, difficulty urinating, dysuria, frequency, hematuria and nocturia.   Musculoskeletal:  Negative for arthralgias, back pain, flank pain, myalgias and neck pain.  Skin:  Negative for itching and rash.  Neurological:  Negative for dizziness, headaches and numbness.  Hematological:  Does not bruise/bleed easily.  Psychiatric/Behavioral:  Negative for depression, sleep disturbance and suicidal ideas. The patient is not nervous/anxious.   All other systems reviewed and are negative.    VITALS:   There were no vitals taken for this visit.  Wt Readings from Last 3 Encounters:  10/26/23 124 lb 5.4 oz (56.4 kg)  10/05/23 123 lb (55.8 kg)  10/01/23 120 lb 9.5 oz (54.7 kg)    There is no height or weight on file to calculate BMI.  Performance status (ECOG): 1 - Symptomatic but completely ambulatory  PHYSICAL EXAM:   Physical Exam Vitals and nursing note reviewed. Exam conducted with a chaperone present.  Constitutional:      Appearance: Normal appearance.  Cardiovascular:     Rate and Rhythm: Normal rate and regular rhythm.     Pulses: Normal pulses.     Heart sounds: Normal heart sounds.  Pulmonary:     Effort: Pulmonary effort is normal.     Breath sounds: Normal breath sounds.  Abdominal:     Palpations: Abdomen is soft. There is no hepatomegaly, splenomegaly or mass.     Tenderness: There is no abdominal tenderness.   Musculoskeletal:     Right lower leg: No edema.     Left lower leg: No edema.  Lymphadenopathy:     Cervical: No cervical adenopathy.     Right cervical: No superficial, deep or posterior cervical adenopathy.    Left cervical: No superficial, deep or posterior cervical adenopathy.     Upper Body:     Right upper body: No supraclavicular or axillary adenopathy.     Left upper body: No supraclavicular or axillary adenopathy.  Neurological:     General: No focal deficit present.     Mental Status: She is alert and oriented to person, place, and time.  Psychiatric:        Mood and Affect: Mood normal.        Behavior: Behavior normal.     LABS:      Latest Ref Rng & Units 10/26/2023    1:33 PM 10/01/2023   11:01 AM 08/27/2023   12:26 PM  CBC  WBC 4.0 - 10.5 K/uL 5.4  8.9  5.7   Hemoglobin 12.0 - 15.0 g/dL 16.1  09.6  04.5   Hematocrit 36.0 - 46.0 % 36.8  40.9  36.7   Platelets 150 - 400 K/uL 255  220  299       Latest Ref Rng & Units 10/26/2023    1:33 PM 10/01/2023   11:01 AM 08/27/2023   12:26 PM  CMP  Glucose 70 - 99 mg/dL 409  811  96   BUN 6 - 20 mg/dL 20  30  21    Creatinine 0.44 - 1.00 mg/dL 9.14  7.82  9.56  Sodium 135 - 145 mmol/L 140  138  138   Potassium 3.5 - 5.1 mmol/L 3.6  4.0  3.6   Chloride 98 - 111 mmol/L 102  101  104   CO2 22 - 32 mmol/L 26  26  26    Calcium 8.9 - 10.3 mg/dL 9.2  9.4  9.1   Total Protein 6.5 - 8.1 g/dL 7.0  7.4  7.0   Total Bilirubin 0.0 - 1.2 mg/dL 0.2  0.4  <2.1   Alkaline Phos 38 - 126 U/L 64  59  64   AST 15 - 41 U/L 22  28  27    ALT 0 - 44 U/L 19  21  27       No results found for: "CEA1", "CEA" / No results found for: "CEA1", "CEA" No results found for: "PSA1" No results found for: "HYQ657" No results found for: "CAN125"  No results found for: "TOTALPROTELP", "ALBUMINELP", "A1GS", "A2GS", "BETS", "BETA2SER", "GAMS", "MSPIKE", "SPEI" No results found for: "TIBC", "FERRITIN", "IRONPCTSAT" No results found for:  "LDH"   STUDIES:   No results found.

## 2023-11-24 ENCOUNTER — Inpatient Hospital Stay: Payer: MEDICAID

## 2023-11-24 ENCOUNTER — Inpatient Hospital Stay: Payer: MEDICAID | Admitting: Hematology

## 2023-11-24 ENCOUNTER — Inpatient Hospital Stay: Payer: MEDICAID | Attending: Hematology

## 2023-11-24 DIAGNOSIS — Z1731 Human epidermal growth factor receptor 2 positive status: Secondary | ICD-10-CM | POA: Insufficient documentation

## 2023-11-24 DIAGNOSIS — C787 Secondary malignant neoplasm of liver and intrahepatic bile duct: Secondary | ICD-10-CM | POA: Insufficient documentation

## 2023-11-24 DIAGNOSIS — C773 Secondary and unspecified malignant neoplasm of axilla and upper limb lymph nodes: Secondary | ICD-10-CM | POA: Insufficient documentation

## 2023-11-24 DIAGNOSIS — C50111 Malignant neoplasm of central portion of right female breast: Secondary | ICD-10-CM | POA: Insufficient documentation

## 2023-11-24 DIAGNOSIS — Z87891 Personal history of nicotine dependence: Secondary | ICD-10-CM | POA: Insufficient documentation

## 2023-11-24 DIAGNOSIS — Z5112 Encounter for antineoplastic immunotherapy: Secondary | ICD-10-CM | POA: Insufficient documentation

## 2023-11-24 DIAGNOSIS — C7951 Secondary malignant neoplasm of bone: Secondary | ICD-10-CM | POA: Insufficient documentation

## 2023-12-05 ENCOUNTER — Other Ambulatory Visit: Payer: Self-pay | Admitting: Hematology

## 2023-12-05 DIAGNOSIS — G893 Neoplasm related pain (acute) (chronic): Secondary | ICD-10-CM

## 2023-12-05 DIAGNOSIS — F419 Anxiety disorder, unspecified: Secondary | ICD-10-CM

## 2023-12-05 DIAGNOSIS — C50911 Malignant neoplasm of unspecified site of right female breast: Secondary | ICD-10-CM

## 2023-12-07 ENCOUNTER — Encounter: Payer: Self-pay | Admitting: Hematology

## 2023-12-10 ENCOUNTER — Inpatient Hospital Stay: Payer: MEDICAID

## 2023-12-10 ENCOUNTER — Inpatient Hospital Stay (HOSPITAL_BASED_OUTPATIENT_CLINIC_OR_DEPARTMENT_OTHER): Payer: MEDICAID | Admitting: Hematology

## 2023-12-10 VITALS — BP 133/91 | HR 88 | Temp 99.1°F | Resp 18

## 2023-12-10 DIAGNOSIS — Z5112 Encounter for antineoplastic immunotherapy: Secondary | ICD-10-CM | POA: Diagnosis present

## 2023-12-10 DIAGNOSIS — C787 Secondary malignant neoplasm of liver and intrahepatic bile duct: Secondary | ICD-10-CM | POA: Diagnosis not present

## 2023-12-10 DIAGNOSIS — Z1731 Human epidermal growth factor receptor 2 positive status: Secondary | ICD-10-CM

## 2023-12-10 DIAGNOSIS — C7951 Secondary malignant neoplasm of bone: Secondary | ICD-10-CM | POA: Diagnosis not present

## 2023-12-10 DIAGNOSIS — C50111 Malignant neoplasm of central portion of right female breast: Secondary | ICD-10-CM | POA: Diagnosis not present

## 2023-12-10 DIAGNOSIS — Z87891 Personal history of nicotine dependence: Secondary | ICD-10-CM | POA: Diagnosis not present

## 2023-12-10 DIAGNOSIS — Z79899 Other long term (current) drug therapy: Secondary | ICD-10-CM

## 2023-12-10 DIAGNOSIS — C50911 Malignant neoplasm of unspecified site of right female breast: Secondary | ICD-10-CM | POA: Diagnosis not present

## 2023-12-10 DIAGNOSIS — C773 Secondary and unspecified malignant neoplasm of axilla and upper limb lymph nodes: Secondary | ICD-10-CM | POA: Diagnosis not present

## 2023-12-10 LAB — CBC WITH DIFFERENTIAL/PLATELET
Abs Immature Granulocytes: 0.03 10*3/uL (ref 0.00–0.07)
Basophils Absolute: 0.1 10*3/uL (ref 0.0–0.1)
Basophils Relative: 1 %
Eosinophils Absolute: 0.1 10*3/uL (ref 0.0–0.5)
Eosinophils Relative: 1 %
HCT: 40.3 % (ref 36.0–46.0)
Hemoglobin: 12.8 g/dL (ref 12.0–15.0)
Immature Granulocytes: 0 %
Lymphocytes Relative: 15 %
Lymphs Abs: 1.3 10*3/uL (ref 0.7–4.0)
MCH: 32.1 pg (ref 26.0–34.0)
MCHC: 31.8 g/dL (ref 30.0–36.0)
MCV: 101 fL — ABNORMAL HIGH (ref 80.0–100.0)
Monocytes Absolute: 0.7 10*3/uL (ref 0.1–1.0)
Monocytes Relative: 9 %
Neutro Abs: 6.1 10*3/uL (ref 1.7–7.7)
Neutrophils Relative %: 74 %
Platelets: 266 10*3/uL (ref 150–400)
RBC: 3.99 MIL/uL (ref 3.87–5.11)
RDW: 12.9 % (ref 11.5–15.5)
WBC: 8.2 10*3/uL (ref 4.0–10.5)
nRBC: 0 % (ref 0.0–0.2)

## 2023-12-10 LAB — RAPID URINE DRUG SCREEN, HOSP PERFORMED
Amphetamines: NOT DETECTED
Barbiturates: NOT DETECTED
Benzodiazepines: POSITIVE — AB
Cocaine: POSITIVE — AB
Opiates: NOT DETECTED
Tetrahydrocannabinol: NOT DETECTED

## 2023-12-10 LAB — COMPREHENSIVE METABOLIC PANEL WITH GFR
ALT: 19 U/L (ref 0–44)
AST: 28 U/L (ref 15–41)
Albumin: 3.8 g/dL (ref 3.5–5.0)
Alkaline Phosphatase: 66 U/L (ref 38–126)
Anion gap: 10 (ref 5–15)
BUN: 22 mg/dL — ABNORMAL HIGH (ref 6–20)
CO2: 24 mmol/L (ref 22–32)
Calcium: 9.7 mg/dL (ref 8.9–10.3)
Chloride: 104 mmol/L (ref 98–111)
Creatinine, Ser: 0.74 mg/dL (ref 0.44–1.00)
GFR, Estimated: >60 mL/min (ref >60)
Glucose, Bld: 112 mg/dL — ABNORMAL HIGH (ref 70–99)
Potassium: 3.6 mmol/L (ref 3.5–5.1)
Sodium: 138 mmol/L (ref 135–145)
Total Bilirubin: <0.2 mg/dL (ref 0.0–1.2)
Total Protein: 7.4 g/dL (ref 6.5–8.1)

## 2023-12-10 LAB — MAGNESIUM: Magnesium: 1.9 mg/dL (ref 1.7–2.4)

## 2023-12-10 MED ORDER — FAM-TRASTUZUMAB DERUXTECAN-NXKI CHEMO 100 MG IV SOLR
5.4000 mg/kg | Freq: Once | INTRAVENOUS | Status: AC
  Administered 2023-12-10: 300 mg via INTRAVENOUS
  Filled 2023-12-10: qty 15

## 2023-12-10 MED ORDER — HEPARIN SOD (PORK) LOCK FLUSH 100 UNIT/ML IV SOLN
500.0000 [IU] | Freq: Once | INTRAVENOUS | Status: AC | PRN
  Administered 2023-12-10: 500 [IU]

## 2023-12-10 MED ORDER — SODIUM CHLORIDE 0.9% FLUSH
10.0000 mL | INTRAVENOUS | Status: DC | PRN
  Administered 2023-12-10: 10 mL via INTRAVENOUS

## 2023-12-10 MED ORDER — SODIUM CHLORIDE 0.9 % IV SOLN
150.0000 mg | Freq: Once | INTRAVENOUS | Status: AC
  Administered 2023-12-10: 150 mg via INTRAVENOUS
  Filled 2023-12-10: qty 5

## 2023-12-10 MED ORDER — DEXTROSE 5 % IV SOLN: Freq: Once | INTRAVENOUS | Status: AC

## 2023-12-10 MED ORDER — ACETAMINOPHEN 325 MG PO TABS
650.0000 mg | ORAL_TABLET | Freq: Once | ORAL | Status: AC
  Administered 2023-12-10: 650 mg via ORAL
  Filled 2023-12-10: qty 2

## 2023-12-10 MED ORDER — CETIRIZINE HCL 10 MG/ML IV SOLN
10.0000 mg | Freq: Once | INTRAVENOUS | Status: AC
  Administered 2023-12-10: 10 mg via INTRAVENOUS
  Filled 2023-12-10: qty 1

## 2023-12-10 MED ORDER — SODIUM CHLORIDE 0.9% FLUSH
10.0000 mL | INTRAVENOUS | Status: DC | PRN
  Administered 2023-12-10: 10 mL

## 2023-12-10 MED ORDER — DEXAMETHASONE SODIUM PHOSPHATE 10 MG/ML IJ SOLN
10.0000 mg | Freq: Once | INTRAMUSCULAR | Status: AC
  Administered 2023-12-10: 10 mg via INTRAVENOUS
  Filled 2023-12-10: qty 1

## 2023-12-10 MED ORDER — PALONOSETRON HCL INJECTION 0.25 MG/5ML
0.2500 mg | Freq: Once | INTRAVENOUS | Status: AC
  Administered 2023-12-10: 0.25 mg via INTRAVENOUS
  Filled 2023-12-10: qty 5

## 2023-12-10 NOTE — Progress Notes (Signed)
 Patient okay for treatment today per Dr. Cheree Cords HR 115. Patient tolerated therapy with no complaints voiced. Side effects with management reviewed with understanding verbalized. Port site clean and dry with no bruising or swelling noted at site. Good blood return noted before and after administration of therapy. Band aid applied. Patient left in satisfactory condition with VSS and no s/s of distress noted.

## 2023-12-10 NOTE — Patient Instructions (Signed)
 [facility] [place] - A DEPT OF [facility]  Discharge Instructions: Thank you for choosing [facility] to provide your oncology and hematology care.  If you have a lab appointment with the Cancer Center - please note that after April 8th, 2024, all labs will be drawn in the cancer center.  You do not have to check in or register with the main entrance as you have in the past but will complete your check-in in the cancer center.  Wear comfortable clothing and clothing appropriate for easy access to any Portacath or PICC line.   We strive to give you quality time with your provider. You may need to reschedule your appointment if you arrive late (15 or more minutes).  Arriving late affects you and other patients whose appointments are after yours.  Also, if you miss three or more appointments without notifying the office, you may be dismissed from the clinic at the provider's discretion.      For prescription refill requests, have your pharmacy contact our office and allow 72 hours for refills to be completed.    Today you received the following chemotherapy and/or immunotherapy agents Enhertu, return as scheduled.   To help prevent nausea and vomiting after your treatment, we encourage you to take your nausea medication as directed.  BELOW ARE SYMPTOMS THAT SHOULD BE REPORTED IMMEDIATELY: *FEVER GREATER THAN 100.4 F (38 C) OR HIGHER *CHILLS OR SWEATING *NAUSEA AND VOMITING THAT IS NOT CONTROLLED WITH YOUR NAUSEA MEDICATION *UNUSUAL SHORTNESS OF BREATH *UNUSUAL BRUISING OR BLEEDING *URINARY PROBLEMS (pain or burning when urinating, or frequent urination) *BOWEL PROBLEMS (unusual diarrhea, constipation, pain near the anus) TENDERNESS IN MOUTH AND THROAT WITH OR WITHOUT PRESENCE OF ULCERS (sore throat, sores in mouth, or a toothache) UNUSUAL RASH, SWELLING OR PAIN  UNUSUAL VAGINAL DISCHARGE OR ITCHING   Items with * indicate a potential emergency and  should be followed up as soon as possible or go to the Emergency Department if any problems should occur.  Please show the CHEMOTHERAPY ALERT CARD or IMMUNOTHERAPY ALERT CARD at check-in to the Emergency Department and triage nurse.  Should you have questions after your visit or need to cancel or reschedule your appointment, please contact [facility] [place] - A DEPT OF [facility] [phone]  and follow the prompts.  Office hours are 8:00 a.m. to 4:30 p.m. Monday - Friday. Please note that voicemails left after 4:00 p.m. may not be returned until the following business day.  We are closed weekends and major holidays. You have access to a nurse at all times for urgent questions. Please call the main number to the clinic [phone] and follow the prompts.  For any non-urgent questions, you may also contact your provider using MyChart. We now offer e-Visits for anyone 51 and older to request care online for non-urgent symptoms. For details visit [email or web address].   Also download the MyChart app! Go to the app store, search "MyChart", open the app, select [place], and log in with your MyChart username and password.

## 2023-12-10 NOTE — Progress Notes (Signed)
 [name] [address], [place] [address]    Clinic Day:  12/10/2023  Referring physician: Veda Gerald, MD  Patient Care Team: Veda Gerald, MD as PCP - General (Internal Medicine) Paulett Boros, MD as Medical Oncologist (Medical Oncology) Gerhard Knuckles, RN as Oncology Nurse Navigator (Oncology)   ASSESSMENT & PLAN:   Assessment: 1. Her2+ metastatic breast cancer: - She reports feeling a knot in the right breast in July 2022.  She felt the same lump in September which has increased slightly in November.  In the last 3 months, it has increased in size from a quarter to size of an orange. - Mammogram/ultrasound on 10/01/2021: Large irregular mass involving the entire central right breast measuring 4.9 x 2.4 x 4 cm at 12:00.  At least 3 lymph nodes in the right axilla.  Overlying skin thickening and generalized erythema and marked tenderness. - Right breast mass and axillary lymph node biopsy on 10/08/2021: - Pathology: Poorly differentiated ductal carcinoma, grade 3, HER2 3+ positive, ER 5% strong staining intensity, PR negative, Ki-67 25%. - PET scan (11/30/2021): Hypermetabolic tumor involving right breast, metastatic mediastinal and hilar lymphadenopathy, hepatic metastatic disease, diffuse lytic bone lesions. - 4 cycles of THP from 12/03/2021 through 02/25/2022 - Cerebellar mass resection (11/12/2022): Metastatic carcinoma with tumor cells positive for CK7, GATA3, indicating breast primary.  ER and CK20 are negative.  HER2 is 3+. - Enhertu  started on 01/01/2023   2. Social/family history: - She is currently living with her father, youngest daughter and sister.  She has 2 daughters ages 71 and 2.  She has worked in Journalist, newspaper previously.  She quit smoking 3 months ago.  Prior to that she smoked a pack a day for 4 to 5 years and less than half pack a day since 2011.  She vapes occasionally at this time. - Paternal great grandmother had  lung cancer.  Paternal aunt had lung cancer at age 79.  Paternal grandmother had breast cancer in her 51s.  Paternal grandfather had colon cancer.  Maternal grandfather had colon cancer.    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  Continued improvement of the irregular nodularity and interlobular septal thickening.  No definitive evidence of osseous metastatic disease. - Her last treatment of Enhertu  was cycle 11 on 10/26/2023.  She reportedly went out of town and missed her appointments. - Labs today: Normal LFTs and creatinine.  CBC grossly normal.  She may proceed with her next cycle today.  RTC 3 weeks for follow-up.  2.  Difficulty falling asleep: - She will continue Remeron  15 mg at bedtime as needed.  3.  Anxiety: - She was taking Xanax  0.5 mg daily as needed for anxiety.  As her UDS was positive, we did not renew it today.  4.  Chest pain, back pain, right ankle pain: - She reports that she is stretching out pain medication as she missed her appointment.  Today her urine drug screen was positive for cocaine.  I have refused to give her pain medication and Xanax  today.   5.  High risk drug monitoring: - 2D echo on 08/03/2023 with LVEF 60 to 65%.  Does not report any PND or orthopnea.  Will repeat another echo.    Orders Placed This Encounter  Procedures   Magnesium    Standing Status:   Future  Expected Date:   12/31/2023    Expiration Date:   12/30/2024   CBC with Differential    Standing Status:   Future    Expected Date:   12/31/2023    Expiration Date:   12/30/2024   Comprehensive metabolic panel    Standing Status:   Future    Expected Date:   12/31/2023    Expiration Date:   12/30/2024   Magnesium    Standing Status:   Future    Expected Date:   01/21/2024    Expiration Date:   01/20/2025   CBC with Differential    Standing Status:   Future    Expected Date:   01/21/2024     Expiration Date:   01/20/2025   Comprehensive metabolic panel    Standing Status:   Future    Expected Date:   01/21/2024    Expiration Date:   01/20/2025   Magnesium    Standing Status:   Future    Expected Date:   02/11/2024    Expiration Date:   02/10/2025   CBC with Differential    Standing Status:   Future    Expected Date:   02/11/2024    Expiration Date:   02/10/2025   Comprehensive metabolic panel    Standing Status:   Future    Expected Date:   02/11/2024    Expiration Date:   02/10/2025   Magnesium    Standing Status:   Future    Expected Date:   03/03/2024    Expiration Date:   03/03/2025   CBC with Differential    Standing Status:   Future    Expected Date:   03/03/2024    Expiration Date:   03/03/2025   Comprehensive metabolic panel    Standing Status:   Future    Expected Date:   03/03/2024    Expiration Date:   03/03/2025   Ambulatory referral to Social Work    Referral Priority:   Routine    Referral Type:   Consultation    Referral Reason:   Specialty Services Required    Number of Visits Requested:   1      I,Helena R Teague,acting as a Neurosurgeon for Paulett Boros, MD.,have documented all relevant documentation on the behalf of Paulett Boros, MD,as directed by  Paulett Boros, MD while in the presence of Paulett Boros, MD.  I, Paulett Boros MD, have reviewed the above documentation for accuracy and completeness, and I agree with the above.     Paulett Boros, MD   4/24/20255:47 PM  CHIEF COMPLAINT:   Diagnosis: Her2+ metastatic right breast cancer    Cancer Staging  HER2-positive carcinoma of right breast [name]) Staging form: Breast, AJCC 8th Edition - Clinical stage from 10/28/2021: Stage IV (cT4d, cN1, cM1, G3, ER+, PR-, HER2+) - Unsigned    Prior Therapy: 1. DOCEtaxel  + Trastuzumab  + Pertuzumab  (THP) q21d x 4 cycles  2. SRS to brain metastases-- 10 fractions from 12/05/22 through 12/19/22, 1 fraction on 05/13/23  Current  Therapy:  Enhertu     HISTORY OF PRESENT ILLNESS:   Oncology History  HER2-positive carcinoma of right breast (HCC)  10/28/2021 Initial Diagnosis   Breast cancer, right breast (HCC)   12/03/2021 - 02/25/2022 Chemotherapy   Patient is on Treatment Plan : BREAST DOCEtaxel  + Trastuzumab  + Pertuzumab  (THP) q21d x 8 cycles / Trastuzumab  + Pertuzumab  q21d x 4 cycles     01/01/2023 -  Chemotherapy   Patient is on Treatment  Plan : BREAST METASTATIC Fam-Trastuzumab Deruxtecan-nxki  (Enhertu ) (5.4) q21d        INTERVAL HISTORY:   Sara [name] is a 48 y.o. female presenting to clinic today for follow up of Her2+ metastatic right breast cancer. She was last seen by me on 10/01/23.  Today, she states that she is doing well overall. Her appetite level is at 65%. Her energy level is at 65%. Her last infusion was on 10/26/23. She states she was on vacation and that her daughter had viral laryngitis, which prompted her to go to urgent care. She was negative for any infection, but reports she was diagnosed with hypertension. She has recently started clonidine due to hypertension from a previous prescription many years ago from Dr. Ambrosio Junker.   Chelcie notes chest pain and SOB, she mainly attributes to stress. She has been taking Xanax  as prescribed. She last took oxycodone , 0.5 of a pill this morning   PAST MEDICAL HISTORY:   Past Medical History: Past Medical History:  Diagnosis Date   Anxiety    Back pain    Cancer [facility])     Surgical History: Past Surgical History:  Procedure Laterality Date   CHOLECYSTECTOMY     ORTHOPEDIC SURGERY     PORTACATH PLACEMENT Left 11/18/2021   Procedure: INSERTION PORT-A-CATH;  Surgeon: Awilda Bogus, MD;  Location: [facility];  Service: General;  Laterality: Left;   SUBOCCIPITAL CRANIECTOMY CERVICAL LAMINECTOMY N/A 11/12/2022   Procedure: SUBOCCIPITAL CRANIECTOMY FOR RESECTION OF CEREBELLAR MASS;  Surgeon: Gearl Keens, MD;  Location[name];  Service: Neurosurgery;  Laterality:  N/A;    Social History: Social History   Socioeconomic History   Marital status: Legally Separated    Spouse name: Not on file   Number of children: Not on file   Years of education: Not on file   Highest education level: Not on file  Occupational History   Not on file  Tobacco Use   Smoking status: Former    Current packs/day: 0.00    Average packs/day: 1 pack/day for 5.0 years (5.0 ttl pk-yrs)    Types: Cigarettes    Start date: 11/08/2016    Quit date: 11/08/2021    Years since quitting: 2.0   Smokeless tobacco: Never  Vaping Use   Vaping status: Never Used  Substance and Sexual Activity   Alcohol use: No    Comment: occasionally   Drug use: No   Sexual activity: Yes    Birth control/protection: None  Other Topics Concern   Not on file  Social History Narrative   Not on file   Social Drivers of Health   Financial Resource Strain: High Risk (12/03/2021)   Overall Financial Resource Strain (CARDIA)    Difficulty of Paying Living Expenses: Very hard  Food Insecurity: No Food Insecurity (09/17/2023)   Hunger Vital Sign    Worried About Running Out of Food in the Last Year: Never true    Ran Out of Food in the Last Year: Never true  Transportation Needs: No Transportation Needs (11/25/2022)   PRAPARE - Administrator, Civil Service (Medical): No    Lack of Transportation (Non-Medical): No  Recent Concern: Transportation Needs - Unmet Transportation Needs (11/11/2022)   PRAPARE - Transportation    Lack of Transportation (Medical): Yes    Lack of Transportation (Non-Medical): Yes  Physical Activity: Sufficiently Active (06/17/2021)   Exercise Vital Sign    Days of Exercise per Week: 5 days    Minutes of Exercise per  Session: 30 min  Stress: Stress Concern Present (06/17/2021)   [employer] of Occupational Health - Occupational Stress Questionnaire    Feeling of Stress : Rather much  Social Connections: Socially Isolated (06/17/2021)   Social  Connection and Isolation Panel [NHANES]    Frequency of Communication with Friends and Family: Twice a week    Frequency of Social Gatherings with Friends and Family: Never    Attends Religious Services: 1 to 4 times per year    Active Member of [employer] or Organizations: No    Attends Banker Meetings: Never    Marital Status: Divorced  Catering manager Violence: Not At Risk (09/17/2023)   Humiliation, Afraid, Rape, and Kick questionnaire    Fear of Current or Ex-Partner: No    Emotionally Abused: No    Physically Abused: No    Sexually Abused: No    Family History: Family History  Problem Relation Age of Onset   Heart disease Brother    Lung cancer Paternal Grandmother        lung   Cancer Paternal Grandfather    Asthma Daughter    Asthma Daughter     Current Medications:  Current Outpatient Medications:    ALPRAZolam  (XANAX ) 0.5 MG tablet, TAKE 1 TABLET BY MOUTH AT BEDTIME AS NEEDED FOR ANXIETY, Disp: 30 tablet, Rfl: 0   docusate sodium  (COLACE) 100 MG capsule, Take 1 capsule (100 mg total) by mouth 2 (two) times daily., Disp: , Rfl:    furosemide  (LASIX ) 20 MG tablet, Take 1 tablet (20 mg total) by mouth daily as needed., Disp: 30 tablet, Rfl: 0   lidocaine -prilocaine  (EMLA ) cream, Apply 1 Application topically as needed., Disp: 30 g, Rfl: 0   mirtazapine  (REMERON ) 15 MG tablet, Take 1 tablet (15 mg total) by mouth at bedtime., Disp: 30 tablet, Rfl: 2   ondansetron  (ZOFRAN ) 8 MG tablet, TAKE 1 TABLET BY MOUTH EVERY 8 HOURS AS NEEDED FOR NAUSEA FOR VOMITING, Disp: 90 tablet, Rfl: 5   oxyCODONE -acetaminophen  (PERCOCET) 7.5-325 MG tablet, Take 1 tablet by mouth every 6 (six) hours as needed for severe pain (pain score 7-10)., Disp: 84 tablet, Rfl: 0   pantoprazole  (PROTONIX ) 20 MG tablet, Take 1 tablet (20 mg total) by mouth daily., Disp: 30 tablet, Rfl: 5   prochlorperazine  (COMPAZINE ) 10 MG tablet, Take 1 tablet (10 mg total) by mouth every 6 (six) hours as needed for  nausea or vomiting., Disp: 30 tablet, Rfl: 5 No current facility-administered medications for this visit.  Facility-Administered Medications Ordered in Other Visits:    sodium chloride  flush (NS) 0.9 % injection 10 mL, 10 mL, Intracatheter, PRN, Emrys Mckamie, MD, 10 mL at 12/10/23 1336   Allergies: Allergies  Allergen Reactions   Hydrocodone  Itching, Nausea And Vomiting, Rash and Other (See Comments)    Hallucinations when given vicodin post crani in 2024   Sulfa Antibiotics Nausea And Vomiting   Morphine  And Codeine Rash    REVIEW OF SYSTEMS:   Review of Systems  Constitutional:  Negative for chills, fatigue and fever.  HENT:   Negative for lump/mass, mouth sores, nosebleeds, sore throat and trouble swallowing.   Eyes:  Negative for eye problems.  Respiratory:  Positive for shortness of breath. Negative for cough.   Cardiovascular:  Positive for chest pain. Negative for leg swelling and palpitations.  Gastrointestinal:  Positive for constipation (occasional) and diarrhea (occasional). Negative for abdominal pain, nausea and vomiting.  Genitourinary:  Negative for bladder incontinence, difficulty urinating, dysuria, frequency,  hematuria and nocturia.   Musculoskeletal:  Negative for arthralgias, back pain, flank pain, myalgias and neck pain.       +pain throughout the body, 6-7/10 severity  Skin:  Negative for itching and rash.  Neurological:  Positive for dizziness (occasional). Negative for headaches and numbness.  Hematological:  Does not bruise/bleed easily.  Psychiatric/Behavioral:  Positive for depression and sleep disturbance. Negative for suicidal ideas. The patient is nervous/anxious.   All other systems reviewed and are negative.    VITALS:   There were no vitals taken for this visit.  Wt Readings from Last 3 Encounters:  12/10/23 126 lb 11.2 oz (57.5 kg)  10/26/23 124 lb 5.4 oz (56.4 kg)  10/05/23 123 lb (55.8 kg)    There is no height or weight on file to  calculate BMI.  Performance status (ECOG): 1 - Symptomatic but completely ambulatory  PHYSICAL EXAM:   Physical Exam Vitals and nursing note reviewed. Exam conducted with a chaperone present.  Constitutional:      Appearance: Normal appearance.  Cardiovascular:     Rate and Rhythm: Normal rate and regular rhythm.     Pulses: Normal pulses.     Heart sounds: Normal heart sounds.  Pulmonary:     Effort: Pulmonary effort is normal.     Breath sounds: Normal breath sounds.  Abdominal:     Palpations: Abdomen is soft. There is no hepatomegaly, splenomegaly or mass.     Tenderness: There is no abdominal tenderness.  Musculoskeletal:     Right lower leg: No edema.     Left lower leg: No edema.  Lymphadenopathy:     Cervical: No cervical adenopathy.     Right cervical: No superficial, deep or posterior cervical adenopathy.    Left cervical: No superficial, deep or posterior cervical adenopathy.     Upper Body:     Right upper body: No supraclavicular or axillary adenopathy.     Left upper body: No supraclavicular or axillary adenopathy.  Neurological:     General: No focal deficit present.     Mental Status: She is alert and oriented to person, place, and time.  Psychiatric:        Mood and Affect: Mood normal.        Behavior: Behavior normal.     LABS:      Latest Ref Rng & Units 12/10/2023    9:07 AM 10/26/2023    1:33 PM 10/01/2023   11:01 AM  CBC  WBC 4.0 - 10.5 K/uL 8.2  5.4  8.9   Hemoglobin 12.0 - 15.0 g/dL 16.1  09.6  04.5   Hematocrit 36.0 - 46.0 % 40.3  36.8  40.9   Platelets 150 - 400 K/uL 266  255  220       Latest Ref Rng & Units 12/10/2023    9:07 AM 10/26/2023    1:33 PM 10/01/2023   11:01 AM  CMP  Glucose 70 - 99 mg/dL 409  811  914   BUN 6 - 20 mg/dL 22  20  30    Creatinine 0.44 - 1.00 mg/dL 7.82  9.56  2.13   Sodium 135 - 145 mmol/L 138  140  138   Potassium 3.5 - 5.1 mmol/L 3.6  3.6  4.0   Chloride 98 - 111 mmol/L 104  102  101   CO2 22 - 32  mmol/L 24  26  26    Calcium 8.9 - 10.3 mg/dL 9.7  9.2  9.4  Total Protein 6.5 - 8.1 g/dL 7.4  7.0  7.4   Total Bilirubin 0.0 - 1.2 mg/dL <6.5  0.2  0.4   Alkaline Phos 38 - 126 U/L 66  64  59   AST 15 - 41 U/L 28  22  28    ALT 0 - 44 U/L 19  19  21       No results found for: "CEA1", "CEA" / No results found for: "CEA1", "CEA" No results found for: "PSA1" No results found for: "HQI696" No results found for: "CAN125"  No results found for: "TOTALPROTELP", "ALBUMINELP", "A1GS", "A2GS", "BETS", "BETA2SER", "GAMS", "MSPIKE", "SPEI" No results found for: "TIBC", "FERRITIN", "IRONPCTSAT" No results found for: "LDH"   STUDIES:   No results found.

## 2023-12-10 NOTE — Patient Instructions (Addendum)
 [facility]  Discharge Instructions  You were seen and examined today by Dr. Cheree Cords.  Dr. Cheree Cords discussed your most recent lab work which is stable.  You can proceed with your treatment today.  Follow-up as scheduled.  Thank you for choosing [facility] to provide your oncology and hematology care.   To afford each patient quality time with our provider, please arrive at least 15 minutes before your scheduled appointment time. You may need to reschedule your appointment if you arrive late (10 or more minutes). Arriving late affects you and other patients whose appointments are after yours.  Also, if you miss three or more appointments without notifying the office, you may be dismissed from the clinic at the provider's discretion.    Again, thank you for choosing [facility].  Our hope is that these requests will decrease the amount of time that you wait before being seen by our physicians.   If you have a lab appointment with the Cancer Center - please note that after April 8th, all labs will be drawn in the cancer center.  You do not have to check in or register with the main entrance as you have in the past but will complete your check-in at the cancer center.            _____________________________________________________________  Should you have questions after your visit to [facility], please contact our office at [phone] and follow the prompts.  Our office hours are 8:00 a.m. to 4:30 p.m. Monday - Thursday and 8:00 a.m. to 2:30 p.m. Friday.  Please note that voicemails left after 4:00 p.m. may not be returned until the following business day.  We are closed weekends and all major holidays.  You do have access to a nurse 24-7, just call the main number to the clinic [phone] and do not press any options, hold on the line and a nurse will answer the phone.    For prescription refill  requests, have your pharmacy contact our office and allow 72 hours.    Masks are no longer required in the cancer centers. If you would like for your care team to wear a mask while they are taking care of you, please let them know. You may have one support person who is at least 47 years old accompany you for your appointments.

## 2023-12-11 ENCOUNTER — Other Ambulatory Visit: Payer: Self-pay

## 2023-12-11 ENCOUNTER — Inpatient Hospital Stay: Payer: MEDICAID

## 2023-12-11 DIAGNOSIS — C50911 Malignant neoplasm of unspecified site of right female breast: Secondary | ICD-10-CM

## 2023-12-11 DIAGNOSIS — F1920 Other psychoactive substance dependence, uncomplicated: Secondary | ICD-10-CM

## 2023-12-11 DIAGNOSIS — Z5112 Encounter for antineoplastic immunotherapy: Secondary | ICD-10-CM | POA: Diagnosis not present

## 2023-12-11 LAB — RAPID URINE DRUG SCREEN, HOSP PERFORMED
Amphetamines: NOT DETECTED
Barbiturates: NOT DETECTED
Benzodiazepines: POSITIVE — AB
Cocaine: NOT DETECTED
Opiates: NOT DETECTED
Tetrahydrocannabinol: NOT DETECTED

## 2023-12-14 ENCOUNTER — Other Ambulatory Visit: Payer: Self-pay | Admitting: *Deleted

## 2023-12-14 DIAGNOSIS — G893 Neoplasm related pain (acute) (chronic): Secondary | ICD-10-CM

## 2023-12-14 DIAGNOSIS — F419 Anxiety disorder, unspecified: Secondary | ICD-10-CM

## 2023-12-14 DIAGNOSIS — Z1731 Human epidermal growth factor receptor 2 positive status: Secondary | ICD-10-CM

## 2023-12-14 MED ORDER — PANTOPRAZOLE SODIUM 20 MG PO TBEC: 20.0000 mg | DELAYED_RELEASE_TABLET | Freq: Every day | ORAL | 5 refills | Status: DC

## 2023-12-15 ENCOUNTER — Encounter: Payer: Self-pay | Admitting: *Deleted

## 2023-12-15 ENCOUNTER — Encounter: Payer: Self-pay | Admitting: Hematology

## 2023-12-15 MED ORDER — ALPRAZOLAM 0.5 MG PO TABS: ORAL_TABLET | ORAL | 0 refills | Status: DC

## 2023-12-15 MED ORDER — OXYCODONE-ACETAMINOPHEN 7.5-325 MG PO TABS: 1.0000 | ORAL_TABLET | Freq: Four times a day (QID) | ORAL | 0 refills | Status: DC | PRN

## 2023-12-16 ENCOUNTER — Inpatient Hospital Stay: Payer: MEDICAID | Admitting: Licensed Clinical Social Worker

## 2023-12-16 ENCOUNTER — Ambulatory Visit (HOSPITAL_COMMUNITY): Payer: MEDICAID | Attending: Radiation Oncology

## 2023-12-16 DIAGNOSIS — C50911 Malignant neoplasm of unspecified site of right female breast: Secondary | ICD-10-CM

## 2023-12-16 NOTE — Progress Notes (Signed)
 [facility] CSW Progress Note  Visual merchandiser  received a referral to check in on pt.  CSW attempted to reach pt by phone w/ no answer.  Voicemail left for pt requesting a call back.        Quenton Bruns, LCSW Clinical Social Worker [facility]

## 2023-12-23 ENCOUNTER — Ambulatory Visit: Payer: MEDICAID | Admitting: Urology

## 2023-12-29 ENCOUNTER — Ambulatory Visit (HOSPITAL_COMMUNITY): Admission: RE | Admit: 2023-12-29 | Payer: MEDICAID | Source: Ambulatory Visit

## 2023-12-30 ENCOUNTER — Telehealth: Payer: Self-pay | Admitting: Radiation Therapy

## 2023-12-30 NOTE — Telephone Encounter (Signed)
 I spoke with Sara [name] about rescheduling the missed scan. She does not want to continue MRI surveillance due to the difficulty getting to appointments in [place]. Even with transportation assistance from the [facility], she does not wish to reschedule.   Axel Bohr R.T(R)(T) Radiation Special Procedures Lead

## 2023-12-31 ENCOUNTER — Inpatient Hospital Stay: Payer: MEDICAID

## 2023-12-31 ENCOUNTER — Telehealth: Payer: Self-pay | Admitting: Radiation Therapy

## 2023-12-31 ENCOUNTER — Inpatient Hospital Stay: Payer: MEDICAID | Admitting: Hematology

## 2023-12-31 NOTE — Telephone Encounter (Signed)
 I called Ms. Roers to see if she is open to continuing her brain follow-up imaging at [facility] due to the difficulty she has getting to [place]. There was no answer, I left a detailed message including my contact information and a request for her to call me back.   Axel Bohr R.T(R)(T) Radiation Special Procedures Lead

## 2024-01-01 ENCOUNTER — Other Ambulatory Visit: Payer: Self-pay

## 2024-01-01 ENCOUNTER — Inpatient Hospital Stay: Payer: MEDICAID | Attending: Hematology

## 2024-01-04 ENCOUNTER — Inpatient Hospital Stay: Payer: MEDICAID | Admitting: Hematology

## 2024-01-04 ENCOUNTER — Telehealth: Payer: Self-pay | Admitting: Radiation Therapy

## 2024-01-04 ENCOUNTER — Inpatient Hospital Stay: Payer: MEDICAID

## 2024-01-04 NOTE — Telephone Encounter (Signed)
 Left another voicemail for patient requesting a call back to discuss having follow-up brain imaging continued at [facility].   Axel Bohr R.T.(R)(T) Radiation Special Procedures Lead

## 2024-01-07 ENCOUNTER — Inpatient Hospital Stay (HOSPITAL_BASED_OUTPATIENT_CLINIC_OR_DEPARTMENT_OTHER): Payer: MEDICAID | Attending: Hematology | Admitting: Hematology

## 2024-01-07 ENCOUNTER — Inpatient Hospital Stay: Payer: MEDICAID | Attending: Hematology

## 2024-01-07 ENCOUNTER — Inpatient Hospital Stay: Payer: MEDICAID

## 2024-01-07 ENCOUNTER — Other Ambulatory Visit: Payer: Self-pay | Admitting: *Deleted

## 2024-01-07 VITALS — BP 116/74 | HR 82 | Temp 97.9°F | Resp 18

## 2024-01-07 VITALS — BP 114/74

## 2024-01-07 DIAGNOSIS — Z1731 Human epidermal growth factor receptor 2 positive status: Secondary | ICD-10-CM

## 2024-01-07 DIAGNOSIS — G893 Neoplasm related pain (acute) (chronic): Secondary | ICD-10-CM

## 2024-01-07 DIAGNOSIS — Z170 Estrogen receptor positive status [ER+]: Secondary | ICD-10-CM | POA: Diagnosis not present

## 2024-01-07 DIAGNOSIS — Z79899 Other long term (current) drug therapy: Secondary | ICD-10-CM | POA: Diagnosis not present

## 2024-01-07 DIAGNOSIS — C7951 Secondary malignant neoplasm of bone: Secondary | ICD-10-CM | POA: Diagnosis not present

## 2024-01-07 DIAGNOSIS — Z5112 Encounter for antineoplastic immunotherapy: Secondary | ICD-10-CM | POA: Diagnosis present

## 2024-01-07 DIAGNOSIS — F419 Anxiety disorder, unspecified: Secondary | ICD-10-CM

## 2024-01-07 DIAGNOSIS — C50911 Malignant neoplasm of unspecified site of right female breast: Secondary | ICD-10-CM | POA: Diagnosis not present

## 2024-01-07 DIAGNOSIS — C787 Secondary malignant neoplasm of liver and intrahepatic bile duct: Secondary | ICD-10-CM | POA: Diagnosis not present

## 2024-01-07 DIAGNOSIS — C7931 Secondary malignant neoplasm of brain: Secondary | ICD-10-CM | POA: Diagnosis not present

## 2024-01-07 LAB — CBC WITH DIFFERENTIAL/PLATELET
Abs Immature Granulocytes: 0.02 10*3/uL (ref 0.00–0.07)
Basophils Absolute: 0.1 10*3/uL (ref 0.0–0.1)
Basophils Relative: 1 %
Eosinophils Absolute: 0.3 10*3/uL (ref 0.0–0.5)
Eosinophils Relative: 4 %
HCT: 35.8 % — ABNORMAL LOW (ref 36.0–46.0)
Hemoglobin: 11.5 g/dL — ABNORMAL LOW (ref 12.0–15.0)
Immature Granulocytes: 0 %
Lymphocytes Relative: 24 %
Lymphs Abs: 1.6 10*3/uL (ref 0.7–4.0)
MCH: 32.4 pg (ref 26.0–34.0)
MCHC: 32.1 g/dL (ref 30.0–36.0)
MCV: 100.8 fL — ABNORMAL HIGH (ref 80.0–100.0)
Monocytes Absolute: 0.8 10*3/uL (ref 0.1–1.0)
Monocytes Relative: 12 %
Neutro Abs: 4.1 10*3/uL (ref 1.7–7.7)
Neutrophils Relative %: 59 %
Platelets: 229 10*3/uL (ref 150–400)
RBC: 3.55 MIL/uL — ABNORMAL LOW (ref 3.87–5.11)
RDW: 13.2 % (ref 11.5–15.5)
WBC: 6.9 10*3/uL (ref 4.0–10.5)
nRBC: 0 % (ref 0.0–0.2)

## 2024-01-07 LAB — COMPREHENSIVE METABOLIC PANEL WITH GFR
ALT: 14 U/L (ref 0–44)
AST: 20 U/L (ref 15–41)
Albumin: 3.5 g/dL (ref 3.5–5.0)
Alkaline Phosphatase: 72 U/L (ref 38–126)
Anion gap: 8 (ref 5–15)
BUN: 23 mg/dL — ABNORMAL HIGH (ref 6–20)
CO2: 25 mmol/L (ref 22–32)
Calcium: 9.1 mg/dL (ref 8.9–10.3)
Chloride: 104 mmol/L (ref 98–111)
Creatinine, Ser: 0.72 mg/dL (ref 0.44–1.00)
GFR, Estimated: >60 mL/min (ref >60)
Glucose, Bld: 93 mg/dL (ref 70–99)
Potassium: 3.9 mmol/L (ref 3.5–5.1)
Sodium: 137 mmol/L (ref 135–145)
Total Bilirubin: 0.4 mg/dL (ref 0.0–1.2)
Total Protein: 6.6 g/dL (ref 6.5–8.1)

## 2024-01-07 LAB — MAGNESIUM: Magnesium: 1.8 mg/dL (ref 1.7–2.4)

## 2024-01-07 LAB — RAPID URINE DRUG SCREEN, HOSP PERFORMED
Amphetamines: NOT DETECTED
Barbiturates: NOT DETECTED
Benzodiazepines: POSITIVE — AB
Cocaine: NOT DETECTED
Opiates: NOT DETECTED
Tetrahydrocannabinol: NOT DETECTED

## 2024-01-07 LAB — PREGNANCY, URINE: Preg Test, Ur: NEGATIVE

## 2024-01-07 MED ORDER — FAM-TRASTUZUMAB DERUXTECAN-NXKI CHEMO 100 MG IV SOLR
5.4000 mg/kg | Freq: Once | INTRAVENOUS | Status: AC
  Administered 2024-01-07: 300 mg via INTRAVENOUS
  Filled 2024-01-07: qty 15

## 2024-01-07 MED ORDER — DEXTROSE 5 % IV SOLN
Freq: Once | INTRAVENOUS | Status: AC
Start: 2024-01-07 — End: 2024-01-07

## 2024-01-07 MED ORDER — ALPRAZOLAM 0.5 MG PO TABS: ORAL_TABLET | ORAL | 0 refills | Status: DC

## 2024-01-07 MED ORDER — FAMOTIDINE IN NACL 20-0.9 MG/50ML-% IV SOLN
20.0000 mg | Freq: Once | INTRAVENOUS | Status: AC
Start: 2024-01-07 — End: 2024-01-07
  Administered 2024-01-07: 20 mg via INTRAVENOUS
  Filled 2024-01-07: qty 50

## 2024-01-07 MED ORDER — HEPARIN SOD (PORK) LOCK FLUSH 100 UNIT/ML IV SOLN
500.0000 [IU] | Freq: Once | INTRAVENOUS | Status: AC | PRN
  Administered 2024-01-07: 500 [IU]

## 2024-01-07 MED ORDER — SODIUM CHLORIDE 0.9 % IV SOLN
150.0000 mg | Freq: Once | INTRAVENOUS | Status: AC
  Administered 2024-01-07: 150 mg via INTRAVENOUS
  Filled 2024-01-07: qty 150

## 2024-01-07 MED ORDER — PALONOSETRON HCL INJECTION 0.25 MG/5ML
0.2500 mg | Freq: Once | INTRAVENOUS | Status: AC
  Administered 2024-01-07: 0.25 mg via INTRAVENOUS
  Filled 2024-01-07: qty 5

## 2024-01-07 MED ORDER — ACETAMINOPHEN 325 MG PO TABS
650.0000 mg | ORAL_TABLET | Freq: Once | ORAL | Status: AC
  Administered 2024-01-07: 650 mg via ORAL
  Filled 2024-01-07: qty 2

## 2024-01-07 MED ORDER — PANTOPRAZOLE SODIUM 20 MG PO TBEC: 20.0000 mg | DELAYED_RELEASE_TABLET | Freq: Every day | ORAL | 5 refills | Status: AC

## 2024-01-07 MED ORDER — SODIUM CHLORIDE 0.9% FLUSH
10.0000 mL | INTRAVENOUS | Status: DC | PRN
  Administered 2024-01-07: 10 mL

## 2024-01-07 MED ORDER — DEXAMETHASONE SODIUM PHOSPHATE 10 MG/ML IJ SOLN
10.0000 mg | Freq: Once | INTRAMUSCULAR | Status: AC
  Administered 2024-01-07: 10 mg via INTRAVENOUS
  Filled 2024-01-07: qty 1

## 2024-01-07 MED ORDER — OXYCODONE-ACETAMINOPHEN 7.5-325 MG PO TABS
1.0000 | ORAL_TABLET | Freq: Four times a day (QID) | ORAL | 0 refills | Status: DC | PRN
Start: 2024-01-07 — End: 2024-01-28

## 2024-01-07 MED ORDER — SODIUM CHLORIDE 0.9% FLUSH
10.0000 mL | Freq: Once | INTRAVENOUS | Status: AC
  Administered 2024-01-07: 10 mL via INTRAVENOUS

## 2024-01-07 NOTE — Patient Instructions (Signed)

## 2024-01-07 NOTE — Progress Notes (Signed)
 Patient okay for treatment today per Dr. Cheree Cords, HR 114, Echo scheduled for June 11th.  Patient complains of leg pain from benadryl  and zyrtec , will switch to Pepcid.Patient tolerated chemotherapy with no complaints voiced. Side effects with management reviewed understanding verbalized. Port site clean and dry with no bruising or swelling noted at site. Good blood return noted before and after administration of chemotherapy. Band aid applied. Patient left in satisfactory condition with VSS and no s/s of distress noted.

## 2024-01-07 NOTE — Patient Instructions (Signed)
 [facility] [place] - A DEPT OF [facility]  Discharge Instructions: Thank you for choosing [facility] to provide your oncology and hematology care.  If you have a lab appointment with the Cancer Center - please note that after April 8th, 2024, all labs will be drawn in the cancer center.  You do not have to check in or register with the main entrance as you have in the past but will complete your check-in in the cancer center.  Wear comfortable clothing and clothing appropriate for easy access to any Portacath or PICC line.   We strive to give you quality time with your provider. You may need to reschedule your appointment if you arrive late (15 or more minutes).  Arriving late affects you and other patients whose appointments are after yours.  Also, if you miss three or more appointments without notifying the office, you may be dismissed from the clinic at the provider's discretion.      For prescription refill requests, have your pharmacy contact our office and allow 72 hours for refills to be completed.    Today you received the following chemotherapy and/or immunotherapy agents Enhertu, return as scheduled.   To help prevent nausea and vomiting after your treatment, we encourage you to take your nausea medication as directed.  BELOW ARE SYMPTOMS THAT SHOULD BE REPORTED IMMEDIATELY: *FEVER GREATER THAN 100.4 F (38 C) OR HIGHER *CHILLS OR SWEATING *NAUSEA AND VOMITING THAT IS NOT CONTROLLED WITH YOUR NAUSEA MEDICATION *UNUSUAL SHORTNESS OF BREATH *UNUSUAL BRUISING OR BLEEDING *URINARY PROBLEMS (pain or burning when urinating, or frequent urination) *BOWEL PROBLEMS (unusual diarrhea, constipation, pain near the anus) TENDERNESS IN MOUTH AND THROAT WITH OR WITHOUT PRESENCE OF ULCERS (sore throat, sores in mouth, or a toothache) UNUSUAL RASH, SWELLING OR PAIN  UNUSUAL VAGINAL DISCHARGE OR ITCHING   Items with * indicate a potential emergency and  should be followed up as soon as possible or go to the Emergency Department if any problems should occur.  Please show the CHEMOTHERAPY ALERT CARD or IMMUNOTHERAPY ALERT CARD at check-in to the Emergency Department and triage nurse.  Should you have questions after your visit or need to cancel or reschedule your appointment, please contact [facility] [place] - A DEPT OF [facility] [phone]  and follow the prompts.  Office hours are 8:00 a.m. to 4:30 p.m. Monday - Friday. Please note that voicemails left after 4:00 p.m. may not be returned until the following business day.  We are closed weekends and major holidays. You have access to a nurse at all times for urgent questions. Please call the main number to the clinic [phone] and follow the prompts.  For any non-urgent questions, you may also contact your provider using MyChart. We now offer e-Visits for anyone 51 and older to request care online for non-urgent symptoms. For details visit [email or web address].   Also download the MyChart app! Go to the app store, search "MyChart", open the app, select [place], and log in with your MyChart username and password.

## 2024-01-07 NOTE — Progress Notes (Signed)
 [facility] [address], [place] [address]    Clinic Day:  01/07/2024  Referring physician: Veda Gerald, MD  Patient Care Team: Veda Gerald, MD as PCP - General (Internal Medicine) Paulett Boros, MD as Medical Oncologist (Medical Oncology) Gerhard Knuckles, RN as Oncology Nurse Navigator (Oncology)   ASSESSMENT & PLAN:   Assessment: 1. Her2+ metastatic breast cancer: - She reports feeling a knot in the right breast in July 2022.  She felt the same lump in September which has increased slightly in November.  In the last 3 months, it has increased in size from a quarter to size of an orange. - Mammogram/ultrasound on 10/01/2021: Large irregular mass involving the entire central right breast measuring 4.9 x 2.4 x 4 cm at 12:00.  At least 3 lymph nodes in the right axilla.  Overlying skin thickening and generalized erythema and marked tenderness. - Right breast mass and axillary lymph node biopsy on 10/08/2021: - Pathology: Poorly differentiated ductal carcinoma, grade 3, HER2 3+ positive, ER 5% strong staining intensity, PR negative, Ki-67 25%. - PET scan (11/30/2021): Hypermetabolic tumor involving right breast, metastatic mediastinal and hilar lymphadenopathy, hepatic metastatic disease, diffuse lytic bone lesions. - 4 cycles of THP from 12/03/2021 through 02/25/2022 - Cerebellar mass resection (11/12/2022): Metastatic carcinoma with tumor cells positive for CK7, GATA3, indicating breast primary.  ER and CK20 are negative.  HER2 is 3+. - Enhertu  started on 01/01/2023   2. Social/family history: - She is currently living with her father, youngest daughter and sister.  She has 2 daughters ages 50 and 72.  She has worked in Journalist, newspaper previously.  She quit smoking 3 months ago.  Prior to that she smoked a pack a day for 4 to 5 years and less than half pack a day since 2011.  She vapes occasionally at this time. - Paternal great grandmother had  lung cancer.  Paternal aunt had lung cancer at age 60.  Paternal grandmother had breast cancer in her 58s.  Paternal grandfather had colon cancer.  Maternal grandfather had colon cancer.    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  Continued improvement of the irregular nodularity and interlobular septal thickening.  No definitive evidence of osseous metastatic disease. - She is tolerating Enhertu  reasonably well. - She reported on and off chest pain which she had for many years on the left side and center of the chest.  It has been nonstop for the last 3 months.  She reports that belching helps with the pain.  She is on Protonix . - On examination: There is tenderness in the midsternal area as well as left chest wall ribs.  Musculoskeletal pain more likely. - She may proceed with treatment today.  RTC 3 weeks for follow-up.  Will obtain 2D echocardiogram and CT CAP prior to next visit.  She is also planning to get MRI of the brain rescheduled in [place].  2.  Difficulty falling asleep: - Continue Remeron  15 mg at bedtime as needed.  3.  Anxiety: - Continue Xanax  0.5 mg daily as needed.  4.  Chest pain, back pain, right ankle pain: - Continue Percocet 7.5 mg twice daily as needed.   5.  High risk drug monitoring: - 2D echo on 08/03/2023 with LVEF 60-65%.  Denies any PND or orthopnea.  Will obtain the 2D echo gram prior to  next visit.    Orders Placed This Encounter  Procedures   CT CHEST ABDOMEN PELVIS W CONTRAST    Standing Status:   Future    Expected Date:   01/21/2024    Expiration Date:   01/06/2025    If indicated for the ordered procedure, I authorize the administration of contrast media per Radiology protocol:   Yes    Does the patient have a contrast media/X-ray dye allergy?:   No    Preferred imaging location?:   [facility]    If indicated for the ordered procedure, I  authorize the administration of oral contrast media per Radiology protocol:   Yes   ECHOCARDIOGRAM COMPLETE    Standing Status:   Future    Expected Date:   01/21/2024    Expiration Date:   01/06/2025    Where should this test be performed:   [facility]    Perflutren DEFINITY (image enhancing agent) should be administered unless hypersensitivity or allergy exist:   Administer Perflutren    Reason for exam-Echo:   Chemo  Z09      I,Helena R Teague,acting as a scribe for Paulett Boros, MD.,have documented all relevant documentation on the behalf of Paulett Boros, MD,as directed by  Paulett Boros, MD while in the presence of Paulett Boros, MD.  I, Paulett Boros MD, have reviewed the above documentation for accuracy and completeness, and I agree with the above.      Paulett Boros, MD   5/22/20254:05 PM  CHIEF COMPLAINT:   Diagnosis: Her2+ metastatic right breast cancer    Cancer Staging  HER2-positive carcinoma of right breast [facility]) Staging form: Breast, AJCC 8th Edition - Clinical stage from 10/28/2021: Stage IV (cT4d, cN1, cM1, G3, ER+, PR-, HER2+) - Unsigned    Prior Therapy: 1. DOCEtaxel  + Trastuzumab  + Pertuzumab  (THP) q21d x 4 cycles  2. SRS to brain metastases-- 10 fractions from 12/05/22 through 12/19/22, 1 fraction on 05/13/23  Current Therapy:  Enhertu     HISTORY OF PRESENT ILLNESS:   Oncology History  HER2-positive carcinoma of right breast (HCC)  10/28/2021 Initial Diagnosis   Breast cancer, right breast (HCC)   12/03/2021 - 02/25/2022 Chemotherapy   Patient is on Treatment Plan : BREAST DOCEtaxel  + Trastuzumab  + Pertuzumab  (THP) q21d x 8 cycles / Trastuzumab  + Pertuzumab  q21d x 4 cycles     01/01/2023 -  Chemotherapy   Patient is on Treatment Plan : BREAST METASTATIC Fam-Trastuzumab Deruxtecan-nxki  (Enhertu ) (5.4) q21d        INTERVAL HISTORY:   Sara [name] is a 48 y.o. female presenting to clinic today for follow up of Her2+  metastatic right breast cancer. She was last seen by me on 12/10/23.  Today, she states that she is doing well overall. Her appetite level is at 100%. Her energy level is at 100%.   PAST MEDICAL HISTORY:   Past Medical History: Past Medical History:  Diagnosis Date   Anxiety    Back pain    Cancer [facility])     Surgical History: Past Surgical History:  Procedure Laterality Date   CHOLECYSTECTOMY     ORTHOPEDIC SURGERY     PORTACATH PLACEMENT Left 11/18/2021   Procedure: INSERTION PORT-A-CATH;  Surgeon: Awilda Bogus, MD;  Location: [facility];  Service: General;  Laterality: Left;   SUBOCCIPITAL CRANIECTOMY CERVICAL LAMINECTOMY N/A 11/12/2022   Procedure: SUBOCCIPITAL CRANIECTOMY FOR RESECTION OF CEREBELLAR MASS;  Surgeon: Gearl Keens, MD;  Location: [facility] OR;  Service: Neurosurgery;  Laterality: N/A;  Social History: Social History   Socioeconomic History   Marital status: Legally Separated    Spouse name: Not on file   Number of children: Not on file   Years of education: Not on file   Highest education level: Not on file  Occupational History   Not on file  Tobacco Use   Smoking status: Former    Current packs/day: 0.00    Average packs/day: 1 pack/day for 5.0 years (5.0 ttl pk-yrs)    Types: Cigarettes    Start date: 11/08/2016    Quit date: 11/08/2021    Years since quitting: 2.1   Smokeless tobacco: Never  Vaping Use   Vaping status: Never Used  Substance and Sexual Activity   Alcohol use: No    Comment: occasionally   Drug use: No   Sexual activity: Yes    Birth control/protection: None  Other Topics Concern   Not on file  Social History Narrative   Not on file   Social Drivers of Health   Financial Resource Strain: High Risk (12/03/2021)   Overall Financial Resource Strain (CARDIA)    Difficulty of Paying Living Expenses: Very hard  Food Insecurity: No Food Insecurity (09/17/2023)   Hunger Vital Sign    Worried About Running Out of Food in the Last Year:  Never true    Ran Out of Food in the Last Year: Never true  Transportation Needs: No Transportation Needs (11/25/2022)   PRAPARE - Administrator, Civil Service (Medical): No    Lack of Transportation (Non-Medical): No  Recent Concern: Transportation Needs - Unmet Transportation Needs (11/11/2022)   PRAPARE - Transportation    Lack of Transportation (Medical): Yes    Lack of Transportation (Non-Medical): Yes  Physical Activity: Sufficiently Active (06/17/2021)   Exercise Vital Sign    Days of Exercise per Week: 5 days    Minutes of Exercise per Session: 30 min  Stress: Stress Concern Present (06/17/2021)   [employer] of Occupational Health - Occupational Stress Questionnaire    Feeling of Stress : Rather much  Social Connections: Socially Isolated (06/17/2021)   Social Connection and Isolation Panel [NHANES]    Frequency of Communication with Friends and Family: Twice a week    Frequency of Social Gatherings with Friends and Family: Never    Attends Religious Services: 1 to 4 times per year    Active Member of [employer] or Organizations: No    Attends Banker Meetings: Never    Marital Status: Divorced  Catering manager Violence: Not At Risk (09/17/2023)   Humiliation, Afraid, Rape, and Kick questionnaire    Fear of Current or Ex-Partner: No    Emotionally Abused: No    Physically Abused: No    Sexually Abused: No    Family History: Family History  Problem Relation Age of Onset   Heart disease Brother    Lung cancer Paternal Grandmother        lung   Cancer Paternal Grandfather    Asthma Daughter    Asthma Daughter     Current Medications:  Current Outpatient Medications:    docusate sodium  (COLACE) 100 MG capsule, Take 1 capsule (100 mg total) by mouth 2 (two) times daily., Disp: , Rfl:    furosemide  (LASIX ) 20 MG tablet, Take 1 tablet (20 mg total) by mouth daily as needed., Disp: 30 tablet, Rfl: 0   lidocaine -prilocaine  (EMLA ) cream, Apply 1  Application topically as needed., Disp: 30 g, Rfl: 0  mirtazapine  (REMERON ) 15 MG tablet, Take 1 tablet (15 mg total) by mouth at bedtime., Disp: 30 tablet, Rfl: 2   ALPRAZolam  (XANAX ) 0.5 MG tablet, TAKE 1 TABLET BY MOUTH AT BEDTIME AS NEEDED FOR ANXIETY, Disp: 30 tablet, Rfl: 0   ondansetron  (ZOFRAN ) 8 MG tablet, TAKE 1 TABLET BY MOUTH EVERY 8 HOURS AS NEEDED FOR NAUSEA FOR VOMITING (Patient not taking: Reported on 01/07/2024), Disp: 90 tablet, Rfl: 5   oxyCODONE -acetaminophen  (PERCOCET) 7.5-325 MG tablet, Take 1 tablet by mouth every 6 (six) hours as needed for severe pain (pain score 7-10)., Disp: 84 tablet, Rfl: 0   pantoprazole  (PROTONIX ) 20 MG tablet, Take 1 tablet (20 mg total) by mouth daily., Disp: 30 tablet, Rfl: 5   prochlorperazine  (COMPAZINE ) 10 MG tablet, Take 1 tablet (10 mg total) by mouth every 6 (six) hours as needed for nausea or vomiting. (Patient not taking: Reported on 01/07/2024), Disp: 30 tablet, Rfl: 5 No current facility-administered medications for this visit.  Facility-Administered Medications Ordered in Other Visits:    sodium chloride  flush (NS) 0.9 % injection 10 mL, 10 mL, Intracatheter, PRN, Azalee Weimer, MD, 10 mL at 01/07/24 1424   Allergies: Allergies  Allergen Reactions   Hydrocodone  Itching, Nausea And Vomiting, Rash and Other (See Comments)    Hallucinations when given vicodin post crani in 2024   Sulfa Antibiotics Nausea And Vomiting   Morphine  And Codeine Rash    REVIEW OF SYSTEMS:   Review of Systems  Constitutional:  Negative for chills, fatigue and fever.  HENT:   Negative for lump/mass, mouth sores, nosebleeds, sore throat and trouble swallowing.   Eyes:  Negative for eye problems.  Respiratory:  Negative for cough and shortness of breath.   Cardiovascular:  Positive for chest pain and palpitations. Negative for leg swelling.  Gastrointestinal:  Positive for diarrhea and nausea. Negative for abdominal pain, constipation and vomiting.   Genitourinary:  Negative for bladder incontinence, difficulty urinating, dysuria, frequency, hematuria and nocturia.   Musculoskeletal:  Negative for arthralgias, back pain, flank pain, myalgias and neck pain.  Skin:  Negative for itching and rash.  Neurological:  Positive for dizziness and headaches. Negative for numbness.  Hematological:  Does not bruise/bleed easily.  Psychiatric/Behavioral:  Positive for depression and sleep disturbance. Negative for suicidal ideas. The patient is nervous/anxious.   All other systems reviewed and are negative.    VITALS:   Blood pressure 114/74.  Wt Readings from Last 3 Encounters:  01/07/24 132 lb 11.5 oz (60.2 kg)  12/10/23 126 lb 11.2 oz (57.5 kg)  10/26/23 124 lb 5.4 oz (56.4 kg)    There is no height or weight on file to calculate BMI.  Performance status (ECOG): 1 - Symptomatic but completely ambulatory  PHYSICAL EXAM:   Physical Exam Vitals and nursing note reviewed. Exam conducted with a chaperone present.  Constitutional:      Appearance: Normal appearance.  Cardiovascular:     Rate and Rhythm: Normal rate and regular rhythm.     Pulses: Normal pulses.     Heart sounds: Normal heart sounds.  Pulmonary:     Effort: Pulmonary effort is normal.     Breath sounds: Normal breath sounds.  Abdominal:     Palpations: Abdomen is soft. There is no hepatomegaly, splenomegaly or mass.     Tenderness: There is no abdominal tenderness.  Musculoskeletal:     Right lower leg: No edema.     Left lower leg: No edema.  Lymphadenopathy:  Cervical: No cervical adenopathy.     Right cervical: No superficial, deep or posterior cervical adenopathy.    Left cervical: No superficial, deep or posterior cervical adenopathy.     Upper Body:     Right upper body: No supraclavicular or axillary adenopathy.     Left upper body: No supraclavicular or axillary adenopathy.  Neurological:     General: No focal deficit present.     Mental Status: She is  alert and oriented to person, place, and time.  Psychiatric:        Mood and Affect: Mood normal.        Behavior: Behavior normal.     LABS:      Latest Ref Rng & Units 01/07/2024   10:12 AM 12/10/2023    9:07 AM 10/26/2023    1:33 PM  CBC  WBC 4.0 - 10.5 K/uL 6.9  8.2  5.4   Hemoglobin 12.0 - 15.0 g/dL 78.2  95.6  21.3   Hematocrit 36.0 - 46.0 % 35.8  40.3  36.8   Platelets 150 - 400 K/uL 229  266  255       Latest Ref Rng & Units 01/07/2024   10:12 AM 12/10/2023    9:07 AM 10/26/2023    1:33 PM  CMP  Glucose 70 - 99 mg/dL 93  086  578   BUN 6 - 20 mg/dL 23  22  20    Creatinine 0.44 - 1.00 mg/dL 4.69  6.29  5.28   Sodium 135 - 145 mmol/L 137  138  140   Potassium 3.5 - 5.1 mmol/L 3.9  3.6  3.6   Chloride 98 - 111 mmol/L 104  104  102   CO2 22 - 32 mmol/L 25  24  26    Calcium 8.9 - 10.3 mg/dL 9.1  9.7  9.2   Total Protein 6.5 - 8.1 g/dL 6.6  7.4  7.0   Total Bilirubin 0.0 - 1.2 mg/dL 0.4  <4.1  0.2   Alkaline Phos 38 - 126 U/L 72  66  64   AST 15 - 41 U/L 20  28  22    ALT 0 - 44 U/L 14  19  19       No results found for: "CEA1", "CEA" / No results found for: "CEA1", "CEA" No results found for: "PSA1" No results found for: "LKG401" No results found for: "CAN125"  No results found for: "TOTALPROTELP", "ALBUMINELP", "A1GS", "A2GS", "BETS", "BETA2SER", "GAMS", "MSPIKE", "SPEI" No results found for: "TIBC", "FERRITIN", "IRONPCTSAT" No results found for: "LDH"   STUDIES:   No results found.

## 2024-01-08 ENCOUNTER — Telehealth: Payer: Self-pay | Admitting: Radiation Therapy

## 2024-01-08 LAB — CANCER ANTIGEN 27.29: CA 27.29: 13 U/mL (ref 0.0–38.6)

## 2024-01-08 LAB — CANCER ANTIGEN 15-3: CA 15-3: 12.8 U/mL (ref 0.0–25.0)

## 2024-01-08 NOTE — Telephone Encounter (Signed)
 I called to discuss rescheduling her brain MRI here in [place] as mentioned in Dr. Milburn Aliment office note from 5/22. No answer, I left a detailed message including the number to central scheduling to get that scan set up and my number in case she would prefer I do this for her.   Axel Bohr R.T(R)(T) Radiation Special Procedures Lead

## 2024-01-12 ENCOUNTER — Telehealth: Payer: Self-pay | Admitting: Radiation Therapy

## 2024-01-12 NOTE — Telephone Encounter (Signed)
 Left message for patient requesting a return call.   Axel Bohr R.T(R)(T) Radiation Special Procedures Lead

## 2024-01-13 ENCOUNTER — Ambulatory Visit (HOSPITAL_COMMUNITY): Admission: RE | Admit: 2024-01-13 | Payer: MEDICAID | Source: Ambulatory Visit

## 2024-01-21 ENCOUNTER — Ambulatory Visit (HOSPITAL_COMMUNITY): Payer: MEDICAID

## 2024-01-21 ENCOUNTER — Inpatient Hospital Stay: Payer: MEDICAID | Admitting: Hematology

## 2024-01-21 ENCOUNTER — Inpatient Hospital Stay: Payer: MEDICAID

## 2024-01-27 ENCOUNTER — Other Ambulatory Visit: Payer: MEDICAID

## 2024-01-28 ENCOUNTER — Telehealth: Payer: Self-pay | Admitting: Radiation Therapy

## 2024-01-28 ENCOUNTER — Inpatient Hospital Stay (HOSPITAL_BASED_OUTPATIENT_CLINIC_OR_DEPARTMENT_OTHER): Payer: MEDICAID | Admitting: Hematology

## 2024-01-28 ENCOUNTER — Other Ambulatory Visit: Payer: Self-pay | Admitting: *Deleted

## 2024-01-28 ENCOUNTER — Encounter: Payer: Self-pay | Admitting: Hematology

## 2024-01-28 ENCOUNTER — Inpatient Hospital Stay: Payer: MEDICAID

## 2024-01-28 VITALS — BP 123/86 | HR 75 | Temp 98.2°F | Resp 18 | Wt 132.0 lb

## 2024-01-28 DIAGNOSIS — Z170 Estrogen receptor positive status [ER+]: Secondary | ICD-10-CM | POA: Diagnosis not present

## 2024-01-28 DIAGNOSIS — Z1731 Human epidermal growth factor receptor 2 positive status: Secondary | ICD-10-CM | POA: Diagnosis not present

## 2024-01-28 DIAGNOSIS — C50911 Malignant neoplasm of unspecified site of right female breast: Secondary | ICD-10-CM

## 2024-01-28 DIAGNOSIS — C7931 Secondary malignant neoplasm of brain: Secondary | ICD-10-CM | POA: Diagnosis not present

## 2024-01-28 DIAGNOSIS — Z5112 Encounter for antineoplastic immunotherapy: Secondary | ICD-10-CM | POA: Diagnosis present

## 2024-01-28 DIAGNOSIS — C7951 Secondary malignant neoplasm of bone: Secondary | ICD-10-CM | POA: Diagnosis not present

## 2024-01-28 DIAGNOSIS — C787 Secondary malignant neoplasm of liver and intrahepatic bile duct: Secondary | ICD-10-CM | POA: Insufficient documentation

## 2024-01-28 DIAGNOSIS — C773 Secondary and unspecified malignant neoplasm of axilla and upper limb lymph nodes: Secondary | ICD-10-CM | POA: Diagnosis not present

## 2024-01-28 DIAGNOSIS — G893 Neoplasm related pain (acute) (chronic): Secondary | ICD-10-CM

## 2024-01-28 DIAGNOSIS — F419 Anxiety disorder, unspecified: Secondary | ICD-10-CM

## 2024-01-28 LAB — COMPREHENSIVE METABOLIC PANEL WITH GFR
ALT: 90 U/L — ABNORMAL HIGH (ref 0–44)
AST: 46 U/L — ABNORMAL HIGH (ref 15–41)
Albumin: 3.3 g/dL — ABNORMAL LOW (ref 3.5–5.0)
Alkaline Phosphatase: 118 U/L (ref 38–126)
Anion gap: 9 (ref 5–15)
BUN: 16 mg/dL (ref 6–20)
CO2: 24 mmol/L (ref 22–32)
Calcium: 8.9 mg/dL (ref 8.9–10.3)
Chloride: 106 mmol/L (ref 98–111)
Creatinine, Ser: 0.7 mg/dL (ref 0.44–1.00)
GFR, Estimated: >60 mL/min (ref >60)
Glucose, Bld: 106 mg/dL — ABNORMAL HIGH (ref 70–99)
Potassium: 3.9 mmol/L (ref 3.5–5.1)
Sodium: 139 mmol/L (ref 135–145)
Total Bilirubin: 0.5 mg/dL (ref 0.0–1.2)
Total Protein: 6.7 g/dL (ref 6.5–8.1)

## 2024-01-28 LAB — RAPID URINE DRUG SCREEN, HOSP PERFORMED
Amphetamines: NOT DETECTED
Barbiturates: NOT DETECTED
Benzodiazepines: POSITIVE — AB
Cocaine: NOT DETECTED
Opiates: NOT DETECTED
Tetrahydrocannabinol: NOT DETECTED

## 2024-01-28 LAB — CBC WITH DIFFERENTIAL/PLATELET
Abs Immature Granulocytes: 0.02 10*3/uL (ref 0.00–0.07)
Basophils Absolute: 0.1 10*3/uL (ref 0.0–0.1)
Basophils Relative: 1 %
Eosinophils Absolute: 0.2 10*3/uL (ref 0.0–0.5)
Eosinophils Relative: 4 %
HCT: 36 % (ref 36.0–46.0)
Hemoglobin: 11.5 g/dL — ABNORMAL LOW (ref 12.0–15.0)
Immature Granulocytes: 0 %
Lymphocytes Relative: 27 %
Lymphs Abs: 1.4 10*3/uL (ref 0.7–4.0)
MCH: 32.1 pg (ref 26.0–34.0)
MCHC: 31.9 g/dL (ref 30.0–36.0)
MCV: 100.6 fL — ABNORMAL HIGH (ref 80.0–100.0)
Monocytes Absolute: 0.7 10*3/uL (ref 0.1–1.0)
Monocytes Relative: 13 %
Neutro Abs: 2.8 10*3/uL (ref 1.7–7.7)
Neutrophils Relative %: 55 %
Platelets: 293 10*3/uL (ref 150–400)
RBC: 3.58 MIL/uL — ABNORMAL LOW (ref 3.87–5.11)
RDW: 13.4 % (ref 11.5–15.5)
WBC: 5 10*3/uL (ref 4.0–10.5)
nRBC: 0 % (ref 0.0–0.2)

## 2024-01-28 LAB — PREGNANCY, URINE: Preg Test, Ur: NEGATIVE

## 2024-01-28 LAB — MAGNESIUM: Magnesium: 1.8 mg/dL (ref 1.7–2.4)

## 2024-01-28 MED ORDER — FAMOTIDINE IN NACL 20-0.9 MG/50ML-% IV SOLN
20.0000 mg | Freq: Once | INTRAVENOUS | Status: AC
  Administered 2024-01-28: 20 mg via INTRAVENOUS
  Filled 2024-01-28: qty 50

## 2024-01-28 MED ORDER — SODIUM CHLORIDE 0.9% FLUSH
10.0000 mL | INTRAVENOUS | Status: DC | PRN
  Administered 2024-01-28: 10 mL

## 2024-01-28 MED ORDER — ACETAMINOPHEN 325 MG PO TABS
650.0000 mg | ORAL_TABLET | Freq: Once | ORAL | Status: AC
  Administered 2024-01-28: 650 mg via ORAL
  Filled 2024-01-28: qty 2

## 2024-01-28 MED ORDER — DEXAMETHASONE SODIUM PHOSPHATE 10 MG/ML IJ SOLN
10.0000 mg | Freq: Once | INTRAMUSCULAR | Status: AC
  Administered 2024-01-28: 10 mg via INTRAVENOUS
  Filled 2024-01-28: qty 1

## 2024-01-28 MED ORDER — PALONOSETRON HCL INJECTION 0.25 MG/5ML
0.2500 mg | Freq: Once | INTRAVENOUS | Status: AC
  Administered 2024-01-28: 0.25 mg via INTRAVENOUS
  Filled 2024-01-28: qty 5

## 2024-01-28 MED ORDER — SODIUM CHLORIDE 0.9% FLUSH
10.0000 mL | INTRAVENOUS | Status: AC
  Administered 2024-01-28: 10 mL

## 2024-01-28 MED ORDER — SODIUM CHLORIDE 0.9 % IV SOLN
150.0000 mg | Freq: Once | INTRAVENOUS | Status: AC
  Administered 2024-01-28: 150 mg via INTRAVENOUS
  Filled 2024-01-28: qty 150

## 2024-01-28 MED ORDER — ALPRAZOLAM 0.5 MG PO TABS: ORAL_TABLET | ORAL | 0 refills | Status: DC

## 2024-01-28 MED ORDER — FAM-TRASTUZUMAB DERUXTECAN-NXKI CHEMO 100 MG IV SOLR
5.4000 mg/kg | Freq: Once | INTRAVENOUS | Status: AC
  Administered 2024-01-28: 300 mg via INTRAVENOUS
  Filled 2024-01-28: qty 15

## 2024-01-28 MED ORDER — HEPARIN SOD (PORK) LOCK FLUSH 100 UNIT/ML IV SOLN
500.0000 [IU] | Freq: Once | INTRAVENOUS | Status: AC | PRN
  Administered 2024-01-28: 500 [IU]

## 2024-01-28 MED ORDER — DEXTROSE 5 % IV SOLN: Freq: Once | INTRAVENOUS | Status: AC

## 2024-01-28 MED ORDER — OXYCODONE-ACETAMINOPHEN 7.5-325 MG PO TABS: 1.0000 | ORAL_TABLET | Freq: Four times a day (QID) | ORAL | 0 refills | Status: DC | PRN

## 2024-01-28 NOTE — Progress Notes (Signed)
 [facility] [address], [place] [address]    Clinic Day:  01/28/2024  Referring physician: Veda Gerald, MD  Patient Care Team: Veda Gerald, MD as PCP - General (Internal Medicine) Paulett Boros, MD as Medical Oncologist (Medical Oncology) Gerhard Knuckles, RN as Oncology Nurse Navigator (Oncology)   ASSESSMENT & PLAN:   Assessment: 1. Her2+ metastatic breast cancer: - She reports feeling a knot in the right breast in July 2022.  She felt the same lump in September which has increased slightly in November.  In the last 3 months, it has increased in size from a quarter to size of an orange. - Mammogram/ultrasound on 10/01/2021: Large irregular mass involving the entire central right breast measuring 4.9 x 2.4 x 4 cm at 12:00.  At least 3 lymph nodes in the right axilla.  Overlying skin thickening and generalized erythema and marked tenderness. - Right breast mass and axillary lymph node biopsy on 10/08/2021: - Pathology: Poorly differentiated ductal carcinoma, grade 3, HER2 3+ positive, ER 5% strong staining intensity, PR negative, Ki-67 25%. - PET scan (11/30/2021): Hypermetabolic tumor involving right breast, metastatic mediastinal and hilar lymphadenopathy, hepatic metastatic disease, diffuse lytic bone lesions. - 4 cycles of THP from 12/03/2021 through 02/25/2022 - Cerebellar mass resection (11/12/2022): Metastatic carcinoma with tumor cells positive for CK7, GATA3, indicating breast primary.  ER and CK20 are negative.  HER2 is 3+. - Enhertu  started on 01/01/2023   2. Social/family history: - She is currently living with her father, youngest daughter and sister.  She has 2 daughters ages 28 and 31.  She has worked in Journalist, newspaper previously.  She quit smoking 3 months ago.  Prior to that she smoked a pack a day for 4 to 5 years and less than half pack a day since 2011.  She vapes occasionally at this time. - Paternal great grandmother had  lung cancer.  Paternal aunt had lung cancer at age 18.  Paternal grandmother had breast cancer in her 41s.  Paternal grandfather had colon cancer.  Maternal grandfather had colon cancer.    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  Continued improvement of the irregular nodularity and interlobular septal thickening.  No definitive evidence of pulm metastatic disease. - She is tolerating Enhertu  reasonably well.  No major GI side effects.  She has missed her CT scans again due to lack of ride. - Labs today: CBC grossly normal with mild microcytic anemia stable.  LFTs show elevated AST of 46 and ALT of 90.  Bilirubin is normal.  Creatinine is normal. - She will proceed with Enhertu  today.  Will reschedule CT CAP with contrast. - RTC 3 weeks for follow-up.   2.  Difficulty falling asleep: - Continue Remeron  15 mg at bedtime as needed.  3.  Anxiety: - Continue Xanax  0.5 mg daily as needed.  4.  Chest pain, back pain, right ankle pain: - Continue Percocet 7.5 mg twice daily as needed.  Urine drug screen today was positive for benzodiazepines.   5.  High risk drug monitoring: - 2D echo on 08/03/2023 with LVEF 60 to 65%.  Denies any PND or orthopnea. - She has missed multiple echo appointments.  Will reschedule for another 1.    No orders of the defined types were placed in this encounter.     Nadeen Augusta Teague,acting as a Neurosurgeon  for Paulett Boros, MD.,have documented all relevant documentation on the behalf of Paulett Boros, MD,as directed by  Paulett Boros, MD while in the presence of Paulett Boros, MD.  I, Paulett Boros MD, have reviewed the above documentation for accuracy and completeness, and I agree with the above.       Paulett Boros, MD   6/12/202511:38 AM  CHIEF COMPLAINT:   Diagnosis: Her2+ metastatic right breast cancer    Cancer  Staging  HER2-positive carcinoma of right breast [facility]) Staging form: Breast, AJCC 8th Edition - Clinical stage from 10/28/2021: Stage IV (cT4d, cN1, cM1, G3, ER+, PR-, HER2+) - Unsigned    Prior Therapy: 1. DOCEtaxel  + Trastuzumab  + Pertuzumab  (THP) q21d x 4 cycles  2. SRS to brain metastases-- 10 fractions from 12/05/22 through 12/19/22, 1 fraction on 05/13/23  Current Therapy:  Enhertu     HISTORY OF PRESENT ILLNESS:   Oncology History  HER2-positive carcinoma of right breast (HCC)  10/28/2021 Initial Diagnosis   Breast cancer, right breast (HCC)   12/03/2021 - 02/25/2022 Chemotherapy   Patient is on Treatment Plan : BREAST DOCEtaxel  + Trastuzumab  + Pertuzumab  (THP) q21d x 8 cycles / Trastuzumab  + Pertuzumab  q21d x 4 cycles     01/01/2023 -  Chemotherapy   Patient is on Treatment Plan : BREAST METASTATIC Fam-Trastuzumab Deruxtecan-nxki  (Enhertu ) (5.4) q21d        INTERVAL HISTORY:   Tanaysia is a 48 y.o. female presenting to clinic today for follow up of Her2+ metastatic right breast cancer. She was last seen by me on 01/07/24.  Today, she states that she is doing well overall. Her appetite level is at 100%. Her energy level is at 70%. Zenith states she missed her EKG as she went to the wrong location. She did not have transportation to the CT scan scheduled earlier this week and cancelled it. She also did not go to her brain MRI.   Her breathing and chest pain have improved. She attributes previous symptoms of SOB and chest pain to anxiety.   PAST MEDICAL HISTORY:   Past Medical History: Past Medical History:  Diagnosis Date   Anxiety    Back pain    Cancer [facility])     Surgical History: Past Surgical History:  Procedure Laterality Date   CHOLECYSTECTOMY     ORTHOPEDIC SURGERY     PORTACATH PLACEMENT Left 11/18/2021   Procedure: INSERTION PORT-A-CATH;  Surgeon: Awilda Bogus, MD;  Location: [facility];  Service: General;  Laterality: Left;   SUBOCCIPITAL CRANIECTOMY CERVICAL  LAMINECTOMY N/A 11/12/2022   Procedure: SUBOCCIPITAL CRANIECTOMY FOR RESECTION OF CEREBELLAR MASS;  Surgeon: Gearl Keens, MD;  Location: [facility] OR;  Service: Neurosurgery;  Laterality: N/A;    Social History: Social History   Socioeconomic History   Marital status: Legally Separated    Spouse name: Not on file   Number of children: Not on file   Years of education: Not on file   Highest education level: Not on file  Occupational History   Not on file  Tobacco Use   Smoking status: Former    Current packs/day: 0.00    Average packs/day: 1 pack/day for 5.0 years (5.0 ttl pk-yrs)    Types: Cigarettes    Start date: 11/08/2016    Quit date: 11/08/2021    Years since quitting: 2.2   Smokeless tobacco: Never  Vaping Use   Vaping status: Never Used  Substance and Sexual Activity   Alcohol use: No    Comment: occasionally  Drug use: No   Sexual activity: Yes    Birth control/protection: None  Other Topics Concern   Not on file  Social History Narrative   Not on file   Social Drivers of Health   Financial Resource Strain: High Risk (12/03/2021)   Overall Financial Resource Strain (CARDIA)    Difficulty of Paying Living Expenses: Very hard  Food Insecurity: No Food Insecurity (09/17/2023)   Hunger Vital Sign    Worried About Running Out of Food in the Last Year: Never true    Ran Out of Food in the Last Year: Never true  Transportation Needs: No Transportation Needs (11/25/2022)   PRAPARE - Administrator, Civil Service (Medical): No    Lack of Transportation (Non-Medical): No  Recent Concern: Transportation Needs - Unmet Transportation Needs (11/11/2022)   PRAPARE - Transportation    Lack of Transportation (Medical): Yes    Lack of Transportation (Non-Medical): Yes  Physical Activity: Sufficiently Active (06/17/2021)   Exercise Vital Sign    Days of Exercise per Week: 5 days    Minutes of Exercise per Session: 30 min  Stress: Stress Concern Present (06/17/2021)    [employer] of Occupational Health - Occupational Stress Questionnaire    Feeling of Stress : Rather much  Social Connections: Socially Isolated (06/17/2021)   Social Connection and Isolation Panel    Frequency of Communication with Friends and Family: Twice a week    Frequency of Social Gatherings with Friends and Family: Never    Attends Religious Services: 1 to 4 times per year    Active Member of [employer] or Organizations: No    Attends Banker Meetings: Never    Marital Status: Divorced  Catering manager Violence: Not At Risk (09/17/2023)   Humiliation, Afraid, Rape, and Kick questionnaire    Fear of Current or Ex-Partner: No    Emotionally Abused: No    Physically Abused: No    Sexually Abused: No    Family History: Family History  Problem Relation Age of Onset   Heart disease Brother    Lung cancer Paternal Grandmother        lung   Cancer Paternal Grandfather    Asthma Daughter    Asthma Daughter     Current Medications:  Current Outpatient Medications:    ALPRAZolam  (XANAX ) 0.5 MG tablet, TAKE 1 TABLET BY MOUTH AT BEDTIME AS NEEDED FOR ANXIETY, Disp: 30 tablet, Rfl: 0   docusate sodium  (COLACE) 100 MG capsule, Take 1 capsule (100 mg total) by mouth 2 (two) times daily., Disp: , Rfl:    furosemide  (LASIX ) 20 MG tablet, Take 1 tablet (20 mg total) by mouth daily as needed., Disp: 30 tablet, Rfl: 0   lidocaine -prilocaine  (EMLA ) cream, Apply 1 Application topically as needed., Disp: 30 g, Rfl: 0   mirtazapine  (REMERON ) 15 MG tablet, Take 1 tablet (15 mg total) by mouth at bedtime., Disp: 30 tablet, Rfl: 2   ondansetron  (ZOFRAN ) 8 MG tablet, TAKE 1 TABLET BY MOUTH EVERY 8 HOURS AS NEEDED FOR NAUSEA FOR VOMITING, Disp: 90 tablet, Rfl: 5   oxyCODONE -acetaminophen  (PERCOCET) 7.5-325 MG tablet, Take 1 tablet by mouth every 6 (six) hours as needed for severe pain (pain score 7-10)., Disp: 84 tablet, Rfl: 0   pantoprazole  (PROTONIX ) 20 MG tablet, Take 1 tablet (20  mg total) by mouth daily., Disp: 30 tablet, Rfl: 5   prochlorperazine  (COMPAZINE ) 10 MG tablet, Take 1 tablet (10 mg total) by mouth every 6 (six)  hours as needed for nausea or vomiting., Disp: 30 tablet, Rfl: 5 No current facility-administered medications for this visit.  Facility-Administered Medications Ordered in Other Visits:    fam-trastuzumab deruxtecan-nxki  (ENHERTU ) 300 mg in dextrose  5 % 100 mL chemo infusion, 5.4 mg/kg (Order-Specific), Intravenous, Once, Jasmain Ahlberg, MD   famotidine  (PEPCID ) IVPB 20 mg premix, 20 mg, Intravenous, Once, Paulett Boros, MD   fosaprepitant  (EMEND) 150 mg in sodium chloride  0.9 % 145 mL IVPB, 150 mg, Intravenous, Once, Paulett Boros, MD, Last Rate: 450 mL/hr at 01/28/24 1134, 150 mg at 01/28/24 1134   heparin  lock flush 100 unit/mL, 500 Units, Intracatheter, Once PRN, Keymani Glynn, MD   sodium chloride  flush (NS) 0.9 % injection 10 mL, 10 mL, Intracatheter, PRN, Koal Eslinger, MD   Allergies: Allergies  Allergen Reactions   Hydrocodone  Itching, Nausea And Vomiting, Rash and Other (See Comments)    Hallucinations when given vicodin post crani in 2024   Sulfa Antibiotics Nausea And Vomiting   Morphine  And Codeine Rash    REVIEW OF SYSTEMS:   Review of Systems  Constitutional:  Negative for chills, fatigue and fever.  HENT:   Negative for lump/mass, mouth sores, nosebleeds, sore throat and trouble swallowing.   Eyes:  Negative for eye problems.  Respiratory:  Negative for cough and shortness of breath.   Cardiovascular:  Positive for chest pain. Negative for leg swelling and palpitations.  Gastrointestinal:  Positive for diarrhea, nausea and vomiting. Negative for abdominal pain and constipation.  Genitourinary:  Negative for bladder incontinence, difficulty urinating, dysuria, frequency, hematuria and nocturia.   Musculoskeletal:  Positive for arthralgias (in hips, 7/10 severity) and back pain (7/10 severity).  Negative for flank pain, myalgias and neck pain.  Skin:  Negative for itching and rash.  Neurological:  Positive for numbness (in hands and feet). Negative for dizziness and headaches.  Hematological:  Does not bruise/bleed easily.  Psychiatric/Behavioral:  Negative for depression, sleep disturbance and suicidal ideas. The patient is nervous/anxious.   All other systems reviewed and are negative.    VITALS:   There were no vitals taken for this visit.  Wt Readings from Last 3 Encounters:  01/28/24 132 lb (59.9 kg)  01/07/24 132 lb 11.5 oz (60.2 kg)  12/10/23 126 lb 11.2 oz (57.5 kg)    There is no height or weight on file to calculate BMI.  Performance status (ECOG): 1 - Symptomatic but completely ambulatory  PHYSICAL EXAM:   Physical Exam Vitals and nursing note reviewed. Exam conducted with a chaperone present.  Constitutional:      Appearance: Normal appearance.   Cardiovascular:     Rate and Rhythm: Normal rate and regular rhythm.     Pulses: Normal pulses.     Heart sounds: Normal heart sounds.  Pulmonary:     Effort: Pulmonary effort is normal.     Breath sounds: Normal breath sounds.  Abdominal:     Palpations: Abdomen is soft. There is no hepatomegaly, splenomegaly or mass.     Tenderness: There is no abdominal tenderness.   Musculoskeletal:     Right lower leg: No edema.     Left lower leg: No edema.  Lymphadenopathy:     Cervical: No cervical adenopathy.     Right cervical: No superficial, deep or posterior cervical adenopathy.    Left cervical: No superficial, deep or posterior cervical adenopathy.     Upper Body:     Right upper body: No supraclavicular or axillary adenopathy.  Left upper body: No supraclavicular or axillary adenopathy.   Neurological:     General: No focal deficit present.     Mental Status: She is alert and oriented to person, place, and time.   Psychiatric:        Mood and Affect: Mood normal.        Behavior: Behavior normal.      LABS:      Latest Ref Rng & Units 01/28/2024   10:15 AM 01/07/2024   10:12 AM 12/10/2023    9:07 AM  CBC  WBC 4.0 - 10.5 K/uL 5.0  6.9  8.2   Hemoglobin 12.0 - 15.0 g/dL 16.1  09.6  04.5   Hematocrit 36.0 - 46.0 % 36.0  35.8  40.3   Platelets 150 - 400 K/uL 293  229  266       Latest Ref Rng & Units 01/28/2024   10:15 AM 01/07/2024   10:12 AM 12/10/2023    9:07 AM  CMP  Glucose 70 - 99 mg/dL 409  93  811   BUN 6 - 20 mg/dL 16  23  22    Creatinine 0.44 - 1.00 mg/dL 9.14  7.82  9.56   Sodium 135 - 145 mmol/L 139  137  138   Potassium 3.5 - 5.1 mmol/L 3.9  3.9  3.6   Chloride 98 - 111 mmol/L 106  104  104   CO2 22 - 32 mmol/L 24  25  24    Calcium 8.9 - 10.3 mg/dL 8.9  9.1  9.7   Total Protein 6.5 - 8.1 g/dL 6.7  6.6  7.4   Total Bilirubin 0.0 - 1.2 mg/dL 0.5  0.4  <2.1   Alkaline Phos 38 - 126 U/L 118  72  66   AST 15 - 41 U/L 46  20  28   ALT 0 - 44 U/L 90  14  19      No results found for: CEA1, CEA / No results found for: CEA1, CEA No results found for: PSA1 No results found for: HYQ657 No results found for: QIO962  No results found for: TOTALPROTELP, ALBUMINELP, A1GS, A2GS, BETS, BETA2SER, GAMS, MSPIKE, SPEI No results found for: TIBC, FERRITIN, IRONPCTSAT No results found for: LDH   STUDIES:   No results found.

## 2024-01-28 NOTE — Progress Notes (Signed)
 Patient presents today for Enhertu  infusion per providers order.  Vital signs and labs reviewed by MD.  Message received from Donzell Gallery RN/Dr. Katragadda,

## 2024-01-28 NOTE — Progress Notes (Signed)

## 2024-01-28 NOTE — Patient Instructions (Signed)

## 2024-01-28 NOTE — Telephone Encounter (Signed)
 Left another detailed voicemail requesting pt call me back about getting the missed brain MRI rescheduled. I requested a return call to inform me if she is willing to have the scan or if she would prefer not to.   Axel Bohr R.T(R)(T) Radiation Special Procedures Lead

## 2024-01-28 NOTE — Progress Notes (Signed)
 Ok to proceed with ALT 90 per Dr Cheree Cords.  Ok to proceed with ECHO 08/03/23 results, next ECHO scheduled 02/08/24.  Augie Bliss, PharmD

## 2024-01-28 NOTE — Patient Instructions (Signed)
 [facility] [place] - A DEPT OF [facility]  Discharge Instructions: Thank you for choosing [facility] to provide your oncology and hematology care.  If you have a lab appointment with the Cancer Center - please note that after April 8th, 2024, all labs will be drawn in the cancer center.  You do not have to check in or register with the main entrance as you have in the past but will complete your check-in in the cancer center.  Wear comfortable clothing and clothing appropriate for easy access to any Portacath or PICC line.   We strive to give you quality time with your provider. You may need to reschedule your appointment if you arrive late (15 or more minutes).  Arriving late affects you and other patients whose appointments are after yours.  Also, if you miss three or more appointments without notifying the office, you may be dismissed from the clinic at the provider's discretion.      For prescription refill requests, have your pharmacy contact our office and allow 72 hours for refills to be completed.    Today you received the following chemotherapy and/or immunotherapy agents enhertu      To help prevent nausea and vomiting after your treatment, we encourage you to take your nausea medication as directed.  BELOW ARE SYMPTOMS THAT SHOULD BE REPORTED IMMEDIATELY: *FEVER GREATER THAN 100.4 F (38 C) OR HIGHER *CHILLS OR SWEATING *NAUSEA AND VOMITING THAT IS NOT CONTROLLED WITH YOUR NAUSEA MEDICATION *UNUSUAL SHORTNESS OF BREATH *UNUSUAL BRUISING OR BLEEDING *URINARY PROBLEMS (pain or burning when urinating, or frequent urination) *BOWEL PROBLEMS (unusual diarrhea, constipation, pain near the anus) TENDERNESS IN MOUTH AND THROAT WITH OR WITHOUT PRESENCE OF ULCERS (sore throat, sores in mouth, or a toothache) UNUSUAL RASH, SWELLING OR PAIN  UNUSUAL VAGINAL DISCHARGE OR ITCHING   Items with * indicate a potential emergency and should be followed up  as soon as possible or go to the Emergency Department if any problems should occur.  Please show the CHEMOTHERAPY ALERT CARD or IMMUNOTHERAPY ALERT CARD at check-in to the Emergency Department and triage nurse.  Should you have questions after your visit or need to cancel or reschedule your appointment, please contact [facility] [place] - A DEPT OF Eligha Brideg[facility] [phone]  and follow the prompts.  Office hours are 8:00 a.m. to 4:30 p.m. Monday - Friday. Please note that voicemails left after 4:00 p.m. may not be returned until the following business day.  We are closed weekends and major holidays. You have access to a nurse at all times for urgent questions. Please call the main number to the clinic [phone] and follow the prompts.  For any non-urgent questions, you may also contact your provider using MyChart. We now offer e-Visits for anyone 59 and older to request care online for non-urgent symptoms. For details visit [email or web address].   Also download the MyChart app! Go to the app store, search "MyChart", open the app, select [place], and log in with your MyChart username and password.

## 2024-01-29 ENCOUNTER — Other Ambulatory Visit: Payer: Self-pay

## 2024-01-30 ENCOUNTER — Other Ambulatory Visit: Payer: Self-pay

## 2024-02-02 ENCOUNTER — Ambulatory Visit (HOSPITAL_COMMUNITY)
Admission: RE | Admit: 2024-02-02 | Discharge: 2024-02-02 | Disposition: A | Discharge status: Home / Self Care | Payer: MEDICAID | Source: Ambulatory Visit | Attending: Radiation Oncology | Admitting: Radiation Oncology

## 2024-02-02 ENCOUNTER — Other Ambulatory Visit: Payer: Self-pay | Admitting: Radiation Therapy

## 2024-02-02 DIAGNOSIS — C7931 Secondary malignant neoplasm of brain: Secondary | ICD-10-CM | POA: Insufficient documentation

## 2024-02-02 MED ORDER — GADOBUTROL 1 MMOL/ML IV SOLN
6.0000 mL | Freq: Once | INTRAVENOUS | Status: AC | PRN
Start: 2024-02-02 — End: 2024-02-02
  Administered 2024-02-02: 6 mL via INTRAVENOUS

## 2024-02-02 NOTE — Progress Notes (Signed)
 Apt rescheduled

## 2024-02-03 ENCOUNTER — Ambulatory Visit
Admission: RE | Admit: 2024-02-03 | Discharge: 2024-02-03 | Disposition: A | Discharge status: Home / Self Care | Payer: MEDICAID | Source: Ambulatory Visit | Attending: Urology | Admitting: Urology

## 2024-02-03 ENCOUNTER — Telehealth: Payer: Self-pay | Admitting: Radiation Therapy

## 2024-02-03 ENCOUNTER — Telehealth: Payer: Self-pay | Admitting: Urology

## 2024-02-03 DIAGNOSIS — C7951 Secondary malignant neoplasm of bone: Secondary | ICD-10-CM | POA: Insufficient documentation

## 2024-02-03 DIAGNOSIS — Z1731 Human epidermal growth factor receptor 2 positive status: Secondary | ICD-10-CM | POA: Insufficient documentation

## 2024-02-03 DIAGNOSIS — C50911 Malignant neoplasm of unspecified site of right female breast: Secondary | ICD-10-CM | POA: Insufficient documentation

## 2024-02-03 DIAGNOSIS — Z170 Estrogen receptor positive status [ER+]: Secondary | ICD-10-CM | POA: Insufficient documentation

## 2024-02-03 DIAGNOSIS — Z5112 Encounter for antineoplastic immunotherapy: Secondary | ICD-10-CM | POA: Insufficient documentation

## 2024-02-03 DIAGNOSIS — C7931 Secondary malignant neoplasm of brain: Secondary | ICD-10-CM | POA: Insufficient documentation

## 2024-02-03 DIAGNOSIS — C787 Secondary malignant neoplasm of liver and intrahepatic bile duct: Secondary | ICD-10-CM | POA: Insufficient documentation

## 2024-02-03 DIAGNOSIS — C773 Secondary and unspecified malignant neoplasm of axilla and upper limb lymph nodes: Secondary | ICD-10-CM | POA: Insufficient documentation

## 2024-02-03 NOTE — Telephone Encounter (Signed)
 I attempted to call the patient this morning to review the results of her recent MRI brain scan from 02/02/2024 but she was unavailable so I left a message on her voicemail requesting she return my call.  This scan does show approximately 6 new subcentimeter brain lesions and the recommendation is to proceed with a 3 fraction course of stereotactic radiosurgery [facility]) to treat the new lesions.  Fortunately the previously treated disease appears stable.  I will await her return call to discuss the findings and recommendations with her so that we can proceed with treatment planning accordingly if she is in agreement.  Arta Bihari, MMS, PA-C [place]  [facility] Radiation Oncology Physician Assistant Direct Dial: [phone]  Fax: [phone]

## 2024-02-03 NOTE — Progress Notes (Signed)
 Radiation Oncology         [phone] ________________________________  Name: Sara [name] MRN: [id number]  Date: 02/03/2024  DOB: Jan 24, 1976  Post Treatment Note  CC: Veda Gerald, MD  Veda Gerald, MD  Diagnosis:   48 yo woman with brain metastases secondary to metastatic breast cancer.   Interval Since Last Radiation:  9 months  05/13/23//SRS:  The following two targets were treated to 20 Gy in a single fraction of SRS:    12/05/22 - 12/19/22:  The brain received 20 Gy in a single fraction to her two intact metastases in the right parietal lobe (1.3 cm) and falx cerebri (1.2 cm) and 30 Gy in 10 fractions to the resection cavity in the right cerebellum/posterior fossa.    Plan Name: Brain_SRS Site: Brain Technique: SBRT/SRT-IMRT Mode: Photon Dose Per Fraction: 20 Gy Prescribed Dose (Delivered / Prescribed): 20 Gy / 20 Gy Prescribed Fxs (Delivered / Prescribed): 1 / 1   Plan Name: Brain_PstFos Site: Brain Technique: 3D Mode: Photon Dose Per Fraction: 3 Gy Prescribed Dose (Delivered / Prescribed): 30 Gy / 30 Gy Prescribed Fxs (Delivered / Prescribed): 10 / 10  Narrative:  I spoke with the patient to conduct her routine scheduled follow up visit to review her MRI brain results via telephone to spare the patient unnecessary potential exposure in the healthcare setting during the current COVID-19 pandemic.  The patient was notified in advance and gave permission to proceed with this visit format.    She tolerated the [facility] treatment well, without any acute ill side effects.  She had her post-treatment MRI brain scan on 09/14/23 and this showed a stable appearance of the brain as compared to 05/02/2023 with postoperative changes in the right cerebellum without evidence of residual or recurrent viable tumor and a stable appearance of the treated metastasis in the medial right parietal lobe. Additionally, there was a stable punctate focus of enhancement in the right caudate head and  along the surface of the right parietal lobe but no new lesions noted.  The recommendation was to proceed with serial MRI brain scans every 3 months but the patient cancelled the scheduled scan in 11/2023 are reported that she did not want to continue with the surveillance imaging of the brain due to difficulties with transportation despite the cancer center providing transportation to and from the visits.We continued to follow up with patient to encourage surveillance scans and she did agree to proceed with a repeat MRI brain scan at [facility] on 02/02/24.  This scan showed multiple new small cerebral metastases present (approximately 6, all sub-centimeter). The previously demonstrated metastatic lesions within the right parietal lobe and caudate were not changed significantly and stable postoperative encephalomalacia changes within the right cerebellar hemisphere. We reviewed these findings by telephone today and Dr. Arletta Bender recommendation to proceed with SRS to treat the new lesions.                 She has continued on Enhertu  therapy under the care of Dr. Cheree Cords. She started this on 01/01/2023 and continues to tolerate this well and is scheduled for restaging imaging 02/11/24.            On review of systems, the patient states that she is doing well overall aside from significant anxiety around the time of her scans. She does have some imbalance for approximately 5 days following each of her systemic treatments but this resolves spontaneously and then recurs with the next cycle of  treatment. She denies any more frequent headaches or any nausea, vomiting, visual disturbances, extremity weakness, or other concerns today.    ALLERGIES:  is allergic to hydrocodone , sulfa antibiotics, and morphine  and codeine.  Meds: Current Outpatient Medications  Medication Sig Dispense Refill   ALPRAZolam  (XANAX ) 0.5 MG tablet TAKE 1 TABLET BY MOUTH AT BEDTIME AS NEEDED FOR ANXIETY 30 tablet 0   docusate sodium   (COLACE) 100 MG capsule Take 1 capsule (100 mg total) by mouth 2 (two) times daily.     furosemide  (LASIX ) 20 MG tablet Take 1 tablet (20 mg total) by mouth daily as needed. 30 tablet 0   lidocaine -prilocaine  (EMLA ) cream Apply 1 Application topically as needed. 30 g 0   mirtazapine  (REMERON ) 15 MG tablet Take 1 tablet (15 mg total) by mouth at bedtime. 30 tablet 2   ondansetron  (ZOFRAN ) 8 MG tablet TAKE 1 TABLET BY MOUTH EVERY 8 HOURS AS NEEDED FOR NAUSEA FOR VOMITING 90 tablet 5   oxyCODONE -acetaminophen  (PERCOCET) 7.5-325 MG tablet Take 1 tablet by mouth every 6 (six) hours as needed for severe pain (pain score 7-10). 84 tablet 0   pantoprazole  (PROTONIX ) 20 MG tablet Take 1 tablet (20 mg total) by mouth daily. 30 tablet 5   prochlorperazine  (COMPAZINE ) 10 MG tablet Take 1 tablet (10 mg total) by mouth every 6 (six) hours as needed for nausea or vomiting. 30 tablet 5   No current facility-administered medications for this visit.    Physical Findings:  vitals were not taken for this visit.   /10 Unable to assess due to telephone follow-up visit format.  Lab Findings: Lab Results  Component Value Date   WBC 5.0 01/28/2024   HGB 11.5 (L) 01/28/2024   HCT 36.0 01/28/2024   MCV 100.6 (H) 01/28/2024   PLT 293 01/28/2024     Radiographic Findings: MR Brain W Wo Contrast Result Date: 02/02/2024 CLINICAL DATA:  Brain metastases, assess treatment response 3T SRS Protocol EXAM: MRI HEAD WITHOUT AND WITH CONTRAST TECHNIQUE: Multiplanar, multiecho pulse sequences of the brain and surrounding structures were obtained without and with intravenous contrast. CONTRAST:  6mL GADAVIST  GADOBUTROL  1 MMOL/ML IV SOLN COMPARISON:  MRI of the brain dated September 14, 2023. FINDINGS: Brain: Postoperative encephalomalacia changes are again demonstrated within the right cerebellar hemisphere and are unchanged in the interim. There is no apparent residual recurrent metastatic disease in the right cerebellum. There  is a new 3 mm nodular lesion present in the cortex of the right parietal lobe seen on image 237 of series 1100. There is also a new nodular lesion present medially within the left parietal lobe on image 247. There also appears to be a new lesion within the left medial temporal lobe on image 172, which is also supported on image 21 of coronal series number 9. There are new small nodular foci also present within the right occipital lobe on image 191, which are also evident on image 177 of the sagittal series 11. The lesion previously described within the right medial parietal lobe is again demonstrated on image 226 of series 1100. A lesion previously noted within the right caudate nucleus is less conspicuous, but again demonstrated image 188. Vascular: Normal vascular flow voids. Skull and upper cervical spine: Normal bone marrow signal. No osseous lesions are evident. Status post right occipital craniotomy. Sinuses/Orbits: No acute abnormality. Other: None. IMPRESSION: 1. There are multiple new small cerebral metastases present. The previously demonstrated metastatic lesions within the right parietal lobe and caudate evident not  changed significantly. 2. Stable postoperative encephalomalacia changes within the right cerebellar hemisphere. Electronically Signed   By: Maribeth Shivers M.D.   On: 02/02/2024 16:54    Impression/Plan: 1. 48 yo woman with 6 new brain metastases secondary to metastatic breast cancer.  Today, I talked to the patient about the findings on her recent MRI brain scan. We discussed the natural history of metastatic breast carcinoma and general treatment, highlighting the role of radiotherapy in the management of brain metastasis. We discussed the available radiation techniques, and focused on the details and logistics of delivery which she is familiar with from previous treatments. The recommendation is for a 3 fraction course of stereotactic radiosurgery [facility]) to the 6 new lesions.  We  reviewed the anticipated acute and late sequelae associated with radiation in this setting. The patient was encouraged to ask questions that were answered to her stated satisfaction and she is in agreement to proceed.  She is tentatively scheduled for CT simulation at 9:30 AM on Friday, 02/05/2024 in anticipation of beginning her first treatment on 02/16/2024 pending coordination with her neurosurgeon.  She appears to have a good understanding of her disease and our treatment recommendations which are of curative intent.  We will share our discussion with Dr. Katragadda and proceed with treatment planning accordingly.    I personally spent 30 minutes in this encounter including chart review, reviewing radiological studies, telephone conversation with the patient, entering orders, coordinating her care and completing documentation.     Arta Bihari, PA-C

## 2024-02-03 NOTE — Telephone Encounter (Signed)
 Left a voicemail requesting pt to call back to discuss her recent brain MRI results.   Axel Bohr R.T(R)(T) Radiation Special Procedures Lead

## 2024-02-04 ENCOUNTER — Telehealth: Payer: Self-pay | Admitting: Radiation Therapy

## 2024-02-04 NOTE — Telephone Encounter (Signed)
 I spoke with Sara [name] about her recent brain MRI results and shared that Dr. Lorri Rota would like to offer salvage focused radiation to treat the new areas using radiosurgery. She was a bit shaken by the news of more mets in her brain, but eager to move forward with treatment. Sim is scheduled tomorrow morning, 6/20. I have reached out to [place] to assist with transportation set up for her.   Axel Bohr R.T(R)(T) Radiation Special Procedures Lead

## 2024-02-05 ENCOUNTER — Other Ambulatory Visit: Payer: Self-pay

## 2024-02-05 ENCOUNTER — Other Ambulatory Visit: Payer: Self-pay | Admitting: Radiation Therapy

## 2024-02-05 ENCOUNTER — Ambulatory Visit
Admission: RE | Admit: 2024-02-05 | Discharge: 2024-02-05 | Disposition: A | Discharge status: Home / Self Care | Payer: MEDICAID | Source: Ambulatory Visit | Attending: Urology | Admitting: Urology

## 2024-02-05 ENCOUNTER — Inpatient Hospital Stay: Payer: MEDICAID

## 2024-02-05 ENCOUNTER — Telehealth: Payer: Self-pay | Admitting: Licensed Clinical Social Worker

## 2024-02-05 ENCOUNTER — Ambulatory Visit
Admission: RE | Admit: 2024-02-05 | Discharge: 2024-02-05 | Disposition: A | Discharge status: Home / Self Care | Payer: MEDICAID | Source: Ambulatory Visit | Attending: Radiation Oncology | Admitting: Radiation Oncology

## 2024-02-05 VITALS — BP 131/87 | HR 88 | Temp 98.0°F | Resp 18 | Ht 64.0 in | Wt 130.0 lb

## 2024-02-05 DIAGNOSIS — Z5112 Encounter for antineoplastic immunotherapy: Secondary | ICD-10-CM | POA: Diagnosis not present

## 2024-02-05 DIAGNOSIS — C50911 Malignant neoplasm of unspecified site of right female breast: Secondary | ICD-10-CM

## 2024-02-05 MED ORDER — HEPARIN SOD (PORK) LOCK FLUSH 100 UNIT/ML IV SOLN: 500.0000 [IU] | Freq: Once | INTRAVENOUS | Status: DC

## 2024-02-05 MED ORDER — SODIUM CHLORIDE 0.9% FLUSH: 10.0000 mL | INTRAVENOUS | Status: DC | PRN

## 2024-02-05 NOTE — Progress Notes (Signed)
  Radiation Oncology         (336) [phone] ________________________________  Name: Sara [name] MRN: [id number]  Date: 02/05/2024  DOB: 1976-05-16  SIMULATION AND TREATMENT PLANNING NOTE    ICD-10-CM   1. 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  Identity was confirmed.  All relevant records and images related to the planned course of therapy were reviewed.  The patient freely provided informed written consent to proceed with treatment after reviewing the details related to the planned course of therapy. The consent form was witnessed and verified by the simulation staff. Intravenous access was established for contrast administration. Then, the patient was set-up in a stable reproducible supine position for radiation therapy.  A relocatable thermoplastic stereotactic head frame was fabricated for precise immobilization.  CT images were obtained.  Surface markings were placed.  The CT images were loaded into the planning software and fused with the patient's targeting MRI scan.  Then the target and avoidance structures were contoured.  Treatment planning then occurred.  The radiation prescription was entered and confirmed.  I have requested 3D planning  I have requested a DVH of the following structures: Brain stem, brain, left eye, right eye, lenses, optic chiasm, target volumes, uninvolved brain, and normal tissue.    SPECIAL TREATMENT PROCEDURE:  The planned course of therapy using radiation constitutes a special treatment procedure. Special care is required in the management of this patient for the following reasons. This treatment constitutes a Special Treatment Procedure for the following reason: High dose per fraction requiring special monitoring for increased  toxicities of treatment including daily imaging.  The special nature of the planned course of radiotherapy will require increased physician supervision and oversight to ensure patient's safety with optimal treatment outcomes.  This requires extended time and effort.  PLAN:  The patient will receive 27 Gy in 3 fractions.  ________________________________  Trilby Fujisawa Lorri Rota, M.D.

## 2024-02-05 NOTE — Progress Notes (Signed)
 Has armband been applied?  Yes.    Does patient have an allergy to IV contrast dye?: No.   Has patient ever received premedication for IV contrast dye?: Yes.     Does patient take metformin?: No.  If patient does take metformin when was the last dose:   Date of lab work: 01/28/24 BUN: 16 CR: 0.70 eGfr: >60  IV site: Port  Has IV site been added to flowsheet?  Yes.    BP 131/87 (BP Location: Left Arm)   Pulse 88   Temp 98 F (36.7 C) (Temporal)   Resp 18   Ht 5' 4 (1.626 m)   Wt 130 lb (59 kg)   SpO2 100%   BMI 22.31 kg/m

## 2024-02-05 NOTE — Telephone Encounter (Signed)
 CSW received a message to call pt.  CSW attempted to reach pt by phone w/ no answer.  VM left for pt w/ contact details to reach CSW.

## 2024-02-08 ENCOUNTER — Ambulatory Visit (HOSPITAL_COMMUNITY)
Admission: RE | Admit: 2024-02-08 | Discharge: 2024-02-08 | Disposition: A | Discharge status: Home / Self Care | Payer: MEDICAID | Source: Ambulatory Visit | Attending: Hematology | Admitting: Hematology

## 2024-02-08 ENCOUNTER — Telehealth: Payer: Self-pay | Admitting: Genetic Counselor

## 2024-02-08 DIAGNOSIS — Z79899 Other long term (current) drug therapy: Secondary | ICD-10-CM | POA: Diagnosis present

## 2024-02-08 LAB — ECHOCARDIOGRAM COMPLETE
AR max vel: 1.98 cm2
AV Area VTI: 2.06 cm2
AV Area mean vel: 2.07 cm2
AV Mean grad: 3.9 mmHg
AV Peak grad: 8.3 mmHg
Ao pk vel: 1.44 m/s
Area-P 1/2: 4.21 cm2
S' Lateral: 2.2 cm

## 2024-02-08 NOTE — Telephone Encounter (Signed)
 Patient see on the brain tumor board for metastatic breast cancer.  Messaged Dr. Rogers to let him know that we recommend genetic testing due to young age of breast cancer and metastatic disease.  It looks like patient was referred in 2023 but may not have kept that appointment.

## 2024-02-08 NOTE — Progress Notes (Signed)
*  PRELIMINARY RESULTS* Echocardiogram 2D Echocardiogram has been performed.  Teresa Aida PARAS 02/08/2024, 12:09 PM

## 2024-02-09 ENCOUNTER — Other Ambulatory Visit: Payer: Self-pay | Admitting: *Deleted

## 2024-02-09 NOTE — Progress Notes (Signed)
 Order management.

## 2024-02-10 ENCOUNTER — Other Ambulatory Visit: Payer: Self-pay | Admitting: Hematology

## 2024-02-10 ENCOUNTER — Inpatient Hospital Stay
Admission: RE | Admit: 2024-02-10 | Discharge: 2024-02-10 | Disposition: A | Discharge status: Home / Self Care | Payer: MEDICAID | Source: Ambulatory Visit | Attending: Urology | Admitting: Urology

## 2024-02-10 DIAGNOSIS — C50911 Malignant neoplasm of unspecified site of right female breast: Secondary | ICD-10-CM

## 2024-02-10 DIAGNOSIS — F419 Anxiety disorder, unspecified: Secondary | ICD-10-CM

## 2024-02-10 DIAGNOSIS — G893 Neoplasm related pain (acute) (chronic): Secondary | ICD-10-CM

## 2024-02-11 ENCOUNTER — Other Ambulatory Visit: Payer: Self-pay | Admitting: *Deleted

## 2024-02-11 ENCOUNTER — Ambulatory Visit (HOSPITAL_COMMUNITY)
Admission: RE | Admit: 2024-02-11 | Discharge: 2024-02-11 | Disposition: A | Discharge status: Home / Self Care | Payer: MEDICAID | Source: Ambulatory Visit | Attending: Hematology | Admitting: Hematology

## 2024-02-11 DIAGNOSIS — Z1731 Human epidermal growth factor receptor 2 positive status: Secondary | ICD-10-CM | POA: Diagnosis present

## 2024-02-11 DIAGNOSIS — C50911 Malignant neoplasm of unspecified site of right female breast: Secondary | ICD-10-CM | POA: Diagnosis present

## 2024-02-11 MED ORDER — IOHEXOL 300 MG/ML  SOLN
100.0000 mL | Freq: Once | INTRAMUSCULAR | Status: AC | PRN
  Administered 2024-02-11: 100 mL via INTRAVENOUS

## 2024-02-15 ENCOUNTER — Encounter: Payer: Self-pay | Admitting: Hematology

## 2024-02-16 ENCOUNTER — Other Ambulatory Visit: Payer: Self-pay | Admitting: Radiation Therapy

## 2024-02-16 ENCOUNTER — Ambulatory Visit: Payer: MEDICAID | Admitting: Radiation Oncology

## 2024-02-16 ENCOUNTER — Telehealth: Payer: Self-pay | Admitting: Radiation Therapy

## 2024-02-16 ENCOUNTER — Ambulatory Visit
Admission: RE | Admit: 2024-02-16 | Discharge: 2024-02-16 | Disposition: A | Discharge status: Home / Self Care | Payer: MEDICAID | Source: Ambulatory Visit | Attending: Radiation Oncology | Admitting: Radiation Oncology

## 2024-02-16 DIAGNOSIS — Z5112 Encounter for antineoplastic immunotherapy: Secondary | ICD-10-CM | POA: Diagnosis present

## 2024-02-16 DIAGNOSIS — C50911 Malignant neoplasm of unspecified site of right female breast: Secondary | ICD-10-CM | POA: Diagnosis not present

## 2024-02-16 DIAGNOSIS — C7931 Secondary malignant neoplasm of brain: Secondary | ICD-10-CM | POA: Diagnosis not present

## 2024-02-16 DIAGNOSIS — C787 Secondary malignant neoplasm of liver and intrahepatic bile duct: Secondary | ICD-10-CM | POA: Diagnosis not present

## 2024-02-16 DIAGNOSIS — Z170 Estrogen receptor positive status [ER+]: Secondary | ICD-10-CM | POA: Diagnosis not present

## 2024-02-16 DIAGNOSIS — Z1722 Progesterone receptor negative status: Secondary | ICD-10-CM | POA: Diagnosis not present

## 2024-02-16 DIAGNOSIS — Z1731 Human epidermal growth factor receptor 2 positive status: Secondary | ICD-10-CM | POA: Diagnosis not present

## 2024-02-16 DIAGNOSIS — C7951 Secondary malignant neoplasm of bone: Secondary | ICD-10-CM | POA: Diagnosis not present

## 2024-02-16 NOTE — Telephone Encounter (Signed)
 Attempted multiple times to reach patient to let her know the driver is on their way to pick her up for treatment. Left several messages.  Devere Perch R.T(R)(T) Radiation Special Procedures Lead

## 2024-02-17 ENCOUNTER — Other Ambulatory Visit: Payer: Self-pay

## 2024-02-18 ENCOUNTER — Other Ambulatory Visit: Payer: Self-pay

## 2024-02-18 ENCOUNTER — Encounter: Payer: Self-pay | Admitting: Hematology

## 2024-02-18 ENCOUNTER — Inpatient Hospital Stay: Payer: MEDICAID

## 2024-02-18 ENCOUNTER — Other Ambulatory Visit: Payer: Self-pay | Admitting: *Deleted

## 2024-02-18 ENCOUNTER — Inpatient Hospital Stay: Payer: MEDICAID | Admitting: Hematology

## 2024-02-18 ENCOUNTER — Encounter: Payer: Self-pay | Admitting: *Deleted

## 2024-02-18 ENCOUNTER — Inpatient Hospital Stay (HOSPITAL_BASED_OUTPATIENT_CLINIC_OR_DEPARTMENT_OTHER): Payer: MEDICAID | Admitting: Hematology

## 2024-02-18 ENCOUNTER — Inpatient Hospital Stay: Payer: MEDICAID | Attending: Hematology

## 2024-02-18 VITALS — HR 88

## 2024-02-18 VITALS — BP 112/80 | HR 102 | Temp 98.0°F | Resp 20 | Wt 127.4 lb

## 2024-02-18 DIAGNOSIS — Z5112 Encounter for antineoplastic immunotherapy: Secondary | ICD-10-CM | POA: Diagnosis not present

## 2024-02-18 DIAGNOSIS — C7931 Secondary malignant neoplasm of brain: Secondary | ICD-10-CM | POA: Insufficient documentation

## 2024-02-18 DIAGNOSIS — Z79899 Other long term (current) drug therapy: Secondary | ICD-10-CM

## 2024-02-18 DIAGNOSIS — Z170 Estrogen receptor positive status [ER+]: Secondary | ICD-10-CM | POA: Insufficient documentation

## 2024-02-18 DIAGNOSIS — Z95828 Presence of other vascular implants and grafts: Secondary | ICD-10-CM

## 2024-02-18 DIAGNOSIS — C50911 Malignant neoplasm of unspecified site of right female breast: Secondary | ICD-10-CM

## 2024-02-18 DIAGNOSIS — F419 Anxiety disorder, unspecified: Secondary | ICD-10-CM

## 2024-02-18 DIAGNOSIS — Z1731 Human epidermal growth factor receptor 2 positive status: Secondary | ICD-10-CM | POA: Diagnosis not present

## 2024-02-18 DIAGNOSIS — C7951 Secondary malignant neoplasm of bone: Secondary | ICD-10-CM | POA: Insufficient documentation

## 2024-02-18 DIAGNOSIS — G893 Neoplasm related pain (acute) (chronic): Secondary | ICD-10-CM

## 2024-02-18 DIAGNOSIS — C787 Secondary malignant neoplasm of liver and intrahepatic bile duct: Secondary | ICD-10-CM | POA: Insufficient documentation

## 2024-02-18 DIAGNOSIS — F1920 Other psychoactive substance dependence, uncomplicated: Secondary | ICD-10-CM

## 2024-02-18 DIAGNOSIS — Z1722 Progesterone receptor negative status: Secondary | ICD-10-CM | POA: Insufficient documentation

## 2024-02-18 LAB — COMPREHENSIVE METABOLIC PANEL WITH GFR
ALT: 19 U/L (ref 0–44)
AST: 24 U/L (ref 15–41)
Albumin: 3.6 g/dL (ref 3.5–5.0)
Alkaline Phosphatase: 79 U/L (ref 38–126)
Anion gap: 10 (ref 5–15)
BUN: 19 mg/dL (ref 6–20)
CO2: 25 mmol/L (ref 22–32)
Calcium: 9.4 mg/dL (ref 8.9–10.3)
Chloride: 103 mmol/L (ref 98–111)
Creatinine, Ser: 0.84 mg/dL (ref 0.44–1.00)
GFR, Estimated: >60 mL/min (ref >60)
Glucose, Bld: 98 mg/dL (ref 70–99)
Potassium: 3.8 mmol/L (ref 3.5–5.1)
Sodium: 138 mmol/L (ref 135–145)
Total Bilirubin: 0.4 mg/dL (ref 0.0–1.2)
Total Protein: 7 g/dL (ref 6.5–8.1)

## 2024-02-18 LAB — CBC WITH DIFFERENTIAL/PLATELET
Abs Immature Granulocytes: 0.02 10*3/uL (ref 0.00–0.07)
Basophils Absolute: 0.1 10*3/uL (ref 0.0–0.1)
Basophils Relative: 1 %
Eosinophils Absolute: 0.2 10*3/uL (ref 0.0–0.5)
Eosinophils Relative: 3 %
HCT: 39.1 % (ref 36.0–46.0)
Hemoglobin: 12.8 g/dL (ref 12.0–15.0)
Immature Granulocytes: 0 %
Lymphocytes Relative: 19 %
Lymphs Abs: 1.2 10*3/uL (ref 0.7–4.0)
MCH: 33 pg (ref 26.0–34.0)
MCHC: 32.7 g/dL (ref 30.0–36.0)
MCV: 100.8 fL — ABNORMAL HIGH (ref 80.0–100.0)
Monocytes Absolute: 0.8 10*3/uL (ref 0.1–1.0)
Monocytes Relative: 13 %
Neutro Abs: 4 10*3/uL (ref 1.7–7.7)
Neutrophils Relative %: 64 %
Platelets: 249 10*3/uL (ref 150–400)
RBC: 3.88 MIL/uL (ref 3.87–5.11)
RDW: 13.5 % (ref 11.5–15.5)
WBC: 6.3 10*3/uL (ref 4.0–10.5)
nRBC: 0 % (ref 0.0–0.2)

## 2024-02-18 LAB — RAD ONC ARIA SESSION SUMMARY
Course Elapsed Days: 0
Plan Fractions Treated to Date: 1
Plan Prescribed Dose Per Fraction: 9 Gy
Plan Total Fractions Prescribed: 3
Plan Total Prescribed Dose: 27 Gy
Reference Point Dosage Given to Date: 9 Gy
Reference Point Session Dosage Given: 9 Gy
Session Number: 1

## 2024-02-18 LAB — RAPID URINE DRUG SCREEN, HOSP PERFORMED
Amphetamines: NOT DETECTED
Barbiturates: NOT DETECTED
Benzodiazepines: POSITIVE — AB
Cocaine: NOT DETECTED
Opiates: NOT DETECTED
Tetrahydrocannabinol: NOT DETECTED

## 2024-02-18 LAB — MAGNESIUM: Magnesium: 1.9 mg/dL (ref 1.7–2.4)

## 2024-02-18 MED ORDER — ACETAMINOPHEN 325 MG PO TABS
650.0000 mg | ORAL_TABLET | Freq: Once | ORAL | Status: AC
Start: 2024-02-18 — End: 2024-02-18
  Administered 2024-02-18: 650 mg via ORAL
  Filled 2024-02-18: qty 2

## 2024-02-18 MED ORDER — ALPRAZOLAM 0.5 MG PO TABS: 0.5000 mg | ORAL_TABLET | Freq: Two times a day (BID) | ORAL | 0 refills | Status: DC | PRN

## 2024-02-18 MED ORDER — FAM-TRASTUZUMAB DERUXTECAN-NXKI CHEMO 100 MG IV SOLR
5.4000 mg/kg | Freq: Once | INTRAVENOUS | Status: AC
  Administered 2024-02-18: 300 mg via INTRAVENOUS
  Filled 2024-02-18: qty 15

## 2024-02-18 MED ORDER — SODIUM CHLORIDE 0.9% FLUSH
10.0000 mL | INTRAVENOUS | Status: DC | PRN
  Administered 2024-02-18: 10 mL

## 2024-02-18 MED ORDER — FAMOTIDINE IN NACL 20-0.9 MG/50ML-% IV SOLN
20.0000 mg | Freq: Once | INTRAVENOUS | Status: AC
  Administered 2024-02-18: 20 mg via INTRAVENOUS
  Filled 2024-02-18: qty 50

## 2024-02-18 MED ORDER — DEXTROSE 5 % IV SOLN
Freq: Once | INTRAVENOUS | Status: AC
Start: 2024-02-18 — End: 2024-02-18

## 2024-02-18 MED ORDER — DEXAMETHASONE SODIUM PHOSPHATE 10 MG/ML IJ SOLN
10.0000 mg | Freq: Once | INTRAMUSCULAR | Status: AC
  Administered 2024-02-18: 10 mg via INTRAVENOUS
  Filled 2024-02-18: qty 1

## 2024-02-18 MED ORDER — HEPARIN SOD (PORK) LOCK FLUSH 100 UNIT/ML IV SOLN
500.0000 [IU] | Freq: Once | INTRAVENOUS | Status: AC | PRN
Start: 2024-02-18 — End: 2024-02-18
  Administered 2024-02-18: 500 [IU]

## 2024-02-18 MED ORDER — PALONOSETRON HCL INJECTION 0.25 MG/5ML
0.2500 mg | Freq: Once | INTRAVENOUS | Status: AC
  Administered 2024-02-18: 0.25 mg via INTRAVENOUS
  Filled 2024-02-18: qty 5

## 2024-02-18 MED ORDER — SODIUM CHLORIDE 0.9% FLUSH
10.0000 mL | INTRAVENOUS | Status: DC | PRN
  Administered 2024-02-18: 10 mL via INTRAVENOUS

## 2024-02-18 MED ORDER — SODIUM CHLORIDE 0.9 % IV SOLN
150.0000 mg | Freq: Once | INTRAVENOUS | Status: AC
  Administered 2024-02-18: 150 mg via INTRAVENOUS
  Filled 2024-02-18: qty 150

## 2024-02-18 NOTE — Patient Instructions (Addendum)
 [facility] Discharge Instructions   You were seen and examined today by Dr. Rogers.  He reviewed the results of your lab work which are normal/stable.   He reviewed the results of your CT scan which does not show any evidence of cancer.   We will proceed with your treatment today.   Return as scheduled.    Thank you for choosing [facility] to provide your oncology and hematology care.  To afford each patient quality time with our provider, please arrive at least 15 minutes before your scheduled appointment time.   If you have a lab appointment with the Cancer Center please come in thru the Main Entrance and check in at the main information desk.  You need to re-schedule your appointment should you arrive 10 or more minutes late.  We strive to give you quality time with our providers, and arriving late affects you and other patients whose appointments are after yours.  Also, if you no show three or more times for appointments you may be dismissed from the clinic at the providers discretion.     Again, thank you for choosing [facility].  Our hope is that these requests will decrease the amount of time that you wait before being seen by our physicians.       _____________________________________________________________  Should you have questions after your visit to [facility], please contact our office at [phone] and follow the prompts.  Our office hours are 8:00 a.m. and 4:30 p.m. Monday - Friday.  Please note that voicemails left after 4:00 p.m. may not be returned until the following business day.  We are closed weekends and major holidays.  You do have access to a nurse 24-7, just call the main number to the clinic [phone] and do not press any options, hold on the line and a nurse will answer the phone.    For prescription refill requests, have your pharmacy contact our  office and allow 72 hours.    Due to Covid, you will need to wear a mask upon entering the hospital. If you do not have a mask, a mask will be given to you at the Main Entrance upon arrival. For doctor visits, patients may have 1 support person age 12 or older with them. For treatment visits, patients can not have anyone with them due to social distancing guidelines and our immunocompromised population.

## 2024-02-18 NOTE — Progress Notes (Signed)
Treatment given per orders. Patient tolerated it well without problems. Vitals stable and discharged home from clinic ambulatory. Follow up as scheduled.  

## 2024-02-18 NOTE — Progress Notes (Addendum)
 Sara [name] over to clinic for 15 min observation for [facility] Brain.  Complaints headaches mainly on right side and fatigue.  Denies vision changes, n/v, and skin irritation.  Gait independent no devices needed.  No Decadron  orders.  Vitals:  97.5-82-18-133/88 O2 sat 100% RA.  Advised not to do anything strenuous over next 24 hours.  Call if need.

## 2024-02-18 NOTE — Op Note (Signed)
 Name: Sara [name]    MRN: [id number]   Date: 02/18/2024    DOB: [birth year]   STEREOTACTIC RADIOSURGERY OPERATIVE NOTE  PRE-OPERATIVE DIAGNOSIS:  Metastatic brain disease  POST-OPERATIVE DIAGNOSIS:  Metastatic brain disease  PROCEDURE:   Stereotactic Radiosurgery using TrueBeam Linac device Stereotactic Radiosurgery using TrueBeam Linac device, additional lesion Stereotactic Radiosurgery using TrueBeam Linac device, additional lesion Stereotactic Radiosurgery using TrueBeam Linac device, additional lesion Stereotactic Radiosurgery using TrueBeam Linac device, additional lesion  SURGEON:  Dorn Ned, MD  RADIATION ONCOLOGIST: Donnice Barge, MD  TECHNIQUE:  The patient underwent a radiation treatment planning session in the radiation oncology simulation suite under the care of the radiation oncology physician and physicist.  I participated closely in the radiation treatment planning afterwards. The patient underwent planning CT which was fused to 3T high resolution MRI with 1 mm axial slices.  These images were fused on the planning system.  We contoured the gross target volumes and subsequently expanded this to yield the Planning Target Volume. I actively participated in the planning process.  I helped to define and review the target contours and also the contours of the optic pathway, eyes, brainstem and selected nearby organs at risk.  All the dose constraints for critical structures were reviewed and compared to AAPM Task Group 101.  The prescription dose conformity was reviewed.  I approved the plan electronically.    Accordingly, Sara [name]  was brought to the TrueBeam stereotactic radiation treatment linac and placed in the custom immobilization mask.  The patient was aligned according to the IR fiducial markers with BrainLab Exactrac, then orthogonal x-rays were used in ExacTrac with the 6DOF robotic table and the shifts were made to align the patient  Sara [name]  received stereotactic radiosurgery to a prescription dose of 9 Gy uneventfully with plan for two additional fractions for a total of 27 Gy.  The detailed description of the procedure is recorded in the radiation oncology procedure note.  I was present for the duration of the procedure.  DISPOSITION:   Following delivery, the patient was transported to nursing in stable condition and monitored for possible acute effects to be discharged to home in stable condition with follow-up in one month.  Dorn Ned, MD [facility]

## 2024-02-18 NOTE — Progress Notes (Signed)
 Patients port flushed without difficulty.  Good blood return noted with no bruising or swelling noted at site.  Patient remains accessed for treatment.

## 2024-02-18 NOTE — Progress Notes (Signed)
 [facility] [address], [place] [address]    Clinic Day:  02/18/2024  Referring physician: Orpha Yancey LABOR, MD  Patient Care Team: Orpha Yancey LABOR, MD as PCP - General (Internal Medicine) Rogers Hai, MD as Medical Oncologist (Medical Oncology) Celestia Joesph SQUIBB, RN as Oncology Nurse Navigator (Oncology)   ASSESSMENT & PLAN:   Assessment: 1. Her2+ metastatic breast cancer: - She reports feeling a knot in the right breast in July 2022.  She felt the same lump in September which has increased slightly in November.  In the last 3 months, it has increased in size from a quarter to size of an orange. - Mammogram/ultrasound on 10/01/2021: Large irregular mass involving the entire central right breast measuring 4.9 x 2.4 x 4 cm at 12:00.  At least 3 lymph nodes in the right axilla.  Overlying skin thickening and generalized erythema and marked tenderness. - Right breast mass and axillary lymph node biopsy on 10/08/2021: - Pathology: Poorly differentiated ductal carcinoma, grade 3, HER2 3+ positive, ER 5% strong staining intensity, PR negative, Ki-67 25%. - PET scan (11/30/2021): Hypermetabolic tumor involving right breast, metastatic mediastinal and hilar lymphadenopathy, hepatic metastatic disease, diffuse lytic bone lesions. - 4 cycles of THP from 12/03/2021 through 02/25/2022 - Cerebellar mass resection (11/12/2022): Metastatic carcinoma with tumor cells positive for CK7, GATA3, indicating breast primary.  ER and CK20 are negative.  HER2 is 3+. - Enhertu  started on 01/01/2023   2. Social/family history: - She is currently living with her father, youngest daughter and sister.  She has 2 daughters ages 60 and 51.  She has worked in Journalist, newspaper previously.  She quit smoking 3 months ago.  Prior to that she smoked a pack a day for 4 to 5 years and less than half pack a day since 2011.  She vapes occasionally at this time. - Paternal great grandmother had  lung cancer.  Paternal aunt had lung cancer at age 45.  Paternal grandmother had breast cancer in her 60s.  Paternal grandfather had colon cancer.  Maternal grandfather had colon cancer.    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  No significant GI side effects noted. - CT CAP (02/11/2024): No evidence of recurrence or metastatic disease.  Other benign findings were discussed with the patient. - Labs today: Normal CBC.  LFTs and creatinine are normal. - She may proceed with Enhertu  today.  RTC 3 weeks for follow-up.   2.  Difficulty falling asleep: - She will continue Remeron  15 mg at bedtime as needed.  3.  Anxiety: - She is taking Xanax  0.5 mg daily as needed.  She reports that it is not helping completely.  Will increase Xanax  to 0.5 mg twice daily.  4.  Chest pain, back pain, right ankle pain: - Continue Percocet 7.5 mg twice daily as needed.  She is using BC powder occasionally.  Urine drug screen was positive for benzos.   5.  High risk drug monitoring: - 2D echo on 08/02/2024: LVEF 60-65%. - Reviewed echo from 02/08/2024: LVEF 60-65%.  6.  Brain metastasis: - MRI brain on 02/02/2024: Multiple new small cerebral metastasis.  Previously seen lesions in the right parietal lobe and caudate not changed significantly. - She is planning to have SRS done under the direction of Dr. Patrcia.    Orders Placed This Encounter  Procedures   Magnesium    Standing  Status:   Future    Expected Date:   03/24/2024    Expiration Date:   03/24/2025   CBC with Differential    Standing Status:   Future    Expected Date:   03/24/2024    Expiration Date:   03/24/2025   Comprehensive metabolic panel    Standing Status:   Future    Expected Date:   03/24/2024    Expiration Date:   03/24/2025      Sara [name],acting as a scribe for Alean Stands, MD.,have documented all relevant documentation on the behalf of Alean Stands, MD,as directed by  Alean Stands, MD while in the presence of Alean Stands, MD.  I, Alean Stands MD, have reviewed the above documentation for accuracy and completeness, and I agree with the above.      Alean Stands, MD   7/3/202512:54 PM  CHIEF COMPLAINT:   Diagnosis: Her2+ metastatic right breast cancer    Cancer Staging  HER2-positive carcinoma of right breast [facility]) Staging form: Breast, AJCC 8th Edition - Clinical stage from 10/28/2021: Stage IV (cT4d, cN1, cM1, G3, ER+, PR-, HER2+) - Unsigned    Prior Therapy: 1. DOCEtaxel  + Trastuzumab  + Pertuzumab  (THP) q21d x 4 cycles  2. SRS to brain metastases-- 10 fractions from 12/05/22 through 12/19/22, 1 fraction on 05/13/23  Current Therapy:  Enhertu     HISTORY OF PRESENT ILLNESS:   Oncology History  HER2-positive carcinoma of right breast (HCC)  10/28/2021 Initial Diagnosis   Breast cancer, right breast (HCC)   12/03/2021 - 02/25/2022 Chemotherapy   Patient is on Treatment Plan : BREAST DOCEtaxel  + Trastuzumab  + Pertuzumab  (THP) q21d x 8 cycles / Trastuzumab  + Pertuzumab  q21d x 4 cycles     01/01/2023 -  Chemotherapy   Patient is on Treatment Plan : BREAST METASTATIC Fam-Trastuzumab Deruxtecan-nxki  (Enhertu ) (5.4) q21d        INTERVAL HISTORY:   Sara [name] is a 48 y.o. female presenting to clinic today for follow up of Her2+ metastatic right breast cancer. She was last seen by me on 01/28/24.  Since her last visit, she underwent CT CAP on 02/11/24 that found: No evidence of recurrent or metastatic disease in the chest, abdomen, or pelvis. Minimal fine ground-glass scattered throughout the upper lobes and mild diffuse bronchial wall thickening, unchanged. 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   Today, she states that she is doing well overall. Her appetite level is at 75%. Her energy level is  at 75%.  Sara [name] states she has radiation therapy today for brain metastasis. She reports vision changes that could be attributable to not wearing her glasses occasionally. She denies any double vision.   Sara [name] notes headaches on the bilateral sides  of the temple and occasionally throughout the head.   She reports SOB that is attributable to anxiety and would like a higher dosage of Xanax , as it is only effective for short periods of time.    PAST MEDICAL HISTORY:   Past Medical History: Past Medical History:  Diagnosis Date   Anxiety    Back pain    Cancer [facility])     Surgical History: Past Surgical History:  Procedure Laterality Date   CHOLECYSTECTOMY     ORTHOPEDIC SURGERY     PORTACATH PLACEMENT Left 11/18/2021   Procedure: INSERTION PORT-A-CATH;  Surgeon: Kallie Manuelita BROCKS, MD;  Location: [facility];  Service: General;  Laterality: Left;   SUBOCCIPITAL  CRANIECTOMY CERVICAL LAMINECTOMY N/A 11/12/2022   Procedure: SUBOCCIPITAL CRANIECTOMY FOR RESECTION OF CEREBELLAR MASS;  Surgeon: Onetha Kuba, MD;  Location: [facility];  Service: Neurosurgery;  Laterality: N/A;    Social History: Social History   Socioeconomic History   Marital status: Legally Separated    Spouse name: Not on file   Number of children: Not on file   Years of education: Not on file   Highest education level: Not on file  Occupational History   Not on file  Tobacco Use   Smoking status: Former    Current packs/day: 0.00    Average packs/day: 1 pack/day for 5.0 years (5.0 ttl pk-yrs)    Types: Cigarettes    Start date: 11/08/2016    Quit date: 11/08/2021    Years since quitting: 2.2   Smokeless tobacco: Never  Vaping Use   Vaping status: Never Used  Substance and Sexual Activity   Alcohol use: No    Comment: occasionally   Drug use: No   Sexual activity: Yes    Birth control/protection: None  Other Topics Concern   Not on file  Social History Narrative   Not on file   Social Drivers of Health    Financial Resource Strain: High Risk (12/03/2021)   Overall Financial Resource Strain (CARDIA)    Difficulty of Paying Living Expenses: Very hard  Food Insecurity: No Food Insecurity (09/17/2023)   Hunger Vital Sign    Worried About Running Out of Food in the Last Year: Never true    Ran Out of Food in the Last Year: Never true  Transportation Needs: No Transportation Needs (11/25/2022)   PRAPARE - Administrator, Civil Service (Medical): No    Lack of Transportation (Non-Medical): No  Recent Concern: Transportation Needs - Unmet Transportation Needs (11/11/2022)   PRAPARE - Transportation    Lack of Transportation (Medical): Yes    Lack of Transportation (Non-Medical): Yes  Physical Activity: Sufficiently Active (06/17/2021)   Exercise Vital Sign    Days of Exercise per Week: 5 days    Minutes of Exercise per Session: 30 min  Stress: Stress Concern Present (06/17/2021)   [employer] of Occupational Health - Occupational Stress Questionnaire    Feeling of Stress : Rather much  Social Connections: Socially Isolated (06/17/2021)   Social Connection and Isolation Panel    Frequency of Communication with Friends and Family: Twice a week    Frequency of Social Gatherings with Friends and Family: Never    Attends Religious Services: 1 to 4 times per year    Active Member of [employer] or Organizations: No    Attends Banker Meetings: Never    Marital Status: Divorced  Catering manager Violence: Not At Risk (09/17/2023)   Humiliation, Afraid, Rape, and Kick questionnaire    Fear of Current or Ex-Partner: No    Emotionally Abused: No    Physically Abused: No    Sexually Abused: No    Family History: Family History  Problem Relation Age of Onset   Heart disease Brother    Lung cancer Paternal Grandmother        lung   Cancer Paternal Grandfather    Asthma Daughter    Asthma Daughter     Current Medications:  Current Outpatient Medications:    docusate  sodium (COLACE) 100 MG capsule, Take 1 capsule (100 mg total) by mouth 2 (two) times daily., Disp: , Rfl:    furosemide  (LASIX ) 20 MG tablet, Take 1  tablet (20 mg total) by mouth daily as needed., Disp: 30 tablet, Rfl: 0   lidocaine -prilocaine  (EMLA ) cream, Apply 1 Application topically as needed., Disp: 30 g, Rfl: 0   mirtazapine  (REMERON ) 15 MG tablet, Take 1 tablet (15 mg total) by mouth at bedtime., Disp: 30 tablet, Rfl: 2   ondansetron  (ZOFRAN ) 8 MG tablet, TAKE 1 TABLET BY MOUTH EVERY 8 HOURS AS NEEDED FOR NAUSEA FOR VOMITING, Disp: 90 tablet, Rfl: 5   oxyCODONE -acetaminophen  (PERCOCET) 7.5-325 MG tablet, Take 1 tablet by mouth every 6 (six) hours as needed for severe pain (pain score 7-10)., Disp: 84 tablet, Rfl: 0   pantoprazole  (PROTONIX ) 20 MG tablet, Take 1 tablet (20 mg total) by mouth daily., Disp: 30 tablet, Rfl: 5   prochlorperazine  (COMPAZINE ) 10 MG tablet, Take 1 tablet (10 mg total) by mouth every 6 (six) hours as needed for nausea or vomiting., Disp: 30 tablet, Rfl: 5   ALPRAZolam  (XANAX ) 0.5 MG tablet, Take 1 tablet (0.5 mg total) by mouth 2 (two) times daily as needed for anxiety. TAKE 1 TABLET BY MOUTH AT BEDTIME AS NEEDED FOR ANXIETY, Disp: 60 tablet, Rfl: 0   Allergies: Allergies  Allergen Reactions   Hydrocodone  Itching, Nausea And Vomiting, Rash and Other (See Comments)    Hallucinations when given vicodin post crani in 2024   Sulfa Antibiotics Nausea And Vomiting   Morphine  And Codeine Rash    REVIEW OF SYSTEMS:   Review of Systems  Constitutional:  Negative for chills, fatigue and fever.       +pain throughout the body, 8/10 severity  HENT:   Negative for lump/mass, mouth sores, nosebleeds, sore throat and trouble swallowing.   Eyes:  Negative for eye problems.  Respiratory:  Negative for cough and shortness of breath.   Cardiovascular:  Positive for chest pain. Negative for leg swelling and palpitations.  Gastrointestinal:  Positive for diarrhea, nausea and  vomiting. Negative for abdominal pain and constipation.  Genitourinary:  Negative for bladder incontinence, difficulty urinating, dysuria, frequency, hematuria and nocturia.   Musculoskeletal:  Negative for arthralgias, back pain, flank pain, myalgias and neck pain.  Skin:  Negative for itching and rash.  Neurological:  Negative for dizziness, headaches and numbness.  Hematological:  Does not bruise/bleed easily.  Psychiatric/Behavioral:  Positive for depression. Negative for sleep disturbance and suicidal ideas. The patient is nervous/anxious.   All other systems reviewed and are negative.    VITALS:   There were no vitals taken for this visit.  Wt Readings from Last 3 Encounters:  02/18/24 127 lb 6.8 oz (57.8 kg)  02/05/24 130 lb (59 kg)  02/05/24 130 lb (59 kg)    There is no height or weight on file to calculate BMI.  Performance status (ECOG): 1 - Symptomatic but completely ambulatory  PHYSICAL EXAM:   Physical Exam Vitals and nursing note reviewed. Exam conducted with a chaperone present.  Constitutional:      Appearance: Normal appearance.  Cardiovascular:     Rate and Rhythm: Normal rate and regular rhythm.     Pulses: Normal pulses.     Heart sounds: Normal heart sounds.  Pulmonary:     Effort: Pulmonary effort is normal.     Breath sounds: Normal breath sounds.  Abdominal:     Palpations: Abdomen is soft. There is no hepatomegaly, splenomegaly or mass.     Tenderness: There is no abdominal tenderness.  Musculoskeletal:     Right lower leg: No edema.     Left  lower leg: No edema.  Lymphadenopathy:     Cervical: No cervical adenopathy.     Right cervical: No superficial, deep or posterior cervical adenopathy.    Left cervical: No superficial, deep or posterior cervical adenopathy.     Upper Body:     Right upper body: No supraclavicular or axillary adenopathy.     Left upper body: No supraclavicular or axillary adenopathy.  Neurological:     General: No focal  deficit present.     Mental Status: She is alert and oriented to person, place, and time.  Psychiatric:        Mood and Affect: Mood normal.        Behavior: Behavior normal.     LABS:      Latest Ref Rng & Units 02/18/2024    8:10 AM 01/28/2024   10:15 AM 01/07/2024   10:12 AM  CBC  WBC 4.0 - 10.5 K/uL 6.3  5.0  6.9   Hemoglobin 12.0 - 15.0 g/dL 87.1  88.4  88.4   Hematocrit 36.0 - 46.0 % 39.1  36.0  35.8   Platelets 150 - 400 K/uL 249  293  229       Latest Ref Rng & Units 02/18/2024    8:10 AM 01/28/2024   10:15 AM 01/07/2024   10:12 AM  CMP  Glucose 70 - 99 mg/dL 98  893  93   BUN 6 - 20 mg/dL 19  16  23    Creatinine 0.44 - 1.00 mg/dL 9.15  9.29  9.27   Sodium 135 - 145 mmol/L 138  139  137   Potassium 3.5 - 5.1 mmol/L 3.8  3.9  3.9   Chloride 98 - 111 mmol/L 103  106  104   CO2 22 - 32 mmol/L 25  24  25    Calcium 8.9 - 10.3 mg/dL 9.4  8.9  9.1   Total Protein 6.5 - 8.1 g/dL 7.0  6.7  6.6   Total Bilirubin 0.0 - 1.2 mg/dL 0.4  0.5  0.4   Alkaline Phos 38 - 126 U/L 79  118  72   AST 15 - 41 U/L 24  46  20   ALT 0 - 44 U/L 19  90  14      No results found for: CEA1, CEA / No results found for: CEA1, CEA No results found for: PSA1 No results found for: CAN199 No results found for: CAN125  No results found for: TOTALPROTELP, ALBUMINELP, A1GS, A2GS, BETS, BETA2SER, GAMS, MSPIKE, SPEI No results found for: TIBC, FERRITIN, IRONPCTSAT No results found for: LDH   STUDIES:   CT CHEST ABDOMEN PELVIS W CONTRAST Result Date: 02/11/2024 CLINICAL DATA:  Metastatic breast cancer restaging * Tracking Code: BO * EXAM: CT CHEST, ABDOMEN, AND PELVIS WITH CONTRAST TECHNIQUE: Multidetector CT imaging of the chest, abdomen and pelvis was performed following the standard protocol during bolus administration of intravenous contrast. RADIATION DOSE REDUCTION: This exam was performed according to the departmental dose-optimization program which includes  automated exposure control, adjustment of the mA and/or kV according to patient size and/or use of iterative reconstruction technique. CONTRAST:  OMNIPAQUE  IOHEXOL  300 MG/ML  SOLN COMPARISON:  09/28/2023 FINDINGS: CT CHEST FINDINGS Cardiovascular: Left chest port catheter. Normal heart size. No pericardial effusion. Mediastinum/Nodes: No enlarged mediastinal, hilar, or axillary lymph nodes. Thyroid gland, trachea, and esophagus demonstrate no significant findings. Lungs/Pleura: Minimal fine ground-glass scattered throughout the upper lobes and similar to prior (series 3, image 58).  Mild diffuse bilateral bronchial wall thickening. No pleural effusion or pneumothorax. Musculoskeletal: No chest wall abnormality. No acute osseous findings. CT ABDOMEN PELVIS FINDINGS Hepatobiliary: No focal liver abnormality is seen. Status post cholecystectomy. Unchanged postoperative biliary ductal dilatation. Pancreas: Unremarkable. No pancreatic ductal dilatation or surrounding inflammatory changes. Spleen: Normal in size without significant abnormality. Adrenals/Urinary Tract: Adrenal glands are unremarkable. Kidneys are normal, without renal calculi, solid lesion, or hydronephrosis. Bladder is unremarkable. Stomach/Bowel: Stomach is within normal limits. 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 No evidence of bowel wall thickening, distention, or inflammatory changes. Vascular/Lymphatic: Prominent left ovarian and adnexal varices (series 2, image 107) with a prominent left ovarian vein, measuring up to 1.2 cm in caliber near the confluence with the renal vein (series 2, image 70). No enlarged abdominal or pelvic lymph nodes. Reproductive: No mass or other abnormality. Other: No abdominal wall hernia or abnormality. No ascites. Musculoskeletal: No acute osseous findings. No CT correlate for previously identified FDG avid osseous metastatic disease.  IMPRESSION: 1. No evidence of recurrent or metastatic disease in the chest, abdomen, or pelvis. 2. Minimal fine ground-glass scattered throughout the upper lobes and mild diffuse bronchial wall thickening, unchanged. 3. 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 4. Prominent left ovarian and adnexal varices with a prominent left ovarian vein. This can be seen in the setting of pelvic congestion if clinically referable signs and symptoms are present. Electronically Signed   By: Marolyn JONETTA Jaksch M.D.   On: 02/11/2024 21:46   ECHOCARDIOGRAM COMPLETE Result Date: 02/08/2024    ECHOCARDIOGRAM REPORT   Patient Name:   Sara [name] Date of Exam: 02/08/2024 Medical Rec #:  [id number]       Height:       64.0 in Accession #:    [id number]      Weight:       130.0 lb Date of Birth:  1975/11/04       BSA:          1.629 m Patient Age:    48 years        BP:           124/85 mmHg Patient Gender: F               HR:           85 bpm. Exam Location:  [facility] Procedure: 2D Echo, Cardiac Doppler, Color Doppler and Strain Analysis (Both            Spectral and Color Flow Doppler were utilized during procedure). Indications:    Chemo Z09  History:        Patient has prior history of Echocardiogram examinations, most                 recent 08/03/2023. Breast cancer metastasized to brain, right                 (HCC).  Sonographer:    Aida Pizza RCS Referring Phys: [id number] [facility]  Sonographer Comments: Global longitudinal strain was attempted. IMPRESSIONS  1. Left ventricular ejection fraction, by estimation, is 60 to 65%. The left ventricle has normal function. The left ventricle has no regional wall motion abnormalities. Left ventricular diastolic parameters were normal.  2. Right ventricular systolic function is normal. The right ventricular size is normal.  3. The mitral valve is normal in structure. No evidence of mitral valve regurgitation. No evidence of mitral stenosis.  4.  The aortic  valve is tricuspid. Aortic valve regurgitation is not visualized. No aortic stenosis is present.  5. The inferior vena cava is normal in size with greater than 50% respiratory variability, suggesting right atrial pressure of 3 mmHg. FINDINGS  Left Ventricle: Left ventricular ejection fraction, by estimation, is 60 to 65%. The left ventricle has normal function. The left ventricle has no regional wall motion abnormalities. Strain was performed and the global longitudinal strain is indeterminate. The left ventricular internal cavity size was normal in size. There is no left ventricular hypertrophy. Left ventricular diastolic parameters were normal. Right Ventricle: The right ventricular size is normal. Right vetricular wall thickness was not well visualized. Right ventricular systolic function is normal. Left Atrium: Left atrial size was normal in size. Right Atrium: Right atrial size was normal in size. Pericardium: There is no evidence of pericardial effusion. Mitral Valve: The mitral valve is normal in structure. No evidence of mitral valve regurgitation. No evidence of mitral valve stenosis. Tricuspid Valve: The tricuspid valve is normal in structure. Tricuspid valve regurgitation is trivial. No evidence of tricuspid stenosis. Aortic Valve: The aortic valve is tricuspid. Aortic valve regurgitation is not visualized. No aortic stenosis is present. Aortic valve mean gradient measures 3.9 mmHg. Aortic valve peak gradient measures 8.3 mmHg. Aortic valve area, by VTI measures 2.06 cm. Pulmonic Valve: The pulmonic valve was not well visualized. Pulmonic valve regurgitation is not visualized. No evidence of pulmonic stenosis. Aorta: The aortic root and ascending aorta are structurally normal, with no evidence of dilitation. Venous: The inferior vena cava is normal in size with greater than 50% respiratory variability, suggesting right atrial pressure of 3 mmHg. IAS/Shunts: No atrial level shunt detected by color flow  Doppler.  LEFT VENTRICLE PLAX 2D LVIDd:         4.20 cm   Diastology LVIDs:         2.20 cm   LV e' medial:    11.50 cm/s LV PW:         0.80 cm   LV E/e' medial:  7.2 LV IVS:        1.00 cm   LV e' lateral:   16.20 cm/s LVOT diam:     1.70 cm   LV E/e' lateral: 5.1 LV SV:         51 LV SV Index:   31 LVOT Area:     2.27 cm  RIGHT VENTRICLE RV S prime:     13.20 cm/s TAPSE (M-mode): 2.0 cm LEFT ATRIUM             Index        RIGHT ATRIUM          Index LA diam:        3.30 cm 2.03 cm/m   RA Area:     9.96 cm LA Vol (A2C):   30.5 ml 18.72 ml/m  RA Volume:   18.90 ml 11.60 ml/m LA Vol (A4C):   26.2 ml 16.08 ml/m LA Biplane Vol: 30.2 ml 18.54 ml/m  AORTIC VALVE AV Area (Vmax):    1.98 cm AV Area (Vmean):   2.07 cm AV Area (VTI):     2.06 cm AV Vmax:           144.25 cm/s AV Vmean:          91.793 cm/s AV VTI:            0.249 m AV Peak Grad:      8.3  mmHg AV Mean Grad:      3.9 mmHg LVOT Vmax:         126.00 cm/s LVOT Vmean:        83.700 cm/s LVOT VTI:          0.226 m LVOT/AV VTI ratio: 0.91  AORTA Ao Root diam: 2.60 cm Ao Asc diam:  2.70 cm MITRAL VALVE MV Area (PHT): 4.21 cm    SHUNTS MV Decel Time: 180 msec    Systemic VTI:  0.23 m MV E velocity: 83.10 cm/s  Systemic Diam: 1.70 cm MV A velocity: 62.90 cm/s MV E/A ratio:  1.32 Dorn Ross MD Electronically signed by Dorn Ross MD Signature Date/Time: 02/08/2024/12:39:18 PM    Final    MR Brain W Wo Contrast Result Date: 02/02/2024 CLINICAL DATA:  Brain metastases, assess treatment response 3T SRS Protocol EXAM: MRI HEAD WITHOUT AND WITH CONTRAST TECHNIQUE: Multiplanar, multiecho pulse sequences of the brain and surrounding structures were obtained without and with intravenous contrast. CONTRAST:  6mL GADAVIST  GADOBUTROL  1 MMOL/ML IV SOLN COMPARISON:  MRI of the brain dated September 14, 2023. FINDINGS: Brain: Postoperative encephalomalacia changes are again demonstrated within the right cerebellar hemisphere and are unchanged in the interim.  There is no apparent residual recurrent metastatic disease in the right cerebellum. There is a new 3 mm nodular lesion present in the cortex of the right parietal lobe seen on image 237 of series 1100. There is also a new nodular lesion present medially within the left parietal lobe on image 247. There also appears to be a new lesion within the left medial temporal lobe on image 172, which is also supported on image 21 of coronal series number 9. There are new small nodular foci also present within the right occipital lobe on image 191, which are also evident on image 177 of the sagittal series 11. The lesion previously described within the right medial parietal lobe is again demonstrated on image 226 of series 1100. A lesion previously noted within the right caudate nucleus is less conspicuous, but again demonstrated image 188. Vascular: Normal vascular flow voids. Skull and upper cervical spine: Normal bone marrow signal. No osseous lesions are evident. Status post right occipital craniotomy. Sinuses/Orbits: No acute abnormality. Other: None. IMPRESSION: 1. There are multiple new small cerebral metastases present. The previously demonstrated metastatic lesions within the right parietal lobe and caudate evident not changed significantly. 2. Stable postoperative encephalomalacia changes within the right cerebellar hemisphere. Electronically Signed   By: Evalene Coho M.D.   On: 02/02/2024 16:54

## 2024-02-18 NOTE — Patient Instructions (Signed)
 [facility] [place] [facility]  Discharge Instructions: Thank you for choosing [facility] to provide your oncology and hematology care.  If you have a lab appointment with the Cancer Center - please note that after April 8th, 2024, all labs will be drawn in the cancer center.  You do not have to check in or register with the main entrance as you have in the past but will complete your check-in in the cancer center.  Wear comfortable clothing and clothing appropriate for easy access to any Portacath or PICC line.   We strive to give you quality time with your provider. You may need to reschedule your appointment if you arrive late (15 or more minutes).  Arriving late affects you and other patients whose appointments are after yours.  Also, if you miss three or more appointments without notifying the office, you may be dismissed from the clinic at the provider's discretion.      For prescription refill requests, have your pharmacy contact our office and allow 72 hours for refills to be completed.    Today you received the following chemotherapy and/or immunotherapy agents Enhertu   To help prevent nausea and vomiting after your treatment, we encourage you to take your nausea medication as directed.  BELOW ARE SYMPTOMS THAT SHOULD BE REPORTED IMMEDIATELY: *FEVER GREATER THAN 100.4 F (38 C) OR HIGHER *CHILLS OR SWEATING *NAUSEA AND VOMITING THAT IS NOT CONTROLLED WITH YOUR NAUSEA MEDICATION *UNUSUAL SHORTNESS OF BREATH *UNUSUAL BRUISING OR BLEEDING *URINARY PROBLEMS (pain or burning when urinating, or frequent urination) *BOWEL PROBLEMS (unusual diarrhea, constipation, pain near the anus) TENDERNESS IN MOUTH AND THROAT WITH OR WITHOUT PRESENCE OF ULCERS (sore throat, sores in mouth, or a toothache) UNUSUAL RASH, SWELLING OR PAIN  UNUSUAL VAGINAL DISCHARGE OR ITCHING   Items with * indicate a potential emergency and should be followed up as  soon as possible or go to the Emergency Department if any problems should occur.  Please show the CHEMOTHERAPY ALERT CARD or IMMUNOTHERAPY ALERT CARD at check-in to the Emergency Department and triage nurse.  Should you have questions after your visit or need to cancel or reschedule your appointment, please contact [facility] [place] - A DEPT OF [facility] [phone]  and follow the prompts.  Office hours are 8:00 a.m. to 4:30 p.m. Monday - Friday. Please note that voicemails left after 4:00 p.m. may not be returned until the following business day.  We are closed weekends and major holidays. You have access to a nurse at all times for urgent questions. Please call the main number to the clinic [phone] and follow the prompts.  For any non-urgent questions, you may also contact your provider using MyChart. We now offer e-Visits for anyone 41 and older to request care online for non-urgent symptoms. For details visit [email or web address].   Also download the MyChart app! Go to the app store, search "MyChart", open the app, select , and log in with your MyChart username and password.

## 2024-02-20 NOTE — Progress Notes (Signed)
  Radiation Oncology         [phone] ________________________________  Stereotactic Treatment Procedure Note  Name: Sara [name] MRN: [id number]  Date: 02/18/2024  DOB: Jul 06, 1976  SPECIAL TREATMENT PROCEDURE  No diagnosis found.  3D TREATMENT PLANNING AND DOSIMETRY:  The patient's radiation plan was reviewed and approved by neurosurgery and radiation oncology prior to treatment.  It showed 3-dimensional radiation distributions overlaid onto the planning CT/MRI image set.  The DVH's for the target structures as well as the organs at risk were reviewed. The documentation of the 3D plan and dosimetry are filed in the radiation oncology EMR.  NARRATIVE:  Sara [name] was brought to the TrueBeam stereotactic radiation treatment machine and placed supine on the CT couch. The head frame was applied, and the patient was set up for stereotactic radiosurgery.  Neurosurgery was present for the set-up and delivery  SIMULATION VERIFICATION:  In the couch zero-angle position, the patient underwent Exactrac imaging using the Brainlab system with orthogonal KV images.  These were carefully aligned and repeated to confirm treatment position for each of the isocenters.  The Exactrac snap film verification was repeated at each couch angle.  PROCEDURE: Sara [name] received stereotactic radiosurgery to the following targets:   Using one isocenter, these targets were treated using 11 Dynamic Conformal Arcs to a fractional prescription dose of 9 Gy to be repeated a total of 3 times to a total prescription dose of 27 Gy.  ExacTrac registration was performed for each couch angle.  The 100% isodose line was prescribed.  6 MV X-rays were delivered in the flattening filter free beam mode.   STEREOTACTIC TREATMENT MANAGEMENT:  Following delivery, the patient was transported to nursing in stable condition and monitored for possible acute effects.  Vital signs were recorded. The patient tolerated treatment  without significant acute effects, and was discharged to home in stable condition.    PLAN: Follow-up in one month.  ________________________________  Sara [name], M.D.

## 2024-02-22 ENCOUNTER — Ambulatory Visit: Payer: MEDICAID | Admitting: Radiation Oncology

## 2024-02-22 ENCOUNTER — Encounter: Payer: Self-pay | Admitting: Hematology

## 2024-02-22 MED ORDER — OXYCODONE-ACETAMINOPHEN 7.5-325 MG PO TABS: 1.0000 | ORAL_TABLET | Freq: Four times a day (QID) | ORAL | 0 refills | Status: DC | PRN

## 2024-02-24 ENCOUNTER — Ambulatory Visit
Admission: RE | Admit: 2024-02-24 | Discharge: 2024-02-24 | Disposition: A | Discharge status: Home / Self Care | Payer: MEDICAID | Source: Ambulatory Visit | Attending: Radiation Oncology | Admitting: Radiation Oncology

## 2024-02-24 ENCOUNTER — Other Ambulatory Visit: Payer: Self-pay

## 2024-02-24 ENCOUNTER — Encounter: Payer: Self-pay | Admitting: *Deleted

## 2024-02-24 VITALS — BP 122/81 | HR 81 | Temp 98.5°F | Resp 18

## 2024-02-24 DIAGNOSIS — C50911 Malignant neoplasm of unspecified site of right female breast: Secondary | ICD-10-CM

## 2024-02-24 DIAGNOSIS — Z5112 Encounter for antineoplastic immunotherapy: Secondary | ICD-10-CM | POA: Diagnosis not present

## 2024-02-24 LAB — RAD ONC ARIA SESSION SUMMARY
Course Elapsed Days: 6
Plan Fractions Treated to Date: 2
Plan Prescribed Dose Per Fraction: 9 Gy
Plan Total Fractions Prescribed: 3
Plan Total Prescribed Dose: 27 Gy
Reference Point Dosage Given to Date: 18 Gy
Reference Point Session Dosage Given: 9 Gy
Session Number: 2

## 2024-02-24 NOTE — Progress Notes (Signed)
 Sara [name] rested with us  for 15 minutes following her SRS treatment.  Patient denies headache, dizziness, nausea, diplopia or ringing in the ears. Denies fatigue. Patient without complaints. Understands to avoid strenuous activity for the next 24 hours and call [phone] with needs.    BP 122/81   Pulse 81   Temp 98.5 F (36.9 C)   Resp 18   SpO2 100%

## 2024-02-25 ENCOUNTER — Other Ambulatory Visit: Payer: Self-pay | Admitting: *Deleted

## 2024-02-25 MED ORDER — ALUMINUM & MAGNESIUM HYDROXIDE 200-200 MG/5ML PO SUSP: 15.0000 mL | Freq: Four times a day (QID) | ORAL | 2 refills | Status: AC | PRN

## 2024-02-25 MED ORDER — LIDOCAINE VISCOUS HCL 2 % MT SOLN: 15.0000 mL | Freq: Four times a day (QID) | OROMUCOSAL | 2 refills | Status: DC | PRN

## 2024-02-26 ENCOUNTER — Inpatient Hospital Stay: Payer: MEDICAID

## 2024-02-26 ENCOUNTER — Ambulatory Visit
Admission: RE | Admit: 2024-02-26 | Discharge: 2024-02-26 | Disposition: A | Discharge status: Home / Self Care | Payer: MEDICAID | Source: Ambulatory Visit | Attending: Radiation Oncology | Admitting: Radiation Oncology

## 2024-02-26 ENCOUNTER — Other Ambulatory Visit: Payer: Self-pay

## 2024-02-26 VITALS — BP 147/99 | HR 87 | Temp 97.6°F | Resp 18

## 2024-02-26 DIAGNOSIS — Z5112 Encounter for antineoplastic immunotherapy: Secondary | ICD-10-CM | POA: Diagnosis not present

## 2024-02-26 DIAGNOSIS — C50911 Malignant neoplasm of unspecified site of right female breast: Secondary | ICD-10-CM

## 2024-02-26 LAB — RAD ONC ARIA SESSION SUMMARY
Course Elapsed Days: 8
Plan Fractions Treated to Date: 3
Plan Prescribed Dose Per Fraction: 9 Gy
Plan Total Fractions Prescribed: 3
Plan Total Prescribed Dose: 27 Gy
Reference Point Dosage Given to Date: 27 Gy
Reference Point Session Dosage Given: 9 Gy
Session Number: 3

## 2024-02-26 NOTE — Progress Notes (Signed)
 Sara [name] o nursing for 15 minute observation 3/3 SRS Brain treatment.  Denies ringing in ears, vision changes, and nausea/vomiting.  Reports some occasional headaches and fatigue.  Speech clear.  Gait independent without assistive devices.  Advised to keep all follow-up appointment with Dr. Sybil.  Advised not to do anything strenuous for next 24 hours.  Call if need.  Vitals:  97.6-87-18-147/99 O2 sat 98% RA.

## 2024-02-26 NOTE — Addendum Note (Signed)
 Encounter addended by: Claudene Odella SAILOR, RN on: 02/26/2024 12:05 PM  Actions taken: Vitals modified

## 2024-02-26 NOTE — Addendum Note (Signed)
 Encounter addended by: Claudene Odella SAILOR, RN on: 02/26/2024 12:21 PM  Actions taken: Clinical Note Signed

## 2024-02-29 NOTE — Radiation Completion Notes (Addendum)
  Radiation Oncology         [phone] ________________________________  Referring Physician: YANCEY RENO, M.D. Date of Service: 2024-02-29 Radiation Oncologist: Adina Barge, M.D. 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 She tolerated the treatments well and was discharged home in stable condition following each treatment.  The patient will receive a call in about one month from the radiation oncology department.  We will arrange for a posttreatment MRI brain scan in approximately 3 months and plan to call her with those results as soon as they are available.  She knows that she is welcome to call at anytime in the interim with any questions or concerns.  She will continue follow up with her medical oncologist, Dr. Davonna, as well.  ------------------------------------------------   Donnice Barge, MD [facility]  Radiation Oncology Direct Dial: [phone]  Fax: [phone] [email or web address]  Skype  [email or web address]

## 2024-03-10 ENCOUNTER — Inpatient Hospital Stay: Payer: MEDICAID | Attending: Hematology

## 2024-03-10 ENCOUNTER — Inpatient Hospital Stay: Payer: MEDICAID

## 2024-03-10 ENCOUNTER — Inpatient Hospital Stay: Payer: MEDICAID | Admitting: Hematology

## 2024-03-14 ENCOUNTER — Inpatient Hospital Stay (HOSPITAL_BASED_OUTPATIENT_CLINIC_OR_DEPARTMENT_OTHER): Payer: MEDICAID | Admitting: Oncology

## 2024-03-14 ENCOUNTER — Inpatient Hospital Stay: Payer: MEDICAID

## 2024-03-14 ENCOUNTER — Encounter: Payer: Self-pay | Admitting: Hematology

## 2024-03-14 VITALS — BP 136/86 | HR 87 | Temp 97.7°F | Resp 18

## 2024-03-14 DIAGNOSIS — C7931 Secondary malignant neoplasm of brain: Secondary | ICD-10-CM

## 2024-03-14 DIAGNOSIS — C50911 Malignant neoplasm of unspecified site of right female breast: Secondary | ICD-10-CM

## 2024-03-14 DIAGNOSIS — I159 Secondary hypertension, unspecified: Secondary | ICD-10-CM

## 2024-03-14 DIAGNOSIS — F419 Anxiety disorder, unspecified: Secondary | ICD-10-CM | POA: Diagnosis not present

## 2024-03-14 DIAGNOSIS — G893 Neoplasm related pain (acute) (chronic): Secondary | ICD-10-CM

## 2024-03-14 DIAGNOSIS — I10 Essential (primary) hypertension: Secondary | ICD-10-CM | POA: Insufficient documentation

## 2024-03-14 DIAGNOSIS — Z1731 Human epidermal growth factor receptor 2 positive status: Secondary | ICD-10-CM | POA: Diagnosis not present

## 2024-03-14 DIAGNOSIS — Z5112 Encounter for antineoplastic immunotherapy: Secondary | ICD-10-CM | POA: Diagnosis not present

## 2024-03-14 LAB — COMPREHENSIVE METABOLIC PANEL WITH GFR
ALT: 36 U/L (ref 0–44)
AST: 36 U/L (ref 15–41)
Albumin: 3.7 g/dL (ref 3.5–5.0)
Alkaline Phosphatase: 74 U/L (ref 38–126)
Anion gap: 6 (ref 5–15)
BUN: 21 mg/dL — ABNORMAL HIGH (ref 6–20)
CO2: 25 mmol/L (ref 22–32)
Calcium: 8.9 mg/dL (ref 8.9–10.3)
Chloride: 104 mmol/L (ref 98–111)
Creatinine, Ser: 0.74 mg/dL (ref 0.44–1.00)
GFR, Estimated: >60 mL/min (ref >60)
Glucose, Bld: 87 mg/dL (ref 70–99)
Potassium: 3.6 mmol/L (ref 3.5–5.1)
Sodium: 135 mmol/L (ref 135–145)
Total Bilirubin: 0.3 mg/dL (ref 0.0–1.2)
Total Protein: 6.9 g/dL (ref 6.5–8.1)

## 2024-03-14 LAB — CBC WITH DIFFERENTIAL/PLATELET
Abs Immature Granulocytes: 0.01 K/uL (ref 0.00–0.07)
Basophils Absolute: 0.1 K/uL (ref 0.0–0.1)
Basophils Relative: 1 %
Eosinophils Absolute: 0.1 K/uL (ref 0.0–0.5)
Eosinophils Relative: 2 %
HCT: 37.6 % (ref 36.0–46.0)
Hemoglobin: 12.6 g/dL (ref 12.0–15.0)
Immature Granulocytes: 0 %
Lymphocytes Relative: 27 %
Lymphs Abs: 1.7 K/uL (ref 0.7–4.0)
MCH: 33.3 pg (ref 26.0–34.0)
MCHC: 33.5 g/dL (ref 30.0–36.0)
MCV: 99.5 fL (ref 80.0–100.0)
Monocytes Absolute: 0.6 K/uL (ref 0.1–1.0)
Monocytes Relative: 10 %
Neutro Abs: 3.7 K/uL (ref 1.7–7.7)
Neutrophils Relative %: 60 %
Platelets: 225 K/uL (ref 150–400)
RBC: 3.78 MIL/uL — ABNORMAL LOW (ref 3.87–5.11)
RDW: 13.8 % (ref 11.5–15.5)
WBC: 6.2 K/uL (ref 4.0–10.5)
nRBC: 0 % (ref 0.0–0.2)

## 2024-03-14 LAB — MAGNESIUM: Magnesium: 2.2 mg/dL (ref 1.7–2.4)

## 2024-03-14 LAB — RAPID URINE DRUG SCREEN, HOSP PERFORMED
Amphetamines: NOT DETECTED
Barbiturates: NOT DETECTED
Benzodiazepines: POSITIVE — AB
Cocaine: NOT DETECTED
Opiates: NOT DETECTED
Tetrahydrocannabinol: NOT DETECTED

## 2024-03-14 MED ORDER — DEXTROSE 5 % IV SOLN
Freq: Once | INTRAVENOUS | Status: AC
Start: 2024-03-14 — End: 2024-03-14

## 2024-03-14 MED ORDER — AMLODIPINE BESYLATE 10 MG PO TABS: 10.0000 mg | ORAL_TABLET | Freq: Every day | ORAL | 2 refills | Status: DC

## 2024-03-14 MED ORDER — OXYCODONE-ACETAMINOPHEN 7.5-325 MG PO TABS: 1.0000 | ORAL_TABLET | Freq: Four times a day (QID) | ORAL | 0 refills | Status: DC | PRN

## 2024-03-14 MED ORDER — SODIUM CHLORIDE 0.9% FLUSH
10.0000 mL | INTRAVENOUS | Status: DC | PRN
  Administered 2024-03-14: 10 mL via INTRAVENOUS

## 2024-03-14 MED ORDER — SODIUM CHLORIDE 0.9% FLUSH
10.0000 mL | INTRAVENOUS | Status: DC | PRN
  Administered 2024-03-14: 10 mL

## 2024-03-14 MED ORDER — HEPARIN SOD (PORK) LOCK FLUSH 100 UNIT/ML IV SOLN
500.0000 [IU] | Freq: Once | INTRAVENOUS | Status: AC | PRN
  Administered 2024-03-14: 500 [IU]

## 2024-03-14 MED ORDER — DEXAMETHASONE SODIUM PHOSPHATE 10 MG/ML IJ SOLN
10.0000 mg | Freq: Once | INTRAMUSCULAR | Status: AC
  Administered 2024-03-14: 10 mg via INTRAVENOUS
  Filled 2024-03-14: qty 1

## 2024-03-14 MED ORDER — FAM-TRASTUZUMAB DERUXTECAN-NXKI CHEMO 100 MG IV SOLR
5.4000 mg/kg | Freq: Once | INTRAVENOUS | Status: AC
  Administered 2024-03-14: 300 mg via INTRAVENOUS
  Filled 2024-03-14: qty 15

## 2024-03-14 MED ORDER — FAMOTIDINE IN NACL 20-0.9 MG/50ML-% IV SOLN
20.0000 mg | Freq: Once | INTRAVENOUS | Status: AC
  Administered 2024-03-14: 20 mg via INTRAVENOUS
  Filled 2024-03-14: qty 50

## 2024-03-14 MED ORDER — SODIUM CHLORIDE 0.9 % IV SOLN
150.0000 mg | Freq: Once | INTRAVENOUS | Status: AC
  Administered 2024-03-14: 150 mg via INTRAVENOUS
  Filled 2024-03-14: qty 150

## 2024-03-14 MED ORDER — PALONOSETRON HCL INJECTION 0.25 MG/5ML
0.2500 mg | Freq: Once | INTRAVENOUS | Status: AC
  Administered 2024-03-14: 0.25 mg via INTRAVENOUS
  Filled 2024-03-14: qty 5

## 2024-03-14 MED ORDER — ACETAMINOPHEN 325 MG PO TABS
650.0000 mg | ORAL_TABLET | Freq: Once | ORAL | Status: AC
  Administered 2024-03-14: 650 mg via ORAL
  Filled 2024-03-14: qty 2

## 2024-03-14 NOTE — Assessment & Plan Note (Addendum)
 Patient with HER2 positive metastatic breast carcinoma initially diagnosed in 10/07/2021 S/p 4 cycles of TCHP and then was lost to follow-up Recurrence with metastasis in 11/05/2022 s/p cerebellar metastatic resection and was started on Enhertu  Tolerating Enhertu  well but has brain metastasis undergoing SRS.  Rest of the metastatic areas in liver and bone had significant treatment response.  -Continue CT scan every 3 months.  Next to 04/2024 -Continue current treatment with Enhertu .  Tolerating well.  Cycle 16-day 1 today.  Labs and physical exam stable today, continue with treatment. -Echo from 01/2024 normal.  Repeat in 6 months.  Return to clinic in 3 weeks for follow-up and next treatment.

## 2024-03-14 NOTE — Assessment & Plan Note (Addendum)
 Patient has a history of hypertension and was taking clonidine 0.1 mg twice daily Unknown who prescribed it and how often patient was taking it Blood pressure today in the clinic was normal and she reported taking half of clonidine this morning  - Start amlodipine  10 mg daily - Discontinue clonidine at this time - Recommended follow-up with primary care

## 2024-03-14 NOTE — Progress Notes (Signed)
 Patient Care Team: Orpha Yancey LABOR, MD as PCP - General (Internal Medicine) Rogers Hai, MD as Medical Oncologist (Medical Oncology) Celestia Joesph SQUIBB, RN as Oncology Nurse Navigator (Oncology)  Clinic Day:  03/14/2024  Referring physician: Orpha Yancey LABOR, MD   CHIEF COMPLAINT:  CC: HER2 positive metastatic breast carcinoma  Sara [name] 48 y.o. female was transferred to my care after her prior physician has left.   ASSESSMENT & PLAN:   Assessment & Plan: SOMA BACHAND  is a 48 y.o. female with HER2 positive metastatic breast carcinoma  Assessment & Plan HER2-positive carcinoma of right breast [facility]) Patient with HER2 positive metastatic breast carcinoma initially diagnosed in 10/07/2021 S/p 4 cycles of TCHP and then was lost to follow-up Recurrence with metastasis in 11/05/2022 s/p cerebellar metastatic resection and was started on Enhertu  Tolerating Enhertu  well but has brain metastasis undergoing SRS.  Rest of the metastatic areas in liver and bone had significant treatment response.  -Continue CT scan every 3 months.  Next to 04/2024 -Continue current treatment with Enhertu .  Tolerating well.  Cycle 16-day 1 today.  Labs and physical exam stable today, continue with treatment. -Echo from 01/2024 normal.  Repeat in 6 months.  Return to clinic in 3 weeks for follow-up and next treatment. Breast cancer metastasized to brain, right [facility]) 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  Had multiple brain lesions at that time. Stable disease since then to 01/2024 Recent MRI with multiple new brain lesions s/p SRS  - Continue to follow-up with Dr. Patrcia with radiation oncology - Repeat MRI brain scheduled for October 2025 -Will continue to obtain frequent MRIs every 3 to 6 months Anxiety Taking Xanax  0.5 mg twice daily as needed  - Continue Xanax  0.5 mg twice a day as needed Cancer associated pain Patient is currently taking Percocet  7.5 mg twice daily as needed  - Continue Percocet 7.5 mg twice daily Secondary hypertension Patient has a history of hypertension and was taking clonidine 0.1 mg twice daily Unknown who prescribed it and how often patient was taking it Blood pressure today in the clinic was normal and she reported taking half of clonidine this morning  - Start amlodipine  10 mg daily - Discontinue clonidine at this time - Recommended follow-up with primary care  The patient understands the plans discussed today and is in agreement with them.  She knows to contact our office if she develops concerns prior to her next appointment.  I provided 40 minutes of face-to-face time during this encounter and > 50% was spent counseling as documented under my assessment and plan.    Sara Dry, MD  [facility] [place] - A DEPT OF [facility] [address] [place] [address] Dept: [phone] Dept Fax: [phone]   No orders of the defined types were placed in this encounter.    ONCOLOGY HISTORY:   Oncology History  HER2-positive carcinoma of right breast (HCC)  10/08/2021 Pathology Results   FINAL MICROSCOPIC DIAGNOSIS:   A. LYMPH NODE AXILLARY, RIGHT, BIOPSY:      - Mammary carcinoma, poorly differentiated (see comment).   B. BREAST, MASS, RIGHT, BIOPSY:      - Mammary carcinoma, poorly differentiated (see comment).   Comment: Positive for E-cadherin. ER: Positive, 5% PR: Negative Ki-67: 25% HER2: Positive [3+]   10/28/2021 Initial Diagnosis   Breast cancer, right breast (HCC)   12/03/2021 - 02/25/2022 Chemotherapy  Patient is on Treatment Plan : BREAST DOCEtaxel  + Trastuzumab  + Pertuzumab  (THP) q21d x 8 cycles / Trastuzumab  + Pertuzumab  q21d x 4 cycles     11/10/2022 Imaging   CT CAP with contrast:  IMPRESSION: 1. Significant improved appearance of the right breast. No recurrent mass or obvious chest wall invasion. There are  small enhancing nodules. Recommend close observation. 2. No supraclavicular, axillary, mediastinal or hilar adenopathy. No findings for pulmonary metastatic disease. 3. No residual hepatic lesions suggesting a good response to treatment. 4. Interval healing changes of the lytic bone lesions. No pathologic fracture or spinal canal compromise.   11/12/2022 Procedure   Cerebellar mass resection   11/12/2022 Pathology Results   FINAL MICROSCOPIC DIAGNOSIS:   A. CEREBELLAR MASS, RESECTION:  - Metastatic carcinoma   B. CEREBELLAR MASS, RESECTION:  - Metastatic carcinoma (see Comment)   COMMENT:   - Appropriately controlled immunohistochemical stains show tumor cells are positive for CK7 and GATA-3 indicating a breast primary. ER and CK  20 are negative.  - HER2:  Positive 3+   12/03/2022 Imaging   MRI brain with and without contrast:  IMPRESSION: 1. Mild interval increase in size of a right parietal enhancing lesion, now measuring approximately 12 x 13 mm (previously 11 x 10 mm). Mild increase in surrounding edema. 2. New punctate focus a of enhancement in the right caudate, concerning for new metastasis. 3. Prior right cerebellar metastatic lesion resection without evidence of residual lesion at this site. Herniation of a small left cerebellum through the craniotomy defect, not substantially changed.   01/01/2023 -  Chemotherapy   Patient is on Treatment Plan : BREAST METASTATIC Fam-Trastuzumab Deruxtecan-nxki  (Enhertu ) (5.4) q21d     05/02/2023 Imaging   MRI brain with and without contrast:  IMPRESSION: 1. New punctate focus of enhancement in the lateral high right parietal lobe, suspicious for new metastasis. 2. Possible new punctate focus of enhancement in the or superior right parietal lobe, which could be a new metastasis or small vessel. 3. Interval decrease in size of the more medial left parietal lesion. 4. Similar punctate focus of enhancement in the right caudate. 5. Similar  postoperative changes in the right cerebellum without mass like enhancement.   06/04/2023 Imaging   CT CAP with contrast:   IMPRESSION: 1. Mild, persistent skin thickening of the right breast. Enhancing nodularity throughout the glandular tissue of the right breast seen on prior examination is no longer appreciated, consistent with treatment response. 2. Minimal fine, scattered ground-glass throughout the upper lobes. Appearance on prior examination is suggestive of lymphangitic metastatic disease in the setting of otherwise widespread metastatic breast malignancy. 3. No evidence of residual or recurrent hepatic metastatic disease. 4. Unusually, patient has essentially no visible evidence of previously established widespread lytic osseous metastatic disease   09/14/2023 Imaging   MRI brain with and without contrast:  IMPRESSION: 1. Stable appearance of the brain since 05/02/2023. Postoperative changes in the right cerebellum without evidence of residual or recurrent viable tumor. 2. Stable treated metastasis in the medial right parietal lobe. 3. Stable punctate focus of enhancement in the right caudate head. 4. Stable punctate focus of enhancement along the surface of the right parietal lobe. 5. No new lesion.   09/28/2023 Imaging   CT CAP with contrast:  IMPRESSION: 1. Stable examination without evidence of new or progressive disease in the chest, abdomen or pelvis. 2. Continued improvement of the irregular nodularity and interlobular septal thickening. Minimal fine scattered ground-glass opacities have decreased in  the interval. 3. As on prior examination there is no definitive visual CT evidence of prior osseous metastatic disease seen on PET-CT November 28, 2021, consider further evaluation by nuclear medicine bone scan.   02/02/2024 Imaging   MRI brain with and without contrast:  IMPRESSION: 1. There are multiple new small cerebral metastases present. The previously demonstrated  metastatic lesions within the right parietal lobe and caudate evident not changed significantly. 2. Stable postoperative encephalomalacia changes within the right cerebellar hemisphere.   02/08/2024 Imaging   ECHO: EF:60-65%   02/11/2024 Imaging   CT CAP with contrast:  IMPRESSION: 1. No evidence of recurrent or metastatic disease in the chest, abdomen, or pelvis. 2. Minimal fine ground-glass scattered throughout the upper lobes and mild diffuse bronchial wall thickening, unchanged. 3. 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 4. Prominent left ovarian and adnexal varices with a prominent left ovarian vein.       Current Treatment:  Enhertu   INTERVAL HISTORY:   Sara [name] is here today for follow up.  She reports some dizziness and headache from recent [facility].  Overall is feeling well and has no other complaints today.  Appetite is good and weight is stable.  I have reviewed the past medical history, past surgical history, social history and family history with the patient and they are unchanged from previous note.  ALLERGIES:  is allergic to hydrocodone , sulfa antibiotics, and morphine  and codeine.  MEDICATIONS:  Current Outpatient Medications  Medication Sig Dispense Refill   amLODipine  (NORVASC ) 10 MG tablet Take 1 tablet (10 mg total) by mouth daily. 30 tablet 2   ALPRAZolam  (XANAX ) 0.5 MG tablet Take 1 tablet (0.5 mg total) by mouth 2 (two) times daily as needed for anxiety. TAKE 1 TABLET BY MOUTH AT BEDTIME AS NEEDED FOR ANXIETY 60 tablet 0   aluminum -magnesium  hydroxide 200-200 MG/5ML suspension Take 15 mLs by mouth every 6 (six) hours as needed for indigestion (Mix with 15 ml of lidocaine  2% and swish and spit). 355 mL 2   docusate sodium  (COLACE) 100 MG capsule Take 1 capsule (100 mg total) by mouth 2 (two) times daily.     furosemide  (LASIX ) 20 MG tablet Take 1 tablet (20 mg total) by mouth daily as needed. 30 tablet 0   lidocaine  (XYLOCAINE ) 2 %  solution Use as directed 15 mLs in the mouth or throat every 6 (six) hours as needed for mouth pain (Mix with 15 ml of maalox and swish and spit). 100 mL 2   lidocaine -prilocaine  (EMLA ) cream Apply 1 Application topically as needed. 30 g 0   mirtazapine  (REMERON ) 15 MG tablet Take 1 tablet (15 mg total) by mouth at bedtime. 30 tablet 2   ondansetron  (ZOFRAN ) 8 MG tablet TAKE 1 TABLET BY MOUTH EVERY 8 HOURS AS NEEDED FOR NAUSEA FOR VOMITING 90 tablet 5   oxyCODONE -acetaminophen  (PERCOCET) 7.5-325 MG tablet Take 1 tablet by mouth every 6 (six) hours as needed for severe pain (pain score 7-10). 84 tablet 0   pantoprazole  (PROTONIX ) 20 MG tablet Take 1 tablet (20 mg total) by mouth daily. 30 tablet 5   prochlorperazine  (COMPAZINE ) 10 MG tablet Take 1 tablet (10 mg total) by mouth every 6 (six) hours as needed for nausea or vomiting. 30 tablet 5   No current facility-administered medications for this visit.   Facility-Administered Medications Ordered in Other Visits  Medication Dose Route Frequency Provider Last Rate Last Admin   sodium chloride  flush (NS) 0.9 % injection 10 mL  10 mL Intracatheter PRN Rogers Hai, MD   10 mL at 03/14/24 1447    REVIEW OF SYSTEMS:   Constitutional: Denies fevers, chills or abnormal weight loss Eyes: Denies blurriness of vision Ears, nose, mouth, throat, and face: Denies mucositis or sore throat Respiratory: Denies cough, dyspnea or wheezes Cardiovascular: Denies palpitation, chest discomfort or lower extremity swelling Gastrointestinal:  Denies nausea, heartburn or change in bowel habits Skin: Denies abnormal skin rashes Lymphatics: Denies new lymphadenopathy or easy bruising Neurological:Denies numbness, tingling or new weaknesses Behavioral/Psych: Mood is stable, no new changes  All other systems were reviewed with the patient and are negative.   VITALS:  There were no vitals taken for this visit.  Wt Readings from Last 3 Encounters:  03/14/24  128 lb 15.5 oz (58.5 kg)  02/18/24 127 lb 6.8 oz (57.8 kg)  02/05/24 130 lb (59 kg)    There is no height or weight on file to calculate BMI.  Performance status (ECOG): 1 - Symptomatic but completely ambulatory  PHYSICAL EXAM:   GENERAL:alert, no distress and comfortable SKIN: skin color, texture, turgor are normal, no rashes or significant lesions BREAST: Right breast: No masses palpated, no discharge, some hypopigmentation of the nipple.  Left breast: Normal breast exam LYMPH:  no palpable lymphadenopathy in the cervical, axillary or inguinal LUNGS: clear to auscultation and percussion with normal breathing effort HEART: regular rate & rhythm and no murmurs and no lower extremity edema ABDOMEN:abdomen soft, non-tender and normal bowel sounds Musculoskeletal:no cyanosis of digits and no clubbing  NEURO: alert & oriented x 3 with fluent speech  LABORATORY DATA:  I have reviewed the data as listed    Component Value Date/Time   NA 135 03/14/2024 1017   K 3.6 03/14/2024 1017   CL 104 03/14/2024 1017   CO2 25 03/14/2024 1017   GLUCOSE 87 03/14/2024 1017   BUN 21 (H) 03/14/2024 1017   CREATININE 0.74 03/14/2024 1017   CALCIUM 8.9 03/14/2024 1017   PROT 6.9 03/14/2024 1017   ALBUMIN  3.7 03/14/2024 1017   AST 36 03/14/2024 1017   ALT 36 03/14/2024 1017   ALKPHOS 74 03/14/2024 1017   BILITOT 0.3 03/14/2024 1017   GFRNONAA >60 03/14/2024 1017   GFRAA >60 10/25/2019 1224    Lab Results  Component Value Date   WBC 6.2 03/14/2024   NEUTROABS 3.7 03/14/2024   HGB 12.6 03/14/2024   HCT 37.6 03/14/2024   MCV 99.5 03/14/2024   PLT 225 03/14/2024      Chemistry      Component Value Date/Time   NA 135 03/14/2024 1017   K 3.6 03/14/2024 1017   CL 104 03/14/2024 1017   CO2 25 03/14/2024 1017   BUN 21 (H) 03/14/2024 1017   CREATININE 0.74 03/14/2024 1017      Component Value Date/Time   CALCIUM 8.9 03/14/2024 1017   ALKPHOS 74 03/14/2024 1017   AST 36 03/14/2024 1017    ALT 36 03/14/2024 1017   BILITOT 0.3 03/14/2024 1017       RADIOGRAPHIC STUDIES: I have personally reviewed the radiological images as listed and agreed with the findings in the report.  CT CHEST ABDOMEN PELVIS W CONTRAST CLINICAL DATA:  Metastatic breast cancer restaging * Tracking Code: BO *  EXAM: CT CHEST, ABDOMEN, AND PELVIS WITH CONTRAST  TECHNIQUE: Multidetector CT imaging of the chest, abdomen and pelvis was performed following the standard protocol during bolus administration of intravenous contrast.  RADIATION DOSE REDUCTION: This exam was performed  according to the departmental dose-optimization program which includes automated exposure control, adjustment of the mA and/or kV according to patient size and/or use of iterative reconstruction technique.  CONTRAST:  OMNIPAQUE  IOHEXOL  300 MG/ML  SOLN  COMPARISON:  09/28/2023  FINDINGS: CT CHEST FINDINGS  Cardiovascular: Left chest port catheter. Normal heart size. No pericardial effusion.  Mediastinum/Nodes: No enlarged mediastinal, hilar, or axillary lymph nodes. Thyroid gland, trachea, and esophagus demonstrate no significant findings.  Lungs/Pleura: Minimal fine ground-glass scattered throughout the upper lobes and similar to prior (series 3, image 58). Mild diffuse bilateral bronchial wall thickening. No pleural effusion or pneumothorax.  Musculoskeletal: No chest wall abnormality. No acute osseous findings.  CT ABDOMEN PELVIS FINDINGS  Hepatobiliary: No focal liver abnormality is seen. Status post cholecystectomy. Unchanged postoperative biliary ductal dilatation.  Pancreas: Unremarkable. No pancreatic ductal dilatation or surrounding inflammatory changes.  Spleen: Normal in size without significant abnormality.  Adrenals/Urinary Tract: Adrenal glands are unremarkable. Kidneys are normal, without renal calculi, solid lesion, or hydronephrosis. Bladder is unremarkable.  Stomach/Bowel:  Stomach is within normal limits. 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 No evidence of bowel wall thickening, distention, or inflammatory changes.  Vascular/Lymphatic: Prominent left ovarian and adnexal varices (series 2, image 107) with a prominent left ovarian vein, measuring up to 1.2 cm in caliber near the confluence with the renal vein (series 2, image 70). No enlarged abdominal or pelvic lymph nodes.  Reproductive: No mass or other abnormality.  Other: No abdominal wall hernia or abnormality. No ascites.  Musculoskeletal: No acute osseous findings. No CT correlate for previously identified FDG avid osseous metastatic disease.  IMPRESSION: 1. No evidence of recurrent or metastatic disease in the chest, abdomen, or pelvis. 2. Minimal fine ground-glass scattered throughout the upper lobes and mild diffuse bronchial wall thickening, unchanged. 3. 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 4. Prominent left ovarian and adnexal varices with a prominent left ovarian vein. This can be seen in the setting of pelvic congestion if clinically referable signs and symptoms are present.  Electronically Signed   By: Marolyn JONETTA Jaksch M.D.   On: 02/11/2024 21:46

## 2024-03-14 NOTE — Patient Instructions (Signed)
 [facility] [place] - A DEPT OF [facility]  Discharge Instructions: Thank you for choosing [facility] to provide your oncology and hematology care.  If you have a lab appointment with the Cancer Center - please note that after April 8th, 2024, all labs will be drawn in the cancer center.  You do not have to check in or register with the main entrance as you have in the past but will complete your check-in in the cancer center.  Wear comfortable clothing and clothing appropriate for easy access to any Portacath or PICC line.   We strive to give you quality time with your provider. You may need to reschedule your appointment if you arrive late (15 or more minutes).  Arriving late affects you and other patients whose appointments are after yours.  Also, if you miss three or more appointments without notifying the office, you may be dismissed from the clinic at the provider's discretion.      For prescription refill requests, have your pharmacy contact our office and allow 72 hours for refills to be completed.    Today you received the following chemotherapy and/or immunotherapy agents Enhertu. Fam-Trastuzumab Deruxtecan Injection What is this medication? FAM-TRASTUZUMAB DERUXTECAN (fam-tras TOOZ eu mab DER ux TEE kan) treats some types of cancer. It works by blocking a protein that causes cancer cells to grow and multiply. This helps to slow or stop the spread of cancer cells. This medicine may be used for other purposes; ask your health care provider or pharmacist if you have questions. COMMON BRAND NAME(S): ENHERTU What should I tell my care team before I take this medication? They need to know if you have any of these conditions: Heart disease Heart failure Infection, especially a viral infection, such as chickenpox, cold sores, or herpes Liver disease Lung or breathing disease, such as asthma or COPD An unusual or allergic reaction to fam-trastuzumab  deruxtecan, other medications, foods, dyes, or preservatives Pregnant or trying to get pregnant Breast-feeding How should I use this medication? This medication is injected into a vein. It is given by your care team in a hospital or clinic setting. A special MedGuide will be given to you before each treatment. Be sure to read this information carefully each time. Talk to your care team about the use of this medication in children. Special care may be needed. Overdosage: If you think you have taken too much of this medicine contact a poison control center or emergency room at once. NOTE: This medicine is only for you. Do not share this medicine with others. What if I miss a dose? It is important not to miss your dose. Call your care team if you are unable to keep an appointment. What may interact with this medication? Interactions are not expected. This list may not describe all possible interactions. Give your health care provider a list of all the medicines, herbs, non-prescription drugs, or dietary supplements you use. Also tell them if you smoke, drink alcohol, or use illegal drugs. Some items may interact with your medicine. What should I watch for while using this medication? Visit your care team for regular checks on your progress. Tell your care team if your symptoms do not start to get better or if they get worse. This medication may increase your risk of getting an infection. Call your care team for advice if you get a fever, chills, sore throat, or other symptoms of a cold or flu. Do  not treat yourself. Try to avoid being around people who are sick. Avoid taking medications that contain aspirin, acetaminophen, ibuprofen, naproxen, or ketoprofen unless instructed by your care team. These medications may hide a fever. Be careful brushing or flossing your teeth or using a toothpick because you may get an infection or bleed more easily. If you have any dental work done, tell your dentist you  are receiving this medication. This medication may cause dry eyes and blurred vision. If you wear contact lenses, you may feel some discomfort. Lubricating eye drops may help. See your care team if the problem does not go away or is severe. Talk to your care team if you may be pregnant. Serious birth defects can occur if you take this medication during pregnancy and for 7 months after the last dose. If your partner can get pregnant, use a condom during sex while taking this medication and for 4 months after the last dose. Do not breastfeed while taking this medication and for 7 months after the last dose. This medication may cause infertility. Talk to your care team if you are concerned about your fertility. What side effects may I notice from receiving this medication? Side effects that you should report to your care team as soon as possible: Allergic reactions--skin rash, itching, hives, swelling of the face, lips, tongue, or throat Dry cough, shortness of breath or trouble breathing Infection--fever, chills, cough, sore throat, wounds that don't heal, pain or trouble when passing urine, general feeling of discomfort or being unwell Heart failure--shortness of breath, swelling of the ankles, feet, or hands, sudden weight gain, unusual weakness or fatigue Unusual bruising or bleeding Side effects that usually do not require medical attention (report these to your care team if they continue or are bothersome): Constipation Diarrhea Hair loss Muscle pain Nausea Vomiting This list may not describe all possible side effects. Call your doctor for medical advice about side effects. You may report side effects to FDA at 1-800-FDA-1088. Where should I keep my medication? This medication is given in a hospital or clinic. It will not be stored at home. NOTE: This sheet is a summary. It may not cover all possible information. If you have questions about this medicine, talk to your doctor, pharmacist, or  health care provider.  2024 Elsevier/Gold Standard (2023-04-03 00:00:00)      To help prevent nausea and vomiting after your treatment, we encourage you to take your nausea medication as directed.  BELOW ARE SYMPTOMS THAT SHOULD BE REPORTED IMMEDIATELY: *FEVER GREATER THAN 100.4 F (38 C) OR HIGHER *CHILLS OR SWEATING *NAUSEA AND VOMITING THAT IS NOT CONTROLLED WITH YOUR NAUSEA MEDICATION *UNUSUAL SHORTNESS OF BREATH *UNUSUAL BRUISING OR BLEEDING *URINARY PROBLEMS (pain or burning when urinating, or frequent urination) *BOWEL PROBLEMS (unusual diarrhea, constipation, pain near the anus) TENDERNESS IN MOUTH AND THROAT WITH OR WITHOUT PRESENCE OF ULCERS (sore throat, sores in mouth, or a toothache) UNUSUAL RASH, SWELLING OR PAIN  UNUSUAL VAGINAL DISCHARGE OR ITCHING   Items with * indicate a potential emergency and should be followed up as soon as possible or go to the Emergency Department if any problems should occur.  Please show the CHEMOTHERAPY ALERT CARD or IMMUNOTHERAPY ALERT CARD at check-in to the Emergency Department and triage nurse.  Should you have questions after your visit or need to cancel or reschedule your appointment, please contact [facility] [place] - A DEPT OF Eligha Bridegroom [facility] [phone]  and follow the prompts.  Office  hours are 8:00 a.m. to 4:30 p.m. Monday - Friday. Please note that voicemails left after 4:00 p.m. may not be returned until the following business day.  We are closed weekends and major holidays. You have access to a nurse at all times for urgent questions. Please call the main number to the clinic [phone] and follow the prompts.  For any non-urgent questions, you may also contact your provider using MyChart. We now offer e-Visits for anyone 55 and older to request care online for non-urgent symptoms. For details visit [email or web address].   Also download the MyChart app! Go to the app store, search "MyChart", open the app,  select [place], and log in with your MyChart username and password.

## 2024-03-14 NOTE — Progress Notes (Signed)
 Patient presents today for chemotherapy infusion of Enhertu . Patient does have the complaint of being worn out and off balance with the last radiation and chemo infusion in which Dr Davonna is aware.Vital signs are stable.  Labs reviewed by Dr. Davonna during the office visit and all labs are within treatment parameters.  We will proceed with treatment per MD orders.   Patient tolerated treatment well with no complaints voiced.  Patient left ambulatory in stable condition.  Vital signs stable at discharge.  Follow up as scheduled.

## 2024-03-14 NOTE — Assessment & Plan Note (Addendum)
 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  Had multiple brain lesions at that time. Stable disease since then to 01/2024 Recent MRI with multiple new brain lesions s/p SRS  - Continue to follow-up with Dr. Patrcia with radiation oncology - Repeat MRI brain scheduled for October 2025 -Will continue to obtain frequent MRIs every 3 to 6 months

## 2024-03-14 NOTE — Progress Notes (Signed)
   03/14/24 1500  Spiritual Encounters  Type of Visit Initial  Care provided to: Patient;Friend  Referral source Chaplain assessment  Reason for visit Routine spiritual support  OnCall Visit No  Spiritual Framework  Presenting Themes Meaning/purpose/sources of inspiration;Caregiving needs;Other (comment) (Introduction to Spritual Care)  Interventions  Spiritual Care Interventions Made Established relationship of care and support  Intervention Outcomes  Outcomes Awareness of support  Spiritual Care Plan  Spiritual Care Issues Still Outstanding Chaplain will continue to follow   Reason for Visit: Chaplain making rounds on the floor visiting infusion Pts  Description of Visit: Arriving in the room I found Daneille sitting in the recliner chair receiving chemo treatment, with a friend present for support.  I introduced myself as the chaplain for the cancer center and offered a brief education on the role of a chaplain and the support we can offer to our patients, caregivers, and staff.    I attempted to begin a conversation with Adrine but she was wanting a context with more privacy for a conversation.  She asked me for a business card and a way to contact me and she would reach out.  I gave her the information  Plan of Care: I have marked Marni's next appointment on my calendar and if I don't hear from her by then I will connect at the appointment.   Maude Roll, MDiv  Chaplain, [facility] [email or web address] [email or web address] [phone]

## 2024-03-24 ENCOUNTER — Inpatient Hospital Stay: Payer: MEDICAID

## 2024-03-24 ENCOUNTER — Inpatient Hospital Stay: Payer: MEDICAID | Admitting: Hematology

## 2024-03-25 NOTE — Progress Notes (Signed)
  Radiation Oncology         [phone]) [phone] ________________________________  Name: Sara [name] MRN: [id number]  Date of Service: 03/28/2024  DOB: 12-Dec-1975  Post Treatment Telephone Note  Diagnosis:  Secondary malignant neoplasm of brain    The patient was not available for call today.    Repeat MRI brain scheduled for October 2025 -Will continue to obtain frequent MRIs every 3 to 6 months

## 2024-03-28 ENCOUNTER — Ambulatory Visit
Admission: RE | Admit: 2024-03-28 | Discharge: 2024-03-28 | Disposition: A | Discharge status: Home / Self Care | Payer: MEDICAID | Source: Ambulatory Visit | Attending: Internal Medicine | Admitting: Internal Medicine

## 2024-03-29 IMAGING — CT NM PET TUM IMG INITIAL (PI) SKULL BASE T - THIGH
7 series · 25 of 25 positions shown · non-contrast
Comparison: Chest CT 10/24/2021

CLINICAL DATA: Initial treatment strategy for breast cancer.

EXAM:
NUCLEAR MEDICINE PET SKULL BASE TO THIGH
TECHNIQUE: 6.95 mCi F-18 FDG was injected intravenously. Full-ring PET imaging
was performed from the skull base to thigh after the radiotracer. CT
data was obtained and used for attenuation correction and anatomic
localization.
Fasting blood glucose: 102 mg/dl

[Series 3: ctac · axial · 3.0mm · 0.98mm/px · z∈[-894,-108]mm · 4 of 263 slices shown]
[im 1/263]
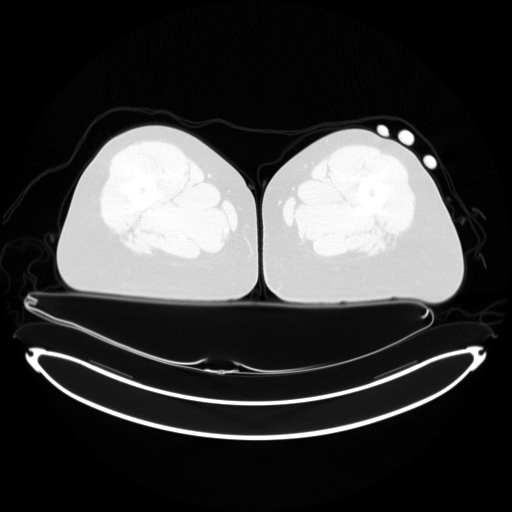
[im 88/263]
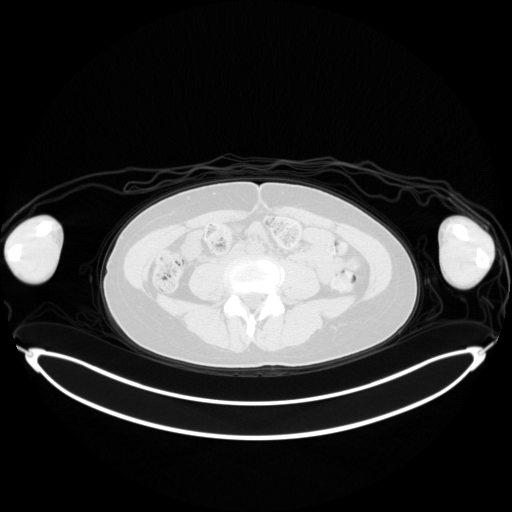
[im 175/263]
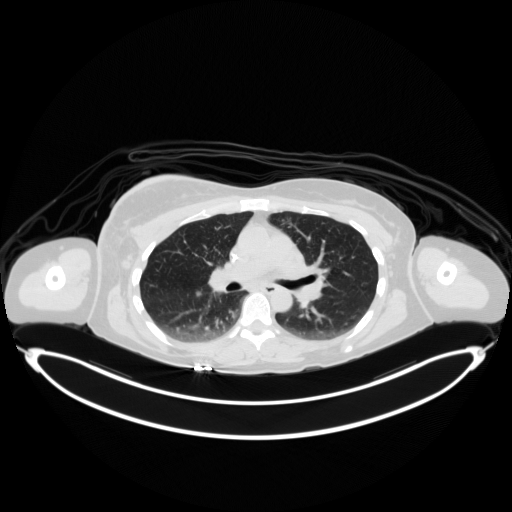
[im 263/263  brain]
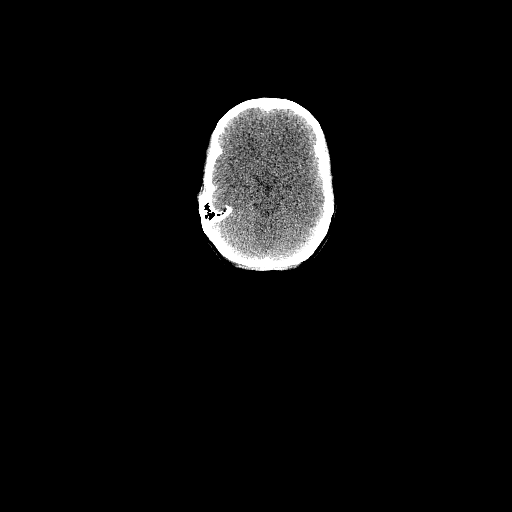

[Series 4: pet ac · axial · 3.0mm · 4.11mm/px · z∈[-894,-108]mm · 4 of 263 slices shown]
[im 1/263]
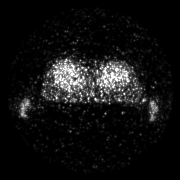
[im 88/263]
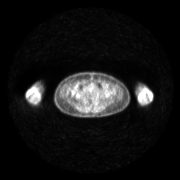
[im 175/263]
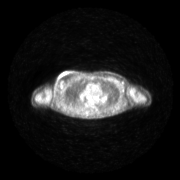
[im 263/263]
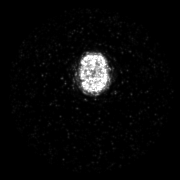

[Series 5: pet nac · axial · 3.0mm · 4.11mm/px · z∈[-894,-108]mm · 5 of 263 slices shown]
[im 1/263]
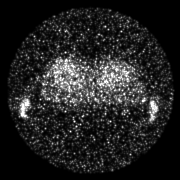
[im 66/263]
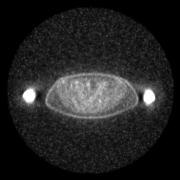
[im 132/263]
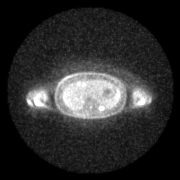
[im 197/263]
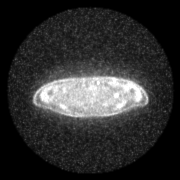
[im 263/263]
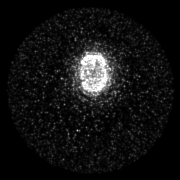

[Series 7: ct lung · axial · 3.0mm · 0.98mm/px · z∈[-512,-228]mm · 2 of 96 slices shown]
[im 1/96]
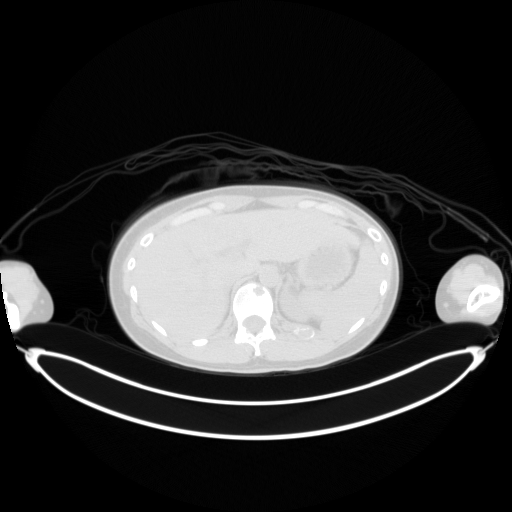
[im 96/96]
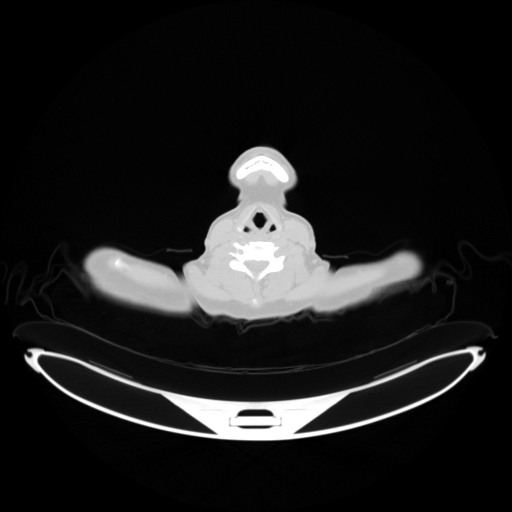

[Series 606: fused tra · 7 of 393 slices shown]
[im 1/393]
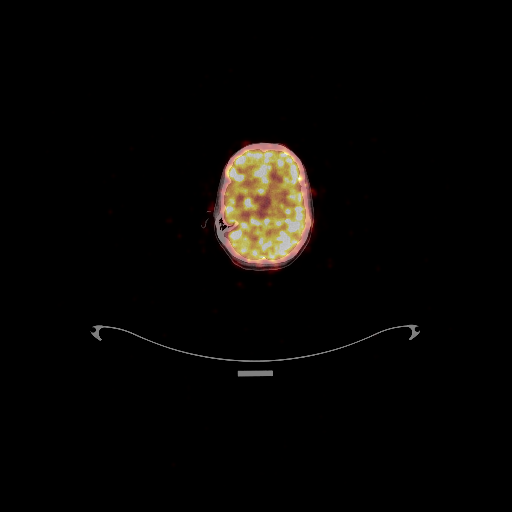
[im 66/393]
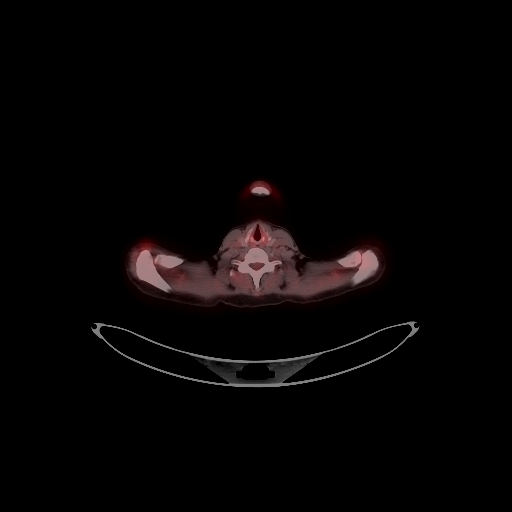
[im 131/393]
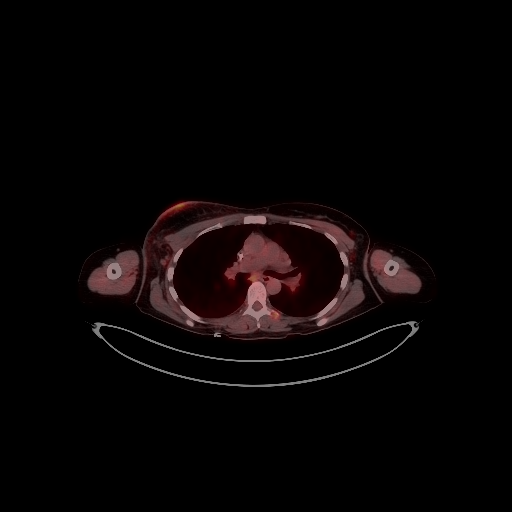
[im 197/393]
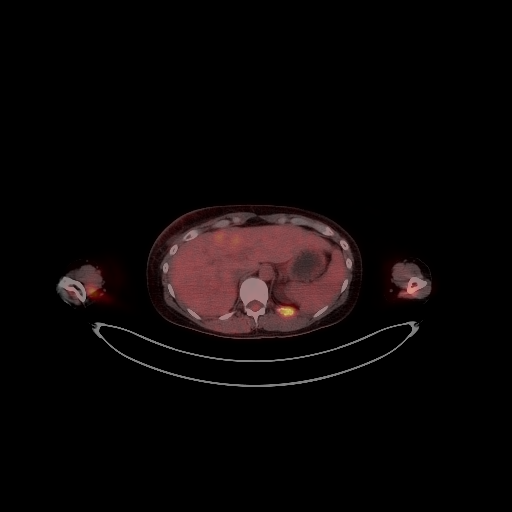
[im 262/393]
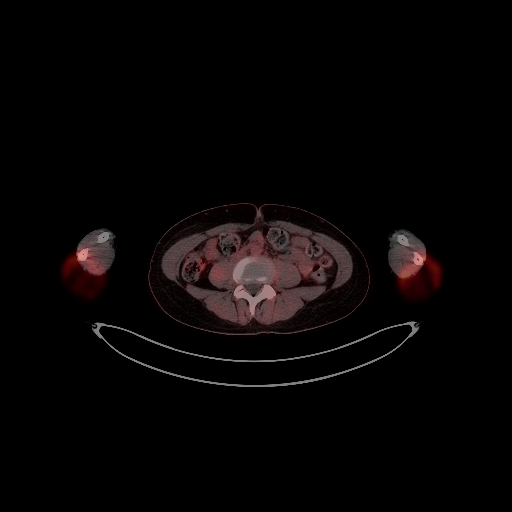
[im 327/393]
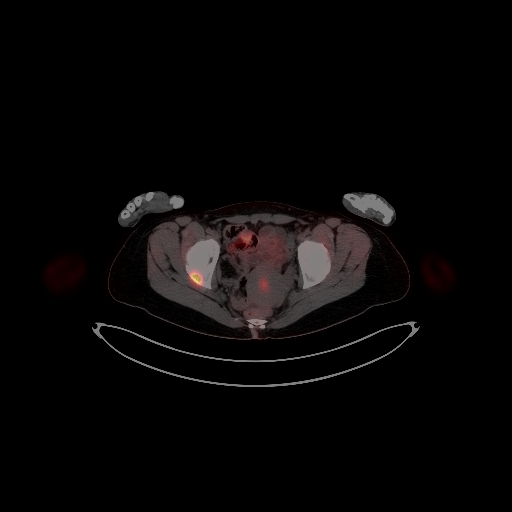
[im 393/393]
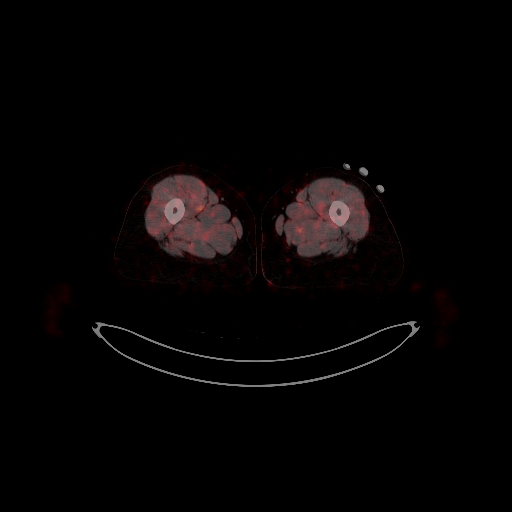

[Series 608: fused cor · 2 of 124 slices shown]
[im 1/124]
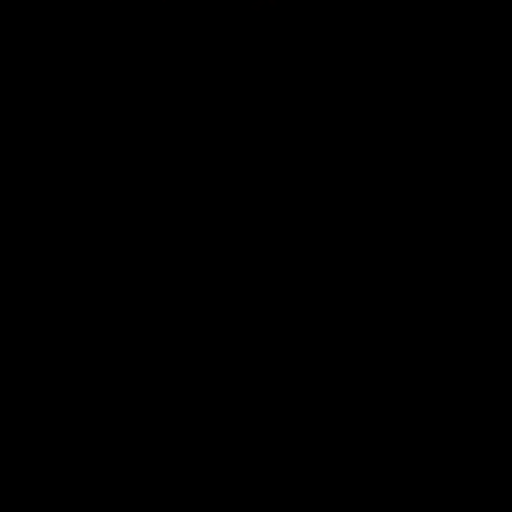
[im 124/124]
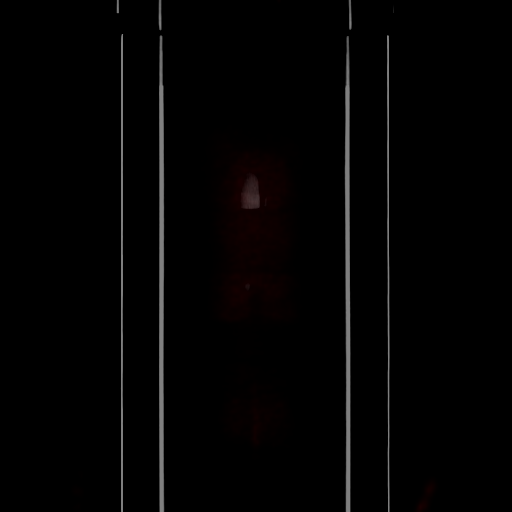

[Series 609: mip cine · coronal · 1.63mm/px · 1 of 48 slices shown]
[im 1/48]
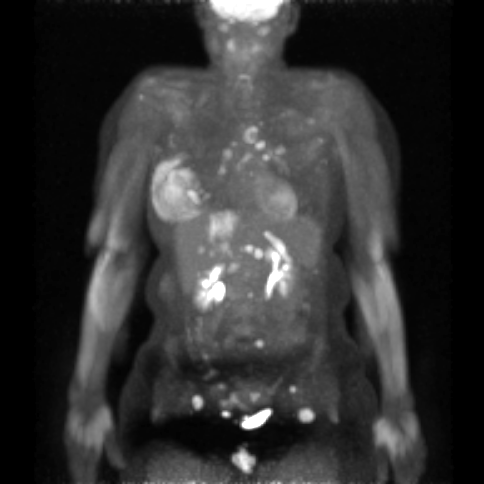

[25 of 25 positions shown; findings below may reference images not displayed]

FINDINGS: Mediastinal blood pool activity: SUV max

Liver activity: SUV max NA

NECK: No hypermetabolic lymph nodes in the neck.

Incidental CT findings: Of

CHEST: Large central right breast mass with extensive interstitial
thickening throughout the breast and marked skin thickening. These
areas demonstrate hypermetabolism with SUV max of 8.08 and
consistent with known breast cancer. There is also evidence of
direct invasion of the lower aspect of the right pectoralis muscle.
No right axillary or supraclavicular adenopathy.

Hypermetabolic mediastinal and hilar lymph nodes consistent with
metastatic adenopathy. Right hilar node has an SUV max of 4.02. 7 mm
prevascular node has an SUV max of 7.27. Subcarinal node has an SUV
max of 4.12. Moderate breathing motion artifact on the CT scan. No
discrete pulmonary nodules to suggest pulmonary metastatic disease
and I do not see any definite nodules on the CT scan from
10/24/2021.

Incidental CT findings: Left-sided Port-A-Cath in good position.

ABDOMEN/PELVIS: Numerous hypermetabolic hepatic lesions consistent
with hepatic metastatic disease. The largest lesion in the left
hepatic lobe measures approximately 3.6 cm and the SUV max is 7.86.
Other smaller hypermetabolic metastases and both lobes of the liver.

No adrenal gland lesions. No hypermetabolic abdominal/pelvic
lymphadenopathy the.

Mild hypermetabolism is noted in the endometrium. This is likely due
to secretory phase of ovulation.

Incidental CT findings: Prior cholecystectomy with associated mild
chronic common bile duct dilatation.

SKELETON: Diffuse osseous metastatic disease with lytic
hypermetabolic lesions. Left-sided lytic sternal manubrial lesion
has an SUV max of 5.87.

Numerous rib and vertebral body lesions. Significant lytic
destructive bony changes involving the left twelfth rib. SUV max is
12.78. There are lytic vertebral body and left pedicle lesions at
L1. Left pedicle lesion has an SUV max of 8.35.

Right posterior acetabular lesion has an SUV max of 7.05.

Lytic destructive cortical lesion involving the left femoral neck
anteriorly measures 15 mm and the SUV max is 7.15. Definite risk for
pathologic fracture.

No spinal canal invasion is identified.

Incidental CT findings: none
IMPRESSION: 1. Extensive hypermetabolic tumor involving the right breast. There
is also focal invasion of the lower pectoralis muscle.
2. Metastatic mediastinal and hilar lymphadenopathy and hepatic
metastatic disease.
3. Diffuse lytic destructive metastatic bone disease. Cortical
lesion involving the left femoral neck with definite risk for
pathologic fracture.
4. Multiple rib and spine lesions but no canal compromise is
demonstrated.

## 2024-04-01 ENCOUNTER — Encounter: Payer: Self-pay | Admitting: Oncology

## 2024-04-03 NOTE — Progress Notes (Incomplete)
 Patient Care Team: Orpha Yancey LABOR, MD as PCP - General (Internal Medicine) Celestia Joesph SQUIBB, RN as Oncology Nurse Navigator (Oncology)  Clinic Day:  04/03/2024  Referring physician: Orpha Yancey LABOR, MD   CHIEF COMPLAINT:  CC: HER2 positive metastatic breast carcinoma  Sara [name] 48 y.o. female was transferred to my care after her prior physician has left.   ASSESSMENT & PLAN:   Assessment & Plan: Sara [name]  is a 48 y.o. female with HER2 positive metastatic breast carcinoma  Assessment & Plan   The patient understands the plans discussed today and is in agreement with them.  She knows to contact our office if she develops concerns prior to her next appointment.  I provided *** minutes of face-to-face time during this encounter and > 50% was spent counseling as documented under my assessment and plan.    Sara [name],acting as a Neurosurgeon for Mickiel Dry, MD.,have documented all relevant documentation on the behalf of Mickiel Dry, MD,as directed by  Mickiel Dry, MD while in the presence of Mickiel Dry, MD.  ***   Verneta SAUNDERS Ege  [facility] [place] - A DEPT OF [facility] [address] [place] [address] Dept: [phone] Dept Fax: [phone]   No orders of the defined types were placed in this encounter.    ONCOLOGY HISTORY:   Oncology History  HER2-positive carcinoma of right breast (HCC)  10/08/2021 Pathology Results   FINAL MICROSCOPIC DIAGNOSIS:   A. LYMPH NODE AXILLARY, RIGHT, BIOPSY:      - Mammary carcinoma, poorly differentiated (see comment).   B. BREAST, MASS, RIGHT, BIOPSY:      - Mammary carcinoma, poorly differentiated (see comment).   Comment: Positive for E-cadherin. ER: Positive, 5% PR: Negative Ki-67: 25% HER2: Positive [3+]   10/28/2021 Initial Diagnosis   Breast cancer, right breast (HCC)   12/03/2021 - 02/25/2022 Chemotherapy   Patient is on  Treatment Plan : BREAST DOCEtaxel  + Trastuzumab  + Pertuzumab  (THP) q21d x 8 cycles / Trastuzumab  + Pertuzumab  q21d x 4 cycles     11/10/2022 Imaging   CT CAP with contrast:  IMPRESSION: 1. Significant improved appearance of the right breast. No recurrent mass or obvious chest wall invasion. There are small enhancing nodules. Recommend close observation. 2. No supraclavicular, axillary, mediastinal or hilar adenopathy. No findings for pulmonary metastatic disease. 3. No residual hepatic lesions suggesting a good response to treatment. 4. Interval healing changes of the lytic bone lesions. No pathologic fracture or spinal canal compromise.   11/12/2022 Procedure   Cerebellar mass resection   11/12/2022 Pathology Results   FINAL MICROSCOPIC DIAGNOSIS:   A. CEREBELLAR MASS, RESECTION:  - Metastatic carcinoma   B. CEREBELLAR MASS, RESECTION:  - Metastatic carcinoma (see Comment)   COMMENT:   - Appropriately controlled immunohistochemical stains show tumor cells are positive for CK7 and GATA-3 indicating a breast primary. ER and CK  20 are negative.  - HER2:  Positive 3+   12/03/2022 Imaging   MRI brain with and without contrast:  IMPRESSION: 1. Mild interval increase in size of a right parietal enhancing lesion, now measuring approximately 12 x 13 mm (previously 11 x 10 mm). Mild increase in surrounding edema. 2. New punctate focus a of enhancement in the right caudate, concerning for new metastasis. 3. Prior right cerebellar metastatic lesion resection without evidence of residual lesion at this site. Herniation of a small left cerebellum through the craniotomy  defect, not substantially changed.   01/01/2023 -  Chemotherapy   Patient is on Treatment Plan : BREAST METASTATIC Fam-Trastuzumab Deruxtecan-nxki  (Enhertu ) (5.4) q21d     05/02/2023 Imaging   MRI brain with and without contrast:  IMPRESSION: 1. New punctate focus of enhancement in the lateral high right parietal lobe,  suspicious for new metastasis. 2. Possible new punctate focus of enhancement in the or superior right parietal lobe, which could be a new metastasis or small vessel. 3. Interval decrease in size of the more medial left parietal lesion. 4. Similar punctate focus of enhancement in the right caudate. 5. Similar postoperative changes in the right cerebellum without mass like enhancement.   06/04/2023 Imaging   CT CAP with contrast:   IMPRESSION: 1. Mild, persistent skin thickening of the right breast. Enhancing nodularity throughout the glandular tissue of the right breast seen on prior examination is no longer appreciated, consistent with treatment response. 2. Minimal fine, scattered ground-glass throughout the upper lobes. Appearance on prior examination is suggestive of lymphangitic metastatic disease in the setting of otherwise widespread metastatic breast malignancy. 3. No evidence of residual or recurrent hepatic metastatic disease. 4. Unusually, patient has essentially no visible evidence of previously established widespread lytic osseous metastatic disease   09/14/2023 Imaging   MRI brain with and without contrast:  IMPRESSION: 1. Stable appearance of the brain since 05/02/2023. Postoperative changes in the right cerebellum without evidence of residual or recurrent viable tumor. 2. Stable treated metastasis in the medial right parietal lobe. 3. Stable punctate focus of enhancement in the right caudate head. 4. Stable punctate focus of enhancement along the surface of the right parietal lobe. 5. No new lesion.   09/28/2023 Imaging   CT CAP with contrast:  IMPRESSION: 1. Stable examination without evidence of new or progressive disease in the chest, abdomen or pelvis. 2. Continued improvement of the irregular nodularity and interlobular septal thickening. Minimal fine scattered ground-glass opacities have decreased in the interval. 3. As on prior examination there is no definitive  visual CT evidence of prior osseous metastatic disease seen on PET-CT November 28, 2021, consider further evaluation by nuclear medicine bone scan.   02/02/2024 Imaging   MRI brain with and without contrast:  IMPRESSION: 1. There are multiple new small cerebral metastases present. The previously demonstrated metastatic lesions within the right parietal lobe and caudate evident not changed significantly. 2. Stable postoperative encephalomalacia changes within the right cerebellar hemisphere.   02/08/2024 Imaging   ECHO: EF:60-65%   02/11/2024 Imaging   CT CAP with contrast:  IMPRESSION: 1. No evidence of recurrent or metastatic disease in the chest, abdomen, or pelvis. 2. Minimal fine ground-glass scattered throughout the upper lobes and mild diffuse bronchial wall thickening, unchanged. 3. 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 4. Prominent left ovarian and adnexal varices with a prominent left ovarian vein.       Current Treatment:  Enhertu   INTERVAL HISTORY:   Sara [name] is here today for follow up.   I have reviewed the past medical history, past surgical history, social history and family history with the patient and they are unchanged from previous note.  ALLERGIES:  is allergic to hydrocodone , sulfa antibiotics, and morphine  and codeine.  MEDICATIONS:  Current Outpatient Medications  Medication Sig Dispense Refill   ALPRAZolam  (XANAX ) 0.5 MG tablet Take 1 tablet (0.5 mg total) by mouth 2 (two) times daily as needed for anxiety. TAKE 1 TABLET BY MOUTH AT BEDTIME AS NEEDED FOR ANXIETY 60  tablet 0   aluminum -magnesium  hydroxide 200-200 MG/5ML suspension Take 15 mLs by mouth every 6 (six) hours as needed for indigestion (Mix with 15 ml of lidocaine  2% and swish and spit). 355 mL 2   amLODipine  (NORVASC ) 10 MG tablet Take 1 tablet (10 mg total) by mouth daily. 30 tablet 2   docusate sodium  (COLACE) 100 MG capsule Take 1 capsule (100 mg total) by mouth  2 (two) times daily.     furosemide  (LASIX ) 20 MG tablet Take 1 tablet (20 mg total) by mouth daily as needed. 30 tablet 0   lidocaine  (XYLOCAINE ) 2 % solution Use as directed 15 mLs in the mouth or throat every 6 (six) hours as needed for mouth pain (Mix with 15 ml of maalox and swish and spit). 100 mL 2   lidocaine -prilocaine  (EMLA ) cream Apply 1 Application topically as needed. 30 g 0   mirtazapine  (REMERON ) 15 MG tablet Take 1 tablet (15 mg total) by mouth at bedtime. 30 tablet 2   ondansetron  (ZOFRAN ) 8 MG tablet TAKE 1 TABLET BY MOUTH EVERY 8 HOURS AS NEEDED FOR NAUSEA FOR VOMITING 90 tablet 5   oxyCODONE -acetaminophen  (PERCOCET) 7.5-325 MG tablet Take 1 tablet by mouth every 6 (six) hours as needed for severe pain (pain score 7-10). 84 tablet 0   pantoprazole  (PROTONIX ) 20 MG tablet Take 1 tablet (20 mg total) by mouth daily. 30 tablet 5   prochlorperazine  (COMPAZINE ) 10 MG tablet Take 1 tablet (10 mg total) by mouth every 6 (six) hours as needed for nausea or vomiting. 30 tablet 5   No current facility-administered medications for this visit.    REVIEW OF SYSTEMS:   Constitutional: Denies fevers, chills or abnormal weight loss Eyes: Denies blurriness of vision Ears, nose, mouth, throat, and face: Denies mucositis or sore throat Respiratory: Denies cough, dyspnea or wheezes Cardiovascular: Denies palpitation, chest discomfort or lower extremity swelling Gastrointestinal:  Denies nausea, heartburn or change in bowel habits Skin: Denies abnormal skin rashes Lymphatics: Denies new lymphadenopathy or easy bruising Neurological:Denies numbness, tingling or new weaknesses Behavioral/Psych: Mood is stable, no new changes  All other systems were reviewed with the patient and are negative.   VITALS:  There were no vitals taken for this visit.  Wt Readings from Last 3 Encounters:  03/14/24 128 lb 15.5 oz (58.5 kg)  02/18/24 127 lb 6.8 oz (57.8 kg)  02/05/24 130 lb (59 kg)    There is  no height or weight on file to calculate BMI.  Performance status (ECOG): 1 - Symptomatic but completely ambulatory  PHYSICAL EXAM:   GENERAL:alert, no distress and comfortable SKIN: skin color, texture, turgor are normal, no rashes or significant lesions BREAST: Right breast: No masses palpated, no discharge, some hypopigmentation of the nipple.  Left breast: Normal breast exam LYMPH:  no palpable lymphadenopathy in the cervical, axillary or inguinal LUNGS: clear to auscultation and percussion with normal breathing effort HEART: regular rate & rhythm and no murmurs and no lower extremity edema ABDOMEN:abdomen soft, non-tender and normal bowel sounds Musculoskeletal:no cyanosis of digits and no clubbing  NEURO: alert & oriented x 3 with fluent speech  LABORATORY DATA:  I have reviewed the data as listed    Component Value Date/Time   NA 135 03/14/2024 1017   K 3.6 03/14/2024 1017   CL 104 03/14/2024 1017   CO2 25 03/14/2024 1017   GLUCOSE 87 03/14/2024 1017   BUN 21 (H) 03/14/2024 1017   CREATININE 0.74 03/14/2024 1017  CALCIUM 8.9 03/14/2024 1017   PROT 6.9 03/14/2024 1017   ALBUMIN  3.7 03/14/2024 1017   AST 36 03/14/2024 1017   ALT 36 03/14/2024 1017   ALKPHOS 74 03/14/2024 1017   BILITOT 0.3 03/14/2024 1017   GFRNONAA >60 03/14/2024 1017   GFRAA >60 10/25/2019 1224    Lab Results  Component Value Date   WBC 6.2 03/14/2024   NEUTROABS 3.7 03/14/2024   HGB 12.6 03/14/2024   HCT 37.6 03/14/2024   MCV 99.5 03/14/2024   PLT 225 03/14/2024      Chemistry      Component Value Date/Time   NA 135 03/14/2024 1017   K 3.6 03/14/2024 1017   CL 104 03/14/2024 1017   CO2 25 03/14/2024 1017   BUN 21 (H) 03/14/2024 1017   CREATININE 0.74 03/14/2024 1017      Component Value Date/Time   CALCIUM 8.9 03/14/2024 1017   ALKPHOS 74 03/14/2024 1017   AST 36 03/14/2024 1017   ALT 36 03/14/2024 1017   BILITOT 0.3 03/14/2024 1017       RADIOGRAPHIC STUDIES: I have  personally reviewed the radiological images as listed and agreed with the findings in the report.  CT CHEST ABDOMEN PELVIS W CONTRAST CLINICAL DATA:  Metastatic breast cancer restaging * Tracking Code: BO *  EXAM: CT CHEST, ABDOMEN, AND PELVIS WITH CONTRAST  TECHNIQUE: Multidetector CT imaging of the chest, abdomen and pelvis was performed following the standard protocol during bolus administration of intravenous contrast.  RADIATION DOSE REDUCTION: This exam was performed according to the departmental dose-optimization program which includes automated exposure control, adjustment of the mA and/or kV according to patient size and/or use of iterative reconstruction technique.  CONTRAST:  OMNIPAQUE  IOHEXOL  300 MG/ML  SOLN  COMPARISON:  09/28/2023  FINDINGS: CT CHEST FINDINGS  Cardiovascular: Left chest port catheter. Normal heart size. No pericardial effusion.  Mediastinum/Nodes: No enlarged mediastinal, hilar, or axillary lymph nodes. Thyroid gland, trachea, and esophagus demonstrate no significant findings.  Lungs/Pleura: Minimal fine ground-glass scattered throughout the upper lobes and similar to prior (series 3, image 58). Mild diffuse bilateral bronchial wall thickening. No pleural effusion or pneumothorax.  Musculoskeletal: No chest wall abnormality. No acute osseous findings.  CT ABDOMEN PELVIS FINDINGS  Hepatobiliary: No focal liver abnormality is seen. Status post cholecystectomy. Unchanged postoperative biliary ductal dilatation.  Pancreas: Unremarkable. No pancreatic ductal dilatation or surrounding inflammatory changes.  Spleen: Normal in size without significant abnormality.  Adrenals/Urinary Tract: Adrenal glands are unremarkable. Kidneys are normal, without renal calculi, solid lesion, or hydronephrosis. Bladder is unremarkable.  Stomach/Bowel: Stomach is within normal limits. 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 No evidence of bowel wall thickening, distention, or inflammatory changes.  Vascular/Lymphatic: Prominent left ovarian and adnexal varices (series 2, image 107) with a prominent left ovarian vein, measuring up to 1.2 cm in caliber near the confluence with the renal vein (series 2, image 70). No enlarged abdominal or pelvic lymph nodes.  Reproductive: No mass or other abnormality.  Other: No abdominal wall hernia or abnormality. No ascites.  Musculoskeletal: No acute osseous findings. No CT correlate for previously identified FDG avid osseous metastatic disease.  IMPRESSION: 1. No evidence of recurrent or metastatic disease in the chest, abdomen, or pelvis. 2. Minimal fine ground-glass scattered throughout the upper lobes and mild diffuse bronchial wall thickening, unchanged. 3. 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 4. Prominent left ovarian and adnexal varices with a  prominent left ovarian vein. This can be seen in the setting of pelvic congestion if clinically referable signs and symptoms are present.  Electronically Signed   By: Marolyn JONETTA Jaksch M.D.   On: 02/11/2024 21:46

## 2024-04-04 ENCOUNTER — Inpatient Hospital Stay: Payer: MEDICAID | Attending: Hematology

## 2024-04-04 ENCOUNTER — Inpatient Hospital Stay: Payer: MEDICAID

## 2024-04-04 ENCOUNTER — Other Ambulatory Visit: Payer: Self-pay | Admitting: Oncology

## 2024-04-04 ENCOUNTER — Inpatient Hospital Stay: Payer: MEDICAID | Admitting: Oncology

## 2024-04-04 DIAGNOSIS — Z1731 Human epidermal growth factor receptor 2 positive status: Secondary | ICD-10-CM

## 2024-04-05 ENCOUNTER — Other Ambulatory Visit: Payer: Self-pay | Admitting: *Deleted

## 2024-04-05 ENCOUNTER — Inpatient Hospital Stay: Payer: MEDICAID | Attending: Hematology

## 2024-04-05 ENCOUNTER — Encounter: Payer: Self-pay | Admitting: Oncology

## 2024-04-05 ENCOUNTER — Inpatient Hospital Stay: Payer: MEDICAID

## 2024-04-05 ENCOUNTER — Inpatient Hospital Stay (HOSPITAL_BASED_OUTPATIENT_CLINIC_OR_DEPARTMENT_OTHER): Payer: MEDICAID | Admitting: Oncology

## 2024-04-05 VITALS — BP 129/75 | HR 93 | Resp 20

## 2024-04-05 DIAGNOSIS — C7931 Secondary malignant neoplasm of brain: Secondary | ICD-10-CM

## 2024-04-05 DIAGNOSIS — C50911 Malignant neoplasm of unspecified site of right female breast: Secondary | ICD-10-CM | POA: Diagnosis not present

## 2024-04-05 DIAGNOSIS — I159 Secondary hypertension, unspecified: Secondary | ICD-10-CM

## 2024-04-05 DIAGNOSIS — Z1731 Malignant neoplasm of unspecified site of right female breast: Secondary | ICD-10-CM

## 2024-04-05 DIAGNOSIS — G893 Neoplasm related pain (acute) (chronic): Secondary | ICD-10-CM

## 2024-04-05 DIAGNOSIS — F419 Anxiety disorder, unspecified: Secondary | ICD-10-CM | POA: Insufficient documentation

## 2024-04-05 DIAGNOSIS — F1920 Other psychoactive substance dependence, uncomplicated: Secondary | ICD-10-CM

## 2024-04-05 DIAGNOSIS — Z5112 Encounter for antineoplastic immunotherapy: Secondary | ICD-10-CM | POA: Insufficient documentation

## 2024-04-05 DIAGNOSIS — C773 Secondary and unspecified malignant neoplasm of axilla and upper limb lymph nodes: Secondary | ICD-10-CM | POA: Insufficient documentation

## 2024-04-05 LAB — COMPREHENSIVE METABOLIC PANEL WITH GFR
ALT: 46 U/L — ABNORMAL HIGH (ref 0–44)
AST: 33 U/L (ref 15–41)
Albumin: 4 g/dL (ref 3.5–5.0)
Alkaline Phosphatase: 79 U/L (ref 38–126)
Anion gap: 12 (ref 5–15)
BUN: 17 mg/dL (ref 6–20)
CO2: 26 mmol/L (ref 22–32)
Calcium: 9.6 mg/dL (ref 8.9–10.3)
Chloride: 101 mmol/L (ref 98–111)
Creatinine, Ser: 0.7 mg/dL (ref 0.44–1.00)
GFR, Estimated: >60 mL/min (ref >60)
Glucose, Bld: 125 mg/dL — ABNORMAL HIGH (ref 70–99)
Potassium: 3.7 mmol/L (ref 3.5–5.1)
Sodium: 139 mmol/L (ref 135–145)
Total Bilirubin: 0.4 mg/dL (ref 0.0–1.2)
Total Protein: 7.3 g/dL (ref 6.5–8.1)

## 2024-04-05 LAB — CBC WITH DIFFERENTIAL/PLATELET
Abs Immature Granulocytes: 0.01 K/uL (ref 0.00–0.07)
Basophils Absolute: 0 K/uL (ref 0.0–0.1)
Basophils Relative: 1 %
Eosinophils Absolute: 0.2 K/uL (ref 0.0–0.5)
Eosinophils Relative: 3 %
HCT: 38.4 % (ref 36.0–46.0)
Hemoglobin: 12.6 g/dL (ref 12.0–15.0)
Immature Granulocytes: 0 %
Lymphocytes Relative: 31 %
Lymphs Abs: 1.5 K/uL (ref 0.7–4.0)
MCH: 32.8 pg (ref 26.0–34.0)
MCHC: 32.8 g/dL (ref 30.0–36.0)
MCV: 100 fL (ref 80.0–100.0)
Monocytes Absolute: 0.5 K/uL (ref 0.1–1.0)
Monocytes Relative: 9 %
Neutro Abs: 2.7 K/uL (ref 1.7–7.7)
Neutrophils Relative %: 56 %
Platelets: 265 K/uL (ref 150–400)
RBC: 3.84 MIL/uL — ABNORMAL LOW (ref 3.87–5.11)
RDW: 13.3 % (ref 11.5–15.5)
WBC: 4.9 K/uL (ref 4.0–10.5)
nRBC: 0 % (ref 0.0–0.2)

## 2024-04-05 LAB — MAGNESIUM: Magnesium: 2 mg/dL (ref 1.7–2.4)

## 2024-04-05 LAB — RAPID URINE DRUG SCREEN, HOSP PERFORMED
Amphetamines: NOT DETECTED
Barbiturates: NOT DETECTED
Benzodiazepines: POSITIVE — AB
Cocaine: NOT DETECTED
Opiates: NOT DETECTED
Tetrahydrocannabinol: NOT DETECTED

## 2024-04-05 MED ORDER — ALPRAZOLAM 0.5 MG PO TABS: 0.5000 mg | ORAL_TABLET | Freq: Two times a day (BID) | ORAL | 0 refills | Status: DC | PRN

## 2024-04-05 MED ORDER — FAMOTIDINE IN NACL 20-0.9 MG/50ML-% IV SOLN
20.0000 mg | Freq: Once | INTRAVENOUS | Status: AC
  Administered 2024-04-05: 20 mg via INTRAVENOUS
  Filled 2024-04-05: qty 50

## 2024-04-05 MED ORDER — DEXTROSE 5 % IV SOLN
Freq: Once | INTRAVENOUS | Status: AC
Start: 2024-04-05 — End: 2024-04-05

## 2024-04-05 MED ORDER — SODIUM CHLORIDE 0.9 % IV SOLN
150.0000 mg | Freq: Once | INTRAVENOUS | Status: AC
  Administered 2024-04-05: 150 mg via INTRAVENOUS
  Filled 2024-04-05: qty 150

## 2024-04-05 MED ORDER — OXYCODONE-ACETAMINOPHEN 7.5-325 MG PO TABS: 1.0000 | ORAL_TABLET | Freq: Four times a day (QID) | ORAL | 0 refills | Status: DC | PRN

## 2024-04-05 MED ORDER — DEXAMETHASONE SODIUM PHOSPHATE 10 MG/ML IJ SOLN
10.0000 mg | Freq: Once | INTRAMUSCULAR | Status: AC
  Administered 2024-04-05: 10 mg via INTRAVENOUS
  Filled 2024-04-05: qty 1

## 2024-04-05 MED ORDER — ACETAMINOPHEN 325 MG PO TABS
650.0000 mg | ORAL_TABLET | Freq: Once | ORAL | Status: AC
Start: 2024-04-05 — End: 2024-04-05
  Administered 2024-04-05: 650 mg via ORAL
  Filled 2024-04-05: qty 2

## 2024-04-05 MED ORDER — FAM-TRASTUZUMAB DERUXTECAN-NXKI CHEMO 100 MG IV SOLR
5.4000 mg/kg | Freq: Once | INTRAVENOUS | Status: AC
  Administered 2024-04-05: 300 mg via INTRAVENOUS
  Filled 2024-04-05: qty 15

## 2024-04-05 MED ORDER — PALONOSETRON HCL INJECTION 0.25 MG/5ML
0.2500 mg | Freq: Once | INTRAVENOUS | Status: AC
  Administered 2024-04-05: 0.25 mg via INTRAVENOUS
  Filled 2024-04-05: qty 5

## 2024-04-05 NOTE — Assessment & Plan Note (Addendum)
 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  Had multiple brain lesions at that time. Stable disease since then to 01/2024 Recent MRI with multiple new brain lesions s/p SRS  - Continue to follow-up with Dr. Patrcia with radiation oncology - Repeat MRI brain scheduled for October 2025 -Will continue to obtain frequent MRIs every 3 to 6 months

## 2024-04-05 NOTE — Patient Instructions (Signed)

## 2024-04-05 NOTE — Progress Notes (Signed)
 Patient presents today for chemotherapy infusion. Patient is in satisfactory condition with no new complaints voiced.  HR at arrival was 113.  All other vitals stable.  Labs reviewed by Dr. Davonna during the office visit and all labs are within treatment parameters.  We will proceed with treatment per MD orders.   HR at recheck was 106.  Ok to proceed per Dr. Davonna.    Patient tolerated treatment well with no complaints voiced.  Patient left ambulatory in stable condition.  Vital signs stable at discharge.  Follow up as scheduled.

## 2024-04-05 NOTE — Assessment & Plan Note (Addendum)
 Patient with HER2 positive metastatic breast carcinoma initially diagnosed in 10/07/2021 S/p 4 cycles of TCHP and then was lost to follow-up Recurrence with metastasis in 11/05/2022 s/p cerebellar metastatic resection and was started on Enhertu  Tolerating Enhertu  well but has brain metastasis undergoing SRS.  Rest of the metastatic areas in liver and bone had significant treatment response.  -Continue CT scan every 3 months.  Next to 04/2024 -Continue current treatment with Enhertu .  Tolerating well.  Cycle 17-day 1 today.   -Labs and physical exam stable today, continue with treatment.  CMP, CBC: WNL -Echo from 01/2024 normal.  Repeat in 6 months. - Recommended dental clearance to start bone modifying agents  Return to clinic in 6 weeks for follow-up and next treatment.  Will review scans at that time.

## 2024-04-05 NOTE — Patient Instructions (Signed)
 [facility] [place] - A DEPT OF [facility]  Discharge Instructions: Thank you for choosing [facility] to provide your oncology and hematology care.  If you have a lab appointment with the Cancer Center - please note that after April 8th, 2024, all labs will be drawn in the cancer center.  You do not have to check in or register with the main entrance as you have in the past but will complete your check-in in the cancer center.  Wear comfortable clothing and clothing appropriate for easy access to any Portacath or PICC line.   We strive to give you quality time with your provider. You may need to reschedule your appointment if you arrive late (15 or more minutes).  Arriving late affects you and other patients whose appointments are after yours.  Also, if you miss three or more appointments without notifying the office, you may be dismissed from the clinic at the provider's discretion.      For prescription refill requests, have your pharmacy contact our office and allow 72 hours for refills to be completed.    Today you received the following chemotherapy and/or immunotherapy agents Enhertu. Fam-Trastuzumab Deruxtecan Injection What is this medication? FAM-TRASTUZUMAB DERUXTECAN (fam-tras TOOZ eu mab DER ux TEE kan) treats some types of cancer. It works by blocking a protein that causes cancer cells to grow and multiply. This helps to slow or stop the spread of cancer cells. This medicine may be used for other purposes; ask your health care provider or pharmacist if you have questions. COMMON BRAND NAME(S): ENHERTU What should I tell my care team before I take this medication? They need to know if you have any of these conditions: Heart disease Heart failure Infection, especially a viral infection, such as chickenpox, cold sores, or herpes Liver disease Lung or breathing disease, such as asthma or COPD An unusual or allergic reaction to fam-trastuzumab  deruxtecan, other medications, foods, dyes, or preservatives Pregnant or trying to get pregnant Breast-feeding How should I use this medication? This medication is injected into a vein. It is given by your care team in a hospital or clinic setting. A special MedGuide will be given to you before each treatment. Be sure to read this information carefully each time. Talk to your care team about the use of this medication in children. Special care may be needed. Overdosage: If you think you have taken too much of this medicine contact a poison control center or emergency room at once. NOTE: This medicine is only for you. Do not share this medicine with others. What if I miss a dose? It is important not to miss your dose. Call your care team if you are unable to keep an appointment. What may interact with this medication? Interactions are not expected. This list may not describe all possible interactions. Give your health care provider a list of all the medicines, herbs, non-prescription drugs, or dietary supplements you use. Also tell them if you smoke, drink alcohol, or use illegal drugs. Some items may interact with your medicine. What should I watch for while using this medication? Visit your care team for regular checks on your progress. Tell your care team if your symptoms do not start to get better or if they get worse. This medication may increase your risk of getting an infection. Call your care team for advice if you get a fever, chills, sore throat, or other symptoms of a cold or flu. Do  not treat yourself. Try to avoid being around people who are sick. Avoid taking medications that contain aspirin, acetaminophen, ibuprofen, naproxen, or ketoprofen unless instructed by your care team. These medications may hide a fever. Be careful brushing or flossing your teeth or using a toothpick because you may get an infection or bleed more easily. If you have any dental work done, tell your dentist you  are receiving this medication. This medication may cause dry eyes and blurred vision. If you wear contact lenses, you may feel some discomfort. Lubricating eye drops may help. See your care team if the problem does not go away or is severe. Talk to your care team if you may be pregnant. Serious birth defects can occur if you take this medication during pregnancy and for 7 months after the last dose. If your partner can get pregnant, use a condom during sex while taking this medication and for 4 months after the last dose. Do not breastfeed while taking this medication and for 7 months after the last dose. This medication may cause infertility. Talk to your care team if you are concerned about your fertility. What side effects may I notice from receiving this medication? Side effects that you should report to your care team as soon as possible: Allergic reactions--skin rash, itching, hives, swelling of the face, lips, tongue, or throat Dry cough, shortness of breath or trouble breathing Infection--fever, chills, cough, sore throat, wounds that don't heal, pain or trouble when passing urine, general feeling of discomfort or being unwell Heart failure--shortness of breath, swelling of the ankles, feet, or hands, sudden weight gain, unusual weakness or fatigue Unusual bruising or bleeding Side effects that usually do not require medical attention (report these to your care team if they continue or are bothersome): Constipation Diarrhea Hair loss Muscle pain Nausea Vomiting This list may not describe all possible side effects. Call your doctor for medical advice about side effects. You may report side effects to FDA at 1-800-FDA-1088. Where should I keep my medication? This medication is given in a hospital or clinic. It will not be stored at home. NOTE: This sheet is a summary. It may not cover all possible information. If you have questions about this medicine, talk to your doctor, pharmacist, or  health care provider.  2024 Elsevier/Gold Standard (2023-04-03 00:00:00)      To help prevent nausea and vomiting after your treatment, we encourage you to take your nausea medication as directed.  BELOW ARE SYMPTOMS THAT SHOULD BE REPORTED IMMEDIATELY: *FEVER GREATER THAN 100.4 F (38 C) OR HIGHER *CHILLS OR SWEATING *NAUSEA AND VOMITING THAT IS NOT CONTROLLED WITH YOUR NAUSEA MEDICATION *UNUSUAL SHORTNESS OF BREATH *UNUSUAL BRUISING OR BLEEDING *URINARY PROBLEMS (pain or burning when urinating, or frequent urination) *BOWEL PROBLEMS (unusual diarrhea, constipation, pain near the anus) TENDERNESS IN MOUTH AND THROAT WITH OR WITHOUT PRESENCE OF ULCERS (sore throat, sores in mouth, or a toothache) UNUSUAL RASH, SWELLING OR PAIN  UNUSUAL VAGINAL DISCHARGE OR ITCHING   Items with * indicate a potential emergency and should be followed up as soon as possible or go to the Emergency Department if any problems should occur.  Please show the CHEMOTHERAPY ALERT CARD or IMMUNOTHERAPY ALERT CARD at check-in to the Emergency Department and triage nurse.  Should you have questions after your visit or need to cancel or reschedule your appointment, please contact [facility] [place] - A DEPT OF Eligha Bridegroom [facility] [phone]  and follow the prompts.  Office  hours are 8:00 a.m. to 4:30 p.m. Monday - Friday. Please note that voicemails left after 4:00 p.m. may not be returned until the following business day.  We are closed weekends and major holidays. You have access to a nurse at all times for urgent questions. Please call the main number to the clinic [phone] and follow the prompts.  For any non-urgent questions, you may also contact your provider using MyChart. We now offer e-Visits for anyone 55 and older to request care online for non-urgent symptoms. For details visit [email or web address].   Also download the MyChart app! Go to the app store, search "MyChart", open the app,  select [place], and log in with your MyChart username and password.

## 2024-04-05 NOTE — Assessment & Plan Note (Addendum)
 Taking Xanax  0.5 mg twice daily as needed   - Continue Xanax  0.5 mg twice a day as needed

## 2024-04-05 NOTE — Assessment & Plan Note (Addendum)
 Patient has a history of hypertension and was taking clonidine 0.1 mg twice daily Unknown who prescribed it and how often patient was taking it Blood pressure today in the clinic was normal and she reported taking half of clonidine this morning Prescribed amlodipine  10 mg daily last visit.  Patient is currently only taking half the pill. Blood pressure normal today.  - Continue amlodipine  5 mg daily.  Will send a new prescription - Recommended follow-up with primary care

## 2024-04-05 NOTE — Progress Notes (Signed)
 Patient has been examined by Dr. Davonna. Vital signs (HR 113) and labs have been reviewed by MD - ANC, Creatinine, LFTs, hemoglobin, and platelets have been reviewed by M.D. - pt may proceed with treatment.  Primary RN and pharmacy notified.

## 2024-04-05 NOTE — Progress Notes (Addendum)
 Patient Care Team: Orpha Yancey LABOR, MD as PCP - General (Internal Medicine) Celestia Joesph SQUIBB, RN as Oncology Nurse Navigator (Oncology)  Clinic Day:  04/05/2024  Referring physician: Orpha Yancey LABOR, MD   CHIEF COMPLAINT:  CC: HER2 positive metastatic breast carcinoma  Sara [name] 48 y.o. female was transferred to my care after her prior physician has left.   ASSESSMENT & PLAN:   Assessment & Plan: [name]  is a 48 y.o. female with HER2 positive metastatic breast carcinoma  Assessment & Plan HER2-positive carcinoma of right breast [facility]) Patient with HER2 positive metastatic breast carcinoma initially diagnosed in 10/07/2021 S/p 4 cycles of TCHP and then was lost to follow-up Recurrence with metastasis in 11/05/2022 s/p cerebellar metastatic resection and was started on Enhertu  Tolerating Enhertu  well but has brain metastasis undergoing SRS.  Rest of the metastatic areas in liver and bone had significant treatment response.  -Continue CT scan every 3 months.  Next to 04/2024 -Continue current treatment with Enhertu .  Tolerating well.  Cycle 17-day 1 today.   -Labs and physical exam stable today, continue with treatment.  CMP, CBC: WNL -Echo from 01/2024 normal.  Repeat in 6 months. - Recommended dental clearance to start bone modifying agents  Return to clinic in 6 weeks for follow-up and next treatment.  Will review scans at that time. 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  Had multiple brain lesions at that time. Stable disease since then to 01/2024 Recent MRI with multiple new brain lesions s/p SRS  - Continue to follow-up with Dr. Patrcia with radiation oncology - Repeat MRI brain scheduled for October 2025 -Will continue to obtain frequent MRIs every 3 to 6 months Secondary hypertension Patient has a history of hypertension and was taking clonidine 0.1 mg twice daily Unknown  who prescribed it and how often patient was taking it Blood pressure today in the clinic was normal and she reported taking half of clonidine this morning Prescribed amlodipine  10 mg daily last visit.  Patient is currently only taking half the pill. Blood pressure normal today.  - Continue amlodipine  5 mg daily.  Will send a new prescription - Recommended follow-up with primary care Anxiety Taking Xanax  0.5 mg twice daily as needed   - Continue Xanax  0.5 mg twice a day as needed Neoplasm related pain Patient is currently taking Percocet 7.5 mg twice daily as needed   - Continue Percocet 7.5 mg twice daily  The patient understands the plans discussed today and is in agreement with them.  She knows to contact our office if she develops concerns prior to her next appointment.  I provided 20 minutes of face-to-face time during this encounter and > 50% was spent counseling as documented under my assessment and plan.    [name],acting as a Neurosurgeon for Mickiel Dry, MD.,have documented all relevant documentation on the behalf of Mickiel Dry, MD,as directed by  Mickiel Dry, MD while in the presence of Mickiel Dry, MD.  I, Mickiel Dry MD, have reviewed the above documentation for accuracy and completeness, and I agree with the above.     Mickiel Dry, MD   C[facility]ENTER [facility] [place] - A DEPT OF [facility] [address] [place] [address] Dept: [phone] Dept Fax: [phone]   Orders Placed This Encounter  Procedures   CT CHEST ABDOMEN PELVIS W CONTRAST  Standing Status:   Future    Expected Date:   05/06/2024    Expiration Date:   04/05/2025    If indicated for the ordered procedure, I authorize the administration of contrast media per Radiology protocol:   Yes    Does the patient have a contrast media/X-ray dye allergy?:   No    Preferred imaging location?:   [facility]    If indicated  for the ordered procedure, I authorize the administration of oral contrast media per Radiology protocol:   Yes   Cancer antigen 15-3   Cancer antigen 27.29     ONCOLOGY HISTORY:   Oncology History  HER2-positive carcinoma of right breast (HCC)  10/08/2021 Pathology Results   FINAL MICROSCOPIC DIAGNOSIS:   A. LYMPH NODE AXILLARY, RIGHT, BIOPSY:      - Mammary carcinoma, poorly differentiated (see comment).   B. BREAST, MASS, RIGHT, BIOPSY:      - Mammary carcinoma, poorly differentiated (see comment).   Comment: Positive for E-cadherin. ER: Positive, 5% PR: Negative Ki-67: 25% HER2: Positive [3+]   10/28/2021 Initial Diagnosis   Breast cancer, right breast (HCC)   12/03/2021 - 02/25/2022 Chemotherapy   Patient is on Treatment Plan : BREAST DOCEtaxel  + Trastuzumab  + Pertuzumab  (THP) q21d x 8 cycles / Trastuzumab  + Pertuzumab  q21d x 4 cycles     11/10/2022 Imaging   CT CAP with contrast:  IMPRESSION: 1. Significant improved appearance of the right breast. No recurrent mass or obvious chest wall invasion. There are small enhancing nodules. Recommend close observation. 2. No supraclavicular, axillary, mediastinal or hilar adenopathy. No findings for pulmonary metastatic disease. 3. No residual hepatic lesions suggesting a good response to treatment. 4. Interval healing changes of the lytic bone lesions. No pathologic fracture or spinal canal compromise.   11/12/2022 Procedure   Cerebellar mass resection   11/12/2022 Pathology Results   FINAL MICROSCOPIC DIAGNOSIS:   A. CEREBELLAR MASS, RESECTION:  - Metastatic carcinoma   B. CEREBELLAR MASS, RESECTION:  - Metastatic carcinoma (see Comment)   COMMENT:   - Appropriately controlled immunohistochemical stains show tumor cells are positive for CK7 and GATA-3 indicating a breast primary. ER and CK  20 are negative.  - HER2:  Positive 3+   12/03/2022 Imaging   MRI brain with and without contrast:  IMPRESSION: 1. Mild interval  increase in size of a right parietal enhancing lesion, now measuring approximately 12 x 13 mm (previously 11 x 10 mm). Mild increase in surrounding edema. 2. New punctate focus a of enhancement in the right caudate, concerning for new metastasis. 3. Prior right cerebellar metastatic lesion resection without evidence of residual lesion at this site. Herniation of a small left cerebellum through the craniotomy defect, not substantially changed.   01/01/2023 -  Chemotherapy   Patient is on Treatment Plan : BREAST METASTATIC Fam-Trastuzumab Deruxtecan-nxki  (Enhertu ) (5.4) q21d     05/02/2023 Imaging   MRI brain with and without contrast:  IMPRESSION: 1. New punctate focus of enhancement in the lateral high right parietal lobe, suspicious for new metastasis. 2. Possible new punctate focus of enhancement in the or superior right parietal lobe, which could be a new metastasis or small vessel. 3. Interval decrease in size of the more medial left parietal lesion. 4. Similar punctate focus of enhancement in the right caudate. 5. Similar postoperative changes in the right cerebellum without mass like enhancement.   06/04/2023 Imaging   CT CAP with contrast:   IMPRESSION: 1. Mild, persistent skin  thickening of the right breast. Enhancing nodularity throughout the glandular tissue of the right breast seen on prior examination is no longer appreciated, consistent with treatment response. 2. Minimal fine, scattered ground-glass throughout the upper lobes. Appearance on prior examination is suggestive of lymphangitic metastatic disease in the setting of otherwise widespread metastatic breast malignancy. 3. No evidence of residual or recurrent hepatic metastatic disease. 4. Unusually, patient has essentially no visible evidence of previously established widespread lytic osseous metastatic disease   09/14/2023 Imaging   MRI brain with and without contrast:  IMPRESSION: 1. Stable appearance of the brain  since 05/02/2023. Postoperative changes in the right cerebellum without evidence of residual or recurrent viable tumor. 2. Stable treated metastasis in the medial right parietal lobe. 3. Stable punctate focus of enhancement in the right caudate head. 4. Stable punctate focus of enhancement along the surface of the right parietal lobe. 5. No new lesion.   09/28/2023 Imaging   CT CAP with contrast:  IMPRESSION: 1. Stable examination without evidence of new or progressive disease in the chest, abdomen or pelvis. 2. Continued improvement of the irregular nodularity and interlobular septal thickening. Minimal fine scattered ground-glass opacities have decreased in the interval. 3. As on prior examination there is no definitive visual CT evidence of prior osseous metastatic disease seen on PET-CT November 28, 2021, consider further evaluation by nuclear medicine bone scan.   02/02/2024 Imaging   MRI brain with and without contrast:  IMPRESSION: 1. There are multiple new small cerebral metastases present. The previously demonstrated metastatic lesions within the right parietal lobe and caudate evident not changed significantly. 2. Stable postoperative encephalomalacia changes within the right cerebellar hemisphere.   02/08/2024 Imaging   ECHO: EF:60-65%   02/11/2024 Imaging   CT CAP with contrast:  IMPRESSION: 1. No evidence of recurrent or metastatic disease in the chest, abdomen, or pelvis. 2. Minimal fine ground-glass scattered throughout the upper lobes and mild diffuse bronchial wall thickening, unchanged. 3. 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 4. Prominent left ovarian and adnexal varices with a prominent left ovarian vein.       Current Treatment:  Enhertu   INTERVAL HISTORY:   VELENCIA LENART is here today for follow up. She reports feeling off-balance today and equilibrium issues. She attributes this to having chemotherapy and radiation the same day on  03/14/2024. Alichia notes left hip pain attributable to lytic bone lesion to that area. She reports right-sided chest pain and states Percocet, Remeron , and xanax  are somewhat effective in improving pain. She denies any cough or dyspnea.  Brier is taking half  of 10 mg pills of norvasc  daily and states the 10 mg dose caused vision issues. She is also alternatively taking vitamin D and calcium supplements.    I have reviewed the past medical history, past surgical history, social history and family history with the patient and they are unchanged from previous note.  ALLERGIES:  is allergic to hydrocodone , sulfa antibiotics, and morphine  and codeine.  MEDICATIONS:  Current Outpatient Medications  Medication Sig Dispense Refill   aluminum -magnesium  hydroxide 200-200 MG/5ML suspension Take 15 mLs by mouth every 6 (six) hours as needed for indigestion (Mix with 15 ml of lidocaine  2% and swish and spit). 355 mL 2   amLODipine  (NORVASC ) 10 MG tablet Take 1 tablet (10 mg total) by mouth daily. 30 tablet 2   docusate sodium  (COLACE) 100 MG capsule Take 1 capsule (100 mg total) by mouth 2 (two) times daily.  furosemide  (LASIX ) 20 MG tablet Take 1 tablet (20 mg total) by mouth daily as needed. 30 tablet 0   lidocaine  (XYLOCAINE ) 2 % solution Use as directed 15 mLs in the mouth or throat every 6 (six) hours as needed for mouth pain (Mix with 15 ml of maalox and swish and spit). 100 mL 2   lidocaine -prilocaine  (EMLA ) cream Apply 1 Application topically as needed. 30 g 0   mirtazapine  (REMERON ) 15 MG tablet Take 1 tablet (15 mg total) by mouth at bedtime. 30 tablet 2   ondansetron  (ZOFRAN ) 8 MG tablet TAKE 1 TABLET BY MOUTH EVERY 8 HOURS AS NEEDED FOR NAUSEA FOR VOMITING 90 tablet 5   pantoprazole  (PROTONIX ) 20 MG tablet Take 1 tablet (20 mg total) by mouth daily. 30 tablet 5   prochlorperazine  (COMPAZINE ) 10 MG tablet Take 1 tablet (10 mg total) by mouth every 6 (six) hours as needed for nausea or  vomiting. 30 tablet 5   ALPRAZolam  (XANAX ) 0.5 MG tablet Take 1 tablet (0.5 mg total) by mouth 2 (two) times daily as needed for anxiety. 60 tablet 0   oxyCODONE -acetaminophen  (PERCOCET) 7.5-325 MG tablet Take 1 tablet by mouth every 6 (six) hours as needed for severe pain (pain score 7-10). 84 tablet 0   No current facility-administered medications for this visit.    REVIEW OF SYSTEMS:   Constitutional: Denies fevers, chills or abnormal weight loss Eyes: Denies blurriness of vision Ears, nose, mouth, throat, and face: Denies mucositis or sore throat Respiratory: Denies cough, dyspnea or wheezes Cardiovascular: Positive for right sided chest pain, Denies palpitation, chest discomfort or lower extremity swelling Gastrointestinal: Positive for diarrhea, nausea, and vomiting, Denies nausea, heartburn or change in bowel habits Skin: Denies abnormal skin rashes Lymphatics: Denies new lymphadenopathy or easy bruising Neurological: Positive for dizziness and headaches, Denies numbness, tingling or new weaknesses Behavioral/Psych: Mood is stable, no new changes  All other systems were reviewed with the patient and are negative.   VITALS:  There were no vitals taken for this visit.  Wt Readings from Last 3 Encounters:  04/05/24 128 lb (58.1 kg)  03/14/24 128 lb 15.5 oz (58.5 kg)  02/18/24 127 lb 6.8 oz (57.8 kg)    There is no height or weight on file to calculate BMI.  Performance status (ECOG): 1 - Symptomatic but completely ambulatory  PHYSICAL EXAM:   GENERAL:alert, no distress and comfortable SKIN: skin color, texture, turgor are normal, no rashes or significant lesions BREAST: Right breast: No masses palpated, no discharge, some hypopigmentation of the nipple.  Left breast: Normal breast exam LYMPH:  no palpable lymphadenopathy in the cervical, axillary or inguinal LUNGS: clear to auscultation and percussion with normal breathing effort HEART: regular rate & rhythm and no murmurs  and no lower extremity edema ABDOMEN:abdomen soft, non-tender and normal bowel sounds Musculoskeletal:no cyanosis of digits and no clubbing  NEURO: alert & oriented x 3 with fluent speech  LABORATORY DATA:  I have reviewed the data as listed    Component Value Date/Time   NA 139 04/05/2024 0944   K 3.7 04/05/2024 0944   CL 101 04/05/2024 0944   CO2 26 04/05/2024 0944   GLUCOSE 125 (H) 04/05/2024 0944   BUN 17 04/05/2024 0944   CREATININE 0.70 04/05/2024 0944   CALCIUM 9.6 04/05/2024 0944   PROT 7.3 04/05/2024 0944   ALBUMIN  4.0 04/05/2024 0944   AST 33 04/05/2024 0944   ALT 46 (H) 04/05/2024 0944   ALKPHOS 79 04/05/2024 0944  BILITOT 0.4 04/05/2024 0944   GFRNONAA >60 04/05/2024 0944   GFRAA >60 10/25/2019 1224    Lab Results  Component Value Date   WBC 4.9 04/05/2024   NEUTROABS 2.7 04/05/2024   HGB 12.6 04/05/2024   HCT 38.4 04/05/2024   MCV 100.0 04/05/2024   PLT 265 04/05/2024      Chemistry      Component Value Date/Time   NA 139 04/05/2024 0944   K 3.7 04/05/2024 0944   CL 101 04/05/2024 0944   CO2 26 04/05/2024 0944   BUN 17 04/05/2024 0944   CREATININE 0.70 04/05/2024 0944      Component Value Date/Time   CALCIUM 9.6 04/05/2024 0944   ALKPHOS 79 04/05/2024 0944   AST 33 04/05/2024 0944   ALT 46 (H) 04/05/2024 0944   BILITOT 0.4 04/05/2024 0944       RADIOGRAPHIC STUDIES: I have personally reviewed the radiological images as listed and agreed with the findings in the report.  None new to review

## 2024-04-05 NOTE — Assessment & Plan Note (Addendum)
 Patient is currently taking Percocet 7.5 mg twice daily as needed   - Continue Percocet 7.5 mg twice daily

## 2024-04-06 LAB — CANCER ANTIGEN 27.29: CA 27.29: 18.5 U/mL (ref 0.0–38.6)

## 2024-04-06 LAB — CANCER ANTIGEN 15-3: CA 15-3: 14.2 U/mL (ref 0.0–25.0)

## 2024-04-22 ENCOUNTER — Encounter (HOSPITAL_COMMUNITY): Payer: Self-pay | Admitting: Family Medicine

## 2024-04-26 ENCOUNTER — Inpatient Hospital Stay: Payer: MEDICAID

## 2024-05-03 ENCOUNTER — Inpatient Hospital Stay: Payer: MEDICAID

## 2024-05-03 ENCOUNTER — Telehealth: Payer: Self-pay | Admitting: Licensed Clinical Social Worker

## 2024-05-03 NOTE — Telephone Encounter (Signed)
 [facility] Clinical Social Work  Pt left VM asking for a call back from primary CSW S. Eder who is out of the office. This CSW attempted to reach pt by phone.  No answer. Left VM with direct contact information and that S. Eder will be back in the office later this week.   Talin Feister E Anurag Scarfo, LCSW

## 2024-05-05 ENCOUNTER — Other Ambulatory Visit: Payer: Self-pay | Admitting: Oncology

## 2024-05-05 ENCOUNTER — Inpatient Hospital Stay: Payer: MEDICAID | Attending: Hematology

## 2024-05-05 ENCOUNTER — Ambulatory Visit: Payer: MEDICAID

## 2024-05-05 ENCOUNTER — Encounter: Payer: Self-pay | Admitting: Oncology

## 2024-05-05 ENCOUNTER — Other Ambulatory Visit: Payer: Self-pay | Admitting: *Deleted

## 2024-05-05 VITALS — BP 108/73 | HR 83 | Temp 97.6°F | Resp 18

## 2024-05-05 DIAGNOSIS — C50911 Malignant neoplasm of unspecified site of right female breast: Secondary | ICD-10-CM | POA: Diagnosis not present

## 2024-05-05 DIAGNOSIS — Z5112 Encounter for antineoplastic immunotherapy: Secondary | ICD-10-CM | POA: Diagnosis present

## 2024-05-05 DIAGNOSIS — Z1731 Human epidermal growth factor receptor 2 positive status: Secondary | ICD-10-CM | POA: Insufficient documentation

## 2024-05-05 DIAGNOSIS — F419 Anxiety disorder, unspecified: Secondary | ICD-10-CM

## 2024-05-05 DIAGNOSIS — G893 Neoplasm related pain (acute) (chronic): Secondary | ICD-10-CM

## 2024-05-05 DIAGNOSIS — C7931 Secondary malignant neoplasm of brain: Secondary | ICD-10-CM | POA: Insufficient documentation

## 2024-05-05 LAB — COMPREHENSIVE METABOLIC PANEL WITH GFR
ALT: 22 U/L (ref 0–44)
AST: 29 U/L (ref 15–41)
Albumin: 3.8 g/dL (ref 3.5–5.0)
Alkaline Phosphatase: 74 U/L (ref 38–126)
Anion gap: 13 (ref 5–15)
BUN: 16 mg/dL (ref 6–20)
CO2: 24 mmol/L (ref 22–32)
Calcium: 9.3 mg/dL (ref 8.9–10.3)
Chloride: 105 mmol/L (ref 98–111)
Creatinine, Ser: 0.76 mg/dL (ref 0.44–1.00)
GFR, Estimated: >60 mL/min (ref >60)
Glucose, Bld: 107 mg/dL — ABNORMAL HIGH (ref 70–99)
Potassium: 4 mmol/L (ref 3.5–5.1)
Sodium: 142 mmol/L (ref 135–145)
Total Bilirubin: 0.4 mg/dL (ref 0.0–1.2)
Total Protein: 7.5 g/dL (ref 6.5–8.1)

## 2024-05-05 LAB — CBC WITH DIFFERENTIAL/PLATELET
Abs Immature Granulocytes: 0.09 K/uL — ABNORMAL HIGH (ref 0.00–0.07)
Basophils Absolute: 0.1 K/uL (ref 0.0–0.1)
Basophils Relative: 1 %
Eosinophils Absolute: 0.3 K/uL (ref 0.0–0.5)
Eosinophils Relative: 3 %
HCT: 38.7 % (ref 36.0–46.0)
Hemoglobin: 12.8 g/dL (ref 12.0–15.0)
Immature Granulocytes: 1 %
Lymphocytes Relative: 24 %
Lymphs Abs: 2.5 K/uL (ref 0.7–4.0)
MCH: 33.2 pg (ref 26.0–34.0)
MCHC: 33.1 g/dL (ref 30.0–36.0)
MCV: 100.5 fL — ABNORMAL HIGH (ref 80.0–100.0)
Monocytes Absolute: 0.9 K/uL (ref 0.1–1.0)
Monocytes Relative: 9 %
Neutro Abs: 6.4 K/uL (ref 1.7–7.7)
Neutrophils Relative %: 62 %
Platelets: 270 K/uL (ref 150–400)
RBC: 3.85 MIL/uL — ABNORMAL LOW (ref 3.87–5.11)
RDW: 13.3 % (ref 11.5–15.5)
WBC: 10.2 K/uL (ref 4.0–10.5)
nRBC: 0 % (ref 0.0–0.2)

## 2024-05-05 LAB — MAGNESIUM: Magnesium: 2.1 mg/dL (ref 1.7–2.4)

## 2024-05-05 LAB — RAPID URINE DRUG SCREEN, HOSP PERFORMED
Amphetamines: NOT DETECTED
Barbiturates: NOT DETECTED
Benzodiazepines: POSITIVE — AB
Cocaine: NOT DETECTED
Opiates: NOT DETECTED
Tetrahydrocannabinol: NOT DETECTED

## 2024-05-05 MED ORDER — ACETAMINOPHEN 325 MG PO TABS
650.0000 mg | ORAL_TABLET | Freq: Once | ORAL | Status: AC
  Administered 2024-05-05: 650 mg via ORAL
  Filled 2024-05-05: qty 2

## 2024-05-05 MED ORDER — PROCHLORPERAZINE MALEATE 10 MG PO TABS: 10.0000 mg | ORAL_TABLET | Freq: Four times a day (QID) | ORAL | 5 refills | Status: AC | PRN

## 2024-05-05 MED ORDER — DEXAMETHASONE SODIUM PHOSPHATE 10 MG/ML IJ SOLN
10.0000 mg | Freq: Once | INTRAMUSCULAR | Status: AC
  Administered 2024-05-05: 10 mg via INTRAVENOUS
  Filled 2024-05-05: qty 1

## 2024-05-05 MED ORDER — ALPRAZOLAM 0.5 MG PO TABS: 0.5000 mg | ORAL_TABLET | Freq: Two times a day (BID) | ORAL | 0 refills | Status: DC | PRN

## 2024-05-05 MED ORDER — ONDANSETRON HCL 8 MG PO TABS: ORAL_TABLET | ORAL | 5 refills | Status: DC

## 2024-05-05 MED ORDER — DEXTROSE 5 % IV SOLN: Freq: Once | INTRAVENOUS | Status: AC

## 2024-05-05 MED ORDER — OXYCODONE-ACETAMINOPHEN 7.5-325 MG PO TABS: 1.0000 | ORAL_TABLET | Freq: Four times a day (QID) | ORAL | 0 refills | Status: DC | PRN

## 2024-05-05 MED ORDER — PALONOSETRON HCL INJECTION 0.25 MG/5ML
0.2500 mg | Freq: Once | INTRAVENOUS | Status: AC
  Administered 2024-05-05: 0.25 mg via INTRAVENOUS
  Filled 2024-05-05: qty 5

## 2024-05-05 MED ORDER — SODIUM CHLORIDE 0.9 % IV SOLN
150.0000 mg | Freq: Once | INTRAVENOUS | Status: AC
  Administered 2024-05-05: 150 mg via INTRAVENOUS
  Filled 2024-05-05: qty 150

## 2024-05-05 MED ORDER — FAMOTIDINE IN NACL 20-0.9 MG/50ML-% IV SOLN
20.0000 mg | Freq: Once | INTRAVENOUS | Status: AC
  Administered 2024-05-05: 20 mg via INTRAVENOUS
  Filled 2024-05-05: qty 50

## 2024-05-05 MED ORDER — FAM-TRASTUZUMAB DERUXTECAN-NXKI CHEMO 100 MG IV SOLR
5.4000 mg/kg | Freq: Once | INTRAVENOUS | Status: AC
  Administered 2024-05-05: 300 mg via INTRAVENOUS
  Filled 2024-05-05: qty 15

## 2024-05-05 NOTE — Patient Instructions (Signed)
 [facility] [place] - A DEPT OF [facility]  Discharge Instructions: Thank you for choosing [facility] to provide your oncology and hematology care.  If you have a lab appointment with the Cancer Center - please note that after April 8th, 2024, all labs will be drawn in the cancer center.  You do not have to check in or register with the main entrance as you have in the past but will complete your check-in in the cancer center.  Wear comfortable clothing and clothing appropriate for easy access to any Portacath or PICC line.   We strive to give you quality time with your provider. You may need to reschedule your appointment if you arrive late (15 or more minutes).  Arriving late affects you and other patients whose appointments are after yours.  Also, if you miss three or more appointments without notifying the office, you may be dismissed from the clinic at the provider's discretion.      For prescription refill requests, have your pharmacy contact our office and allow 72 hours for refills to be completed.    Today you received the following chemotherapy and/or immunotherapy agents Enhertu, return as scheduled.   To help prevent nausea and vomiting after your treatment, we encourage you to take your nausea medication as directed.  BELOW ARE SYMPTOMS THAT SHOULD BE REPORTED IMMEDIATELY: *FEVER GREATER THAN 100.4 F (38 C) OR HIGHER *CHILLS OR SWEATING *NAUSEA AND VOMITING THAT IS NOT CONTROLLED WITH YOUR NAUSEA MEDICATION *UNUSUAL SHORTNESS OF BREATH *UNUSUAL BRUISING OR BLEEDING *URINARY PROBLEMS (pain or burning when urinating, or frequent urination) *BOWEL PROBLEMS (unusual diarrhea, constipation, pain near the anus) TENDERNESS IN MOUTH AND THROAT WITH OR WITHOUT PRESENCE OF ULCERS (sore throat, sores in mouth, or a toothache) UNUSUAL RASH, SWELLING OR PAIN  UNUSUAL VAGINAL DISCHARGE OR ITCHING   Items with * indicate a potential emergency and  should be followed up as soon as possible or go to the Emergency Department if any problems should occur.  Please show the CHEMOTHERAPY ALERT CARD or IMMUNOTHERAPY ALERT CARD at check-in to the Emergency Department and triage nurse.  Should you have questions after your visit or need to cancel or reschedule your appointment, please contact [facility] [place] - A DEPT OF [facility] [phone]  and follow the prompts.  Office hours are 8:00 a.m. to 4:30 p.m. Monday - Friday. Please note that voicemails left after 4:00 p.m. may not be returned until the following business day.  We are closed weekends and major holidays. You have access to a nurse at all times for urgent questions. Please call the main number to the clinic [phone] and follow the prompts.  For any non-urgent questions, you may also contact your provider using MyChart. We now offer e-Visits for anyone 51 and older to request care online for non-urgent symptoms. For details visit [email or web address].   Also download the MyChart app! Go to the app store, search "MyChart", open the app, select [place], and log in with your MyChart username and password.

## 2024-05-05 NOTE — Progress Notes (Signed)
 Labs reviewed today, ok to proceed as planned. Ok to treat with HR of 112 per provider.

## 2024-05-05 NOTE — Progress Notes (Signed)
 Patient tolerated therapy with no complaints voiced.  Side effects with management reviewed with understanding verbalized.  Port site clean and dry with no bruising or swelling noted at site.  Good blood return noted before and after administration of therapy.  Band aid applied.  Patient left in satisfactory condition with VSS and no s/s of distress noted.

## 2024-05-06 ENCOUNTER — Ambulatory Visit (HOSPITAL_COMMUNITY)
Admission: RE | Admit: 2024-05-06 | Discharge: 2024-05-06 | Disposition: A | Discharge status: Home / Self Care | Payer: MEDICAID | Source: Ambulatory Visit | Attending: Oncology | Admitting: Oncology

## 2024-05-06 DIAGNOSIS — C50911 Malignant neoplasm of unspecified site of right female breast: Secondary | ICD-10-CM | POA: Diagnosis present

## 2024-05-06 DIAGNOSIS — C7931 Secondary malignant neoplasm of brain: Secondary | ICD-10-CM | POA: Insufficient documentation

## 2024-05-06 MED ORDER — IOHEXOL 300 MG/ML  SOLN
100.0000 mL | Freq: Once | INTRAMUSCULAR | Status: AC | PRN
  Administered 2024-05-06: 100 mL via INTRAVENOUS

## 2024-05-17 ENCOUNTER — Inpatient Hospital Stay: Payer: MEDICAID

## 2024-05-17 ENCOUNTER — Inpatient Hospital Stay: Payer: MEDICAID | Admitting: Oncology

## 2024-05-19 ENCOUNTER — Other Ambulatory Visit: Payer: Self-pay | Admitting: Oncology

## 2024-05-19 DIAGNOSIS — Z1731 Human epidermal growth factor receptor 2 positive status: Secondary | ICD-10-CM

## 2024-05-20 ENCOUNTER — Other Ambulatory Visit: Payer: Self-pay

## 2024-05-24 ENCOUNTER — Ambulatory Visit (HOSPITAL_COMMUNITY): Payer: MEDICAID | Attending: Radiation Oncology

## 2024-05-26 ENCOUNTER — Inpatient Hospital Stay: Payer: MEDICAID | Admitting: Oncology

## 2024-05-26 ENCOUNTER — Inpatient Hospital Stay: Payer: MEDICAID

## 2024-05-26 ENCOUNTER — Other Ambulatory Visit: Payer: Self-pay

## 2024-05-26 ENCOUNTER — Inpatient Hospital Stay: Payer: MEDICAID | Attending: Hematology

## 2024-05-26 DIAGNOSIS — Z5112 Encounter for antineoplastic immunotherapy: Secondary | ICD-10-CM | POA: Insufficient documentation

## 2024-05-26 DIAGNOSIS — Z170 Estrogen receptor positive status [ER+]: Secondary | ICD-10-CM | POA: Insufficient documentation

## 2024-05-26 DIAGNOSIS — C50111 Malignant neoplasm of central portion of right female breast: Secondary | ICD-10-CM | POA: Insufficient documentation

## 2024-05-26 DIAGNOSIS — Z1731 Human epidermal growth factor receptor 2 positive status: Secondary | ICD-10-CM | POA: Insufficient documentation

## 2024-05-26 DIAGNOSIS — C7951 Secondary malignant neoplasm of bone: Secondary | ICD-10-CM | POA: Insufficient documentation

## 2024-05-26 DIAGNOSIS — C7931 Secondary malignant neoplasm of brain: Secondary | ICD-10-CM | POA: Insufficient documentation

## 2024-05-26 DIAGNOSIS — C787 Secondary malignant neoplasm of liver and intrahepatic bile duct: Secondary | ICD-10-CM | POA: Insufficient documentation

## 2024-05-26 NOTE — Progress Notes (Incomplete)
 Patient Care Team: Sara Yancey LABOR, MD as PCP - General (Internal Medicine) Sara Joesph SQUIBB, RN as Oncology Nurse Navigator (Oncology)  Clinic Day:  05/26/2024  Referring physician: Orpha Yancey LABOR, MD   CHIEF COMPLAINT:  CC: HER2 positive metastatic right breast carcinoma    ASSESSMENT & PLAN:   Assessment & Plan: Sara [name]  is a 48 y.o. female with ***  Assessment & Plan HER2-positive carcinoma of right breast (HCC)  Breast cancer metastasized to brain, right Sara [name])  Secondary hypertension  Anxiety  Neoplasm related pain     The patient understands the plans discussed today and is in agreement with them.  She knows to contact our office if she develops concerns prior to her next appointment.  *** minutes of total time was spent for this patient encounter, including preparation,face-to-face counseling with the patient and coordination of care, physical exam, and documentation of the encounter.    Sara [name],acting as a Neurosurgeon for Sara Dry, MD.,have documented all relevant documentation on the behalf of Sara Dry, MD,as directed by  Sara Dry, MD while in the presence of Sara Dry, MD.  ***  Sara [name]  [name] Sara [name] - A DEPT OF Sara [name] [address] [place] [address] Dept: [phone] Dept Fax: [phone]   No orders of the defined types were placed in this encounter.    ONCOLOGY HISTORY:   Diagnosis: HER2 positive metastatic right breast carcinoma   -02/2021: Presentation: Palpable right breast nodule that increased in size over the following 6 months -10/01/2021: Diagnostic Right Breast Mammogram and US : Large palpable right breast mass involving the entire central right breast measuring at least 5 cm with associated nipple flattening and overlying skin thickening and generalized erythema and marked tenderness to palpation. Findings are  concerning for inflammatory breast cancer. At least 3 lymph nodes with borderline cortical thickening in the right axilla. -10/08/2021: Right breast mass and axillary lymph node biopsy.  Pathology: Invasive ductal carcinoma, poorly differentiated. Tumor cells are Her2 positive (3+), ER positive (5%), and PR negative. Ki-67 25%.  -11/30/2021: Initial PET: Extensive hypermetabolic tumor involving the right breast. There is also focal invasion of the lower pectoralis muscle. Metastatic mediastinal and hilar lymphadenopathy and hepatic metastatic disease. Diffuse lytic destructive metastatic bone disease. Cortical lesion involving the left femoral neck with definite risk for pathologic fracture. Multiple rib and spine lesions but no canal compromise is demonstrated. -12/03/2021-02/25/2022: 4 cycles of THP completed -12/24/2021: CA 15-3: 24.4. 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 There is downward descent of the tonsils and fourth ventricular obstruction causing hydrocephalus. Second 13 mm metastasis in the right parietal lobe. -11/12/2022: Cerebellar mass resection.  Pathology: Metastatic carcinoma consistent with beast primary. Tumor cells are Her2 positive (3+).  -12/03/2022: MRI Brain: Mild interval increase in size of a right parietal enhancing lesion, now measuring approximately 12 x 13 mm (previously 11 x 10 mm). Mild increase in surrounding edema. New punctate focus a of enhancement in the right caudate, concerning for new metastasis. Prior right cerebellar metastatic lesion resection without evidence of residual lesion at this site. -01/01/2023-current: [facility]  -05/02/2023: MRI Brain: New punctate focus of enhancement in the lateral high right parietal lobe, suspicious for new metastasis. Possible new punctate focus of enhancement in the or  superior right parietal  lobe, which could be a new metastasis or small vessel. -02/02/2024: MRI Brain: There are multiple new small cerebral metastases present. The previously demonstrated metastatic lesions within the right parietal lobe and caudate evident not changed significantly. -02/08/2024-02/26/2024: SRS to cerebral metastasis.    Current Treatment:  ***  INTERVAL HISTORY:   Sara [name] is here today for follow up for metastatic right breast carcinoma. Patient is accompanied by *** .     I have reviewed the past medical history, past surgical history, social history and family history with the patient and they are unchanged from previous note.  ALLERGIES:  is allergic to hydrocodone , sulfa antibiotics, and morphine  and codeine.  MEDICATIONS:  Current Outpatient Medications  Medication Sig Dispense Refill   ALPRAZolam  (XANAX ) 0.5 MG tablet Take 1 tablet (0.5 mg total) by mouth 2 (two) times daily as needed for anxiety. 60 tablet 0   aluminum -magnesium  hydroxide 200-200 MG/5ML suspension Take 15 mLs by mouth every 6 (six) hours as needed for indigestion (Mix with 15 ml of lidocaine  2% and swish and spit). 355 mL 2   amLODipine  (NORVASC ) 10 MG tablet Take 1 tablet (10 mg total) by mouth daily. 30 tablet 2   docusate sodium  (COLACE) 100 MG capsule Take 1 capsule (100 mg total) by mouth 2 (two) times daily.     furosemide  (LASIX ) 20 MG tablet Take 1 tablet (20 mg total) by mouth daily as needed. 30 tablet 0   lidocaine  (XYLOCAINE ) 2 % solution Use as directed 15 mLs in the mouth or throat every 6 (six) hours as needed for mouth pain (Mix with 15 ml of maalox and swish and spit). 100 mL 2   lidocaine -prilocaine  (EMLA ) cream Apply 1 Application topically as needed. 30 g 0   mirtazapine  (REMERON ) 15 MG tablet Take 1 tablet (15 mg total) by mouth at bedtime. 30 tablet 2   ondansetron  (ZOFRAN ) 8 MG tablet TAKE 1 TABLET BY MOUTH EVERY 8 HOURS AS NEEDED FOR NAUSEA FOR VOMITING 90 tablet 5    oxyCODONE -acetaminophen  (PERCOCET) 7.5-325 MG tablet Take 1 tablet by mouth every 6 (six) hours as needed for severe pain (pain score 7-10). 84 tablet 0   pantoprazole  (PROTONIX ) 20 MG tablet Take 1 tablet (20 mg total) by mouth daily. 30 tablet 5   prochlorperazine  (COMPAZINE ) 10 MG tablet Take 1 tablet (10 mg total) by mouth every 6 (six) hours as needed for nausea or vomiting. 30 tablet 5   No current facility-administered medications for this visit.    REVIEW OF SYSTEMS:   Constitutional: Denies fevers, chills or abnormal weight loss Eyes: Denies blurriness of vision Ears, nose, mouth, throat, and face: Denies mucositis or sore throat Respiratory: Denies cough, dyspnea or wheezes Cardiovascular: Denies palpitation, chest discomfort or lower extremity swelling Gastrointestinal:  Denies nausea, heartburn or change in bowel habits Skin: Denies abnormal skin rashes Lymphatics: Denies new lymphadenopathy or easy bruising Neurological:Denies numbness, tingling or new weaknesses Behavioral/Psych: Mood is stable, no new changes  All other systems were reviewed with the patient and are negative.   VITALS:  There were no vitals taken for this visit.  Wt Readings from Last 3 Encounters:  05/05/24 133 lb (60.3 kg)  04/05/24 128 lb (58.1 kg)  03/14/24 128 lb 15.5 oz (58.5 kg)    There is no height or weight on file to calculate BMI.  Performance status (ECOG): {CHL ONC [id number]  PHYSICAL EXAM:   GENERAL:alert, no distress and comfortable SKIN: skin color, texture, turgor are  normal, no rashes or significant lesions EYES: normal, Conjunctiva are pink and non-injected, sclera clear OROPHARYNX:no exudate, no erythema and lips, buccal mucosa, and tongue normal  NECK: supple, thyroid normal size, non-tender, without nodularity LYMPH:  no palpable lymphadenopathy in the cervical, axillary or inguinal LUNGS: clear to auscultation and percussion with normal breathing effort HEART:  regular rate & rhythm and no murmurs and no lower extremity edema ABDOMEN:abdomen soft, non-tender and normal bowel sounds Musculoskeletal:no cyanosis of digits and no clubbing  NEURO: alert & oriented x 3 with fluent speech, no focal motor/sensory deficits  LABORATORY DATA:  I have reviewed the data as listed     Component Value Date/Time   NA 142 05/05/2024 1244   K 4.0 05/05/2024 1244   CL 105 05/05/2024 1244   CO2 24 05/05/2024 1244   GLUCOSE 107 (H) 05/05/2024 1244   BUN 16 05/05/2024 1244   CREATININE 0.76 05/05/2024 1244   CALCIUM 9.3 05/05/2024 1244   PROT 7.5 05/05/2024 1244   ALBUMIN  3.8 05/05/2024 1244   AST 29 05/05/2024 1244   ALT 22 05/05/2024 1244   ALKPHOS 74 05/05/2024 1244   BILITOT 0.4 05/05/2024 1244   GFRNONAA >60 05/05/2024 1244   GFRAA >60 10/25/2019 1224     Lab Results  Component Value Date   WBC 10.2 05/05/2024   NEUTROABS 6.4 05/05/2024   HGB 12.8 05/05/2024   HCT 38.7 05/05/2024   MCV 100.5 (H) 05/05/2024   PLT 270 05/05/2024    @LASTCHEMISTRY @   RADIOGRAPHIC STUDIES: I have personally reviewed the radiological images as listed and agreed with the findings in the report. CT CHEST ABDOMEN PELVIS W CONTRAST CLINICAL DATA:  History of metastatic breast cancer, restaging. * Tracking Code: BO *  EXAM: CT CHEST, ABDOMEN, AND PELVIS WITH CONTRAST  TECHNIQUE: Multidetector CT imaging of the chest, abdomen and pelvis was performed following the standard protocol during bolus administration of intravenous contrast.  RADIATION DOSE REDUCTION: This exam was performed according to the departmental dose-optimization program which includes automated exposure control, adjustment of the mA and/or kV according to patient size and/or use of iterative reconstruction technique.  CONTRAST:  OMNIPAQUE  IOHEXOL  300 MG/ML  SOLN  COMPARISON:  Multiple priors including CT February 11, 2024  FINDINGS: CT CHEST FINDINGS  Cardiovascular: Left chest  Port-A-Cath with tip near the superior cavoatrial junction. Normal caliber thoracic aorta. Normal size heart. No significant pericardial effusion/thickening.  Mediastinum/Nodes: No suspicious thyroid nodule. No pathologically enlarged mediastinal, hilar or axillary lymph nodes. The esophagus is grossly unremarkable.  Lungs/Pleura: Similar scattered ground-glass opacities in the bilateral upper lobes. No suspicious pulmonary nodules or masses. Biapical pleural-parenchymal scarring.  Musculoskeletal: No aggressive lytic or blastic lesion of bone.  CT ABDOMEN PELVIS FINDINGS  Hepatobiliary: No suspicious hepatic lesion. Gallbladder is surgically absent. Similar prominence of the biliary tree favored reservoir effect post cholecystectomy.  Pancreas: No pancreatic ductal dilation or evidence of acute inflammation.  Spleen: No splenomegaly.  Adrenals/Urinary Tract: Bilateral adrenal glands appear normal. No hydronephrosis. Kidneys demonstrate symmetric enhancement. Urinary bladder is minimally distended limiting evaluation.  Stomach/Bowel: Stomach is unremarkable for degree of distension. No pathologic dilation of small or large bowel. 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  Vascular/Lymphatic: No significant vascular findings are present. No enlarged abdominal or pelvic lymph nodes.  Reproductive: Uterus and bilateral adnexa are unremarkable.  Other: No significant abdominopelvic free fluid.  Musculoskeletal: No aggressive lytic or blastic lesion  of bone.  IMPRESSION: 1. No evidence of metastatic disease within the chest, abdomen, or pelvis. 2. Similar scattered ground-glass opacities in the bilateral upper lobes, likely infectious or inflammatory. 3. 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  Electronically Signed   By: Reyes Holder  M.D.   On: 05/07/2024 09:54

## 2024-05-27 ENCOUNTER — Encounter: Payer: Self-pay | Admitting: Oncology

## 2024-05-27 ENCOUNTER — Telehealth: Payer: Self-pay | Admitting: Radiation Therapy

## 2024-05-27 NOTE — Telephone Encounter (Signed)
 Left voicemail for patient requesting a return call to get the October brain MRI and follow-up with Ashlyn rescheduled.   Pt no showed scan.   Devere Perch R.T(R)(T) Radiation Special Procedures Lead

## 2024-05-31 ENCOUNTER — Encounter: Payer: Self-pay | Admitting: Oncology

## 2024-06-01 ENCOUNTER — Ambulatory Visit: Payer: MEDICAID | Admitting: Urology

## 2024-06-07 ENCOUNTER — Inpatient Hospital Stay (HOSPITAL_BASED_OUTPATIENT_CLINIC_OR_DEPARTMENT_OTHER): Payer: MEDICAID | Admitting: Oncology

## 2024-06-07 ENCOUNTER — Other Ambulatory Visit: Payer: Self-pay | Admitting: *Deleted

## 2024-06-07 ENCOUNTER — Inpatient Hospital Stay: Payer: MEDICAID

## 2024-06-07 DIAGNOSIS — C50911 Malignant neoplasm of unspecified site of right female breast: Secondary | ICD-10-CM

## 2024-06-07 DIAGNOSIS — R748 Abnormal levels of other serum enzymes: Secondary | ICD-10-CM

## 2024-06-07 DIAGNOSIS — Z170 Estrogen receptor positive status [ER+]: Secondary | ICD-10-CM | POA: Diagnosis not present

## 2024-06-07 DIAGNOSIS — C7931 Secondary malignant neoplasm of brain: Secondary | ICD-10-CM | POA: Diagnosis not present

## 2024-06-07 DIAGNOSIS — R7989 Other specified abnormal findings of blood chemistry: Secondary | ICD-10-CM | POA: Diagnosis not present

## 2024-06-07 DIAGNOSIS — Z1731 Human epidermal growth factor receptor 2 positive status: Secondary | ICD-10-CM

## 2024-06-07 DIAGNOSIS — Z5112 Encounter for antineoplastic immunotherapy: Secondary | ICD-10-CM | POA: Diagnosis present

## 2024-06-07 DIAGNOSIS — F419 Anxiety disorder, unspecified: Secondary | ICD-10-CM

## 2024-06-07 DIAGNOSIS — G893 Neoplasm related pain (acute) (chronic): Secondary | ICD-10-CM

## 2024-06-07 DIAGNOSIS — I159 Secondary hypertension, unspecified: Secondary | ICD-10-CM

## 2024-06-07 DIAGNOSIS — C787 Secondary malignant neoplasm of liver and intrahepatic bile duct: Secondary | ICD-10-CM | POA: Diagnosis not present

## 2024-06-07 DIAGNOSIS — C7951 Secondary malignant neoplasm of bone: Secondary | ICD-10-CM | POA: Diagnosis not present

## 2024-06-07 DIAGNOSIS — C50111 Malignant neoplasm of central portion of right female breast: Secondary | ICD-10-CM | POA: Diagnosis not present

## 2024-06-07 LAB — CBC WITH DIFFERENTIAL/PLATELET
Abs Immature Granulocytes: 0.04 K/uL (ref 0.00–0.07)
Basophils Absolute: 0.1 K/uL (ref 0.0–0.1)
Basophils Relative: 1 %
Eosinophils Absolute: 0.1 K/uL (ref 0.0–0.5)
Eosinophils Relative: 1 %
HCT: 39 % (ref 36.0–46.0)
Hemoglobin: 13 g/dL (ref 12.0–15.0)
Immature Granulocytes: 0 %
Lymphocytes Relative: 13 %
Lymphs Abs: 1.2 K/uL (ref 0.7–4.0)
MCH: 33.6 pg (ref 26.0–34.0)
MCHC: 33.3 g/dL (ref 30.0–36.0)
MCV: 100.8 fL — ABNORMAL HIGH (ref 80.0–100.0)
Monocytes Absolute: 0.7 K/uL (ref 0.1–1.0)
Monocytes Relative: 7 %
Neutro Abs: 7.5 K/uL (ref 1.7–7.7)
Neutrophils Relative %: 78 %
Platelets: 233 K/uL (ref 150–400)
RBC: 3.87 MIL/uL (ref 3.87–5.11)
RDW: 13.1 % (ref 11.5–15.5)
WBC: 9.6 K/uL (ref 4.0–10.5)
nRBC: 0 % (ref 0.0–0.2)

## 2024-06-07 LAB — COMPREHENSIVE METABOLIC PANEL WITH GFR
ALT: 235 U/L — ABNORMAL HIGH (ref 0–44)
AST: 127 U/L — ABNORMAL HIGH (ref 15–41)
Albumin: 4.6 g/dL (ref 3.5–5.0)
Alkaline Phosphatase: 227 U/L — ABNORMAL HIGH (ref 38–126)
Anion gap: 12 (ref 5–15)
BUN: 21 mg/dL — ABNORMAL HIGH (ref 6–20)
CO2: 26 mmol/L (ref 22–32)
Calcium: 9.6 mg/dL (ref 8.9–10.3)
Chloride: 104 mmol/L (ref 98–111)
Creatinine, Ser: 0.74 mg/dL (ref 0.44–1.00)
GFR, Estimated: >60 mL/min (ref >60)
Glucose, Bld: 117 mg/dL — ABNORMAL HIGH (ref 70–99)
Potassium: 4.2 mmol/L (ref 3.5–5.1)
Sodium: 141 mmol/L (ref 135–145)
Total Bilirubin: <0.2 mg/dL (ref 0.0–1.2)
Total Protein: 7.4 g/dL (ref 6.5–8.1)

## 2024-06-07 LAB — RAPID URINE DRUG SCREEN, HOSP PERFORMED
Amphetamines: NEGATIVE
Barbiturates: NEGATIVE
Benzodiazepines: POSITIVE — AB
Cocaine: NEGATIVE
Opiates: NEGATIVE
Tetrahydrocannabinol: POSITIVE — AB

## 2024-06-07 LAB — MAGNESIUM: Magnesium: 1.9 mg/dL (ref 1.7–2.4)

## 2024-06-07 MED ORDER — ALPRAZOLAM 0.5 MG PO TABS: 0.5000 mg | ORAL_TABLET | Freq: Two times a day (BID) | ORAL | 0 refills | Status: DC | PRN

## 2024-06-07 NOTE — Progress Notes (Signed)
 Patient Care Team: Orpha Yancey LABOR, MD as PCP - General (Internal Medicine) Celestia Joesph SQUIBB, RN as Oncology Nurse Navigator (Oncology)  Clinic Day:  06/07/2024  Referring physician: Orpha Yancey LABOR, MD   CHIEF COMPLAINT:  CC: HER2 positive metastatic breast carcinoma    ASSESSMENT & PLAN:   Assessment & Plan: Sara [name]  is a 48 y.o. 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  Rest of the metastatic areas in liver and bone had significant treatment response.   -Continue CT scan every 3 months.  Next to 07/2024 -Continue current treatment with Enhertu .  Tolerating well.  Cycle 19-day 1 today.   -Echo from 01/2024 normal.  Repeat in 6 months. - Recommended dental clearance to start bone modifying agents - Labs reviewed today: Creatinine: Normal, alkaline phosphatase elevated at 227, AST: 127, ALT: 235, CBC: WNL. - Will hold Enhertu  at this time due to elevated liver enzymes.  Not a very common side effect of Enhertu  but has been reported in 1 to 2% in studies. -Patient missed her last MRI appointment.  Recommended patient to reschedule that.   Return to clinic in 1 week to recheck liver enzymes.  If still elevated, continue to hold treatment and recheck weekly.  If liver enzymes trending down, will restart Enhertu .  Elevated liver enzymes Cause unclear, not related to current prescribed medications.  Unsure about over-the-counter medications/drug use. Patient previously had an episode of elevated liver enzymes that etiology was unknown and resolved by itself.  -Will order ultrasound of the  abdomen to assess liver. - Will recheck liver enzymes in one week. - Will order hepatitis panel with next blood draw. - Hold all multivitamins and supplements. - Avoid acetaminophen  and switch pain medication to oxycodone . 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  Had multiple brain lesions at that time. Stable disease since then to 01/2024 Recent MRI with multiple new brain lesions s/p SRS   - Continue to follow-up with Dr. Patrcia with radiation oncology - Repeat MRI brain scheduled for October 2025 which she missed.  Recommended patient to reschedule. -Will continue to obtain frequent MRIs every 3 to 6 months  Chronic pain Currently on Percocet 7.5 mg every 6 hours as needed.  Management complicated by elevated liver enzymes.  -Will discontinue Percocet and prescribe oxycodone  10 mg to be taken every 6 hours as needed.  Nausea and vomiting -Continue Zofran  8 mg as needed every 8 hours.  Constipation Likely related to chemotherapy and use of anti-diarrheal medications.  Hypertension Blood pressure well-controlled with amlodipine . - Continue amlodipine  5 mg daily.  Anxiety Taking Xanax  0.5 mg daily as needed  - Continue Xanax  0.5 mg twice daily as needed    The patient understands the plans discussed today and is in agreement with them.  She knows to contact our office if she develops concerns prior to her next appointment.  The total time spent in the appointment was 40 minutes for the encounter  with patient, including review of chart and various tests results, discussions about plan  of care and coordination of care plan   I,Sara [name],acting as a scribe for Mickiel Dry, MD.,have documented all relevant documentation on the behalf of Mickiel Dry, MD,as directed by  Mickiel Dry, MD while in the presence of Mickiel Dry, MD.  I,  Mickiel Dry MD, have reviewed the above documentation for accuracy and completeness, and I agree with the above.    Sara [name]  [facility] [place] - A DEPT OF Sara [name] [address] [place] [address] Dept: [phone] Dept Fax: [phone]   Orders Placed This Encounter  Procedures   US  Abdomen Limited    Standing Status:   Future    Expiration Date:   06/07/2025    Reason for Exam (SYMPTOM  OR DIAGNOSIS REQUIRED):   elevated LFTs    Preferred imaging location?:   [facility]   Urine rapid drug screen (hosp performed)     ONCOLOGY HISTORY:   Diagnosis: HER2 positive metastatic breast carcinoma   -Presentation: Palpable knot in right breast in July 2022 which noticeably increased in size by November 2022 -10/01/2021: Bilateral Diagnostic mammogram and US : Large palpable right breast mass involving the entire central right breast measuring at least 5 cm with associated nipple flattening and overlying skin thickening and generalized erythema and marked tenderness to palpation. Findings are concerning for inflammatory breast cancer. At least 3 lymph nodes with borderline cortical thickening in the right axilla. -10/08/2021: Right breast mass and axillary lymph node biopsy.  Pathology: Poorly differentiated ductal carcinoma. The tumor cells are Her2 (3+) positive, ER positive (5%), and PR negative.  Ki-67 is 25%. -11/30/2021: Initial PET: Extensive hypermetabolic tumor involving the right breast. There is also focal invasion of the lower pectoralis muscle. Metastatic mediastinal and hilar lymphadenopathy and hepatic metastatic disease. Diffuse lytic destructive metastatic bone disease. Cortical lesion involving the left femoral neck with definite risk for pathologic fracture. Multiple rib and spine lesions but no canal compromise is demonstrated. -12/03/2021-02/25/2022: 4 cycles of THP -11/10/2022: CT CAP:  Significantly improved disease in right breast, lymphadenopathy, lytic bone lesions. -12/24/2021: CA 15-3: 24.4. CA 27.29: 24.5 -11/11/2022: MRI Brain: 3.8 cm right cerebellar metastasis with vasogenic edema and prominent mass effect in the posterior fossa. There is downward descent of the tonsils and fourth ventricular obstruction causing hydrocephalus. Second 13 mm metastasis in the right parietal lobe. -11/12/2022: Cerebellar mass resection.  Pathology: Metastatic carcinoma consistent with breast primary. The tumor cells are Positive for Her2 (3+).  -12/03/2022: MRI Brain: Mild interval increase in size of a right parietal enhancing lesion, now measuring approximately 12 x 13 mm (previously 11 x 10 mm). Mild increase in surrounding edema. New punctate focus a of enhancement in the right caudate, concerning for new metastasis. Prior right cerebellar metastatic lesion resection without evidence of residual lesion at this site.  -01/01/2023-current: Enhertu  5.4mg /kg every 3 weeks -05/13/2023: Stereotactic radiosurgery (Brain SRS) -2024-current: Disease remains stable in chest, abdomen, and pelvis per imaging.  -09/14/2023: MRI Brain: Stable appearance of brain metastasis.  -02/02/2024: MRI Brain: There are multiple new small cerebral metastases present. Otherwise stable brain metastasis.    Current Treatment:  Enhertu  5.4mg /kg every 3 weeks  INTERVAL HISTORY:   Discussed the use of AI scribe software for clinical note transcription with the patient, who gave verbal consent to proceed.  History of Present Illness Sara [name] is a 48 year old female with breast cancer and brain metastasis who presents  for follow-up and her next cycle of Enhertu .  The patient reports that she has not missed any treatments recently, with the last treatment in September.  She experiences nausea and vomiting, noting a yellow substance in the toilet in the morning, which she is unsure if it was bile. She also  has abdominal pain, which she attributes to bowel movements. She takes anti-diarrhea pills and Colace to manage her bowel movements, which she describes as being 'stopped up'.  Her current medications include amlodipine  5 mg for blood pressure, Percocet 7.5 mg for pain, Remeron  at night, an acid reflux pill, and a nausea and vomiting pill. She also takes multivitamins, zinc , vitamin C, curcumin 1000 mg daily, and vegetable and fruit pills. She mentions taking Tylenol  BC and Aleve  BC for arthritis, and aspirin with caffeine , but denies regular alcohol consumption.  She reports her father also took Norvasc  for blood pressure, which she finds makes her feel off balance. She has adjusted her dose by cutting the 10 mg pill in half and then in half again, effectively taking 5 mg.  She has not yet rescheduled her MRI for brain metastasis follow-up. She mentions a dental clearance scheduled for the second or third week of November.   I have reviewed the past medical history, past surgical history, social history and family history with the patient and they are unchanged from previous note.  ALLERGIES:  is allergic to hydrocodone , sulfa antibiotics, and morphine  and codeine.  MEDICATIONS:  Current Outpatient Medications  Medication Sig Dispense Refill   aluminum -magnesium  hydroxide 200-200 MG/5ML suspension Take 15 mLs by mouth every 6 (six) hours as needed for indigestion (Mix with 15 ml of lidocaine  2% and swish and spit). 355 mL 2   amLODipine  (NORVASC ) 10 MG tablet Take 1 tablet (10 mg total) by mouth daily. 30 tablet 2   docusate sodium  (COLACE) 100 MG capsule Take 1 capsule (100 mg total) by mouth 2 (two) times daily.     furosemide  (LASIX ) 20 MG tablet Take 1 tablet (20 mg total) by mouth daily as needed. 30 tablet 0   lidocaine  (XYLOCAINE ) 2 % solution Use as directed 15 mLs in the mouth or throat every 6 (six) hours as needed for mouth pain (Mix with 15 ml of maalox and swish and spit). 100 mL 2    lidocaine -prilocaine  (EMLA ) cream Apply 1 Application topically as needed. 30 g 0   mirtazapine  (REMERON ) 15 MG tablet Take 1 tablet (15 mg total) by mouth at bedtime. 30 tablet 2   ondansetron  (ZOFRAN ) 8 MG tablet TAKE 1 TABLET BY MOUTH EVERY 8 HOURS AS NEEDED FOR NAUSEA FOR VOMITING 90 tablet 5   oxyCODONE -acetaminophen  (PERCOCET) 7.5-325 MG tablet Take 1 tablet by mouth every 6 (six) hours as needed for severe pain (pain score 7-10). 84 tablet 0   pantoprazole  (PROTONIX ) 20 MG tablet Take 1 tablet (20 mg total) by mouth daily. 30 tablet 5   prochlorperazine  (COMPAZINE ) 10 MG tablet Take 1 tablet (10 mg total) by mouth every 6 (six) hours as needed for nausea or vomiting. 30 tablet 5   ALPRAZolam  (XANAX ) 0.5 MG tablet Take 1 tablet (0.5 mg total) by mouth 2 (two) times daily as needed for anxiety. 60 tablet 0   No current facility-administered medications for this visit.    REVIEW OF SYSTEMS:   Constitutional: Denies fevers, chills or abnormal weight loss Eyes: Denies blurriness of vision Ears, nose, mouth, throat, and face: Denies mucositis or sore throat Respiratory: Denies cough,  dyspnea or wheezes Cardiovascular: Denies palpitation, chest discomfort or lower extremity swelling Gastrointestinal:  Denies nausea, heartburn or change in bowel habits Skin: Denies abnormal skin rashes Lymphatics: Denies new lymphadenopathy or easy bruising Neurological:Denies numbness, tingling or new weaknesses Behavioral/Psych: Mood is stable, no new changes  All other systems were reviewed with the patient and are negative.   VITALS:  There were no vitals taken for this visit.  Wt Readings from Last 3 Encounters:  06/07/24 134 lb 4.2 oz (60.9 kg)  05/05/24 133 lb (60.3 kg)  04/05/24 128 lb (58.1 kg)    There is no height or weight on file to calculate BMI.  Performance status (ECOG): 0 - Asymptomatic  PHYSICAL EXAM:   GENERAL:alert, no distress and comfortable SKIN: skin color, texture,  turgor are normal, no rashes or significant lesions LYMPH:  no palpable lymphadenopathy in the cervical, axillary or inguinal LUNGS: clear to auscultation and percussion with normal breathing effort HEART: regular rate & rhythm and no murmurs and no lower extremity edema ABDOMEN:abdomen soft, non-tender and normal bowel sounds Musculoskeletal:no cyanosis of digits and no clubbing  NEURO: alert & oriented x 3 with fluent speech  LABORATORY DATA:  I have reviewed the data as listed     Component Value Date/Time   NA 141 06/07/2024 1341   K 4.2 06/07/2024 1341   CL 104 06/07/2024 1341   CO2 26 06/07/2024 1341   GLUCOSE 117 (H) 06/07/2024 1341   BUN 21 (H) 06/07/2024 1341   CREATININE 0.74 06/07/2024 1341   CALCIUM 9.6 06/07/2024 1341   PROT 7.4 06/07/2024 1341   ALBUMIN  4.6 06/07/2024 1341   AST 127 (H) 06/07/2024 1341   ALT 235 (H) 06/07/2024 1341   ALKPHOS 227 (H) 06/07/2024 1341   BILITOT <0.2 06/07/2024 1341   GFRNONAA >60 06/07/2024 1341   GFRAA >60 10/25/2019 1224     Lab Results  Component Value Date   WBC 9.6 06/07/2024   NEUTROABS 7.5 06/07/2024   HGB 13.0 06/07/2024   HCT 39.0 06/07/2024   MCV 100.8 (H) 06/07/2024   PLT 233 06/07/2024    Latest Reference Range & Units 04/05/24 11:15  CA 15-3 0.0 - 25.0 U/mL 14.2  CA 27.29 0.0 - 38.6 U/mL 18.5    RADIOGRAPHIC STUDIES: I have personally reviewed the radiological images as listed and agreed with the findings in the report.  CT CHEST ABDOMEN PELVIS W CONTRAST CLINICAL DATA:  History of metastatic breast cancer, restaging. * Tracking Code: BO *  EXAM: CT CHEST, ABDOMEN, AND PELVIS WITH CONTRAST  TECHNIQUE: Multidetector CT imaging of the chest, abdomen and pelvis was performed following the standard protocol during bolus administration of intravenous contrast.  RADIATION DOSE REDUCTION: This exam was performed according to the departmental dose-optimization program which includes automated exposure  control, adjustment of the mA and/or kV according to patient size and/or use of iterative reconstruction technique.  CONTRAST:  OMNIPAQUE  IOHEXOL  300 MG/ML  SOLN  COMPARISON:  Multiple priors including CT February 11, 2024  FINDINGS: CT CHEST FINDINGS  Cardiovascular: Left chest Port-A-Cath with tip near the superior cavoatrial junction. Normal caliber thoracic aorta. Normal size heart. No significant pericardial effusion/thickening.  Mediastinum/Nodes: No suspicious thyroid nodule. No pathologically enlarged mediastinal, hilar or axillary lymph nodes. The esophagus is grossly unremarkable.  Lungs/Pleura: Similar scattered ground-glass opacities in the bilateral upper lobes. No suspicious pulmonary nodules or masses. Biapical pleural-parenchymal scarring.  Musculoskeletal: No aggressive lytic or blastic lesion of bone.  CT ABDOMEN PELVIS FINDINGS  Hepatobiliary: No suspicious hepatic lesion. Gallbladder is surgically absent. Similar prominence of the biliary tree favored reservoir effect post cholecystectomy.  Pancreas: No pancreatic ductal dilation or evidence of acute inflammation.  Spleen: No splenomegaly.  Adrenals/Urinary Tract: Bilateral adrenal glands appear normal. No hydronephrosis. Kidneys demonstrate symmetric enhancement. Urinary bladder is minimally distended limiting evaluation.  Stomach/Bowel: Stomach is unremarkable for degree of distension. No pathologic dilation of small or large bowel. 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  Vascular/Lymphatic: No significant vascular findings are present. No enlarged abdominal or pelvic lymph nodes.  Reproductive: Uterus and bilateral adnexa are unremarkable.  Other: No significant abdominopelvic free fluid.  Musculoskeletal: No aggressive lytic or blastic lesion of bone.  IMPRESSION: 1. No evidence of metastatic disease within the chest,  abdomen, or pelvis. 2. Similar scattered ground-glass opacities in the bilateral upper lobes, likely infectious or inflammatory. 3. 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  Electronically Signed   By: Reyes Holder M.D.   On: 05/07/2024 09:54

## 2024-06-07 NOTE — Progress Notes (Signed)
 Port flushed with good blood return noted. No bruising or swelling at site. Bandaid applied and patient discharged in satisfactory condition. VVS stable with no signs or symptoms of distressed noted.

## 2024-06-07 NOTE — Patient Instructions (Signed)
 [facility] [place] - A DEPT OF [facility]  Discharge Instructions: Thank you for choosing [facility] to provide your oncology and hematology care.  If you have a lab appointment with the Cancer Center - please note that after April 8th, 2024, all labs will be drawn in the cancer center.  You do not have to check in or register with the main entrance as you have in the past but will complete your check-in in the cancer center.  Wear comfortable clothing and clothing appropriate for easy access to any Portacath or PICC line.   We strive to give you quality time with your provider. You may need to reschedule your appointment if you arrive late (15 or more minutes).  Arriving late affects you and other patients whose appointments are after yours.  Also, if you miss three or more appointments without notifying the office, you may be dismissed from the clinic at the provider's discretion.      For prescription refill requests, have your pharmacy contact our office and allow 72 hours for refills to be completed.    Today you received the following port accessed with blood draw, return as scheduled.   To help prevent nausea and vomiting after your treatment, we encourage you to take your nausea medication as directed.  BELOW ARE SYMPTOMS THAT SHOULD BE REPORTED IMMEDIATELY: *FEVER GREATER THAN 100.4 F (38 C) OR HIGHER *CHILLS OR SWEATING *NAUSEA AND VOMITING THAT IS NOT CONTROLLED WITH YOUR NAUSEA MEDICATION *UNUSUAL SHORTNESS OF BREATH *UNUSUAL BRUISING OR BLEEDING *URINARY PROBLEMS (pain or burning when urinating, or frequent urination) *BOWEL PROBLEMS (unusual diarrhea, constipation, pain near the anus) TENDERNESS IN MOUTH AND THROAT WITH OR WITHOUT PRESENCE OF ULCERS (sore throat, sores in mouth, or a toothache) UNUSUAL RASH, SWELLING OR PAIN  UNUSUAL VAGINAL DISCHARGE OR ITCHING   Items with * indicate a potential emergency and should be followed up  as soon as possible or go to the Emergency Department if any problems should occur.  Please show the CHEMOTHERAPY ALERT CARD or IMMUNOTHERAPY ALERT CARD at check-in to the Emergency Department and triage nurse.  Should you have questions after your visit or need to cancel or reschedule your appointment, please contact [facility] [place] - A DEPT OF [facility] [phone]  and follow the prompts.  Office hours are 8:00 a.m. to 4:30 p.m. Monday - Friday. Please note that voicemails left after 4:00 p.m. may not be returned until the following business day.  We are closed weekends and major holidays. You have access to a nurse at all times for urgent questions. Please call the main number to the clinic [phone] and follow the prompts.  For any non-urgent questions, you may also contact your provider using MyChart. We now offer e-Visits for anyone 29 and older to request care online for non-urgent symptoms. For details visit [email or web address].   Also download the MyChart app! Go to the app store, search MyChart, open the app, select [facility], and log in with your MyChart username and password.

## 2024-06-07 NOTE — Patient Instructions (Addendum)
 [facility] at [facility] Discharge Instructions   You were seen and examined today by Dr. Davonna.  She reviewed the results of your lab work which are mostly normal/stable. Your liver enzymes are elevated too much to give treatment.   We will hold your treatment tomorrow.   Return as scheduled.    Thank you for choosing [facility] at [facility] to provide your oncology and hematology care.  To afford each patient quality time with our provider, please arrive at least 15 minutes before your scheduled appointment time.   If you have a lab appointment with the Cancer Center please come in thru the Main Entrance and check in at the main information desk.  You need to re-schedule your appointment should you arrive 10 or more minutes late.  We strive to give you quality time with our providers, and arriving late affects you and other patients whose appointments are after yours.  Also, if you no show three or more times for appointments you may be dismissed from the clinic at the providers discretion.     Again, thank you for choosing [facility].  Our hope is that these requests will decrease the amount of time that you wait before being seen by our physicians.       _____________________________________________________________  Should you have questions after your visit to [facility], please contact our office at [phone] and follow the prompts.  Our office hours are 8:00 a.m. and 4:30 p.m. Monday - Friday.  Please note that voicemails left after 4:00 p.m. may not be returned until the following business day.  We are closed weekends and major holidays.  You do have access to a nurse 24-7, just call the main number to the clinic [phone] and do not press any options, hold on the line and a nurse will answer the phone.    For prescription refill requests, have your pharmacy contact our office and allow 72 hours.     Due to Covid, you will need to wear a mask upon entering the hospital. If you do not have a mask, a mask will be given to you at the Main Entrance upon arrival. For doctor visits, patients may have 1 support person age 68 or older with them. For treatment visits, patients can not have anyone with them due to social distancing guidelines and our immunocompromised population.

## 2024-06-08 ENCOUNTER — Other Ambulatory Visit: Payer: Self-pay | Admitting: *Deleted

## 2024-06-08 ENCOUNTER — Other Ambulatory Visit: Payer: Self-pay

## 2024-06-08 ENCOUNTER — Inpatient Hospital Stay: Payer: MEDICAID

## 2024-06-08 MED ORDER — OXYCODONE HCL 5 MG PO TABS: 10.0000 mg | ORAL_TABLET | Freq: Four times a day (QID) | ORAL | 0 refills | Status: DC | PRN

## 2024-06-08 MED ORDER — MIRTAZAPINE 15 MG PO TABS: 15.0000 mg | ORAL_TABLET | Freq: Every day | ORAL | 2 refills | Status: DC

## 2024-06-09 ENCOUNTER — Ambulatory Visit (HOSPITAL_COMMUNITY)
Admission: RE | Admit: 2024-06-09 | Discharge: 2024-06-09 | Disposition: A | Discharge status: Home / Self Care | Payer: MEDICAID | Source: Ambulatory Visit | Attending: Radiation Oncology | Admitting: Radiation Oncology

## 2024-06-09 ENCOUNTER — Encounter: Payer: Self-pay | Admitting: Oncology

## 2024-06-09 DIAGNOSIS — C7931 Secondary malignant neoplasm of brain: Secondary | ICD-10-CM | POA: Diagnosis present

## 2024-06-09 MED ORDER — GADOBUTROL 1 MMOL/ML IV SOLN
6.1000 mL | Freq: Once | INTRAVENOUS | Status: AC | PRN
  Administered 2024-06-09: 6.1 mL via INTRAVENOUS

## 2024-06-09 NOTE — Progress Notes (Incomplete)
 Follow up call to receive results from MRI brain on 06/09/24:    IMPRESSION: 1. 10 brain metastases which are either new or in retrospect larger from 02/02/2024. 2. Slight interval enlargement of clustered nodular enhancing lesions in the right occipital lobe. 3. Other small brain metastases are unchanged.

## 2024-06-10 ENCOUNTER — Ambulatory Visit (HOSPITAL_COMMUNITY)
Admission: RE | Admit: 2024-06-10 | Discharge: 2024-06-10 | Disposition: A | Discharge status: Home / Self Care | Payer: MEDICAID | Source: Ambulatory Visit | Attending: Oncology | Admitting: Oncology

## 2024-06-10 ENCOUNTER — Other Ambulatory Visit: Payer: Self-pay | Admitting: Radiation Therapy

## 2024-06-10 DIAGNOSIS — R7989 Other specified abnormal findings of blood chemistry: Secondary | ICD-10-CM | POA: Insufficient documentation

## 2024-06-10 NOTE — Addendum Note (Signed)
 Encounter addended by: Sherwood Rise, PA-C on: 06/10/2024 9:54 AM  Actions taken: Clinical Note Signed

## 2024-06-13 ENCOUNTER — Inpatient Hospital Stay: Payer: MEDICAID

## 2024-06-14 ENCOUNTER — Inpatient Hospital Stay: Payer: MEDICAID

## 2024-06-15 ENCOUNTER — Ambulatory Visit
Admission: RE | Admit: 2024-06-15 | Discharge: 2024-06-15 | Disposition: A | Discharge status: Home / Self Care | Payer: MEDICAID | Source: Ambulatory Visit | Attending: Urology | Admitting: Urology

## 2024-06-15 ENCOUNTER — Inpatient Hospital Stay: Payer: MEDICAID

## 2024-06-15 ENCOUNTER — Encounter: Payer: Self-pay | Admitting: Urology

## 2024-06-15 VITALS — BP 134/88 | HR 87 | Temp 98.3°F | Resp 18

## 2024-06-15 DIAGNOSIS — Z5112 Encounter for antineoplastic immunotherapy: Secondary | ICD-10-CM | POA: Diagnosis not present

## 2024-06-15 DIAGNOSIS — C50911 Malignant neoplasm of unspecified site of right female breast: Secondary | ICD-10-CM

## 2024-06-15 DIAGNOSIS — Z1731 Human epidermal growth factor receptor 2 positive status: Secondary | ICD-10-CM

## 2024-06-15 LAB — COMPREHENSIVE METABOLIC PANEL WITH GFR
ALT: 37 U/L (ref 0–44)
AST: 28 U/L (ref 15–41)
Albumin: 4.3 g/dL (ref 3.5–5.0)
Alkaline Phosphatase: 113 U/L (ref 38–126)
Anion gap: 11 (ref 5–15)
BUN: 18 mg/dL (ref 6–20)
CO2: 26 mmol/L (ref 22–32)
Calcium: 9.3 mg/dL (ref 8.9–10.3)
Chloride: 101 mmol/L (ref 98–111)
Creatinine, Ser: 0.82 mg/dL (ref 0.44–1.00)
GFR, Estimated: >60 mL/min (ref >60)
Glucose, Bld: 122 mg/dL — ABNORMAL HIGH (ref 70–99)
Potassium: 3.9 mmol/L (ref 3.5–5.1)
Sodium: 139 mmol/L (ref 135–145)
Total Bilirubin: 0.2 mg/dL (ref 0.0–1.2)
Total Protein: 7 g/dL (ref 6.5–8.1)

## 2024-06-15 LAB — CBC WITH DIFFERENTIAL/PLATELET
Abs Immature Granulocytes: 0.01 K/uL (ref 0.00–0.07)
Basophils Absolute: 0.1 K/uL (ref 0.0–0.1)
Basophils Relative: 1 %
Eosinophils Absolute: 0.3 K/uL (ref 0.0–0.5)
Eosinophils Relative: 4 %
HCT: 37.4 % (ref 36.0–46.0)
Hemoglobin: 12.3 g/dL (ref 12.0–15.0)
Immature Granulocytes: 0 %
Lymphocytes Relative: 21 %
Lymphs Abs: 1.3 K/uL (ref 0.7–4.0)
MCH: 33.2 pg (ref 26.0–34.0)
MCHC: 32.9 g/dL (ref 30.0–36.0)
MCV: 100.8 fL — ABNORMAL HIGH (ref 80.0–100.0)
Monocytes Absolute: 0.6 K/uL (ref 0.1–1.0)
Monocytes Relative: 10 %
Neutro Abs: 4.1 K/uL (ref 1.7–7.7)
Neutrophils Relative %: 64 %
Platelets: 193 K/uL (ref 150–400)
RBC: 3.71 MIL/uL — ABNORMAL LOW (ref 3.87–5.11)
RDW: 12.6 % (ref 11.5–15.5)
WBC: 6.4 K/uL (ref 4.0–10.5)
nRBC: 0 % (ref 0.0–0.2)

## 2024-06-15 LAB — MAGNESIUM: Magnesium: 2.1 mg/dL (ref 1.7–2.4)

## 2024-06-15 MED ORDER — DEXTROSE 5 % IV SOLN: Freq: Once | INTRAVENOUS | Status: AC

## 2024-06-15 MED ORDER — SODIUM CHLORIDE 0.9 % IV SOLN
150.0000 mg | Freq: Once | INTRAVENOUS | Status: AC
  Administered 2024-06-15: 150 mg via INTRAVENOUS
  Filled 2024-06-15: qty 150

## 2024-06-15 MED ORDER — FAM-TRASTUZUMAB DERUXTECAN-NXKI CHEMO 100 MG IV SOLR
5.4000 mg/kg | Freq: Once | INTRAVENOUS | Status: AC
  Administered 2024-06-15: 300 mg via INTRAVENOUS
  Filled 2024-06-15: qty 15

## 2024-06-15 MED ORDER — FAMOTIDINE IN NACL 20-0.9 MG/50ML-% IV SOLN
20.0000 mg | Freq: Once | INTRAVENOUS | Status: AC
  Administered 2024-06-15: 20 mg via INTRAVENOUS
  Filled 2024-06-15: qty 50

## 2024-06-15 MED ORDER — PALONOSETRON HCL INJECTION 0.25 MG/5ML
0.2500 mg | Freq: Once | INTRAVENOUS | Status: AC
  Administered 2024-06-15: 0.25 mg via INTRAVENOUS
  Filled 2024-06-15: qty 5

## 2024-06-15 MED ORDER — DEXAMETHASONE SODIUM PHOSPHATE 10 MG/ML IJ SOLN
10.0000 mg | Freq: Once | INTRAMUSCULAR | Status: AC
  Administered 2024-06-15: 10 mg via INTRAVENOUS

## 2024-06-15 NOTE — Progress Notes (Signed)
 Maintain dose at 300 mg despite slight weight increase.  Monitoring LFT's - DC Tylenol  per PCP  V.O. Dr Ivery Molt, PharmD

## 2024-06-15 NOTE — Progress Notes (Signed)
 Patient presents today for chemotherapy Enhertu   infusion.  Patient is in satisfactory condition with no new complaints voiced.  Vital signs are stable.  Labs reviewed and all labs are within treatment parameters.  We will proceed with treatment per MD orders.    Treatment given today per MD orders. Tolerated infusion without adverse affects. Vital signs stable. No complaints at this time. Discharged from clinic ambulatory in stable condition. Alert and oriented x 3. F/U with [facility] as scheduled.

## 2024-06-15 NOTE — Patient Instructions (Signed)
 [facility] [place] - A DEPT OF [facility]  Discharge Instructions: Thank you for choosing [facility] to provide your oncology and hematology care.  If you have a lab appointment with the Cancer Center - please note that after April 8th, 2024, all labs will be drawn in the cancer center.  You do not have to check in or register with the main entrance as you have in the past but will complete your check-in in the cancer center.  Wear comfortable clothing and clothing appropriate for easy access to any Portacath or PICC line.   We strive to give you quality time with your provider. You may need to reschedule your appointment if you arrive late (15 or more minutes).  Arriving late affects you and other patients whose appointments are after yours.  Also, if you miss three or more appointments without notifying the office, you may be dismissed from the clinic at the provider's discretion.      For prescription refill requests, have your pharmacy contact our office and allow 72 hours for refills to be completed.    Today you received the following chemotherapy and/or immunotherapy agents Opdivo   To help prevent nausea and vomiting after your treatment, we encourage you to take your nausea medication as directed.  Nivolumab Injection What is this medication? NIVOLUMAB (nye VOL ue mab) treats some types of cancer. It works by helping your immune system slow or stop the spread of cancer cells. It is a monoclonal antibody. This medicine may be used for other purposes; ask your health care provider or pharmacist if you have questions. COMMON BRAND NAME(S): Opdivo What should I tell my care team before I take this medication? They need to know if you have any of these conditions: Allogeneic stem cell transplant (uses someone else's stem cells) Autoimmune diseases, such as Crohn disease, ulcerative colitis, lupus History of chest radiation Nervous system problems,  such as Guillain-Barre syndrome or myasthenia gravis Organ transplant An unusual or allergic reaction to nivolumab, other medications, foods, dyes, or preservatives Pregnant or trying to get pregnant Breast-feeding How should I use this medication? This medication is infused into a vein. It is given in a hospital or clinic setting. A special MedGuide will be given to you before each treatment. Be sure to read this information carefully each time. Talk to your care team about the use of this medication in children. While it may be prescribed for children as young as 12 years for selected conditions, precautions do apply. Overdosage: If you think you have taken too much of this medicine contact a poison control center or emergency room at once. NOTE: This medicine is only for you. Do not share this medicine with others. What if I miss a dose? Keep appointments for follow-up doses. It is important not to miss your dose. Call your care team if you are unable to keep an appointment. What may interact with this medication? Interactions have not been studied. This list may not describe all possible interactions. Give your health care provider a list of all the medicines, herbs, non-prescription drugs, or dietary supplements you use. Also tell them if you smoke, drink alcohol, or use illegal drugs. Some items may interact with your medicine. What should I watch for while using this medication? Your condition will be monitored carefully while you are receiving this medication. You may need blood work while taking this medication. This medication may cause serious skin reactions. They  can happen weeks to months after starting the medication. Contact your care team right away if you notice fevers or flu-like symptoms with a rash. The rash may be red or purple and then turn into blisters or peeling of the skin. You may also notice a red rash with swelling of the face, lips, or lymph nodes in your neck or under  your arms. Tell your care team right away if you have any change in your eyesight. Talk to your care team if you are pregnant or think you might be pregnant. A negative pregnancy test is required before starting this medication. A reliable form of contraception is recommended while taking this medication and for 5 months after the last dose. Talk to your care team about effective forms of contraception. Do not breast-feed while taking this medication and for 5 months after the last dose. What side effects may I notice from receiving this medication? Side effects that you should report to your care team as soon as possible: Allergic reactions--skin rash, itching, hives, swelling of the face, lips, tongue, or throat Dry cough, shortness of breath or trouble breathing Eye pain, redness, irritation, or discharge with blurry or decreased vision Heart muscle inflammation--unusual weakness or fatigue, shortness of breath, chest pain, fast or irregular heartbeat, dizziness, swelling of the ankles, feet, or hands Hormone gland problems--headache, sensitivity to light, unusual weakness or fatigue, dizziness, fast or irregular heartbeat, increased sensitivity to cold or heat, excessive sweating, constipation, hair loss, increased thirst or amount of urine, tremors or shaking, irritability Infusion reactions--chest pain, shortness of breath or trouble breathing, feeling faint or lightheaded Kidney injury (glomerulonephritis)--decrease in the amount of urine, red or dark brown urine, foamy or bubbly urine, swelling of the ankles, hands, or feet Liver injury--right upper belly pain, loss of appetite, nausea, light-colored stool, dark yellow or brown urine, yellowing skin or eyes, unusual weakness or fatigue Pain, tingling, or numbness in the hands or feet, muscle weakness, change in vision, confusion or trouble speaking, loss of balance or coordination, trouble walking, seizures Rash, fever, and swollen lymph  nodes Redness, blistering, peeling, or loosening of the skin, including inside the mouth Sudden or severe stomach pain, bloody diarrhea, fever, nausea, vomiting Side effects that usually do not require medical attention (report these to your care team if they continue or are bothersome): Bone, joint, or muscle pain Diarrhea Fatigue Loss of appetite Nausea Skin rash This list may not describe all possible side effects. Call your doctor for medical advice about side effects. You may report side effects to FDA at 1-800-FDA-1088. Where should I keep my medication? This medication is given in a hospital or clinic. It will not be stored at home. NOTE: This sheet is a summary. It may not cover all possible information. If you have questions about this medicine, talk to your doctor, pharmacist, or health care provider.  2024 Elsevier/Gold Standard (2021-12-02 00:00:00)   BELOW ARE SYMPTOMS THAT SHOULD BE REPORTED IMMEDIATELY: *FEVER GREATER THAN 100.4 F (38 C) OR HIGHER *CHILLS OR SWEATING *NAUSEA AND VOMITING THAT IS NOT CONTROLLED WITH YOUR NAUSEA MEDICATION *UNUSUAL SHORTNESS OF BREATH *UNUSUAL BRUISING OR BLEEDING *URINARY PROBLEMS (pain or burning when urinating, or frequent urination) *BOWEL PROBLEMS (unusual diarrhea, constipation, pain near the anus) TENDERNESS IN MOUTH AND THROAT WITH OR WITHOUT PRESENCE OF ULCERS (sore throat, sores in mouth, or a toothache) UNUSUAL RASH, SWELLING OR PAIN  UNUSUAL VAGINAL DISCHARGE OR ITCHING   Items with * indicate a potential emergency and should be  followed up as soon as possible or go to the Emergency Department if any problems should occur.  Please show the CHEMOTHERAPY ALERT CARD or IMMUNOTHERAPY ALERT CARD at check-in to the Emergency Department and triage nurse.  Should you have questions after your visit or need to cancel or reschedule your appointment, please contact [facility] [place] - A DEPT OF [facility]igh[facility]  [phone]  and follow the prompts.  Office hours are 8:00 a.m. to 4:30 p.m. Monday - Friday. Please note that voicemails left after 4:00 p.m. may not be returned until the following business day.  We are closed weekends and major holidays. You have access to a nurse at all times for urgent questions. Please call the main number to the clinic [phone] and follow the prompts.  For any non-urgent questions, you may also contact your provider using MyChart. We now offer e-Visits for anyone 27 and older to request care online for non-urgent symptoms. For details visit [email or web address].   Also download the MyChart app! Go to the app store, search "MyChart", open the app, select [facility], and log in with your MyChart username and password.

## 2024-06-16 ENCOUNTER — Ambulatory Visit: Payer: MEDICAID | Admitting: Urology

## 2024-06-16 ENCOUNTER — Inpatient Hospital Stay: Payer: MEDICAID

## 2024-06-16 ENCOUNTER — Ambulatory Visit
Admission: RE | Admit: 2024-06-16 | Discharge: 2024-06-16 | Disposition: A | Discharge status: Home / Self Care | Payer: MEDICAID | Source: Ambulatory Visit | Attending: Urology | Admitting: Urology

## 2024-06-16 DIAGNOSIS — C50911 Malignant neoplasm of unspecified site of right female breast: Secondary | ICD-10-CM

## 2024-06-16 LAB — CANCER ANTIGEN 15-3: CA 15-3: 13.7 U/mL (ref 0.0–25.0)

## 2024-06-16 LAB — CANCER ANTIGEN 27.29: CA 27.29: 20.1 U/mL (ref 0.0–38.6)

## 2024-06-16 NOTE — Progress Notes (Signed)
 Follow up call to receive results from MRI brain on 06/09/24:  Patient is having some pain in her neck and the back of her head. Very tired from her chemo infusion yesterday.     IMPRESSION: 1. 10 brain metastases which are either new or in retrospect larger from 02/02/2024. 2. Slight interval enlargement of clustered nodular enhancing lesions in the right occipital lobe. 3. Other small brain metastases are unchanged.

## 2024-06-16 NOTE — Progress Notes (Signed)
 Radiation Oncology         [phone] ________________________________  Name: SHELBEE APGAR MRN: [id number]  Date: 06/16/2024  DOB: 12/11/1975  Outpatient Reconsult Note  CC: Orpha Yancey LABOR, MD  Orpha Yancey LABOR, MD  Diagnosis:   48 yo woman with 11 new brain metastases secondary to metastatic breast cancer.   Interval Since Last Radiation:  3 months  02/18/24 - 02/26/24: The following two targets were treated to 27 Gy in 3 fractions of 9 Gy each:   05/13/23//SRS:  The following two targets were treated to 20 Gy in a single fraction of SRS:    12/05/22 - 12/19/22:  The brain received 20 Gy in a single fraction to her two intact metastases in the right parietal lobe (1.3 cm) and falx cerebri (1.2 cm) and 30 Gy in 10 fractions to the resection cavity in the right cerebellum/posterior fossa.         Narrative:     She tolerated the SRS treatments well, without any acute ill side effects.  Her post-treatment MRI brain scan on 09/14/23 showed a stable appearance of the brain as compared to 05/02/2023 with postoperative changes in the right cerebellum without evidence of residual or recurrent viable tumor and a stable appearance of the treated metastasis in the medial right parietal lobe. Additionally, there was a stable punctate focus of enhancement in the right caudate head and along the surface of the right parietal lobe but no new lesions noted.  The recommendation was to proceed with serial MRI brain scans every 3 months but the patient cancelled the scheduled scan in 11/2023 are reported that she did not want to continue with the surveillance imaging of the brain due to difficulties with transportation despite the cancer center providing transportation to and from the visits.We continued to follow up with patient to encourage surveillance scans and she did agree to proceed with a repeat MRI brain scan at [facility] on 02/02/24.  This scan showed 4 new small cerebral metastases present (all  sub-centimeter). The previously demonstrated metastatic lesions within the right parietal lobe and caudate were not changed significantly and there was stable postoperative encephalomalacia changes within the right cerebellar hemisphere. She proceeded with fractionated SRS to treat the 4 new lesions; completed 02/26/24 and tolerated well..                 She has continued on Enhertu  therapy under the care of Dr. Davonna. She started this on 01/01/2023 and continues to tolerate this well with follow up systemic imaging showing no evidence of disease progression in the chest. Unfortunately, her most recent post-treatment MRI brain scan from 06/09/24 showed significant disease progression. The encephalomalacia in the right cerebellar hemisphere is unchanged and without suspicious locally recurrent enhancement but clustered small nodular foci of enhancement in the right occipital lobe have slightly enlarged from the prior study. There are numerous additional small enhancing supratentorial and infratentorial lesions which are either new or in retrospect larger than on the prior MRI as follows: 1.  3 mm inferior right cerebellum (image 96) 2.  7 mm lateral left cerebellum (image 107) 3.  5 mm anterior right temporal lobe (image 118) 4.  5 mm anterior left temporal lobe (image 124) 5.  2 mm medial left temporal lobe (image 127) 6.  3 mm posteromedial left temporal lobe (image 128) 7.  5 mm posterosuperior left temporal lobe (image 159) 8.  3 mm anterior left frontal lobe (image 171) 9.  3  mm medial posterior left frontal lobe (image 193) 10. 4 mm high posterior right frontal lobe (image 227) There is no significant brain edema. No suspicious marrow lesion. All other enhancing cerebral lesions described on the prior MRI are either stable or smaller in size. This MRI was fused with her previous treatment plans and confirmed 11 new metastases.       We reviewed these results by telephone today.  On review of  systems, the patient states that she is doing well overall aside from significant anxiety around the time of her scans. She does have some imbalance for approximately 5 days following each of her systemic treatments but this resolves spontaneously and then recurs with the next cycle of treatment. She denies any more frequent headaches or any nausea, vomiting, visual disturbances, extremity weakness, or other concerns today.    ALLERGIES:  is allergic to hydrocodone , sulfa antibiotics, and morphine  and codeine.  Meds: Current Outpatient Medications  Medication Sig Dispense Refill   ALPRAZolam  (XANAX ) 0.5 MG tablet Take 1 tablet (0.5 mg total) by mouth 2 (two) times daily as needed for anxiety. 60 tablet 0   aluminum -magnesium  hydroxide 200-200 MG/5ML suspension Take 15 mLs by mouth every 6 (six) hours as needed for indigestion (Mix with 15 ml of lidocaine  2% and swish and spit). 355 mL 2   amLODipine  (NORVASC ) 10 MG tablet Take 1 tablet (10 mg total) by mouth daily. 30 tablet 2   docusate sodium  (COLACE) 100 MG capsule Take 1 capsule (100 mg total) by mouth 2 (two) times daily.     furosemide  (LASIX ) 20 MG tablet Take 1 tablet (20 mg total) by mouth daily as needed. 30 tablet 0   lidocaine  (XYLOCAINE ) 2 % solution Use as directed 15 mLs in the mouth or throat every 6 (six) hours as needed for mouth pain (Mix with 15 ml of maalox and swish and spit). 100 mL 2   lidocaine -prilocaine  (EMLA ) cream Apply 1 Application topically as needed. 30 g 0   mirtazapine  (REMERON ) 15 MG tablet Take 1 tablet (15 mg total) by mouth at bedtime. 30 tablet 2   ondansetron  (ZOFRAN ) 8 MG tablet TAKE 1 TABLET BY MOUTH EVERY 8 HOURS AS NEEDED FOR NAUSEA FOR VOMITING 90 tablet 5   oxyCODONE  (OXY IR/ROXICODONE ) 5 MG immediate release tablet Take 2 tablets (10 mg total) by mouth every 6 (six) hours as needed for severe pain (pain score 7-10). 240 tablet 0   pantoprazole  (PROTONIX ) 20 MG tablet Take 1 tablet (20 mg total) by  mouth daily. 30 tablet 5   prochlorperazine  (COMPAZINE ) 10 MG tablet Take 1 tablet (10 mg total) by mouth every 6 (six) hours as needed for nausea or vomiting. 30 tablet 5   No current facility-administered medications for this visit.    Physical Findings:  vitals were not taken for this visit.   /10 Unable to assess due to telephone follow-up visit format.  Lab Findings: Lab Results  Component Value Date   WBC 6.4 06/15/2024   HGB 12.3 06/15/2024   HCT 37.4 06/15/2024   MCV 100.8 (H) 06/15/2024   PLT 193 06/15/2024     Radiographic Findings: US  Abdomen Limited Result Date: 06/10/2024 EXAM: Right Upper Quadrant Abdominal Ultrasound 06/10/2024 02:33:31 PM TECHNIQUE: Real-time ultrasonography of the right upper quadrant of the abdomen was performed. COMPARISON: CT of 05/06/2024. CLINICAL HISTORY: elevated LFTs. FINDINGS: LIVER: The liver demonstrates normal echogenicity. Mild intrahepatic ductal prominence is felt to be similar to the prior CT back  to 06/04/2023. No evidence of mass. BILIARY SYSTEM: Gallbladder surgically absent. The common bile duct is mildly dilated at maximally 12 mm, similar back to at least the CT of 06/04/2023. OTHER: No right upper quadrant ascites. IMPRESSION: 1. Status post cholecystectomy with minimal intra- and extrahepatic ductal dilatation, similar back to at least the CT of 06/04/2023. Given chronicity, indeterminate clinical significance. If there has been new elevation of bilirubin level, MRCP could be performed. Electronically signed by: Rockey Kilts MD 06/10/2024 03:08 PM EDT RP Workstation: [id number]   MR Brain W Wo Contrast Result Date: 06/09/2024 EXAM: MRI BRAIN WITH AND WITHOUT CONTRAST 06/09/2024 03:26:26 PM TECHNIQUE: Multiplanar multisequence MRI of the head/brain was performed with and without the administration of 6.1 mL gadobutrol  (GADAVIST ) 1 MMOL/ML intravenous contrast. The patient moved throughout the exam. COMPARISON: Head MRI 02/02/2024.  CLINICAL HISTORY: Brain metastases, assess treatment response; 3T SRS Protocol. Follow-up of treated lesions. FINDINGS: BRAIN AND VENTRICLES: No acute infarct, hemorrhage, midline shift, hydrocephalus, or extra axial fluid collection is evident. Encephalomalacia in the right cerebellar hemisphere is unchanged and without suspicious locally recurrent enhancement. Clustered small nodular foci of enhancement in the right occipital lobe have slightly enlarged from the prior study (series 1200 image 142). All other enhancing cerebral lesions described on the prior MRI are either stable or smaller in size and are annotated with single arrows on series 1200. There are numerous additional small enhancing supratentorial and infratentorial lesions which are either new or in retrospect larger than on the prior MRI as follows (with reference to series 1200 and annotated with double arrows): 1.  3 mm inferior right cerebellum (image 96) 2.  7 mm lateral left cerebellum (image 107) 3.  5 mm anterior right temporal lobe (image 118) 4.  5 mm anterior left temporal lobe (image 124) 5.  2 mm medial left temporal lobe (image 127) 6.  3 mm posteromedial left temporal lobe (image 128) 7.  5 mm posterosuperior left temporal lobe (image 159) 8.  3 mm anterior left frontal lobe (image 171) 9.  3 mm medial posterior left frontal lobe (image 193) 10. 4 mm high posterior right frontal lobe (image 227) There is no significant brain edema. No suspicious marrow lesion. ORBITS: No acute abnormality. SINUSES: No acute abnormality. BONES AND SOFT TISSUES: Suboccipital craniectomy. No suspicious marrow lesion. IMPRESSION: 1. 10 brain metastases which are either new or in retrospect larger from 02/02/2024. 2. Slight interval enlargement of clustered nodular enhancing lesions in the right occipital lobe. 3. Other small brain metastases are unchanged. Electronically signed by: Dasie Hamburg MD 06/09/2024 04:22 PM EDT RP Workstation: [id number]     IMPRESSION: 1. 48 yo woman with 11 new brain metastases secondary to metastatic breast cancer.   Today, we talked to the patient about the findings on her recent MRI brain scan. We discussed the natural history of metastatic breast carcinoma and general treatment, highlighting the role of radiotherapy in the management of brain metastasis. The options include whole brain irradiation versus stereotactic radiosurgery. There are pros and cons associated with each of these potential treatment options. Whole brain radiotherapy would treat the known metastatic deposits and help provide some reduction of risk for future brain metastases. However, whole brain radiotherapy carries potential risks including hair loss, subacute somnolence, and neurocognitive changes including a possible reduction in short-term memory. Whole brain radiotherapy also may carry a lower likelihood of tumor control at the treatment sites because of the low-dose used. Stereotactic radiosurgery carries a higher likelihood for local  tumor control at the targeted sites with lower associated risk for neurocognitive changes such as memory loss. However, the use of stereotactic radiosurgery in this setting may leave the patient at increased risk for new brain metastases elsewhere in the brain as high as 50-60%. Accordingly, patients who receive stereotactic radiosurgery in this setting should undergo ongoing surveillance imaging with brain MRI more frequently in order to identify and treat new small brain metastases before they become symptomatic. Stereotactic radiosurgery does carry some different risks, including a risk of radionecrosis.  PLAN: Today, we reviewed the findings and workup thus far with the patient. We discussed the dilemma regarding whole brain radiotherapy versus stereotactic radiosurgery. We discussed the pros and cons of each. We also discussed the logistics and delivery of each. We reviewed the results associated with each  of the treatments described above. The patient seems to understand the treatment options and the recommendation to proceed with whole brain radiation at this point due to the frequency of new lesions despite ENHERTU  systemic therapy. She is a bit overwhelmed with the news and undecided regarding whether she wants to proceed with treatments here in [facility] versus [place] or possibly considering getting a second opinion with Dr. Elenor Sayer at [place]. She would like to take a few days to process the information and will call us  next week with her decision. We are happy to make a referral to [facility] or [facility] if she wishes. She appears to have a good understanding of her disease and our treatment recommendations which are of curative intent.  We will share our discussion with Dr. Davonna and await her decision.  We personally spent 60 minutes in this encounter including chart review, reviewing radiological studies, telephone conversation with the patient, entering orders, coordinating her care and completing documentation.     Sabra MICAEL Rusk, PA-C

## 2024-06-21 ENCOUNTER — Telehealth: Payer: Self-pay | Admitting: Radiation Therapy

## 2024-06-21 NOTE — Telephone Encounter (Signed)
 Left message for pt requesting a call back about what she has decided regarding having whole brain treatment here in [place] or if she would prefer a referral to receive treatment closer to home. Pt has my contact information and was encouraged to call back.   Devere Perch R.T(R)(T) Radiation Special Procedures Lead

## 2024-06-22 ENCOUNTER — Telehealth: Payer: Self-pay | Admitting: Radiation Therapy

## 2024-06-22 NOTE — Telephone Encounter (Addendum)
 Left a message for pt requesting a call back to let us  know if she wants to move forward with treatment here or would prefer a referral to be treated closer to home.  Devere Perch R.T(R)(T) Radiation Special Procedures Lead    Message was also sent to Dr. Davonna and her nurse, Donald, at [facility] at [facility] to make them aware of the recommendations for treatment and that we have not heard back from [place].

## 2024-06-24 ENCOUNTER — Other Ambulatory Visit: Payer: Self-pay

## 2024-06-29 ENCOUNTER — Encounter: Payer: Self-pay | Admitting: Licensed Clinical Social Worker

## 2024-06-29 ENCOUNTER — Ambulatory Visit
Admission: RE | Admit: 2024-06-29 | Discharge: 2024-06-29 | Disposition: A | Discharge status: Home / Self Care | Payer: MEDICAID | Source: Ambulatory Visit | Attending: Urology | Admitting: Urology

## 2024-06-29 DIAGNOSIS — Z5112 Encounter for antineoplastic immunotherapy: Secondary | ICD-10-CM | POA: Insufficient documentation

## 2024-06-29 DIAGNOSIS — C7931 Secondary malignant neoplasm of brain: Secondary | ICD-10-CM | POA: Insufficient documentation

## 2024-06-29 DIAGNOSIS — Z1731 Human epidermal growth factor receptor 2 positive status: Secondary | ICD-10-CM | POA: Diagnosis not present

## 2024-06-29 DIAGNOSIS — C7951 Secondary malignant neoplasm of bone: Secondary | ICD-10-CM | POA: Insufficient documentation

## 2024-06-29 DIAGNOSIS — Z510 Encounter for antineoplastic radiation therapy: Secondary | ICD-10-CM | POA: Diagnosis not present

## 2024-06-29 DIAGNOSIS — C787 Secondary malignant neoplasm of liver and intrahepatic bile duct: Secondary | ICD-10-CM | POA: Insufficient documentation

## 2024-06-29 DIAGNOSIS — C50911 Malignant neoplasm of unspecified site of right female breast: Secondary | ICD-10-CM | POA: Insufficient documentation

## 2024-06-29 NOTE — Progress Notes (Signed)
  Radiation Oncology         [phone] ________________________________  Name: Sara [name] MRN: [id number]  Date: 06/29/2024  DOB: July 25, 1976  SIMULATION AND TREATMENT PLANNING NOTE    ICD-10-CM   1. Breast cancer metastasized to brain, right (HCC)  C50.911    C79.31       DIAGNOSIS:  48 yo woman with 11 new brain metastases secondary to metastatic breast cancer.   NARRATIVE:  The patient was brought to the CT Simulation planning suite.  Identity was confirmed.  All relevant records and images related to the planned course of therapy were reviewed.  The patient freely provided informed written consent to proceed with treatment after reviewing the details related to the planned course of therapy. The consent form was witnessed and verified by the simulation staff.  Then, the patient was set-up in a stable reproducible  supine position for radiation therapy.  CT images were obtained.  Surface markings were placed.  The CT images were loaded into the planning software.  Then the target and avoidance structures were contoured.  Treatment planning then occurred.  The radiation prescription was entered and confirmed.  Then, I designed and supervised the construction of a total of 3 medically necessary complex treatment device including a custom made thermoplastic mask used for immobilization, and two MLC collimator apertures for radiotherapy from the right and left side, with independent collimation for each to account for beam divergence.  I have requested : Isodose Plan.    PLAN:  The whole brain will be treated to 30 Gy in 10 fractions.  ________________________________  [name], M.D.

## 2024-06-30 ENCOUNTER — Other Ambulatory Visit: Payer: Self-pay | Admitting: Oncology

## 2024-06-30 DIAGNOSIS — C50911 Malignant neoplasm of unspecified site of right female breast: Secondary | ICD-10-CM

## 2024-07-03 ENCOUNTER — Other Ambulatory Visit: Payer: Self-pay

## 2024-07-05 DIAGNOSIS — Z510 Encounter for antineoplastic radiation therapy: Secondary | ICD-10-CM | POA: Diagnosis not present

## 2024-07-06 ENCOUNTER — Other Ambulatory Visit: Payer: Self-pay

## 2024-07-06 ENCOUNTER — Ambulatory Visit
Admission: RE | Admit: 2024-07-06 | Discharge: 2024-07-06 | Disposition: A | Payer: MEDICAID | Source: Ambulatory Visit | Attending: Radiation Oncology

## 2024-07-06 DIAGNOSIS — Z510 Encounter for antineoplastic radiation therapy: Secondary | ICD-10-CM | POA: Diagnosis not present

## 2024-07-06 LAB — RAD ONC ARIA SESSION SUMMARY
Course Elapsed Days: 0
Plan Fractions Treated to Date: 1
Plan Prescribed Dose Per Fraction: 3 Gy
Plan Total Fractions Prescribed: 10
Plan Total Prescribed Dose: 30 Gy
Reference Point Dosage Given to Date: 3 Gy
Reference Point Session Dosage Given: 3 Gy
Session Number: 1

## 2024-07-07 ENCOUNTER — Inpatient Hospital Stay: Payer: MEDICAID | Attending: Oncology | Admitting: Oncology

## 2024-07-07 ENCOUNTER — Encounter: Payer: Self-pay | Admitting: Licensed Clinical Social Worker

## 2024-07-07 ENCOUNTER — Other Ambulatory Visit: Payer: Self-pay | Admitting: *Deleted

## 2024-07-07 ENCOUNTER — Other Ambulatory Visit: Payer: Self-pay

## 2024-07-07 ENCOUNTER — Ambulatory Visit
Admission: RE | Admit: 2024-07-07 | Discharge: 2024-07-07 | Disposition: A | Discharge status: Home / Self Care | Payer: MEDICAID | Source: Ambulatory Visit | Attending: Radiation Oncology | Admitting: Radiation Oncology

## 2024-07-07 ENCOUNTER — Inpatient Hospital Stay: Payer: MEDICAID

## 2024-07-07 VITALS — BP 115/77 | HR 88 | Temp 97.6°F | Resp 18

## 2024-07-07 VITALS — BP 122/82

## 2024-07-07 DIAGNOSIS — G893 Neoplasm related pain (acute) (chronic): Secondary | ICD-10-CM | POA: Diagnosis not present

## 2024-07-07 DIAGNOSIS — R7989 Other specified abnormal findings of blood chemistry: Secondary | ICD-10-CM

## 2024-07-07 DIAGNOSIS — F419 Anxiety disorder, unspecified: Secondary | ICD-10-CM

## 2024-07-07 DIAGNOSIS — C7951 Secondary malignant neoplasm of bone: Secondary | ICD-10-CM | POA: Insufficient documentation

## 2024-07-07 DIAGNOSIS — Z510 Encounter for antineoplastic radiation therapy: Secondary | ICD-10-CM | POA: Diagnosis not present

## 2024-07-07 DIAGNOSIS — C50911 Malignant neoplasm of unspecified site of right female breast: Secondary | ICD-10-CM

## 2024-07-07 DIAGNOSIS — I159 Secondary hypertension, unspecified: Secondary | ICD-10-CM

## 2024-07-07 DIAGNOSIS — C7931 Secondary malignant neoplasm of brain: Secondary | ICD-10-CM | POA: Insufficient documentation

## 2024-07-07 DIAGNOSIS — Z1731 Human epidermal growth factor receptor 2 positive status: Secondary | ICD-10-CM | POA: Insufficient documentation

## 2024-07-07 DIAGNOSIS — Z5112 Encounter for antineoplastic immunotherapy: Secondary | ICD-10-CM | POA: Insufficient documentation

## 2024-07-07 DIAGNOSIS — C787 Secondary malignant neoplasm of liver and intrahepatic bile duct: Secondary | ICD-10-CM | POA: Insufficient documentation

## 2024-07-07 LAB — COMPREHENSIVE METABOLIC PANEL WITH GFR
ALT: 37 U/L (ref 0–44)
AST: 32 U/L (ref 15–41)
Albumin: 3.9 g/dL (ref 3.5–5.0)
Alkaline Phosphatase: 106 U/L (ref 38–126)
Anion gap: 10 (ref 5–15)
BUN: 17 mg/dL (ref 6–20)
CO2: 27 mmol/L (ref 22–32)
Calcium: 9 mg/dL (ref 8.9–10.3)
Chloride: 101 mmol/L (ref 98–111)
Creatinine, Ser: 0.91 mg/dL (ref 0.44–1.00)
GFR, Estimated: >60 mL/min (ref >60)
Glucose, Bld: 103 mg/dL — ABNORMAL HIGH (ref 70–99)
Potassium: 3.9 mmol/L (ref 3.5–5.1)
Sodium: 138 mmol/L (ref 135–145)
Total Bilirubin: 0.2 mg/dL (ref 0.0–1.2)
Total Protein: 6.3 g/dL — ABNORMAL LOW (ref 6.5–8.1)

## 2024-07-07 LAB — RAD ONC ARIA SESSION SUMMARY
Course Elapsed Days: 1
Plan Fractions Treated to Date: 2
Plan Prescribed Dose Per Fraction: 3 Gy
Plan Total Fractions Prescribed: 10
Plan Total Prescribed Dose: 30 Gy
Reference Point Dosage Given to Date: 6 Gy
Reference Point Session Dosage Given: 3 Gy
Session Number: 2

## 2024-07-07 LAB — URINE DRUG SCREEN
Amphetamines: NEGATIVE
Barbiturates: NEGATIVE
Benzodiazepines: POSITIVE — AB
Cocaine: NEGATIVE
Fentanyl: NEGATIVE
Methadone Scn, Ur: NEGATIVE
Opiates: NEGATIVE
Tetrahydrocannabinol: POSITIVE — AB

## 2024-07-07 LAB — CBC WITH DIFFERENTIAL/PLATELET
Abs Immature Granulocytes: 0.01 K/uL (ref 0.00–0.07)
Basophils Absolute: 0.1 K/uL (ref 0.0–0.1)
Basophils Relative: 1 %
Eosinophils Absolute: 0.3 K/uL (ref 0.0–0.5)
Eosinophils Relative: 5 %
HCT: 35 % — ABNORMAL LOW (ref 36.0–46.0)
Hemoglobin: 11.5 g/dL — ABNORMAL LOW (ref 12.0–15.0)
Immature Granulocytes: 0 %
Lymphocytes Relative: 29 %
Lymphs Abs: 1.7 K/uL (ref 0.7–4.0)
MCH: 33 pg (ref 26.0–34.0)
MCHC: 32.9 g/dL (ref 30.0–36.0)
MCV: 100.6 fL — ABNORMAL HIGH (ref 80.0–100.0)
Monocytes Absolute: 1 K/uL (ref 0.1–1.0)
Monocytes Relative: 17 %
Neutro Abs: 2.8 K/uL (ref 1.7–7.7)
Neutrophils Relative %: 48 %
Platelets: 206 K/uL (ref 150–400)
RBC: 3.48 MIL/uL — ABNORMAL LOW (ref 3.87–5.11)
RDW: 12.8 % (ref 11.5–15.5)
WBC: 5.8 K/uL (ref 4.0–10.5)
nRBC: 0 % (ref 0.0–0.2)

## 2024-07-07 LAB — MAGNESIUM: Magnesium: 2.1 mg/dL (ref 1.7–2.4)

## 2024-07-07 MED ORDER — ALPRAZOLAM 0.5 MG PO TABS: 0.5000 mg | ORAL_TABLET | Freq: Three times a day (TID) | ORAL | 0 refills | Status: DC | PRN

## 2024-07-07 MED ORDER — PALONOSETRON HCL INJECTION 0.25 MG/5ML
0.2500 mg | Freq: Once | INTRAVENOUS | Status: AC
  Administered 2024-07-07: 0.25 mg via INTRAVENOUS
  Filled 2024-07-07: qty 5

## 2024-07-07 MED ORDER — FAMOTIDINE IN NACL 20-0.9 MG/50ML-% IV SOLN
20.0000 mg | Freq: Once | INTRAVENOUS | Status: AC
  Administered 2024-07-07: 20 mg via INTRAVENOUS
  Filled 2024-07-07: qty 50

## 2024-07-07 MED ORDER — DEXAMETHASONE SODIUM PHOSPHATE 10 MG/ML IJ SOLN
10.0000 mg | Freq: Once | INTRAMUSCULAR | Status: AC
  Administered 2024-07-07: 10 mg via INTRAVENOUS

## 2024-07-07 MED ORDER — SODIUM CHLORIDE 0.9 % IV SOLN
150.0000 mg | Freq: Once | INTRAVENOUS | Status: AC
  Administered 2024-07-07: 150 mg via INTRAVENOUS
  Filled 2024-07-07: qty 150

## 2024-07-07 MED ORDER — DEXTROSE 5 % IV SOLN: Freq: Once | INTRAVENOUS | Status: AC

## 2024-07-07 MED ORDER — OXYCODONE HCL 5 MG PO TABS: 10.0000 mg | ORAL_TABLET | Freq: Four times a day (QID) | ORAL | 0 refills | Status: DC | PRN

## 2024-07-07 MED ORDER — FAM-TRASTUZUMAB DERUXTECAN-NXKI CHEMO 100 MG IV SOLR
340.0000 mg | Freq: Once | INTRAVENOUS | Status: AC
  Administered 2024-07-07: 340 mg via INTRAVENOUS
  Filled 2024-07-07: qty 17

## 2024-07-07 NOTE — Patient Instructions (Signed)
 [facility]  - [facility]  Discharge Instructions: Thank you for choosing [facility] to provide your oncology and hematology care.  If you have a lab appointment with the Cancer Center - please note that after April 8th, 2024, all labs will be drawn in the cancer center.  You do not have to check in or register with the main entrance as you have in the past but will complete your check-in in the cancer center.  Wear comfortable clothing and clothing appropriate for easy access to any Portacath or PICC line.   We strive to give you quality time with your provider. You may need to reschedule your appointment if you arrive late (15 or more minutes).  Arriving late affects you and other patients whose appointments are after yours.  Also, if you miss three or more appointments without notifying the office, you may be dismissed from the clinic at the provider's discretion.      For prescription refill requests, have your pharmacy contact our office and allow 72 hours for refills to be completed.    Today you received the following chemotherapy and/or immunotherapy agents Enhertu   To help prevent nausea and vomiting after your treatment, we encourage you to take your nausea medication as directed.  Fam-Trastuzumab Deruxtecan Injection What is this medication? FAM-TRASTUZUMAB DERUXTECAN (fam-tras TOOZ eu mab DER ux TEE kan) treats some types of cancer. It works by blocking a protein that causes cancer cells to grow and multiply. This helps to slow or stop the spread of cancer cells. This medicine may be used for other purposes; ask your health care provider or pharmacist if you have questions. COMMON BRAND NAME(S): ENHERTU What should I tell my care team before I take this medication? They need to know if you have any of these conditions: Heart disease Heart failure Infection, especially a viral infection, such as chickenpox, cold sores, or  herpes Liver disease Lung or breathing disease, such as asthma or COPD An unusual or allergic reaction to fam-trastuzumab deruxtecan, other medications, foods, dyes, or preservatives Pregnant or trying to get pregnant Breast-feeding How should I use this medication? This medication is injected into a vein. It is given by your care team in a hospital or clinic setting. A special MedGuide will be given to you before each treatment. Be sure to read this information carefully each time. Talk to your care team about the use of this medication in children. Special care may be needed. Overdosage: If you think you have taken too much of this medicine contact a poison control center or emergency room at once. NOTE: This medicine is only for you. Do not share this medicine with others. What if I miss a dose? It is important not to miss your dose. Call your care team if you are unable to keep an appointment. What may interact with this medication? Interactions are not expected. This list may not describe all possible interactions. Give your health care provider a list of all the medicines, herbs, non-prescription drugs, or dietary supplements you use. Also tell them if you smoke, drink alcohol, or use illegal drugs. Some items may interact with your medicine. What should I watch for while using this medication? Visit your care team for regular checks on your progress. Tell your care team if your symptoms do not start to get better or if they get worse. This medication may increase your risk of getting an infection. Call  your care team for advice if you get a fever, chills, sore throat, or other symptoms of a cold or flu. Do not treat yourself. Try to avoid being around people who are sick. Avoid taking medications that contain aspirin, acetaminophen, ibuprofen, naproxen, or ketoprofen unless instructed by your care team. These medications may hide a fever. Be careful brushing or flossing your teeth or  using a toothpick because you may get an infection or bleed more easily. If you have any dental work done, tell your dentist you are receiving this medication. This medication may cause dry eyes and blurred vision. If you wear contact lenses, you may feel some discomfort. Lubricating eye drops may help. See your care team if the problem does not go away or is severe. Talk to your care team if you may be pregnant. Serious birth defects can occur if you take this medication during pregnancy and for 7 months after the last dose. If your partner can get pregnant, use a condom during sex while taking this medication and for 4 months after the last dose. Do not breastfeed while taking this medication and for 7 months after the last dose. This medication may cause infertility. Talk to your care team if you are concerned about your fertility. What side effects may I notice from receiving this medication? Side effects that you should report to your care team as soon as possible: Allergic reactions--skin rash, itching, hives, swelling of the face, lips, tongue, or throat Dry cough, shortness of breath or trouble breathing Infection--fever, chills, cough, sore throat, wounds that don't heal, pain or trouble when passing urine, general feeling of discomfort or being unwell Heart failure--shortness of breath, swelling of the ankles, feet, or hands, sudden weight gain, unusual weakness or fatigue Unusual bruising or bleeding Side effects that usually do not require medical attention (report these to your care team if they continue or are bothersome): Constipation Diarrhea Hair loss Muscle pain Nausea Vomiting This list may not describe all possible side effects. Call your doctor for medical advice about side effects. You may report side effects to FDA at 1-800-FDA-1088. Where should I keep my medication? This medication is given in a hospital or clinic. It will not be stored at home. NOTE: This sheet is a  summary. It may not cover all possible information. If you have questions about this medicine, talk to your doctor, pharmacist, or health care provider.  2024 Elsevier/Gold Standard (2023-04-03 00:00:00)  BELOW ARE SYMPTOMS THAT SHOULD BE REPORTED IMMEDIATELY: *FEVER GREATER THAN 100.4 F (38 C) OR HIGHER *CHILLS OR SWEATING *NAUSEA AND VOMITING THAT IS NOT CONTROLLED WITH YOUR NAUSEA MEDICATION *UNUSUAL SHORTNESS OF BREATH *UNUSUAL BRUISING OR BLEEDING *URINARY PROBLEMS (pain or burning when urinating, or frequent urination) *BOWEL PROBLEMS (unusual diarrhea, constipation, pain near the anus) TENDERNESS IN MOUTH AND THROAT WITH OR WITHOUT PRESENCE OF ULCERS (sore throat, sores in mouth, or a toothache) UNUSUAL RASH, SWELLING OR PAIN  UNUSUAL VAGINAL DISCHARGE OR ITCHING   Items with * indicate a potential emergency and should be followed up as soon as possible or go to the Emergency Department if any problems should occur.  Please show the CHEMOTHERAPY ALERT CARD or IMMUNOTHERAPY ALERT CARD at check-in to the Emergency Department and triage nurse.  Should you have questions after your visit or need to cancel or reschedule your appointment, please contact [facility] [place] - A DEPT OF [facility] Brideg[facility] [phone]  and follow the prompts.  Office hours are  8:00 a.m. to 4:30 p.m. Monday - Friday. Please note that voicemails left after 4:00 p.m. may not be returned until the following business day.  We are closed weekends and major holidays. You have access to a nurse at all times for urgent questions. Please call the main number to the clinic [phone] and follow the prompts.  For any non-urgent questions, you may also contact your provider using MyChart. We now offer e-Visits for anyone 65 and older to request care online for non-urgent symptoms. For details visit [email or web address].   Also download the MyChart app! Go to the app store, search "MyChart", open the  app, select [place], and log in with your MyChart username and password.

## 2024-07-07 NOTE — Progress Notes (Signed)
 Patient Care Team: Orpha Yancey LABOR, MD as PCP - General (Internal Medicine) Celestia Joesph SQUIBB, RN as Oncology Nurse Navigator (Oncology)  Clinic Day:  07/07/2024  Referring physician: Orpha Yancey LABOR, MD   CHIEF COMPLAINT:  CC: HER2 positive metastatic breast carcinoma    ASSESSMENT & PLAN:   Assessment & Plan: Sara [name]  is a 48 y.o. 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  Rest of the metastatic areas in liver and bone had significant treatment response.   - Labs reviewed today: Creatinine: Normal, alkaline phosphatase normal, normal LFTs, CBC: Hemoglobin: 11.5, WBC and platelets are normal. -Continue current treatment with Enhertu .  Tolerating well.  Cycle 20-day 1 today.   -Physical exam stable today.  Will proceed with treatment today. - Started whole brain radiation yesterday. -Continue CT scan every 3 months.  Next due 07/2024.  Ordered today. -Echo from 01/2024 normal.  Repeat in 6 months.  Will order today - Recommended dental clearance to start bone modifying agents.  Patient is seeing dentist in December.  Return to clinic in 3 weeks with CT scan to assess response to treatment  Breast cancer metastasized to brain 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  Had multiple brain lesions at that time. Had 2 courses of SRS one in September 2024 and one in July 2025 Recent MRI with multiple brain lesions.  Started whole brain radiation yesterday   - Continue to follow-up with Dr. Patrcia with radiation oncology - Repeat MRI as  scheduled by radiation oncology -Will continue to obtain frequent MRIs every 3 to 6 months  Chronic pain Change Percocet 7.5 to oxycodone  10 mg every 6 hours for elevated liver enzymes.  -Continue oxycodone  10 mg every 6 hours as needed for pain  Nausea and vomiting -Continue Zofran  8 mg as needed every 8 hours.  Constipation Likely related to chemotherapy and use of anti-diarrheal medications.  Hypertension Blood pressure well-controlled with amlodipine .  - Continue amlodipine  5 mg daily.  Anxiety Taking Xanax  0.5 mg daily as needed.  Patient reports insufficient dosing.  - Will increase to Xanax  0.5 mg 3 times daily as needed  Elevated liver enzymes Cause unclear, not related to current prescribed medications.  Unsure about over-the-counter medications/drug use. Patient previously had an episode of elevated liver enzymes that etiology was unknown and resolved by itself. Ultrasound did not show any concerning areas Resolved at this time.  Macrocytic anemia Hemoglobin level at 11.5, likely related to radiation therapy. Expected to improve over time.  - Continue to monitor hemoglobin levels. - Will obtain nutritional panel with next blood draw.   The patient understands the plans discussed today and is in agreement with them.  She knows to contact our office if she develops concerns prior to her next appointment.  The total time spent in the appointment was 45 minutes for the encounter with patient, including review of chart and various tests results, discussions about plan of care and coordination of care plan   Mickiel Dry, MD  [facility] [place] - A DEPT OF [facility] [address] [place]  [address] Dept: [phone] Dept Fax: [phone]   Orders Placed This Encounter  Procedures   CT CHEST ABDOMEN PELVIS W CONTRAST    Standing Status:   Future    Expected Date:   07/21/2024    Expiration  Date:   07/07/2025    If indicated for the ordered procedure, I authorize the administration of contrast media per Radiology protocol:   Yes    Does the patient have a contrast media/X-ray dye allergy?:   No    Preferred imaging location?:   [facility]    If indicated for the ordered procedure, I authorize the administration of oral contrast media per Radiology protocol:   Yes    Is patient pregnant?:   No   Ferritin    Standing Status:   Future    Expected Date:   07/25/2024    Expiration Date:   10/23/2024   Folate    Standing Status:   Future    Expected Date:   07/25/2024    Expiration Date:   10/23/2024   Vitamin B12    Standing Status:   Future    Expected Date:   07/25/2024    Expiration Date:   10/23/2024   Iron and TIBC    Standing Status:   Future    Expected Date:   07/25/2024    Expiration Date:   10/23/2024   ECHOCARDIOGRAM COMPLETE    Standing Status:   Future    Expected Date:   08/06/2024    Expiration Date:   07/07/2025    Where should this test be performed:   [facility]    Perflutren DEFINITY (image enhancing agent) should be administered unless hypersensitivity or allergy exist:   Administer Perflutren    Reason for exam-Echo:   Chemo  Z09     ONCOLOGY HISTORY:   Diagnosis: HER2 positive metastatic breast carcinoma   -Presentation: Palpable knot in right breast in July 2022 which noticeably increased in size by November 2022 -10/01/2021: Bilateral Diagnostic mammogram and US : Large palpable right breast mass involving the entire central right breast measuring at least 5 cm with associated nipple flattening and overlying skin thickening and generalized erythema and marked tenderness to palpation. Findings are concerning for inflammatory breast cancer. At least 3 lymph nodes with borderline cortical thickening in the right axilla. -10/08/2021: Right breast mass and axillary lymph node biopsy.  Pathology: Poorly differentiated ductal carcinoma. The tumor cells are  Her2 (3+) positive, ER positive (5%), and PR negative.  Ki-67 is 25%. -11/30/2021: Initial PET: Extensive hypermetabolic tumor involving the right breast. There is also focal invasion of the lower pectoralis muscle. Metastatic mediastinal and hilar lymphadenopathy and hepatic metastatic disease. Diffuse lytic destructive metastatic bone disease. Cortical lesion involving the left femoral neck with definite risk for pathologic fracture. Multiple rib and spine lesions but no canal compromise is demonstrated. -12/03/2021-02/25/2022: 4 cycles of THP -11/10/2022: CT CAP: Significantly improved disease in right breast, lymphadenopathy, lytic bone lesions. -12/24/2021: CA 15-3: 24.4. CA 27.29: 24.5 -11/11/2022: MRI Brain: 3.8 cm right cerebellar metastasis with vasogenic edema and prominent mass effect in the posterior fossa. There is downward descent of the tonsils and fourth ventricular obstruction causing hydrocephalus. Second 13 mm metastasis in the right parietal lobe. -11/12/2022: Cerebellar mass resection.  Pathology: Metastatic carcinoma consistent with breast primary. The tumor cells are Positive for Her2 (3+).  -12/03/2022: MRI Brain: Mild interval increase in size of a right parietal enhancing lesion, now measuring  approximately 12 x 13 mm (previously 11 x 10 mm). Mild increase in surrounding edema. New punctate focus a of enhancement in the right caudate, concerning for new metastasis. Prior right cerebellar metastatic lesion resection without evidence of residual lesion at this site.  -12/05/2022-12/19/2022: 20Gy in single fraction to her two intact metastases in the right parietal lobe and flax cerebri -01/01/2023-current: Enhertu  5.4mg /kg every 3 weeks -05/13/2023: Stereotactic radiosurgery (Brain SRS) -2024-current: Disease remains stable in chest, abdomen, and pelvis per imaging.  -09/14/2023: MRI Brain: Stable appearance of brain metastasis.  -02/02/2024: MRI Brain: There are multiple new  small cerebral metastases present. Otherwise stable brain metastasis.  -02/18/2024-02/26/2024: SRS to brain lesions -06/09/2024: MRI brain: 10 brain metastases which are either new or in retrospect larger from 02/02/2024. Slight interval enlargement of clustered nodular enhancing lesions in the right occipital lobe.Other small brain metastases are unchanged.  -07/06/2024: Started whole brain radiation  Current Treatment:  Enhertu  5.4mg /kg every 3 weeks  INTERVAL HISTORY:   Discussed the use of AI scribe software for clinical note transcription with the patient, who gave verbal consent to proceed.  History of Present Illness Sara [name] is a 48 year old female who presents for follow-up regarding her ongoing breast cancer treatment and associated symptoms.  She is currently undergoing whole brain radiation therapy, which began on July 06, 2024, and is scheduled for ten treatments. She had her mask fitted last week and is receiving daily treatments this week, next week, and a couple of days the following week.  She has a history of elevated liver enzymes, which have since normalized. An ultrasound of her liver showed no abnormalities. She had her gallbladder removed in 2009.  She experiences low hemoglobin levels, currently at 11.5, which is slightly below normal.  She has a history of anxiety and depression, for which she has been taking alprazolam  for two years. She experiences panic attacks, stress, and depression, indicating her current dose may not be effective. She also takes Remeron , which she finds helpful. She has been on various antidepressants and nerve medications since she was 48 years old.  She experiences chest pain on the left side, which she attributes to anxiety. This pain has been present for six to eight months and sometimes feels like 'an elephant' on her chest.  She is currently taking Zofran  for nausea and medication for acid reflux. The Zofran  is helping with her  nausea.  She has been taking oxycodone  10 mg every six hours as needed for pain, but it is not effective. She previously found Percocet 7.5 mg effective for her pain management but wants to give her oxycodone  a few months before changing back to Percocet.  She has been using curcumin turmeric powder with extra virgin olive oil as a supplement.   I have reviewed the past medical history, past surgical history, social history and family history with the patient and they are unchanged from previous note.  ALLERGIES:  is allergic to hydrocodone , sulfa antibiotics, and morphine  and codeine.  MEDICATIONS:  Current Outpatient Medications  Medication Sig Dispense Refill   aluminum -magnesium  hydroxide 200-200 MG/5ML suspension Take 15 mLs by mouth every 6 (six) hours as needed for indigestion (Mix with 15 ml of lidocaine  2% and swish and spit). 355 mL 2   amLODipine  (NORVASC ) 10 MG tablet Take 1 tablet (10 mg total) by mouth daily. 30 tablet 2   docusate sodium  (COLACE) 100 MG capsule Take 1 capsule (100 mg total) by mouth 2 (two) times daily.  furosemide  (LASIX ) 20 MG tablet Take 1 tablet (20 mg total) by mouth daily as needed. 30 tablet 0   lidocaine  (XYLOCAINE ) 2 % solution Use as directed 15 mLs in the mouth or throat every 6 (six) hours as needed for mouth pain (Mix with 15 ml of maalox and swish and spit). 100 mL 2   lidocaine -prilocaine  (EMLA ) cream Apply 1 Application topically as needed. 30 g 0   mirtazapine  (REMERON ) 15 MG tablet Take 1 tablet (15 mg total) by mouth at bedtime. 30 tablet 2   ondansetron  (ZOFRAN ) 8 MG tablet TAKE 1 TABLET BY MOUTH EVERY 8 HOURS AS NEEDED FOR NAUSEA FOR VOMITING 90 tablet 5   pantoprazole  (PROTONIX ) 20 MG tablet Take 1 tablet (20 mg total) by mouth daily. 30 tablet 5   prochlorperazine  (COMPAZINE ) 10 MG tablet Take 1 tablet (10 mg total) by mouth every 6 (six) hours as needed for nausea or vomiting. 30 tablet 5   Turmeric (QC TUMERIC COMPLEX PO) Take 1 Dose  by mouth daily.     ALPRAZolam  (XANAX ) 0.5 MG tablet Take 1 tablet (0.5 mg total) by mouth 3 (three) times daily as needed for anxiety. 60 tablet 0   oxyCODONE  (OXY IR/ROXICODONE ) 5 MG immediate release tablet Take 2 tablets (10 mg total) by mouth every 6 (six) hours as needed for severe pain (pain score 7-10). 240 tablet 0   No current facility-administered medications for this visit.    REVIEW OF SYSTEMS:   Constitutional: Denies fevers, chills or abnormal weight loss Eyes: Denies blurriness of vision Ears, nose, mouth, throat, and face: Denies mucositis or sore throat Respiratory: Denies cough, dyspnea or wheezes Cardiovascular: Denies palpitation, chest discomfort or lower extremity swelling Gastrointestinal:  Denies nausea, heartburn or change in bowel habits Skin: Denies abnormal skin rashes Lymphatics: Denies new lymphadenopathy or easy bruising Neurological:Denies numbness, tingling or new weaknesses Behavioral/Psych: Mood is stable, no new changes  All other systems were reviewed with the patient and are negative.   VITALS:  Blood pressure 122/82.  Wt Readings from Last 3 Encounters:  07/07/24 141 lb 8.6 oz (64.2 kg)  06/15/24 137 lb 9.6 oz (62.4 kg)  06/07/24 134 lb 4.2 oz (60.9 kg)    There is no height or weight on file to calculate BMI.  Performance status (ECOG): 0 - Asymptomatic  PHYSICAL EXAM:   GENERAL:alert, no distress and comfortable SKIN: skin color, texture, turgor are normal, no rashes or significant lesions LYMPH:  no palpable lymphadenopathy in the cervical, axillary or inguinal LUNGS: clear to auscultation and percussion with normal breathing effort HEART: regular rate & rhythm and no murmurs and no lower extremity edema ABDOMEN:abdomen soft, non-tender and normal bowel sounds Musculoskeletal:no cyanosis of digits and no clubbing  NEURO: alert & oriented x 3 with fluent speech  LABORATORY DATA:  I have reviewed the data as listed     Component  Value Date/Time   NA 138 07/07/2024 0842   K 3.9 07/07/2024 0842   CL 101 07/07/2024 0842   CO2 27 07/07/2024 0842   GLUCOSE 103 (H) 07/07/2024 0842   BUN 17 07/07/2024 0842   CREATININE 0.91 07/07/2024 0842   CALCIUM 9.0 07/07/2024 0842   PROT 6.3 (L) 07/07/2024 0842   ALBUMIN  3.9 07/07/2024 0842   AST 32 07/07/2024 0842   ALT 37 07/07/2024 0842   ALKPHOS 106 07/07/2024 0842   BILITOT 0.2 07/07/2024 0842   GFRNONAA >60 07/07/2024 0842   GFRAA >60 10/25/2019 1224  Lab Results  Component Value Date   WBC 5.8 07/07/2024   NEUTROABS 2.8 07/07/2024   HGB 11.5 (L) 07/07/2024   HCT 35.0 (L) 07/07/2024   MCV 100.6 (H) 07/07/2024   PLT 206 07/07/2024    Latest Reference Range & Units 06/15/24 10:52  CA 15-3 0.0 - 25.0 U/mL 13.7  CA 27.29 0.0 - 38.6 U/mL 20.1    Latest Reference Range & Units 07/07/24 08:47  Methadone Scn, Ur NEGATIVE  NEGATIVE  Amphetamines NEGATIVE  NEGATIVE  Barbiturates NEGATIVE  NEGATIVE  Benzodiazepines NEGATIVE  POSITIVE !  Opiates NEGATIVE  NEGATIVE  COCAINE NEGATIVE  NEGATIVE  Tetrahydrocannabinol NEGATIVE  POSITIVE !  !: Data is abnormal RADIOGRAPHIC STUDIES: I have personally reviewed the radiological images as listed and agreed with the findings in the report.  US  Abdomen Limited EXAM: Right Upper Quadrant Abdominal Ultrasound 06/10/2024 02:33:31 PM  TECHNIQUE: Real-time ultrasonography of the right upper quadrant of the abdomen was performed.  COMPARISON: CT of 05/06/2024.  CLINICAL HISTORY: elevated LFTs.  FINDINGS:  LIVER: The liver demonstrates normal echogenicity. Mild intrahepatic ductal prominence is felt to be similar to the prior CT back to 06/04/2023. No evidence of mass.  BILIARY SYSTEM: Gallbladder surgically absent. The common bile duct is mildly dilated at maximally 12 mm, similar back to at least the CT of 06/04/2023.  OTHER: No right upper quadrant ascites.  IMPRESSION: 1. Status post cholecystectomy with  minimal intra- and extrahepatic ductal dilatation, similar back to at least the CT of 06/04/2023. Given chronicity, indeterminate clinical significance. If there has been new elevation of bilirubin level, MRCP could be performed.  Electronically signed by: Rockey Kilts MD 06/10/2024 03:08 PM EDT RP Workstation: [id number]

## 2024-07-07 NOTE — Progress Notes (Signed)
 Patient presents today for chemotherapy Enhertu  infusion. Patient is in satisfactory condition with no new complaints voiced.  Vital signs are stable.  Labs reviewed by Dr. Davonna during the office visit and all labs are within treatment parameters.  We will proceed with treatment per MD orders.   Treatment given today per MD orders. Tolerated infusion without adverse affects. Vital signs stable. No complaints at this time. Discharged from clinic ambulatory in stable condition. Alert and oriented x 3. F/U with [facility] as scheduled.

## 2024-07-07 NOTE — Progress Notes (Signed)
 Received ok from Dr Davonna to adjust dose of Enhertu  with new weight.  Weight: 64.2 kg  Enhertu  5.4 mg/kg = 340 mg  Niels Molt, PharmD Clinical Pharmacist

## 2024-07-07 NOTE — Progress Notes (Signed)
 SPIRITUAL CARE AND COUNSELING CONSULT NOTE   VISIT SUMMARY   Reason for Visit: Chaplain making scheduled follow-up with Pt I previously connected with.  Description of Visit: I entered the room and found Sara [name] sitting in the chair receiving her treatment, with no support person present.   Through exploration and life review Sara [name] shared with me her journey of cancer, weaving into it the story of her renewal of faith that began about 2 years ago.  She states she experienced a period of quitting when she states I basically gave up. A renewal of her faith gave her courage and strength to end a bad relationship and return to treatment.  She now wants to fight to stay with her daughters and her granddaughter.   Sara [name] has a real need of someone to listen- she spoke often of talking to her dog, and not being able to talk with her father whom she lives with, or her daughters who are no longer at home.  Plan of Care: I will continue to follow Sara [name] on a bi-weekly basis.   SPIRITUAL ENCOUNTER                                                                                                                                                                      Type of Visit: Follow up Care provided to:: Patient Referral source: Chaplain assessment Reason for visit: Routine spiritual support OnCall Visit: No   SPIRITUAL FRAMEWORK  Presenting Themes: Meaning/purpose/sources of inspiration, Goals in life/care, Coping tools, Values and beliefs, Impactful experiences and emotions, Courage hope and growth, Rituals and practive Values/beliefs: professes a Investment banker, operational; motivated by her daughters Sara [name] and Sara [name], and her granddaughter Sara [name] Community/Connection: Family, Faith community Strengths: Perseverance; Hope; Perspective Needs/Challenges/Barriers: Facing both chemo and radiation Patient Stress Factors: Exhausted, Family relationships, Health changes Family Stress Factors: Family  relationships   GOALS   Self/Personal Goals: Fight Clinical Care Goals: Promote peace and reduce anxiety   INTERVENTIONS   Spiritual Care Interventions Made: Established relationship of care and support, Compassionate presence, Reflective listening, Narrative/life review, Explored values/beliefs/practices/strengths, Meaning making, Prayer    INTERVENTION OUTCOMES   Outcomes: Connection to spiritual care, Reduced anxiety, Reduced isolation  SPIRITUAL CARE PLAN   Spiritual Care Issues Still Outstanding: Chaplain will continue to follow    Maude Roll, MDiv Chaplain, [facility] [email or web address] [phone] 07/07/2024 11:36 AM

## 2024-07-08 ENCOUNTER — Inpatient Hospital Stay: Payer: MEDICAID

## 2024-07-08 ENCOUNTER — Ambulatory Visit
Admission: RE | Admit: 2024-07-08 | Discharge: 2024-07-08 | Disposition: A | Discharge status: Home / Self Care | Payer: MEDICAID | Source: Ambulatory Visit | Attending: Radiation Oncology | Admitting: Radiation Oncology

## 2024-07-08 ENCOUNTER — Other Ambulatory Visit: Payer: Self-pay

## 2024-07-08 ENCOUNTER — Ambulatory Visit: Payer: MEDICAID

## 2024-07-08 DIAGNOSIS — Z510 Encounter for antineoplastic radiation therapy: Secondary | ICD-10-CM | POA: Diagnosis not present

## 2024-07-08 LAB — RAD ONC ARIA SESSION SUMMARY
Course Elapsed Days: 2
Plan Fractions Treated to Date: 3
Plan Prescribed Dose Per Fraction: 3 Gy
Plan Total Fractions Prescribed: 10
Plan Total Prescribed Dose: 30 Gy
Reference Point Dosage Given to Date: 9 Gy
Reference Point Session Dosage Given: 3 Gy
Session Number: 3

## 2024-07-08 LAB — CANCER ANTIGEN 15-3: CA 15-3: 12.7 U/mL (ref 0.0–25.0)

## 2024-07-08 LAB — CANCER ANTIGEN 27.29: CA 27.29: <9 U/mL (ref 0.0–38.6)

## 2024-07-08 MED ORDER — AMLODIPINE BESYLATE 5 MG PO TABS: 5.0000 mg | ORAL_TABLET | Freq: Every day | ORAL | 2 refills | Status: AC

## 2024-07-11 ENCOUNTER — Other Ambulatory Visit: Payer: Self-pay

## 2024-07-11 ENCOUNTER — Inpatient Hospital Stay: Payer: MEDICAID

## 2024-07-11 ENCOUNTER — Ambulatory Visit
Admission: RE | Admit: 2024-07-11 | Discharge: 2024-07-11 | Disposition: A | Discharge status: Home / Self Care | Payer: MEDICAID | Source: Ambulatory Visit | Attending: Radiation Oncology | Admitting: Radiation Oncology

## 2024-07-11 ENCOUNTER — Encounter: Payer: Self-pay | Admitting: Licensed Clinical Social Worker

## 2024-07-11 ENCOUNTER — Ambulatory Visit: Payer: MEDICAID

## 2024-07-11 DIAGNOSIS — Z510 Encounter for antineoplastic radiation therapy: Secondary | ICD-10-CM | POA: Diagnosis not present

## 2024-07-11 LAB — RAD ONC ARIA SESSION SUMMARY
Course Elapsed Days: 5
Plan Fractions Treated to Date: 4
Plan Prescribed Dose Per Fraction: 3 Gy
Plan Total Fractions Prescribed: 10
Plan Total Prescribed Dose: 30 Gy
Reference Point Dosage Given to Date: 12 Gy
Reference Point Session Dosage Given: 3 Gy
Session Number: 4

## 2024-07-12 ENCOUNTER — Encounter: Payer: Self-pay | Admitting: *Deleted

## 2024-07-12 ENCOUNTER — Telehealth: Payer: Self-pay

## 2024-07-12 ENCOUNTER — Inpatient Hospital Stay: Payer: MEDICAID

## 2024-07-12 ENCOUNTER — Other Ambulatory Visit: Payer: Self-pay | Admitting: *Deleted

## 2024-07-12 ENCOUNTER — Telehealth: Payer: Self-pay | Admitting: *Deleted

## 2024-07-12 ENCOUNTER — Ambulatory Visit: Payer: MEDICAID

## 2024-07-12 MED ORDER — LIDOCAINE-PRILOCAINE 2.5-2.5 % EX CREA: 1.0000 | TOPICAL_CREAM | CUTANEOUS | 0 refills | Status: AC | PRN

## 2024-07-12 MED ORDER — MAGIC MOUTHWASH W/LIDOCAINE: 5.0000 mL | Freq: Four times a day (QID) | ORAL | 0 refills | Status: DC | PRN

## 2024-07-12 NOTE — Telephone Encounter (Signed)
 Patient called to request magic mouthwash, as she has developed mouth sores.  Currently c/o severe headaches.  Discussed with Dr. Davonna.  Likely coming from brain radiation.  Recommendations sent to patient via [facility].

## 2024-07-12 NOTE — Telephone Encounter (Signed)
 Sara [name], 48 yo woman with 11 new brain metastases secondary to metastatic breast cancer.   Had 4 of 10 treatment so far.  Sara [name] called to cancel treatment today reports she will be in tomorrow 07/13/2024.  Reports has sore throat when swallowing, soreness to gums, fever 100.8, and hoarseness.  She reports she already spoke with her medical oncologist who has sent in script for magic mouthwash which she is planning to pick up today.  Reports she is still going to local Urgent care just to make should she isn't contagious.  RN to notify team of cancellation of treatment today.

## 2024-07-13 ENCOUNTER — Inpatient Hospital Stay: Payer: MEDICAID

## 2024-07-13 ENCOUNTER — Ambulatory Visit: Payer: MEDICAID

## 2024-07-13 ENCOUNTER — Ambulatory Visit (HOSPITAL_COMMUNITY): Payer: MEDICAID

## 2024-07-18 ENCOUNTER — Ambulatory Visit: Payer: MEDICAID

## 2024-07-19 ENCOUNTER — Ambulatory Visit: Payer: MEDICAID

## 2024-07-19 ENCOUNTER — Inpatient Hospital Stay: Payer: MEDICAID | Attending: Hematology

## 2024-07-19 ENCOUNTER — Ambulatory Visit (HOSPITAL_COMMUNITY): Payer: MEDICAID | Attending: Oncology

## 2024-07-19 DIAGNOSIS — Z170 Estrogen receptor positive status [ER+]: Secondary | ICD-10-CM | POA: Insufficient documentation

## 2024-07-19 DIAGNOSIS — Z5112 Encounter for antineoplastic immunotherapy: Secondary | ICD-10-CM | POA: Insufficient documentation

## 2024-07-19 DIAGNOSIS — C7931 Secondary malignant neoplasm of brain: Secondary | ICD-10-CM | POA: Insufficient documentation

## 2024-07-19 DIAGNOSIS — C50111 Malignant neoplasm of central portion of right female breast: Secondary | ICD-10-CM | POA: Insufficient documentation

## 2024-07-19 DIAGNOSIS — Z17420 Hormone receptor negative with human epidermal growth factor receptor 2 positive status: Secondary | ICD-10-CM | POA: Insufficient documentation

## 2024-07-19 NOTE — Progress Notes (Signed)
   07/19/24 1300  Spiritual Encounters  Type of Visit Follow up;Attempt (pt unavailable)  Referral source Chaplain assessment  Reason for visit Routine spiritual support  OnCall Visit No   Reason for Visit: Chaplain making scheduled check-in with Pt over the telephone for routine spiritual support.  Description of Visit: Sara [name] did not answer the phone. I left a voicemail stating that I was looking to follow up with her since I know she is going through chemo and radiation right now.  I stated that we wanted her to know we (spiritual care) are here for her to help in any way we can.  Plan of Care: I will continue to follow up with this Pt on a bi-weekly basis

## 2024-07-20 ENCOUNTER — Ambulatory Visit: Payer: MEDICAID

## 2024-07-20 ENCOUNTER — Inpatient Hospital Stay: Payer: MEDICAID

## 2024-07-21 ENCOUNTER — Other Ambulatory Visit: Payer: Self-pay | Admitting: *Deleted

## 2024-07-21 ENCOUNTER — Ambulatory Visit: Payer: MEDICAID

## 2024-07-21 ENCOUNTER — Ambulatory Visit
Admission: RE | Admit: 2024-07-21 | Discharge: 2024-07-21 | Disposition: A | Discharge status: Home / Self Care | Payer: MEDICAID | Source: Ambulatory Visit | Attending: Urology | Admitting: Urology

## 2024-07-21 ENCOUNTER — Other Ambulatory Visit (HOSPITAL_COMMUNITY): Payer: MEDICAID

## 2024-07-21 ENCOUNTER — Other Ambulatory Visit: Payer: Self-pay

## 2024-07-21 ENCOUNTER — Inpatient Hospital Stay: Payer: MEDICAID

## 2024-07-21 DIAGNOSIS — C7931 Secondary malignant neoplasm of brain: Secondary | ICD-10-CM | POA: Insufficient documentation

## 2024-07-21 DIAGNOSIS — Z510 Encounter for antineoplastic radiation therapy: Secondary | ICD-10-CM | POA: Insufficient documentation

## 2024-07-21 DIAGNOSIS — Z170 Estrogen receptor positive status [ER+]: Secondary | ICD-10-CM | POA: Insufficient documentation

## 2024-07-21 DIAGNOSIS — Z1731 Human epidermal growth factor receptor 2 positive status: Secondary | ICD-10-CM | POA: Diagnosis not present

## 2024-07-21 DIAGNOSIS — G893 Neoplasm related pain (acute) (chronic): Secondary | ICD-10-CM

## 2024-07-21 DIAGNOSIS — C50411 Malignant neoplasm of upper-outer quadrant of right female breast: Secondary | ICD-10-CM | POA: Diagnosis not present

## 2024-07-21 DIAGNOSIS — F419 Anxiety disorder, unspecified: Secondary | ICD-10-CM

## 2024-07-21 DIAGNOSIS — C50911 Malignant neoplasm of unspecified site of right female breast: Secondary | ICD-10-CM

## 2024-07-21 LAB — RAD ONC ARIA SESSION SUMMARY
Course Elapsed Days: 15
Plan Fractions Treated to Date: 5
Plan Prescribed Dose Per Fraction: 3 Gy
Plan Total Fractions Prescribed: 10
Plan Total Prescribed Dose: 30 Gy
Reference Point Dosage Given to Date: 15 Gy
Reference Point Session Dosage Given: 3 Gy
Session Number: 5

## 2024-07-21 MED ORDER — ALPRAZOLAM 0.5 MG PO TABS: 0.5000 mg | ORAL_TABLET | Freq: Three times a day (TID) | ORAL | 0 refills | Status: DC | PRN

## 2024-07-22 ENCOUNTER — Ambulatory Visit: Payer: MEDICAID

## 2024-07-22 ENCOUNTER — Inpatient Hospital Stay: Payer: MEDICAID

## 2024-07-22 ENCOUNTER — Other Ambulatory Visit: Payer: Self-pay

## 2024-07-22 ENCOUNTER — Ambulatory Visit
Admission: RE | Admit: 2024-07-22 | Discharge: 2024-07-22 | Disposition: A | Payer: MEDICAID | Source: Ambulatory Visit | Attending: Radiation Oncology

## 2024-07-22 ENCOUNTER — Encounter: Payer: Self-pay | Admitting: Licensed Clinical Social Worker

## 2024-07-22 ENCOUNTER — Inpatient Hospital Stay: Payer: MEDICAID | Admitting: Licensed Clinical Social Worker

## 2024-07-22 ENCOUNTER — Ambulatory Visit: Admission: RE | Admit: 2024-07-22 | Payer: MEDICAID | Source: Ambulatory Visit

## 2024-07-22 DIAGNOSIS — C50911 Malignant neoplasm of unspecified site of right female breast: Secondary | ICD-10-CM

## 2024-07-22 DIAGNOSIS — Z510 Encounter for antineoplastic radiation therapy: Secondary | ICD-10-CM | POA: Diagnosis not present

## 2024-07-22 LAB — RAD ONC ARIA SESSION SUMMARY
Course Elapsed Days: 16
Plan Fractions Treated to Date: 6
Plan Prescribed Dose Per Fraction: 3 Gy
Plan Total Fractions Prescribed: 10
Plan Total Prescribed Dose: 30 Gy
Reference Point Dosage Given to Date: 18 Gy
Reference Point Session Dosage Given: 3 Gy
Session Number: 6

## 2024-07-22 NOTE — Progress Notes (Signed)
 [facility] CSW Progress Note  Visual Merchandiser met with patient to follow-up on SDOH needs.    Interventions: Provided pt w/ a food and toiletry bag from the [employer].      Follow Up Plan:  Patient will contact CSW with any support or resource needs    Devere JONELLE Manna, LCSW Clinical Social Worker  Cancer Center    Patient is participating in a Managed Medicaid Plan:  Yes

## 2024-07-25 ENCOUNTER — Other Ambulatory Visit: Payer: Self-pay | Admitting: *Deleted

## 2024-07-25 ENCOUNTER — Inpatient Hospital Stay: Payer: MEDICAID

## 2024-07-25 ENCOUNTER — Other Ambulatory Visit: Payer: Self-pay

## 2024-07-25 ENCOUNTER — Inpatient Hospital Stay: Payer: MEDICAID | Admitting: Hematology and Oncology

## 2024-07-25 ENCOUNTER — Ambulatory Visit: Payer: MEDICAID

## 2024-07-25 ENCOUNTER — Ambulatory Visit: Admission: RE | Admit: 2024-07-25 | Discharge: 2024-07-25 | Payer: MEDICAID | Attending: Radiation Oncology

## 2024-07-25 DIAGNOSIS — Z510 Encounter for antineoplastic radiation therapy: Secondary | ICD-10-CM | POA: Diagnosis not present

## 2024-07-25 LAB — RAD ONC ARIA SESSION SUMMARY
Course Elapsed Days: 19
Plan Fractions Treated to Date: 7
Plan Prescribed Dose Per Fraction: 3 Gy
Plan Total Fractions Prescribed: 10
Plan Total Prescribed Dose: 30 Gy
Reference Point Dosage Given to Date: 21 Gy
Reference Point Session Dosage Given: 3 Gy
Session Number: 7

## 2024-07-25 MED ORDER — MAGIC MOUTHWASH W/LIDOCAINE: 5.0000 mL | Freq: Four times a day (QID) | ORAL | 2 refills | Status: AC | PRN

## 2024-07-26 ENCOUNTER — Ambulatory Visit: Payer: MEDICAID

## 2024-07-27 ENCOUNTER — Ambulatory Visit: Admission: RE | Admit: 2024-07-27 | Discharge: 2024-07-27 | Payer: MEDICAID | Attending: Radiation Oncology

## 2024-07-27 ENCOUNTER — Other Ambulatory Visit: Payer: Self-pay

## 2024-07-27 ENCOUNTER — Ambulatory Visit: Payer: MEDICAID

## 2024-07-27 ENCOUNTER — Encounter: Payer: Self-pay | Admitting: Licensed Clinical Social Worker

## 2024-07-27 ENCOUNTER — Inpatient Hospital Stay: Payer: MEDICAID

## 2024-07-27 DIAGNOSIS — Z510 Encounter for antineoplastic radiation therapy: Secondary | ICD-10-CM | POA: Diagnosis not present

## 2024-07-27 LAB — RAD ONC ARIA SESSION SUMMARY
Course Elapsed Days: 21
Plan Fractions Treated to Date: 8
Plan Prescribed Dose Per Fraction: 3 Gy
Plan Total Fractions Prescribed: 10
Plan Total Prescribed Dose: 30 Gy
Reference Point Dosage Given to Date: 24 Gy
Reference Point Session Dosage Given: 3 Gy
Session Number: 8

## 2024-07-28 ENCOUNTER — Other Ambulatory Visit: Payer: Self-pay

## 2024-07-28 ENCOUNTER — Inpatient Hospital Stay: Payer: MEDICAID

## 2024-07-28 ENCOUNTER — Telehealth: Payer: Self-pay

## 2024-07-28 ENCOUNTER — Ambulatory Visit: Payer: MEDICAID

## 2024-07-28 ENCOUNTER — Ambulatory Visit
Admission: RE | Admit: 2024-07-28 | Discharge: 2024-07-28 | Disposition: A | Discharge status: Home / Self Care | Payer: MEDICAID | Source: Ambulatory Visit | Attending: Radiation Oncology | Admitting: Radiation Oncology

## 2024-07-28 ENCOUNTER — Telehealth: Payer: Self-pay | Admitting: Radiation Oncology

## 2024-07-28 VITALS — BP 102/66 | HR 80 | Temp 97.7°F | Resp 18

## 2024-07-28 DIAGNOSIS — Z1731 Human epidermal growth factor receptor 2 positive status: Secondary | ICD-10-CM

## 2024-07-28 DIAGNOSIS — Z510 Encounter for antineoplastic radiation therapy: Secondary | ICD-10-CM | POA: Diagnosis not present

## 2024-07-28 LAB — RAD ONC ARIA SESSION SUMMARY
Course Elapsed Days: 22
Plan Fractions Treated to Date: 9
Plan Prescribed Dose Per Fraction: 3 Gy
Plan Total Fractions Prescribed: 10
Plan Total Prescribed Dose: 30 Gy
Reference Point Dosage Given to Date: 27 Gy
Reference Point Session Dosage Given: 3 Gy
Session Number: 9

## 2024-07-28 LAB — COMPREHENSIVE METABOLIC PANEL WITH GFR
ALT: 52 U/L — ABNORMAL HIGH (ref 0–44)
AST: 37 U/L (ref 15–41)
Albumin: 3.4 g/dL — ABNORMAL LOW (ref 3.5–5.0)
Alkaline Phosphatase: 251 U/L — ABNORMAL HIGH (ref 38–126)
Anion gap: 13 (ref 5–15)
BUN: 16 mg/dL (ref 6–20)
CO2: 24 mmol/L (ref 22–32)
Calcium: 8.1 mg/dL — ABNORMAL LOW (ref 8.9–10.3)
Chloride: 102 mmol/L (ref 98–111)
Creatinine, Ser: 1.25 mg/dL — ABNORMAL HIGH (ref 0.44–1.00)
GFR, Estimated: 53 mL/min — ABNORMAL LOW (ref >60)
Glucose, Bld: 83 mg/dL (ref 70–99)
Potassium: 4.1 mmol/L (ref 3.5–5.1)
Sodium: 138 mmol/L (ref 135–145)
Total Bilirubin: <0.2 mg/dL (ref 0.0–1.2)
Total Protein: 6.2 g/dL — ABNORMAL LOW (ref 6.5–8.1)

## 2024-07-28 LAB — CBC WITH DIFFERENTIAL/PLATELET
Abs Immature Granulocytes: 0.3 K/uL — ABNORMAL HIGH (ref 0.00–0.07)
Band Neutrophils: 2 %
Basophils Absolute: 0.1 K/uL (ref 0.0–0.1)
Basophils Relative: 1 %
Blasts: 1 %
Eosinophils Absolute: 0 K/uL (ref 0.0–0.5)
Eosinophils Relative: 0 %
HCT: 24 % — ABNORMAL LOW (ref 36.0–46.0)
Hemoglobin: 7.6 g/dL — ABNORMAL LOW (ref 12.0–15.0)
Lymphocytes Relative: 66 %
Lymphs Abs: 8.5 K/uL — ABNORMAL HIGH (ref 0.7–4.0)
MCH: 32.2 pg (ref 26.0–34.0)
MCHC: 31.7 g/dL (ref 30.0–36.0)
MCV: 101.7 fL — ABNORMAL HIGH (ref 80.0–100.0)
Metamyelocytes Relative: 1 %
Monocytes Absolute: 0 K/uL — ABNORMAL LOW (ref 0.1–1.0)
Monocytes Relative: 0 %
Myelocytes: 1 %
Neutro Abs: 3.9 K/uL (ref 1.7–7.7)
Neutrophils Relative %: 28 %
Platelets: 386 K/uL (ref 150–400)
RBC: 2.36 MIL/uL — ABNORMAL LOW (ref 3.87–5.11)
RDW: 13 % (ref 11.5–15.5)
Smear Review: NORMAL
WBC: 12.9 K/uL — ABNORMAL HIGH (ref 4.0–10.5)
nRBC: 0 % (ref 0.0–0.2)

## 2024-07-28 LAB — URINE DRUG SCREEN
Amphetamines: NEGATIVE
Barbiturates: NEGATIVE
Benzodiazepines: POSITIVE — AB
Cocaine: NEGATIVE
Fentanyl: NEGATIVE
Methadone Scn, Ur: NEGATIVE
Opiates: NEGATIVE
Tetrahydrocannabinol: POSITIVE — AB

## 2024-07-28 LAB — CBC
HCT: 24.6 % — ABNORMAL LOW (ref 36.0–46.0)
Hemoglobin: 7.8 g/dL — ABNORMAL LOW (ref 12.0–15.0)
MCH: 32.1 pg (ref 26.0–34.0)
MCHC: 31.7 g/dL (ref 30.0–36.0)
MCV: 101.2 fL — ABNORMAL HIGH (ref 80.0–100.0)
Platelets: 374 K/uL (ref 150–400)
RBC: 2.43 MIL/uL — ABNORMAL LOW (ref 3.87–5.11)
RDW: 13 % (ref 11.5–15.5)
WBC: 13.1 K/uL — ABNORMAL HIGH (ref 4.0–10.5)
nRBC: 0 % (ref 0.0–0.2)

## 2024-07-28 LAB — IRON AND TIBC
Iron: 57 ug/dL (ref 28–170)
Saturation Ratios: 18 % (ref 10.4–31.8)
TIBC: 314 ug/dL (ref 250–450)
UIBC: 256 ug/dL

## 2024-07-28 LAB — MAGNESIUM: Magnesium: 2.5 mg/dL — ABNORMAL HIGH (ref 1.7–2.4)

## 2024-07-28 LAB — FOLATE: Folate: >20 ng/mL (ref >5.9)

## 2024-07-28 LAB — PREPARE RBC (CROSSMATCH)

## 2024-07-28 LAB — FERRITIN: Ferritin: 616 ng/mL — ABNORMAL HIGH (ref 11–307)

## 2024-07-28 LAB — VITAMIN B12: Vitamin B-12: 2435 pg/mL — ABNORMAL HIGH (ref 180–914)

## 2024-07-28 MED ORDER — DEXTROSE 5 % IV SOLN: Freq: Once | INTRAVENOUS | Status: AC

## 2024-07-28 MED ORDER — DEXAMETHASONE SODIUM PHOSPHATE 10 MG/ML IJ SOLN
10.0000 mg | Freq: Once | INTRAMUSCULAR | Status: AC
  Administered 2024-07-28: 10 mg via INTRAVENOUS

## 2024-07-28 MED ADMIN — FOSAPREPITANT IV INFUSION 150 MG: 150 mg | INTRAVENOUS | NDC 72205005401

## 2024-07-28 MED ADMIN — Palonosetron HCl IV Soln 0.25 MG/5ML (Base Equivalent): 0.25 mg | INTRAVENOUS | NDC 60505619301

## 2024-07-28 MED ADMIN — Famotidine in NaCl 0.9% IV Soln 20 MG/50ML: 20 mg | INTRAVENOUS | NDC 00338519741

## 2024-07-28 MED ADMIN — FAM-TRASTUZUMAB-DERUXTECAN-NXKI CHEMO IV INFUSION: 340 mg | INTRAVENOUS | NDC 65597040601

## 2024-07-28 MED FILL — Fosaprepitant Dimeglumine For IV Infusion 150 MG (Base Eq): 150.0000 mg | INTRAVENOUS | Qty: 150 | Status: AC

## 2024-07-28 MED FILL — Fam-Trastuzumab Deruxtecan-nxki For IV Soln 100 MG: 5.4000 mg/kg | INTRAVENOUS | Qty: 17 | Status: AC

## 2024-07-28 MED FILL — Palonosetron HCl IV Soln 0.25 MG/5ML (Base Equivalent): 0.2500 mg | INTRAVENOUS | Qty: 5 | Status: AC

## 2024-07-28 MED FILL — Famotidine in NaCl 0.9% IV Soln 20 MG/50ML: 20.0000 mg | INTRAVENOUS | Qty: 50 | Status: AC

## 2024-07-28 NOTE — Progress Notes (Signed)
°   07/28/24 1100  Spiritual Encounters  Type of Visit Follow up  Care provided to: Patient  Referral source Chaplain assessment  Reason for visit Routine spiritual support  OnCall Visit No  Spiritual Needs Emotional  Spiritual Framework  Presenting Themes Goals in life/care;Caregiving needs;Courage hope and growth  Values/beliefs professes a Investment banker, operational; motivated by her daughters Madalyn and Welda, and her granddaughter Elora  Community/Connection Family   Reason for Visit: Chaplain making scheduled follow-up with Pt I previously connected with.  Description of Visit: I entered the room and found Sara [name] sitting in the chair receiving her treatment, with no one there as a support person.    Sara [name] and I spoke about her radiation/chemo treatments and she expressed the difficulty of it.  She is particularly bothered by the hot flashes she experiences, describing it as having one foot in hell.   Sara [name] continues to repeat phrases of trust in God and appears to be clinging to faith as her coping mechanism.   Sara [name] was much more confused and disoriented in her speech then I have seen her previously.  Multiple times she shifted her conversation in mid-thought, and lost her focus easily.  I spoke with RN as to whether this was the impact of Chemo  Plan of Care: I will continue to follow Sara [name] on a bi-weekly basis.    Sara [name], MDiv Chaplain, [facility] [name] [phone] 07/28/2024 11:57 AM

## 2024-07-28 NOTE — Progress Notes (Signed)
 Patient presents today for chemotherapy infusion.  Patient is in satisfactory condition with no new complaints voiced.  Vital signs are stable.  Labs reviewed and all other labs are within treatment parameters.  Patient's hemoglobin noted to be 7.8. Patient will receive 1 unit of blood tomorrow per provider's standing orders. We will proceed with treatment per MD orders.    Treatment given today per MD orders. Tolerated infusion without adverse affects. Vital signs stable. No complaints at this time. Discharged from clinic ambulatory in stable condition. Alert and oriented x 3. F/U with [facility] as scheduled.

## 2024-07-28 NOTE — Progress Notes (Signed)
 HGB 7.6. Patient agreed to blood products and consent obtained. Patient teaching performed pertaining to blood products and understanding verbalized. Patient requests blood products on Friday 07-29-2024 in the morning due to radiation appt in [place] that afternoon. Patient requesting pain medication refill.

## 2024-07-28 NOTE — Telephone Encounter (Signed)
 RN called patient to find out concerns/needs before appointment this afternoon.  Attempts unsuccessful will continue to reach out.

## 2024-07-28 NOTE — Telephone Encounter (Signed)
 12/11 Received voicemail from patient concerning her treatment appt.  Email forward to L3 machine and copied Support RTT/Nursing, so they are aware.

## 2024-07-28 NOTE — Telephone Encounter (Signed)
 RN tried to reach patient again before appointment this afternoon unsuccessful left another message to return my call.

## 2024-07-29 ENCOUNTER — Ambulatory Visit: Admission: RE | Admit: 2024-07-29 | Payer: MEDICAID | Source: Ambulatory Visit

## 2024-07-29 ENCOUNTER — Ambulatory Visit: Payer: MEDICAID

## 2024-07-29 ENCOUNTER — Inpatient Hospital Stay: Payer: MEDICAID

## 2024-07-29 ENCOUNTER — Other Ambulatory Visit (HOSPITAL_COMMUNITY): Payer: Self-pay | Admitting: *Deleted

## 2024-07-29 LAB — CANCER ANTIGEN 15-3: CA 15-3: 34.1 U/mL — ABNORMAL HIGH (ref 0.0–25.0)

## 2024-07-29 LAB — CANCER ANTIGEN 27.29: CA 27.29: 39.3 U/mL — ABNORMAL HIGH (ref 0.0–38.6)

## 2024-08-01 ENCOUNTER — Inpatient Hospital Stay: Payer: MEDICAID

## 2024-08-01 ENCOUNTER — Other Ambulatory Visit: Payer: Self-pay

## 2024-08-01 ENCOUNTER — Ambulatory Visit: Payer: MEDICAID

## 2024-08-01 LAB — TYPE AND SCREEN
ABO/RH(D): A POS
Antibody Screen: NEGATIVE
Unit division: 0

## 2024-08-01 LAB — BPAM RBC
Blood Product Expiration Date: 202512222359
Unit Type and Rh: 6200

## 2024-08-01 MED ORDER — OXYCODONE HCL 5 MG PO TABS: 10.0000 mg | ORAL_TABLET | Freq: Four times a day (QID) | ORAL | 0 refills | Status: AC | PRN

## 2024-08-02 ENCOUNTER — Ambulatory Visit: Payer: MEDICAID

## 2024-08-02 ENCOUNTER — Inpatient Hospital Stay: Payer: MEDICAID

## 2024-08-02 ENCOUNTER — Telehealth: Payer: Self-pay

## 2024-08-02 ENCOUNTER — Encounter: Payer: Self-pay | Admitting: Oncology

## 2024-08-02 NOTE — Telephone Encounter (Signed)
 RN called patient to discuss her appointment this afternoon and if she would be coming in to finish last treatment.  No answer left message to return call.

## 2024-08-03 ENCOUNTER — Ambulatory Visit: Payer: MEDICAID

## 2024-08-04 ENCOUNTER — Inpatient Hospital Stay: Payer: MEDICAID

## 2024-08-04 ENCOUNTER — Telehealth: Payer: Self-pay

## 2024-08-04 ENCOUNTER — Ambulatory Visit: Payer: MEDICAID

## 2024-08-04 VITALS — BP 117/75 | HR 112 | Temp 98.7°F | Resp 20

## 2024-08-04 DIAGNOSIS — Z510 Encounter for antineoplastic radiation therapy: Secondary | ICD-10-CM | POA: Diagnosis not present

## 2024-08-04 DIAGNOSIS — D649 Anemia, unspecified: Secondary | ICD-10-CM

## 2024-08-04 LAB — CBC
HCT: 28.2 % — ABNORMAL LOW (ref 36.0–46.0)
Hemoglobin: 9.3 g/dL — ABNORMAL LOW (ref 12.0–15.0)
MCH: 32.2 pg (ref 26.0–34.0)
MCHC: 33 g/dL (ref 30.0–36.0)
MCV: 97.6 fL (ref 80.0–100.0)
Platelets: 155 K/uL (ref 150–400)
RBC: 2.89 MIL/uL — ABNORMAL LOW (ref 3.87–5.11)
RDW: 12.5 % (ref 11.5–15.5)
WBC: 8 K/uL (ref 4.0–10.5)
nRBC: 0 % (ref 0.0–0.2)

## 2024-08-04 LAB — SAMPLE TO BLOOD BANK

## 2024-08-04 NOTE — Progress Notes (Signed)
 Per standing orders and charge nurse patient does not need blood for Hgb of 9.3.

## 2024-08-04 NOTE — Telephone Encounter (Signed)
 RN called patient to see if she would be coming for her 2:00 pm appointment this afternoon no answer again, voicemail was left to return call.  Informed radiation team of attempts.

## 2024-08-05 ENCOUNTER — Ambulatory Visit: Payer: MEDICAID

## 2024-08-10 ENCOUNTER — Ambulatory Visit (HOSPITAL_COMMUNITY): Admission: RE | Admit: 2024-08-10 | Payer: MEDICAID | Source: Ambulatory Visit

## 2024-08-10 ENCOUNTER — Other Ambulatory Visit: Payer: Self-pay | Admitting: Oncology

## 2024-08-10 ENCOUNTER — Encounter: Payer: Self-pay | Admitting: Oncology

## 2024-08-10 DIAGNOSIS — C50911 Malignant neoplasm of unspecified site of right female breast: Secondary | ICD-10-CM

## 2024-08-10 NOTE — Radiation Completion Notes (Addendum)
" °  Radiation Oncology         [phone] ________________________________  Name: Sara [name] MRN: [id number]  Date: 07/29/2024  DOB: July 03, 1976  Referring Physician: YANCEY RENO, M.D. Date of Service: 2024-08-10 Radiation Oncologist: Sara [name], M.D. 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   Intent: Palliative     ==========DELIVERED PLANS==========  First Treatment Date: 2024-07-04 Last Treatment Date: 2024-07-28   Plan Name: Brain_Whole Site: Brain Technique: 3D Mode: Photon Dose Per Fraction: 3 Gy Prescribed Dose (Delivered / Prescribed): 27 Gy / 30 Gy Prescribed Fxs (Delivered / Prescribed): 9 / 10     ==========ON TREATMENT VISIT DATES========== 2024-07-08, 2024-07-22   See weekly On Treatment Notes in Epic for details in the Media tab (listed as Progress notes on the On Treatment Visit Dates listed above).  She tolerated the treatments relatively well with only modest fatigue but she did not complete her last treatment due to social issues.  The patient will receive a call in about one month from the radiation oncology department and we will coordinate for a posttreatment MRI brain scan in 3 months with a telephone follow-up visit thereafter to review results. She will continue follow up with her medical oncologist, Dr. Davonna, as well.  ------------------------------------------------   Sara Barge, MD [facility]  Radiation Oncology Direct Dial: [phone]  Fax: [phone] [email or web address]  Skype  LinkedIn    "

## 2024-08-15 ENCOUNTER — Encounter: Payer: Self-pay | Admitting: *Deleted

## 2024-08-15 ENCOUNTER — Other Ambulatory Visit (HOSPITAL_COMMUNITY): Payer: MEDICAID

## 2024-08-15 ENCOUNTER — Ambulatory Visit (HOSPITAL_COMMUNITY)
Admission: RE | Admit: 2024-08-15 | Discharge: 2024-08-15 | Disposition: A | Discharge status: Home / Self Care | Payer: MEDICAID | Source: Ambulatory Visit | Attending: Oncology | Admitting: Oncology

## 2024-08-15 DIAGNOSIS — C50911 Malignant neoplasm of unspecified site of right female breast: Secondary | ICD-10-CM | POA: Insufficient documentation

## 2024-08-15 DIAGNOSIS — Z1731 Human epidermal growth factor receptor 2 positive status: Secondary | ICD-10-CM | POA: Insufficient documentation

## 2024-08-15 MED ORDER — IOHEXOL 300 MG/ML  SOLN
80.0000 mL | Freq: Once | INTRAMUSCULAR | Status: AC | PRN
  Administered 2024-08-15: 80 mL via INTRAVENOUS

## 2024-08-16 ENCOUNTER — Other Ambulatory Visit: Payer: Self-pay

## 2024-08-17 ENCOUNTER — Inpatient Hospital Stay: Payer: MEDICAID

## 2024-08-17 ENCOUNTER — Other Ambulatory Visit: Payer: Self-pay | Admitting: Oncology

## 2024-08-17 VITALS — BP 134/84 | HR 103 | Resp 20 | Wt 130.1 lb

## 2024-08-17 VITALS — BP 118/74 | HR 84 | Temp 98.6°F | Resp 18

## 2024-08-17 DIAGNOSIS — Z510 Encounter for antineoplastic radiation therapy: Secondary | ICD-10-CM | POA: Diagnosis not present

## 2024-08-17 DIAGNOSIS — D649 Anemia, unspecified: Secondary | ICD-10-CM

## 2024-08-17 DIAGNOSIS — G893 Neoplasm related pain (acute) (chronic): Secondary | ICD-10-CM

## 2024-08-17 DIAGNOSIS — F419 Anxiety disorder, unspecified: Secondary | ICD-10-CM

## 2024-08-17 DIAGNOSIS — C50911 Malignant neoplasm of unspecified site of right female breast: Secondary | ICD-10-CM

## 2024-08-17 DIAGNOSIS — F1920 Other psychoactive substance dependence, uncomplicated: Secondary | ICD-10-CM

## 2024-08-17 LAB — CBC WITH DIFFERENTIAL/PLATELET
Abs Immature Granulocytes: 0.01 K/uL (ref 0.00–0.07)
Basophils Absolute: 0 K/uL (ref 0.0–0.1)
Basophils Relative: 1 %
Eosinophils Absolute: 0.1 K/uL (ref 0.0–0.5)
Eosinophils Relative: 2 %
HCT: 29.2 % — ABNORMAL LOW (ref 36.0–46.0)
Hemoglobin: 9.2 g/dL — ABNORMAL LOW (ref 12.0–15.0)
Immature Granulocytes: 0 %
Lymphocytes Relative: 27 %
Lymphs Abs: 1.6 K/uL (ref 0.7–4.0)
MCH: 32.4 pg (ref 26.0–34.0)
MCHC: 31.5 g/dL (ref 30.0–36.0)
MCV: 102.8 fL — ABNORMAL HIGH (ref 80.0–100.0)
Monocytes Absolute: 1.2 K/uL — ABNORMAL HIGH (ref 0.1–1.0)
Monocytes Relative: 19 %
Neutro Abs: 3.3 K/uL (ref 1.7–7.7)
Neutrophils Relative %: 51 %
Platelets: 261 K/uL (ref 150–400)
RBC: 2.84 MIL/uL — ABNORMAL LOW (ref 3.87–5.11)
RDW: 16.2 % — ABNORMAL HIGH (ref 11.5–15.5)
WBC: 6.2 K/uL (ref 4.0–10.5)
nRBC: 0 % (ref 0.0–0.2)

## 2024-08-17 LAB — COMPREHENSIVE METABOLIC PANEL WITH GFR
ALT: 43 U/L (ref 0–44)
AST: 58 U/L — ABNORMAL HIGH (ref 15–41)
Albumin: 4 g/dL (ref 3.5–5.0)
Alkaline Phosphatase: 261 U/L — ABNORMAL HIGH (ref 38–126)
Anion gap: 13 (ref 5–15)
BUN: 11 mg/dL (ref 6–20)
CO2: 26 mmol/L (ref 22–32)
Calcium: 9.3 mg/dL (ref 8.9–10.3)
Chloride: 103 mmol/L (ref 98–111)
Creatinine, Ser: 0.72 mg/dL (ref 0.44–1.00)
GFR, Estimated: >60 mL/min
Glucose, Bld: 116 mg/dL — ABNORMAL HIGH (ref 70–99)
Potassium: 3.5 mmol/L (ref 3.5–5.1)
Sodium: 141 mmol/L (ref 135–145)
Total Bilirubin: 0.3 mg/dL (ref 0.0–1.2)
Total Protein: 7 g/dL (ref 6.5–8.1)

## 2024-08-17 LAB — URINE DRUG SCREEN
Amphetamines: NEGATIVE
Barbiturates: NEGATIVE
Benzodiazepines: POSITIVE — AB
Cocaine: NEGATIVE
Fentanyl: NEGATIVE
Methadone Scn, Ur: NEGATIVE
Opiates: NEGATIVE
Tetrahydrocannabinol: NEGATIVE

## 2024-08-17 LAB — SAMPLE TO BLOOD BANK

## 2024-08-17 LAB — MAGNESIUM: Magnesium: 2.3 mg/dL (ref 1.7–2.4)

## 2024-08-17 MED ORDER — PALONOSETRON HCL INJECTION 0.25 MG/5ML
0.2500 mg | Freq: Once | INTRAVENOUS | Status: AC
  Administered 2024-08-17: 0.25 mg via INTRAVENOUS
  Filled 2024-08-17: qty 5

## 2024-08-17 MED ORDER — ONDANSETRON HCL 8 MG PO TABS: ORAL_TABLET | ORAL | 5 refills | Status: AC

## 2024-08-17 MED ORDER — FAM-TRASTUZUMAB DERUXTECAN-NXKI CHEMO 100 MG IV SOLR
5.4000 mg/kg | Freq: Once | INTRAVENOUS | Status: AC
  Administered 2024-08-17: 340 mg via INTRAVENOUS
  Filled 2024-08-17: qty 17

## 2024-08-17 MED ORDER — FAMOTIDINE IN NACL 20-0.9 MG/50ML-% IV SOLN
20.0000 mg | Freq: Once | INTRAVENOUS | Status: AC
  Administered 2024-08-17: 20 mg via INTRAVENOUS
  Filled 2024-08-17: qty 50

## 2024-08-17 MED ORDER — SODIUM CHLORIDE 0.9 % IV SOLN
150.0000 mg | Freq: Once | INTRAVENOUS | Status: AC
  Administered 2024-08-17: 150 mg via INTRAVENOUS
  Filled 2024-08-17: qty 150

## 2024-08-17 MED ORDER — DEXAMETHASONE SODIUM PHOSPHATE 10 MG/ML IJ SOLN
10.0000 mg | Freq: Once | INTRAMUSCULAR | Status: AC
  Administered 2024-08-17: 10 mg via INTRAVENOUS

## 2024-08-17 MED ORDER — MIRTAZAPINE 15 MG PO TABS: 15.0000 mg | ORAL_TABLET | Freq: Every day | ORAL | 2 refills | Status: DC

## 2024-08-17 MED ORDER — DEXTROSE 5 % IV SOLN: Freq: Once | INTRAVENOUS | Status: AC

## 2024-08-17 MED ORDER — ALPRAZOLAM 0.5 MG PO TABS: 0.5000 mg | ORAL_TABLET | Freq: Three times a day (TID) | ORAL | 0 refills | Status: DC | PRN

## 2024-08-17 NOTE — Patient Instructions (Signed)
 [facility] [place] [facility]  Discharge Instructions: Thank you for choosing [facility] to provide your oncology and hematology care.  If you have a lab appointment with the Cancer Center - please note that after April 8th, 2024, all labs will be drawn in the cancer center.  You do not have to check in or register with the main entrance as you have in the past but will complete your check-in in the cancer center.  Wear comfortable clothing and clothing appropriate for easy access to any Portacath or PICC line.   We strive to give you quality time with your provider. You may need to reschedule your appointment if you arrive late (15 or more minutes).  Arriving late affects you and other patients whose appointments are after yours.  Also, if you miss three or more appointments without notifying the office, you may be dismissed from the clinic at the provider's discretion.      For prescription refill requests, have your pharmacy contact our office and allow 72 hours for refills to be completed.    Today you received the following chemotherapy and/or immunotherapy agents Enhertu   To help prevent nausea and vomiting after your treatment, we encourage you to take your nausea medication as directed.  BELOW ARE SYMPTOMS THAT SHOULD BE REPORTED IMMEDIATELY: *FEVER GREATER THAN 100.4 F (38 C) OR HIGHER *CHILLS OR SWEATING *NAUSEA AND VOMITING THAT IS NOT CONTROLLED WITH YOUR NAUSEA MEDICATION *UNUSUAL SHORTNESS OF BREATH *UNUSUAL BRUISING OR BLEEDING *URINARY PROBLEMS (pain or burning when urinating, or frequent urination) *BOWEL PROBLEMS (unusual diarrhea, constipation, pain near the anus) TENDERNESS IN MOUTH AND THROAT WITH OR WITHOUT PRESENCE OF ULCERS (sore throat, sores in mouth, or a toothache) UNUSUAL RASH, SWELLING OR PAIN  UNUSUAL VAGINAL DISCHARGE OR ITCHING   Items with * indicate a potential emergency and should be followed up as  soon as possible or go to the Emergency Department if any problems should occur.  Please show the CHEMOTHERAPY ALERT CARD or IMMUNOTHERAPY ALERT CARD at check-in to the Emergency Department and triage nurse.  Should you have questions after your visit or need to cancel or reschedule your appointment, please contact [facility] [place] - A DEPT OF [facility] [phone]  and follow the prompts.  Office hours are 8:00 a.m. to 4:30 p.m. Monday - Friday. Please note that voicemails left after 4:00 p.m. may not be returned until the following business day.  We are closed weekends and major holidays. You have access to a nurse at all times for urgent questions. Please call the main number to the clinic [phone] and follow the prompts.  For any non-urgent questions, you may also contact your provider using MyChart. We now offer e-Visits for anyone 41 and older to request care online for non-urgent symptoms. For details visit [email or web address].   Also download the MyChart app! Go to the app store, search "MyChart", open the app, select , and log in with your MyChart username and password.

## 2024-08-17 NOTE — Progress Notes (Signed)
 Patient requested refills for Xanax , Remeron  and Zofran .  Check PDMP for Xanax  refill.  Acceptable for refill.  Delon Hope, AGNP-C Department of Hematology/Oncology [facility]  Phone: [phone]  08/17/2024 2:53 PM

## 2024-08-17 NOTE — Progress Notes (Signed)
 Patient tolerated chemotherapy with no complaints voiced.  Side effects with management reviewed with understanding verbalized.  Port site clean and dry with no bruising or swelling noted at site.  Good blood return noted before and after administration of chemotherapy.  Band aid applied.  Patient left in satisfactory condition with VSS and no s/s of distress noted. All follow ups as scheduled.   Venkat Ankney [employer]

## 2024-08-17 NOTE — Progress Notes (Signed)
 OK to proceed ECHO from 02/08/24 - 60-65%  Patient to reschedule ECHO and CT prior to next OV with Dr Davonna 09/08/24.  T.OSABRA Delon Hope, NP/Riaan [place] Joshua, PharmD

## 2024-08-18 LAB — CANCER ANTIGEN 27.29: CA 27.29: 28.4 U/mL (ref 0.0–38.6)

## 2024-08-18 LAB — CANCER ANTIGEN 15-3: CA 15-3: 32.2 U/mL — ABNORMAL HIGH (ref 0.0–25.0)

## 2024-08-19 ENCOUNTER — Ambulatory Visit (HOSPITAL_COMMUNITY)
Admission: RE | Admit: 2024-08-19 | Discharge: 2024-08-19 | Disposition: A | Payer: MEDICAID | Source: Ambulatory Visit | Attending: Oncology

## 2024-08-19 ENCOUNTER — Other Ambulatory Visit: Payer: Self-pay

## 2024-08-19 DIAGNOSIS — Z0189 Encounter for other specified special examinations: Secondary | ICD-10-CM | POA: Diagnosis not present

## 2024-08-19 DIAGNOSIS — Z1731 Human epidermal growth factor receptor 2 positive status: Secondary | ICD-10-CM | POA: Diagnosis present

## 2024-08-19 DIAGNOSIS — C50911 Malignant neoplasm of unspecified site of right female breast: Secondary | ICD-10-CM | POA: Insufficient documentation

## 2024-08-19 LAB — ECHOCARDIOGRAM COMPLETE
Area-P 1/2: 4.06 cm2
Calc EF: 58.5 %
MV M vel: 5.5 m/s
MV Peak grad: 121 mmHg
S' Lateral: 3.4 cm
Single Plane A2C EF: 59.5 %
Single Plane A4C EF: 56.8 %

## 2024-08-19 MED ORDER — MIRTAZAPINE 15 MG PO TABS: 15.0000 mg | ORAL_TABLET | Freq: Every day | ORAL | 2 refills | Status: AC

## 2024-08-19 NOTE — Progress Notes (Signed)
*  PRELIMINARY RESULTS* Echocardiogram 2D Echocardiogram has been performed.  Teresa Aida PARAS 08/19/2024, 12:20 PM

## 2024-08-26 NOTE — Progress Notes (Signed)
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  The patient {Desc; did/not:3044021} note fatigue during radiation. The patient {Desc; did/not:3044021} note hair loss or skin changes in the field of radiation during therapy. The patient {ACTION; IS/IS WNU:[id number]} taking dexamethasone . The patient {DOES_DOES WNU:81435} have symptoms of  weakness or loss of control of the extremities. The patient {DOES_DOES WNU:81435} have symptoms of headache. The patient {DOES_DOES WNU:81435} have symptoms of seizure or uncontrolled movement. The patient {DOES_DOES WNU:81435} have symptoms of changes in vision. The patient {DOES_DOES WNU:81435} have changes in speech. The patient {DOES_DOES WNU:81435} have confusion.   The patient was counseled that she will be contacted by our brain and spine navigator to schedule surveillance imaging. The patient was encouraged to call if she have not received a call to schedule imaging, or if shedevelops concerns or questions regarding radiation. The patient will also continue to follow up with Dr. Davonna in medical oncology.  "

## 2024-08-30 ENCOUNTER — Ambulatory Visit
Admission: RE | Admit: 2024-08-30 | Discharge: 2024-08-30 | Disposition: A | Discharge status: Home / Self Care | Payer: MEDICAID | Source: Ambulatory Visit | Attending: Radiation Oncology | Admitting: Radiation Oncology

## 2024-08-30 DIAGNOSIS — C50911 Malignant neoplasm of unspecified site of right female breast: Secondary | ICD-10-CM

## 2024-09-02 ENCOUNTER — Other Ambulatory Visit: Payer: Self-pay | Admitting: Urology

## 2024-09-02 ENCOUNTER — Other Ambulatory Visit: Payer: Self-pay

## 2024-09-02 DIAGNOSIS — C7931 Secondary malignant neoplasm of brain: Secondary | ICD-10-CM

## 2024-09-02 MED ORDER — OXYCODONE HCL 5 MG PO TABS: 10.0000 mg | ORAL_TABLET | Freq: Four times a day (QID) | ORAL | 0 refills | Status: DC | PRN

## 2024-09-03 ENCOUNTER — Other Ambulatory Visit: Payer: Self-pay

## 2024-09-07 NOTE — Progress Notes (Signed)
 " Patient Care Team: Orpha Yancey LABOR, MD as PCP - General (Internal Medicine) Celestia Joesph SQUIBB, RN as Oncology Nurse Navigator (Oncology)  Clinic Day:  09/08/2024  Referring physician: Orpha Yancey LABOR, MD   CHIEF COMPLAINT:  CC: HER2 positive metastatic breast carcinoma    ASSESSMENT & PLAN:   Assessment & Plan: Sara [name]  is a 49 y.o. 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  Rest of the metastatic areas in liver and bone had significant treatment response.   - We reviewed the CT scan findings together.  Patient has no evidence of recurrent or metastatic disease at this time. -Labs reviewed today: Creatinine: Normal, slightly elevated LFTs, CBC: Hemoglobin: 10.3, WBC and platelets are normal. -CA 15-3: 32.2, CA 27.29: 28.4 -Continue current treatment with Enhertu .  Tolerating well.  Cycle 22-day 1 today.   -Physical exam stable today.  Will proceed with treatment today. -Continue CT scan every 3 months.  Next due 10/2023.   -Echo from 08/2024 normal.  Repeat in 6 months.  Due 02/2025 - Recommended dental clearance to start bone modifying agents.  Patient is planning to see a dentist soon.  Return to clinic in 6 weeks for follow-up  Breast cancer metastasized to brain 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  Had multiple brain lesions at that time. Had 2 courses of SRS one in September 2024 and one in July 2025 Recent MRI with multiple brain lesions.   Completed whole brain radiation with Dr. Patrcia  - Repeat MRI as scheduled by radiation oncology in  March -Will continue to obtain frequent MRIs every 3 to 6 months   Chronic pain Change Percocet 7.5 to oxycodone  10 mg every 6 hours for elevated liver enzymes.   -Continue oxycodone  10 mg every 6 hours as needed for pain   Nausea and vomiting -Continue Zofran  8 mg as needed every 8 hours.   Constipation Likely related to chemotherapy and use of anti-diarrheal medications.   Hypertension Blood pressure well-controlled with amlodipine .   - Continue amlodipine  5 mg daily.   Anxiety Taking Xanax  0.5 mg daily as needed.  Patient reports insufficient dosing.   - Continue Xanax  0.5 mg 3 times daily as needed   Elevated liver enzymes Cause unclear, not related to current prescribed medications.  Unsure about over-the-counter medications/drug use. Patient previously had an episode of elevated liver enzymes that etiology was unknown and resolved by itself. Ultrasound did not show any concerning areas  -Slightly elevated again.  Will continue to monitor -Educated on over-the-counter Tylenol  use   Macrocytic anemia Hemoglobin level at 10.3.  Likely secondary to chronic disease-malignancy Anemia panel within normal limits   - Continue to monitor hemoglobin levels.  The patient understands the plans discussed today and is in agreement with them.  She knows to contact our office if she develops concerns prior to her next appointment.  The total time spent in the appointment was 20 minutes for the encounter with patient, including review of chart and various tests results, discussions about plan of care and coordination of care plan    I,Sara [name],acting as a scribe for [employer],  MD.,have documented all relevant documentation on the behalf of Sara Dry, MD,as directed by  Sara Dry, MD while in the presence of Sara Dry, MD.  I, Sara Dry MD, have reviewed the above documentation for accuracy and completeness, and I agree with the above.    Sara Dry, MD  [facility] [place] - [facility] DE[facility] [address] [place] [address] Dept: [phone] Dept Fax: [phone]   Orders Placed This Encounter  Procedures   CT CHEST ABDOMEN PELVIS W CONTRAST    Standing Status:   Future    Expected Date:   11/03/2024    Expiration Date:   09/08/2025    If indicated for the ordered procedure, I authorize the administration of contrast media per Radiology protocol:   Yes    Does the patient have a contrast media/X-ray dye allergy?:   No    Preferred imaging location?:   [facility]    If indicated for the ordered procedure, I authorize the administration of oral contrast media per Radiology protocol:   Yes     ONCOLOGY HISTORY:   Diagnosis: HER2 positive metastatic breast carcinoma   -Presentation: Palpable knot in right breast in July 2022 which noticeably increased in size by November 2022 -10/01/2021: Bilateral Diagnostic mammogram and US : Large palpable right breast mass involving the entire central right breast measuring at least 5 cm with associated nipple flattening and overlying skin thickening and generalized erythema and marked tenderness to palpation. Findings are concerning for inflammatory breast cancer. At least 3 lymph nodes with borderline cortical thickening in the right axilla. -10/08/2021: Right breast mass and axillary lymph node biopsy.  Pathology: Poorly differentiated ductal carcinoma. The tumor cells are Her2 (3+) positive, ER positive (5%), and PR negative.  Ki-67 is 25%. -11/30/2021: Initial PET: Extensive hypermetabolic tumor involving the right breast. There is also focal invasion of the lower pectoralis muscle. Metastatic mediastinal and hilar lymphadenopathy and hepatic metastatic disease. Diffuse lytic destructive metastatic bone disease. Cortical lesion involving the left femoral neck with definite risk for pathologic fracture. Multiple rib  and spine lesions but no canal compromise is demonstrated. -12/03/2021-02/25/2022: 4 cycles of THP -11/10/2022: CT CAP: Significantly improved disease in right breast, lymphadenopathy, lytic bone lesions. -12/24/2021: CA 15-3: 24.4. CA 27.29: 24.5 -11/11/2022: MRI Brain: 3.8 cm right cerebellar metastasis with vasogenic edema and prominent mass effect in the posterior fossa. There is downward descent of the tonsils and fourth ventricular obstruction causing hydrocephalus. Second 13 mm metastasis in the right parietal lobe. -11/12/2022: Cerebellar mass resection.  Pathology: Metastatic carcinoma consistent with breast primary. The tumor cells are Positive for Her2 (3+).  -12/03/2022: MRI Brain: Mild interval increase in size of a right parietal enhancing lesion, now measuring approximately 12 x 13 mm (previously 11 x 10 mm). Mild increase in surrounding edema. New punctate focus a of enhancement in the right caudate, concerning for new metastasis. Prior right cerebellar metastatic lesion resection without evidence of residual lesion at this site.  -12/05/2022-12/19/2022: 20Gy in single fraction to her two intact metastases in the right parietal lobe and flax cerebri -01/01/2023-current: Enhertu  5.4mg /kg every 3 weeks -05/13/2023: Stereotactic radiosurgery (Brain SRS) -2024-current: Disease remains stable in chest, abdomen, and pelvis per imaging.  -09/14/2023: MRI Brain: Stable appearance of brain metastasis.  -02/02/2024: MRI Brain: There are multiple new small cerebral metastases present. Otherwise stable brain metastasis.  -02/18/2024-02/26/2024: SRS to brain lesions -06/09/2024: MRI brain: 10  brain metastases which are either new or in retrospect larger from 02/02/2024. Slight interval enlargement of clustered nodular enhancing lesions in the right occipital lobe.Other small brain metastases are unchanged.  -07/06/2024 - 07/28/2024: whole brain radiation -08/15/2024: CT CAP: No evidence of  metastatic/recurrent disease  Current Treatment:  Enhertu  5.4mg /kg every 3 weeks  INTERVAL HISTORY:   Sara [name] is here today for follow-up of metastatic right breast cancer. She is unaccompanied today.   She states she has not tolerated brain radiation well and has lost 10 pounds unintentionally. Thy has also not been sleeping well.   She reports right-sided chest pain that feels like indigestion and similar to an elephant sitting on her chest. She reportedly has been examined for this, and symptoms were thought to be due to panic attacks. Kaleeyah is taking Remeron  and alprazolam  for depression and anxiety.   Her liver enzymes are elevated today. She is taking oxycodone , at least 2 pills of tylenol  daily, aleve , and BC for pain which may be contributing to her elevated liver enzymes.   I have reviewed the past medical history, past surgical history, social history and family history with the patient and they are unchanged from previous note.  ALLERGIES:  is allergic to hydrocodone , sulfa antibiotics, and morphine  and codeine.  MEDICATIONS:  Current Outpatient Medications  Medication Sig Dispense Refill   ALPRAZolam  (XANAX ) 0.5 MG tablet Take 1 tablet (0.5 mg total) by mouth 3 (three) times daily as needed for anxiety. 90 tablet 0   aluminum -magnesium  hydroxide 200-200 MG/5ML suspension Take 15 mLs by mouth every 6 (six) hours as needed for indigestion (Mix with 15 ml of lidocaine  2% and swish and spit). 355 mL 2   amLODipine  (NORVASC ) 5 MG tablet Take 1 tablet (5 mg total) by mouth daily. 30 tablet 2   docusate sodium  (COLACE) 100 MG capsule Take 1 capsule (100 mg total) by mouth 2 (two) times daily.     furosemide  (LASIX ) 20 MG tablet Take 1 tablet (20 mg total) by mouth daily as needed. 30 tablet 0   lidocaine  (XYLOCAINE ) 2 % solution      lidocaine -prilocaine  (EMLA ) cream Apply 1 Application topically as needed. 30 g 0   magic mouthwash w/lidocaine  SOLN Take 5 mLs by mouth 4  (four) times daily as needed for mouth pain. Suspension contains equal amounts of Maalox Extra Strength, nystatin, diphenhydramine  and lidocaine . 250 mL 2   mirtazapine  (REMERON ) 15 MG tablet Take 1 tablet (15 mg total) by mouth at bedtime. 30 tablet 2   ondansetron  (ZOFRAN ) 8 MG tablet TAKE 1 TABLET BY MOUTH EVERY 8 HOURS AS NEEDED FOR NAUSEA FOR VOMITING 90 tablet 5   oxyCODONE  (OXY IR/ROXICODONE ) 5 MG immediate release tablet Take 2 tablets (10 mg total) by mouth every 6 (six) hours as needed. 84 tablet 0   pantoprazole  (PROTONIX ) 20 MG tablet Take 1 tablet (20 mg total) by mouth daily. 30 tablet 5   prochlorperazine  (COMPAZINE ) 10 MG tablet Take 1 tablet (10 mg total) by mouth every 6 (six) hours as needed for nausea or vomiting. 30 tablet 5   Turmeric (QC TUMERIC COMPLEX PO) Take 1 Dose by mouth daily.     No current facility-administered medications for this visit.   Facility-Administered Medications Ordered in Other Visits  Medication Dose Route Frequency Provider Last Rate Last Admin   fam-trastuzumab deruxtecan-nxki  (ENHERTU ) 340 mg in dextrose  5 % 100 mL chemo infusion  5.4 mg/kg (Order-Specific) Intravenous Once Lillith Mcneff, MD 234 mL/hr  at 09/08/24 1527 340 mg at 09/08/24 1527     VITALS:  There were no vitals taken for this visit.  Wt Readings from Last 3 Encounters:  09/08/24 131 lb 3.2 oz (59.5 kg)  08/17/24 130 lb 1.1 oz (59 kg)  07/07/24 141 lb 8.6 oz (64.2 kg)    There is no height or weight on file to calculate BMI.  Performance status (ECOG): 0 - Asymptomatic  PHYSICAL EXAM:   GENERAL:alert, no distress and comfortable SKIN: skin color, texture, turgor are normal, no rashes or significant lesions LYMPH:  no palpable lymphadenopathy in the cervical, axillary or inguinal LUNGS: clear to auscultation and percussion with normal breathing effort HEART: regular rate & rhythm and no murmurs and no lower extremity edema ABDOMEN:abdomen soft, non-tender and normal  bowel sounds Musculoskeletal:no cyanosis of digits and no clubbing  NEURO: alert & oriented x 3 with fluent speech  LABORATORY DATA:  I have reviewed the data as listed     Component Value Date/Time   NA 140 09/08/2024 1220   K 4.0 09/08/2024 1220   CL 101 09/08/2024 1220   CO2 24 09/08/2024 1220   GLUCOSE 90 09/08/2024 1220   BUN 17 09/08/2024 1220   CREATININE 0.87 09/08/2024 1220   CALCIUM 9.7 09/08/2024 1220   PROT 7.4 09/08/2024 1220   ALBUMIN  4.1 09/08/2024 1220   AST 71 (H) 09/08/2024 1220   ALT 49 (H) 09/08/2024 1220   ALKPHOS 145 (H) 09/08/2024 1220   BILITOT 0.3 09/08/2024 1220   GFRNONAA >60 09/08/2024 1220   GFRAA >60 10/25/2019 1224     Lab Results  Component Value Date   WBC 7.5 09/08/2024   NEUTROABS 4.7 09/08/2024   HGB 10.3 (L) 09/08/2024   HCT 32.1 (L) 09/08/2024   MCV 104.2 (H) 09/08/2024   PLT 276 09/08/2024     Latest Reference Range & Units 07/28/24 08:49  Iron 28 - 170 ug/dL 57  UIBC ug/dL 743  TIBC 749 - 549 ug/dL 685  Saturation Ratios 10.4 - 31.8 % 18  Ferritin 11 - 307 ng/mL 616 (H)  Folate >5.9 ng/mL >20.0  Vitamin B12 180 - 914 pg/mL 2,435 (H)  (H): Data is abnormally high   Latest Reference Range & Units 08/17/24 10:36  CA 15-3 0.0 - 25.0 U/mL 32.2 (H)  CA 27.29 0.0 - 38.6 U/mL 28.4  (H): Data is abnormally high   Latest Reference Range & Units 07/07/24 08:47  Methadone Scn, Ur NEGATIVE  NEGATIVE  Amphetamines NEGATIVE  NEGATIVE  Barbiturates NEGATIVE  NEGATIVE  Benzodiazepines NEGATIVE  POSITIVE !  Opiates NEGATIVE  NEGATIVE  COCAINE NEGATIVE  NEGATIVE  Tetrahydrocannabinol NEGATIVE  POSITIVE !  !: Data is abnormal RADIOGRAPHIC STUDIES: I have personally reviewed the radiological images as listed and agreed with the findings in the report.  CT CHEST ABDOMEN PELVIS W CONTRAST CLINICAL DATA:  Invasive stage IV breast cancer, assess treatment response. * Tracking Code: BO *  EXAM: CT CHEST, ABDOMEN, AND PELVIS WITH  CONTRAST  TECHNIQUE: Multidetector CT imaging of the chest, abdomen and pelvis was performed following the standard protocol during bolus administration of intravenous contrast.  RADIATION DOSE REDUCTION: This exam was performed according to the departmental dose-optimization program which includes automated exposure control, adjustment of the mA and/or kV according to patient size and/or use of iterative reconstruction technique.  CONTRAST:  80mL OMNIPAQUE  IOHEXOL  300 MG/ML  SOLN  COMPARISON:  05/06/2024.  FINDINGS: CT CHEST FINDINGS  Cardiovascular: Left IJ Port-A-Cath terminates  in the low SVC. Heart size normal. No pericardial effusion.  Mediastinum/Nodes: No pathologically enlarged mediastinal, hilar, internal mammary or axillary lymph nodes. Esophagus is grossly unremarkable.  Lungs/Pleura: Likely focal subpleural atelectasis in the medial right lower lobe (6/114), new from 05/06/2024. New peribronchovascular nodularity and mucoid impaction in the anterior segment left upper lobe (6/48). Mild ill-defined centrilobular ground-glass nodularity, upper and midlung zone predominant. No pleural fluid. Airway is unremarkable.  Musculoskeletal: Degenerative changes in the spine. No worrisome lytic or sclerotic lesions.  CT ABDOMEN PELVIS FINDINGS  Hepatobiliary: Liver is unremarkable. Cholecystectomy. No unexpected biliary ductal dilatation.  Pancreas: Negative.  Spleen: Negative.  Adrenals/Urinary Tract: Adrenal glands and kidneys are unremarkable. Ureters are decompressed. Bladder is extremely low in volume.  Stomach/Bowel: Stomach and small bowel are unremarkable. Appendix is dilated and low-attenuation, measuring 1.3 x 5.1 cm (coronal image 75), as before. Associated appendicolith. Colon is unremarkable.  Vascular/Lymphatic: Vascular structures are unremarkable. No pathologically enlarged lymph nodes.  Reproductive: Uterus is visualized.  No adnexal  mass.  Other: Tiny pelvic free fluid, likely physiologic.  Musculoskeletal: Minimal degenerative change in the spine.  IMPRESSION: 1. No evidence of recurrent or metastatic disease. 2. Upper lung zone predominant ill-defined centrilobular ground-glass nodularity, mucoid impaction and focal peribronchovascular nodularity in the left upper lobe, findings which may be due to smoking related respiratory bronchiolitis. 3. Dilated low-attenuation appendix, as before, indicative of a mucocele.  Electronically Signed   By: Newell Eke M.D.   On: 08/27/2024 09:40     "

## 2024-09-08 ENCOUNTER — Inpatient Hospital Stay: Payer: MEDICAID

## 2024-09-08 ENCOUNTER — Inpatient Hospital Stay: Payer: MEDICAID | Attending: Hematology | Admitting: Oncology

## 2024-09-08 ENCOUNTER — Other Ambulatory Visit: Payer: Self-pay | Admitting: *Deleted

## 2024-09-08 VITALS — BP 121/81 | HR 93 | Temp 96.8°F | Resp 20

## 2024-09-08 VITALS — BP 105/71 | HR 80 | Temp 98.3°F | Resp 18 | Wt 131.2 lb

## 2024-09-08 DIAGNOSIS — R7989 Other specified abnormal findings of blood chemistry: Secondary | ICD-10-CM | POA: Diagnosis not present

## 2024-09-08 DIAGNOSIS — C7951 Secondary malignant neoplasm of bone: Secondary | ICD-10-CM | POA: Insufficient documentation

## 2024-09-08 DIAGNOSIS — D649 Anemia, unspecified: Secondary | ICD-10-CM | POA: Diagnosis not present

## 2024-09-08 DIAGNOSIS — G893 Neoplasm related pain (acute) (chronic): Secondary | ICD-10-CM | POA: Diagnosis not present

## 2024-09-08 DIAGNOSIS — C7931 Secondary malignant neoplasm of brain: Secondary | ICD-10-CM | POA: Diagnosis not present

## 2024-09-08 DIAGNOSIS — I159 Secondary hypertension, unspecified: Secondary | ICD-10-CM | POA: Diagnosis not present

## 2024-09-08 DIAGNOSIS — C787 Secondary malignant neoplasm of liver and intrahepatic bile duct: Secondary | ICD-10-CM | POA: Insufficient documentation

## 2024-09-08 DIAGNOSIS — F419 Anxiety disorder, unspecified: Secondary | ICD-10-CM

## 2024-09-08 DIAGNOSIS — Z1731 Human epidermal growth factor receptor 2 positive status: Secondary | ICD-10-CM | POA: Insufficient documentation

## 2024-09-08 DIAGNOSIS — Z5112 Encounter for antineoplastic immunotherapy: Secondary | ICD-10-CM | POA: Diagnosis present

## 2024-09-08 DIAGNOSIS — C50911 Malignant neoplasm of unspecified site of right female breast: Secondary | ICD-10-CM | POA: Diagnosis not present

## 2024-09-08 DIAGNOSIS — F1920 Other psychoactive substance dependence, uncomplicated: Secondary | ICD-10-CM

## 2024-09-08 LAB — URINE DRUG SCREEN
Amphetamines: NEGATIVE
Barbiturates: NEGATIVE
Benzodiazepines: POSITIVE — AB
Cocaine: NEGATIVE
Fentanyl: NEGATIVE
Methadone Scn, Ur: NEGATIVE
Opiates: NEGATIVE
Tetrahydrocannabinol: POSITIVE — AB

## 2024-09-08 LAB — CBC WITH DIFFERENTIAL/PLATELET
Abs Immature Granulocytes: 0.01 K/uL (ref 0.00–0.07)
Basophils Absolute: 0.1 K/uL (ref 0.0–0.1)
Basophils Relative: 1 %
Eosinophils Absolute: 0.2 K/uL (ref 0.0–0.5)
Eosinophils Relative: 2 %
HCT: 32.1 % — ABNORMAL LOW (ref 36.0–46.0)
Hemoglobin: 10.3 g/dL — ABNORMAL LOW (ref 12.0–15.0)
Immature Granulocytes: 0 %
Lymphocytes Relative: 21 %
Lymphs Abs: 1.5 K/uL (ref 0.7–4.0)
MCH: 33.4 pg (ref 26.0–34.0)
MCHC: 32.1 g/dL (ref 30.0–36.0)
MCV: 104.2 fL — ABNORMAL HIGH (ref 80.0–100.0)
Monocytes Absolute: 1 K/uL (ref 0.1–1.0)
Monocytes Relative: 14 %
Neutro Abs: 4.7 K/uL (ref 1.7–7.7)
Neutrophils Relative %: 62 %
Platelets: 276 K/uL (ref 150–400)
RBC: 3.08 MIL/uL — ABNORMAL LOW (ref 3.87–5.11)
RDW: 16.4 % — ABNORMAL HIGH (ref 11.5–15.5)
WBC: 7.5 K/uL (ref 4.0–10.5)
nRBC: 0 % (ref 0.0–0.2)

## 2024-09-08 LAB — COMPREHENSIVE METABOLIC PANEL WITH GFR
ALT: 49 U/L — ABNORMAL HIGH (ref 0–44)
AST: 71 U/L — ABNORMAL HIGH (ref 15–41)
Albumin: 4.1 g/dL (ref 3.5–5.0)
Alkaline Phosphatase: 145 U/L — ABNORMAL HIGH (ref 38–126)
Anion gap: 14 (ref 5–15)
BUN: 17 mg/dL (ref 6–20)
CO2: 24 mmol/L (ref 22–32)
Calcium: 9.7 mg/dL (ref 8.9–10.3)
Chloride: 101 mmol/L (ref 98–111)
Creatinine, Ser: 0.87 mg/dL (ref 0.44–1.00)
GFR, Estimated: >60 mL/min
Glucose, Bld: 90 mg/dL (ref 70–99)
Potassium: 4 mmol/L (ref 3.5–5.1)
Sodium: 140 mmol/L (ref 135–145)
Total Bilirubin: 0.3 mg/dL (ref 0.0–1.2)
Total Protein: 7.4 g/dL (ref 6.5–8.1)

## 2024-09-08 LAB — MAGNESIUM: Magnesium: 2.3 mg/dL (ref 1.7–2.4)

## 2024-09-08 MED ORDER — DEXAMETHASONE SODIUM PHOSPHATE 10 MG/ML IJ SOLN
10.0000 mg | Freq: Once | INTRAMUSCULAR | Status: AC
  Administered 2024-09-08: 10 mg via INTRAVENOUS

## 2024-09-08 MED ORDER — DEXTROSE 5 % IV SOLN: Freq: Once | INTRAVENOUS | Status: AC

## 2024-09-08 MED ORDER — ALPRAZOLAM 0.5 MG PO TABS: 0.5000 mg | ORAL_TABLET | Freq: Three times a day (TID) | ORAL | 0 refills | Status: AC | PRN

## 2024-09-08 MED ORDER — SODIUM CHLORIDE 0.9 % IV SOLN
150.0000 mg | Freq: Once | INTRAVENOUS | Status: AC
  Administered 2024-09-08: 150 mg via INTRAVENOUS
  Filled 2024-09-08: qty 150

## 2024-09-08 MED ORDER — FAM-TRASTUZUMAB DERUXTECAN-NXKI CHEMO 100 MG IV SOLR
5.4000 mg/kg | Freq: Once | INTRAVENOUS | Status: AC
  Administered 2024-09-08: 340 mg via INTRAVENOUS
  Filled 2024-09-08: qty 17

## 2024-09-08 MED ORDER — OXYCODONE HCL 5 MG PO TABS: 10.0000 mg | ORAL_TABLET | Freq: Four times a day (QID) | ORAL | 0 refills | Status: AC | PRN

## 2024-09-08 MED ORDER — FAMOTIDINE IN NACL 20-0.9 MG/50ML-% IV SOLN
20.0000 mg | Freq: Once | INTRAVENOUS | Status: AC
  Administered 2024-09-08: 20 mg via INTRAVENOUS
  Filled 2024-09-08: qty 50

## 2024-09-08 MED ORDER — PALONOSETRON HCL INJECTION 0.25 MG/5ML
0.2500 mg | Freq: Once | INTRAVENOUS | Status: AC
  Administered 2024-09-08: 0.25 mg via INTRAVENOUS
  Filled 2024-09-08: qty 5

## 2024-09-08 NOTE — Progress Notes (Signed)
 Patient tolerated therapy with no complaints voiced.  Side effects with management reviewed with understanding verbalized.  Port site clean and dry with no bruising or swelling noted at site.  Good blood return noted before and after administration of therapy.  Band aid applied.  Patient left in satisfactory condition with VSS and no s/s of distress noted.

## 2024-09-08 NOTE — Patient Instructions (Signed)
 [facility] at [facility] Discharge Instructions   You were seen and examined today by Dr. Davonna.  She reviewed the results of your lab work which are normal/stable.   She reviewed the results of your most recent CT scan which did not show any evidence of cancer.   We will proceed with your treatment today.   Return as scheduled.    Thank you for choosing [facility] at [facility] to provide your oncology and hematology care.  To afford each patient quality time with our provider, please arrive at least 15 minutes before your scheduled appointment time.   If you have a lab appointment with the Cancer Center please come in thru the Main Entrance and check in at the main information desk.  You need to re-schedule your appointment should you arrive 10 or more minutes late.  We strive to give you quality time with our providers, and arriving late affects you and other patients whose appointments are after yours.  Also, if you no show three or more times for appointments you may be dismissed from the clinic at the providers discretion.     Again, thank you for choosing [facility].  Our hope is that these requests will decrease the amount of time that you wait before being seen by our physicians.       _____________________________________________________________  Should you have questions after your visit to [facility], please contact our office at [phone] and follow the prompts.  Our office hours are 8:00 a.m. and 4:30 p.m. Monday - Friday.  Please note that voicemails left after 4:00 p.m. may not be returned until the following business day.  We are closed weekends and major holidays.  You do have access to a nurse 24-7, just call the main number to the clinic [phone] and do not press any options, hold on the line and a nurse will answer the phone.    For prescription refill requests, have your pharmacy  contact our office and allow 72 hours.    Due to Covid, you will need to wear a mask upon entering the hospital. If you do not have a mask, a mask will be given to you at the Main Entrance upon arrival. For doctor visits, patients may have 1 support person age 28 or older with them. For treatment visits, patients can not have anyone with them due to social distancing guidelines and our immunocompromised population.

## 2024-09-08 NOTE — Patient Instructions (Signed)
 [facility] [place] - A DEPT OF [facility]  Discharge Instructions: Thank you for choosing [facility] to provide your oncology and hematology care.  If you have a lab appointment with the Cancer Center - please note that after April 8th, 2024, all labs will be drawn in the cancer center.  You do not have to check in or register with the main entrance as you have in the past but will complete your check-in in the cancer center.  Wear comfortable clothing and clothing appropriate for easy access to any Portacath or PICC line.   We strive to give you quality time with your provider. You may need to reschedule your appointment if you arrive late (15 or more minutes).  Arriving late affects you and other patients whose appointments are after yours.  Also, if you miss three or more appointments without notifying the office, you may be dismissed from the clinic at the provider's discretion.      For prescription refill requests, have your pharmacy contact our office and allow 72 hours for refills to be completed.    Today you received the following chemotherapy and/or immunotherapy agents Enhertu, return as scheduled.   To help prevent nausea and vomiting after your treatment, we encourage you to take your nausea medication as directed.  BELOW ARE SYMPTOMS THAT SHOULD BE REPORTED IMMEDIATELY: *FEVER GREATER THAN 100.4 F (38 C) OR HIGHER *CHILLS OR SWEATING *NAUSEA AND VOMITING THAT IS NOT CONTROLLED WITH YOUR NAUSEA MEDICATION *UNUSUAL SHORTNESS OF BREATH *UNUSUAL BRUISING OR BLEEDING *URINARY PROBLEMS (pain or burning when urinating, or frequent urination) *BOWEL PROBLEMS (unusual diarrhea, constipation, pain near the anus) TENDERNESS IN MOUTH AND THROAT WITH OR WITHOUT PRESENCE OF ULCERS (sore throat, sores in mouth, or a toothache) UNUSUAL RASH, SWELLING OR PAIN  UNUSUAL VAGINAL DISCHARGE OR ITCHING   Items with * indicate a potential emergency and  should be followed up as soon as possible or go to the Emergency Department if any problems should occur.  Please show the CHEMOTHERAPY ALERT CARD or IMMUNOTHERAPY ALERT CARD at check-in to the Emergency Department and triage nurse.  Should you have questions after your visit or need to cancel or reschedule your appointment, please contact [facility] [place] - A DEPT OF [facility] [phone]  and follow the prompts.  Office hours are 8:00 a.m. to 4:30 p.m. Monday - Friday. Please note that voicemails left after 4:00 p.m. may not be returned until the following business day.  We are closed weekends and major holidays. You have access to a nurse at all times for urgent questions. Please call the main number to the clinic [phone] and follow the prompts.  For any non-urgent questions, you may also contact your provider using MyChart. We now offer e-Visits for anyone 51 and older to request care online for non-urgent symptoms. For details visit [email or web address].   Also download the MyChart app! Go to the app store, search "MyChart", open the app, select [place], and log in with your MyChart username and password.

## 2024-09-08 NOTE — Progress Notes (Signed)
 Patient has been examined by Dr. Davonna. Vital signs and labs have been reviewed by MD - ANC, Creatinine, LFTs, hemoglobin, and platelets have been reviewed by M.D. - pt may proceed with treatment.  Primary RN and pharmacy notified.

## 2024-09-09 ENCOUNTER — Other Ambulatory Visit: Payer: Self-pay

## 2024-09-09 LAB — CANCER ANTIGEN 15-3: CA 15-3: 25.3 U/mL — ABNORMAL HIGH (ref 0.0–25.0)

## 2024-09-09 LAB — CANCER ANTIGEN 27.29: CA 27.29: 31.9 U/mL (ref 0.0–38.6)

## 2024-09-23 NOTE — Addendum Note (Signed)
 Encounter addended by: Alyxis Grippi, PA-C on: 09/23/2024 1:52 PM  Actions taken: Clinical Note Signed, Visit diagnoses modified

## 2024-09-29 ENCOUNTER — Inpatient Hospital Stay: Payer: MEDICAID

## 2024-09-29 ENCOUNTER — Inpatient Hospital Stay: Payer: MEDICAID | Attending: Hematology

## 2024-10-13 ENCOUNTER — Other Ambulatory Visit (HOSPITAL_COMMUNITY): Payer: MEDICAID

## 2024-10-20 ENCOUNTER — Inpatient Hospital Stay: Payer: MEDICAID | Admitting: Oncology

## 2024-10-20 ENCOUNTER — Inpatient Hospital Stay: Payer: MEDICAID

## 2024-10-20 ENCOUNTER — Inpatient Hospital Stay: Payer: MEDICAID | Attending: Hematology

## 2024-11-02 ENCOUNTER — Ambulatory Visit: Payer: MEDICAID | Admitting: Urology

## 2024-11-02 ENCOUNTER — Other Ambulatory Visit (HOSPITAL_COMMUNITY): Payer: MEDICAID

## 2024-11-09 ENCOUNTER — Ambulatory Visit: Payer: MEDICAID | Admitting: Urology

## 2024-11-10 ENCOUNTER — Inpatient Hospital Stay: Payer: MEDICAID | Admitting: Oncology

## 2024-11-10 ENCOUNTER — Inpatient Hospital Stay: Payer: MEDICAID
